# Patient Record
Sex: Female | Born: 1955 | Race: White | Hispanic: Yes | Smoking: Never smoker
Health system: Southern US, Community
[De-identification: ages and names within clinical notes are randomized; demographics above are authoritative.]

## PROBLEM LIST (undated history)

## (undated) DIAGNOSIS — R112 Nausea with vomiting, unspecified: Secondary | ICD-10-CM

## (undated) DIAGNOSIS — A419 Sepsis, unspecified organism: Secondary | ICD-10-CM

## (undated) DIAGNOSIS — E119 Type 2 diabetes mellitus without complications: Secondary | ICD-10-CM

## (undated) DIAGNOSIS — T7840XA Allergy, unspecified, initial encounter: Secondary | ICD-10-CM

## (undated) DIAGNOSIS — C50919 Malignant neoplasm of unspecified site of unspecified female breast: Secondary | ICD-10-CM

## (undated) DIAGNOSIS — Z9289 Personal history of other medical treatment: Secondary | ICD-10-CM

## (undated) DIAGNOSIS — K4090 Unilateral inguinal hernia, without obstruction or gangrene, not specified as recurrent: Secondary | ICD-10-CM

## (undated) DIAGNOSIS — F419 Anxiety disorder, unspecified: Secondary | ICD-10-CM

## (undated) DIAGNOSIS — K5792 Diverticulitis of intestine, part unspecified, without perforation or abscess without bleeding: Secondary | ICD-10-CM

## (undated) DIAGNOSIS — M1990 Unspecified osteoarthritis, unspecified site: Secondary | ICD-10-CM

## (undated) DIAGNOSIS — R6521 Severe sepsis with septic shock: Secondary | ICD-10-CM

## (undated) DIAGNOSIS — Z9889 Other specified postprocedural states: Secondary | ICD-10-CM

## (undated) DIAGNOSIS — D649 Anemia, unspecified: Secondary | ICD-10-CM

## (undated) DIAGNOSIS — Z973 Presence of spectacles and contact lenses: Secondary | ICD-10-CM

## (undated) HISTORY — DX: Allergy, unspecified, initial encounter: T78.40XA

## (undated) HISTORY — DX: Sepsis, unspecified organism: A41.9

## (undated) HISTORY — DX: Malignant neoplasm of unspecified site of unspecified female breast: C50.919

## (undated) HISTORY — DX: Unilateral inguinal hernia, without obstruction or gangrene, not specified as recurrent: K40.90

## (undated) HISTORY — DX: Diverticulitis of intestine, part unspecified, without perforation or abscess without bleeding: K57.92

## (undated) HISTORY — DX: Severe sepsis with septic shock: R65.21

## (undated) HISTORY — PX: PORT-A-CATH REMOVAL: SHX5289

---

## 2002-12-23 HISTORY — PX: COLON RESECTION: SHX5231

## 2003-12-24 HISTORY — PX: CHOLECYSTECTOMY: SHX55

## 2003-12-24 HISTORY — PX: HERNIA REPAIR: SHX51

## 2013-12-16 DIAGNOSIS — C50919 Malignant neoplasm of unspecified site of unspecified female breast: Secondary | ICD-10-CM

## 2013-12-16 HISTORY — DX: Malignant neoplasm of unspecified site of unspecified female breast: C50.919

## 2013-12-23 HISTORY — PX: AXILLARY LYMPH NODE BIOPSY: SHX5737

## 2013-12-23 HISTORY — PX: BREAST BIOPSY: SHX20

## 2014-01-03 ENCOUNTER — Other Ambulatory Visit: Payer: Self-pay | Admitting: Oncology

## 2014-01-06 ENCOUNTER — Telehealth: Payer: Self-pay | Admitting: *Deleted

## 2014-01-06 ENCOUNTER — Encounter: Payer: Self-pay | Admitting: *Deleted

## 2014-01-06 NOTE — Telephone Encounter (Signed)
Received appt date and time from Dr. Humphrey Rolls.  Called and confirmed 01/10/14 appt w/ pt.  Mailed before appt letter, welcome packet & intake form to pt.  Called Regina at referring to make her aware, but office was already closed so I will call her to make her aware in the am.  Took paperwork to Med Rec for chart.

## 2014-01-06 NOTE — Progress Notes (Signed)
Received a call from [facility] at referring requesting an urgent appt for this pt.  She informed me that Tiffany had the paperwork . Went to [employer] and retrieved paperwork and went over it with [employer].  Took paperwork to Dr. Humphrey Rolls for an appt date and time.  Called Rollene Fare back to make her aware that I would call her with an update once I have received appt info from physician and I have confirmed the appt w/ the pt.

## 2014-01-07 ENCOUNTER — Other Ambulatory Visit: Payer: Self-pay | Admitting: Emergency Medicine

## 2014-01-07 ENCOUNTER — Encounter: Payer: Self-pay | Admitting: *Deleted

## 2014-01-07 DIAGNOSIS — C50919 Malignant neoplasm of unspecified site of unspecified female breast: Secondary | ICD-10-CM

## 2014-01-07 NOTE — Progress Notes (Signed)
Received chart back from Dawn and placed in Dr. Khan's box. 

## 2014-01-07 NOTE — Progress Notes (Signed)
Called referring facility and requested office note to be faxed.  Placed a note on the chart.  Added to Spreadsheet.  Completed chart and gave to [facility] to enter labs and return to me.

## 2014-01-10 ENCOUNTER — Ambulatory Visit: Payer: BC Managed Care – PPO

## 2014-01-10 ENCOUNTER — Telehealth: Payer: Self-pay | Admitting: Oncology

## 2014-01-10 ENCOUNTER — Encounter: Payer: Self-pay | Admitting: Oncology

## 2014-01-10 ENCOUNTER — Other Ambulatory Visit (HOSPITAL_BASED_OUTPATIENT_CLINIC_OR_DEPARTMENT_OTHER): Payer: BC Managed Care – PPO

## 2014-01-10 ENCOUNTER — Ambulatory Visit (HOSPITAL_BASED_OUTPATIENT_CLINIC_OR_DEPARTMENT_OTHER): Payer: BC Managed Care – PPO | Admitting: Oncology

## 2014-01-10 ENCOUNTER — Encounter (INDEPENDENT_AMBULATORY_CARE_PROVIDER_SITE_OTHER): Payer: Self-pay

## 2014-01-10 VITALS — BP 138/86 | HR 99 | Temp 98.6°F | Resp 18 | Ht 64.0 in | Wt 209.1 lb

## 2014-01-10 DIAGNOSIS — M549 Dorsalgia, unspecified: Secondary | ICD-10-CM

## 2014-01-10 DIAGNOSIS — I10 Essential (primary) hypertension: Secondary | ICD-10-CM | POA: Insufficient documentation

## 2014-01-10 DIAGNOSIS — Z170 Estrogen receptor positive status [ER+]: Secondary | ICD-10-CM

## 2014-01-10 DIAGNOSIS — C773 Secondary and unspecified malignant neoplasm of axilla and upper limb lymph nodes: Secondary | ICD-10-CM

## 2014-01-10 DIAGNOSIS — C50919 Malignant neoplasm of unspecified site of unspecified female breast: Secondary | ICD-10-CM

## 2014-01-10 DIAGNOSIS — C50519 Malignant neoplasm of lower-outer quadrant of unspecified female breast: Secondary | ICD-10-CM

## 2014-01-10 DIAGNOSIS — G8929 Other chronic pain: Secondary | ICD-10-CM

## 2014-01-10 LAB — COMPREHENSIVE METABOLIC PANEL (CC13)
ALT: 17 U/L (ref 0–55)
AST: 15 U/L (ref 5–34)
Albumin: 3.7 g/dL (ref 3.5–5.0)
Alkaline Phosphatase: 95 U/L (ref 40–150)
Anion Gap: 13 mEq/L — ABNORMAL HIGH (ref 3–11)
BUN: 18.2 mg/dL (ref 7.0–26.0)
CALCIUM: 9.9 mg/dL (ref 8.4–10.4)
CHLORIDE: 103 meq/L (ref 98–109)
CO2: 27 mEq/L (ref 22–29)
CREATININE: 0.8 mg/dL (ref 0.6–1.1)
Glucose: 107 mg/dl (ref 70–140)
Potassium: 3.5 mEq/L (ref 3.5–5.1)
Sodium: 142 mEq/L (ref 136–145)
Total Bilirubin: 0.37 mg/dL (ref 0.20–1.20)
Total Protein: 7.5 g/dL (ref 6.4–8.3)

## 2014-01-10 LAB — CBC WITH DIFFERENTIAL/PLATELET
BASO%: 0.7 % (ref 0.0–2.0)
BASOS ABS: 0.1 10*3/uL (ref 0.0–0.1)
EOS%: 5.4 % (ref 0.0–7.0)
Eosinophils Absolute: 0.5 10*3/uL (ref 0.0–0.5)
HEMATOCRIT: 41 % (ref 34.8–46.6)
HEMOGLOBIN: 13.6 g/dL (ref 11.6–15.9)
LYMPH#: 1.3 10*3/uL (ref 0.9–3.3)
LYMPH%: 13 % — ABNORMAL LOW (ref 14.0–49.7)
MCH: 28.5 pg (ref 25.1–34.0)
MCHC: 33.2 g/dL (ref 31.5–36.0)
MCV: 86 fL (ref 79.5–101.0)
MONO#: 0.5 10*3/uL (ref 0.1–0.9)
MONO%: 4.5 % (ref 0.0–14.0)
NEUT#: 7.7 10*3/uL — ABNORMAL HIGH (ref 1.5–6.5)
NEUT%: 76.4 % (ref 38.4–76.8)
Platelets: 294 10*3/uL (ref 145–400)
RBC: 4.77 10*6/uL (ref 3.70–5.45)
RDW: 14 % (ref 11.2–14.5)
WBC: 10.1 10*3/uL (ref 3.9–10.3)

## 2014-01-10 NOTE — Progress Notes (Signed)
Madeline Davidson [id number] 1956-05-13 58 y.o. 01/10/2014 3:24 PM  CC  Lonzo Cloud, MD [address] [place] [address] Wendy Rodriguez NP  REASON FOR CONSULTATION:  58 year old female with new diagnosis of right breast cancer in the lower outer quadrant diagnosed December 2014.   STAGE:   Breast cancer   Primary site: Breast (Right)   Staging method: AJCC 7th Edition   Clinical: Stage IIB (T2, N1, cM0) signed by [name], MD on 01/10/2014  5:01 PM   Summary: Stage IIB (T2, N1, cM0)   REFERRING PHYSICIAN: Dr. Lonzo Cloud  HISTORY OF PRESENT ILLNESS:  Madeline Davidson is a 58 y.o. female.  Was last normal mammogram was back in 2005. Patient on 12/16/2013 felt a lump in her right breast. She states it was nickel size. Because of this she 1 on to see her nurse practitioner. On 12/31/2013 a mammogram was ordered. This showed a lesion in the 7:00 position measuring 3.3 cm. She was also noted to have enlarged lymph nodes. Subsequent ultrasound showed a 2.2 x 2.4 cm mass with enlarged lymph node measuring 1.6 cm. This was biopsied in [place]. 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 Lymph node was positive for metastatic disease. Prognostic markers revealed the tumor to be estrogen receptor +90% progesterone receptor 1% HER-2/neu positive. Patient subsequently was referred to medical oncology for further treatment planning. Patient is complaining of having tenderness at the biopsy site. Otherwise no complaints.   Past Medical History: Past Medical History  Diagnosis Date  . Breast cancer 12/16/13    self palpated  . Hypertension   . Allergy   . Hypertension 01/10/2014    Past Surgical History: Past Surgical History  Procedure Laterality Date  . Cholecystectomy    . Hernia repair    . Colon resection      Family History: Family History  Problem Relation Age of Onset  . Cancer Father 80    colon cancer    Social History History  Substance  Use Topics  . Smoking status: Never Smoker   . Smokeless tobacco: Never Used  . Alcohol Use: No    Allergies: No known drug allergy Not on File  Current Medications: Current outpatient prescriptions:cetirizine (ZYRTEC) 10 MG tablet, Take 10 mg by mouth daily., Disp: , Rfl: ;  clonazePAM (KLONOPIN) 0.5 MG tablet, Take 0.5 mg by mouth as needed for anxiety (take half a tablet as needed)., Disp: , Rfl: ;  ibuprofen (ADVIL,MOTRIN) 800 MG tablet, Take 800 mg by mouth as needed., Disp: , Rfl: ;  lisinopril-hydrochlorothiazide (PRINZIDE,ZESTORETIC) 10-12.5 MG per tablet, Take 1 tablet by mouth daily., Disp: , Rfl:  zolpidem (AMBIEN) 10 MG tablet, Take 10 mg by mouth at bedtime as needed for sleep (take half a tablet at night)., Disp: , Rfl:    OB/GYN History: menarche at 51, post-menpause 2007, no HRT, age first 41 x 1  Fertility Discussion: N/A Prior History of Cancer: no  Health Maintenance:  Colonoscopy no Bone Density no Last PAP smear no Last normal mammo: 2005  ECOG PERFORMANCE STATUS: 0 - Asymptomatic  Genetic Counseling/testing: NO  REVIEW OF SYSTEMS:  A comprehensive review of systems was negative. Patient does have some sinus trouble she does wear glasses the lump is somewhat painful since the biopsy and she has chronic back problems appear  PHYSICAL EXAMINATION: Blood pressure 138/86, pulse 99, temperature 98.6 F (37 C), temperature source Oral, resp. rate 18, height $RemoveBe'5\' 4"'PrieSgCuU$  (1.626 m), weight 209 lb 1.6 oz (  94.847 kg).  GMW:NUUVO, healthy, no distress, well nourished and well developed SKIN: skin color, texture, turgor are normal HEAD: Normocephalic, No masses, lesions, tenderness or abnormalities, Atopic facies- EYES: PERRLA, EOMI, Conjunctiva are pink and non-injected, sclera clear EARS: External ears normal OROPHARYNX:no exudate, no erythema and lips, buccal mucosa, and tongue normal  NECK: supple, no adenopathy LYMPH:  no palpable lymphadenopathy, no  hepatosplenomegaly BREAST:left breast normal without mass, skin or nipple changes or axillary nodes,  abnormal mass palpable in the lower outer quadrant of the right breast. LUNGS: clear to auscultation and percussion HEART: regular rate & rhythm ABDOMEN:abdomen soft, non-tender, normal bowel sounds and no masses or organomegaly BACK: Back symmetric, no curvature., No CVA tenderness EXTREMITIES:no edema, no clubbing, no cyanosis  NEURO: alert & oriented x 3 with fluent speech, no focal motor/sensory deficits, gait normal     STUDIES/RESULTS: No results found.   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 Mammogram and ultrasound confirmed the mass to be 3.3 cm. The node was positive on biopsy in the right axilla. Tumor was ER positive PR positive HER-2/neu positive, grade 3. Patient's biopsy was performed in [place] and we will need to review her slides. We will ask for slides from [place].  #2 patient and I discussed in detail her pathology and radiology today. We discussed the pathophysiology of breast cancer. We discussed different treatment options including lumpectomy versus mastectomy. We discussed systemic treatment either neoadjuvant or adjuvant. I do think that the patient would be a good candidate for neoadjuvant chemotherapy and anti-HER-2 therapy. We certainly could give her [facility] with Perjeta in in the neoadjuvant setting. We discussed the rationale for this. We discussed risks benefits and side effects of chemotherapy.  #3 patient has not been seen by  surgery or radiation oncology we will make referrals for this. Patient will also need MRI of the breasts for treatment planning. She does need staging studies including PET CT to look for any evidence of distant recurrence since she is at risk for early metastasis.  #4 because patient will be receiving anti-HER-2 therapy she will need an echocardiogram and referral to cardiology. I will make these appointments for her.    PLAN:    #1 MRI of bilateral breasts, staging PET/CT  #2 surgical and radiation oncology consultation  #3 pathology review.  #4 chemotherapy class echocardiogram and cardiology consultation he eventually prior to beginning treatment       Discussion: Patient is being treated per NCCN breast cancer care guidelines appropriate for stage.II   Thank you so much for allowing me to participate in the care of [facility]. I will continue to follow up the patient with you and assist in her care.  All questions were answered. The patient knows to call the clinic with any problems, questions or concerns. We can certainly see the patient much sooner if necessary.  I spent 60 minutes counseling the patient face to face. The total time spent in the appointment was  60 minutes.  Marcy Panning, MD Medical/Oncology [facility] [phone] (beeper) [phone] (Office)  01/10/2014, 3:25 PM

## 2014-01-10 NOTE — Progress Notes (Signed)
Checked in new pt with no financial concerns. °

## 2014-01-14 ENCOUNTER — Encounter (HOSPITAL_COMMUNITY): Payer: Self-pay

## 2014-01-17 ENCOUNTER — Telehealth: Payer: Self-pay | Admitting: *Deleted

## 2014-01-17 ENCOUNTER — Telehealth (INDEPENDENT_AMBULATORY_CARE_PROVIDER_SITE_OTHER): Payer: Self-pay

## 2014-01-17 ENCOUNTER — Telehealth: Payer: Self-pay | Admitting: Oncology

## 2014-01-17 NOTE — Telephone Encounter (Signed)
Faxed pt medical records to Dr. Raynelle Jan

## 2014-01-17 NOTE — Telephone Encounter (Signed)
LMOM for pt to call me back b/c Dr Donne Hazel is having to cancel his office for 01/21/14 so I r/s her appt from 1/30 to 1/29 arrive at 2:00/2:30. I tried calling pt at work but she did not work today. I need to give the pt this information about the appt change. Dr Donne Hazel is having to do surgery now on 1/30 instead of an office.

## 2014-01-17 NOTE — Telephone Encounter (Signed)
, °

## 2014-01-17 NOTE — Telephone Encounter (Signed)
Faxed Care Plan to the PCP.  Took care plan to med rec to scan.

## 2014-01-20 ENCOUNTER — Ambulatory Visit (HOSPITAL_COMMUNITY)
Admission: RE | Admit: 2014-01-20 | Discharge: 2014-01-20 | Disposition: A | Discharge status: Home / Self Care | Payer: BC Managed Care – PPO | Source: Ambulatory Visit | Attending: Oncology | Admitting: Oncology

## 2014-01-20 ENCOUNTER — Encounter (INDEPENDENT_AMBULATORY_CARE_PROVIDER_SITE_OTHER): Payer: Self-pay | Admitting: General Surgery

## 2014-01-20 ENCOUNTER — Encounter (HOSPITAL_COMMUNITY)
Admission: RE | Admit: 2014-01-20 | Discharge: 2014-01-20 | Disposition: A | Discharge status: Home / Self Care | Payer: BC Managed Care – PPO | Source: Ambulatory Visit | Attending: Oncology | Admitting: Oncology

## 2014-01-20 ENCOUNTER — Encounter: Payer: Self-pay | Admitting: Radiation Oncology

## 2014-01-20 ENCOUNTER — Ambulatory Visit (INDEPENDENT_AMBULATORY_CARE_PROVIDER_SITE_OTHER): Payer: BC Managed Care – PPO | Admitting: General Surgery

## 2014-01-20 ENCOUNTER — Encounter (HOSPITAL_COMMUNITY): Payer: Self-pay

## 2014-01-20 VITALS — BP 126/78 | HR 96 | Temp 98.4°F | Resp 14 | Ht 64.0 in | Wt 206.8 lb

## 2014-01-20 DIAGNOSIS — C778 Secondary and unspecified malignant neoplasm of lymph nodes of multiple regions: Secondary | ICD-10-CM | POA: Insufficient documentation

## 2014-01-20 DIAGNOSIS — C50919 Malignant neoplasm of unspecified site of unspecified female breast: Secondary | ICD-10-CM | POA: Insufficient documentation

## 2014-01-20 DIAGNOSIS — K439 Ventral hernia without obstruction or gangrene: Secondary | ICD-10-CM | POA: Insufficient documentation

## 2014-01-20 LAB — GLUCOSE, CAPILLARY: Glucose-Capillary: 99 mg/dL (ref 70–99)

## 2014-01-20 MED ORDER — IOHEXOL 300 MG/ML  SOLN
100.0000 mL | Freq: Once | INTRAMUSCULAR | Status: AC | PRN
  Administered 2014-01-20: 100 mL via INTRAVENOUS

## 2014-01-20 MED ORDER — FLUDEOXYGLUCOSE F - 18 (FDG) INJECTION
17.6000 | Freq: Once | INTRAVENOUS | Status: AC | PRN
  Administered 2014-01-20: 17.6 via INTRAVENOUS

## 2014-01-20 NOTE — Progress Notes (Signed)
Patient ID: Madeline Davidson, female   DOB: [birth year], 59 y.o.   MRN: [id number]  Chief Complaint  Patient presents with  . New Evaluation    New Rt br cancer    HPI Madeline Davidson is a 58 y.o. female.  Referred by Dr Marcy Panning HPI 69 yof who lives in [place] and presents after noting a right breast mass around Christmas.  She underwent mm that showed a 3.3 cm lesion in the right breast at 7 oclock.  She also had enlarged right axillary node that was biopsied.  An ultrasound showed a 2.2x2.4 cm mass with a node measuring 1.6 cm   The pathology showed an invasive ductal carcinoma with metastatic carcinoma in the node.  This is er positive at 90%, pr at 1%, her2 is amplified. She comes in after seeing med onc for surgical evaluation.  Past Medical History  Diagnosis Date  . Breast cancer 12/16/13    self palpated  . Hypertension   . Allergy   . Hypertension 01/10/2014    Past Surgical History  Procedure Laterality Date  . Cholecystectomy    . Hernia repair    . Colon resection      Family History  Problem Relation Age of Onset  . Cancer Father 96    colon cancer  . Hypertension Mother     Social History History  Substance Use Topics  . Smoking status: Never Smoker   . Smokeless tobacco: Never Used  . Alcohol Use: No    Allergies  Allergen Reactions  . Ciprofloxacin Other (See Comments)    Patient had a headache after taking Cipro. Made her sinus infection symptoms worse.    Current Outpatient Prescriptions  Medication Sig Dispense Refill  . cetirizine (ZYRTEC) 10 MG tablet Take 10 mg by mouth daily.      . clonazePAM (KLONOPIN) 0.5 MG tablet Take 0.5 mg by mouth as needed for anxiety (take half a tablet as needed).      Marland Kitchen ibuprofen (ADVIL,MOTRIN) 800 MG tablet Take 800 mg by mouth as needed.      Marland Kitchen lisinopril-hydrochlorothiazide (PRINZIDE,ZESTORETIC) 10-12.5 MG per tablet Take 1 tablet by mouth daily.      Marland Kitchen zolpidem (AMBIEN) 10 MG tablet Take 10 mg by  mouth at bedtime as needed for sleep (take half a tablet at night).       No current facility-administered medications for this visit.    Review of Systems Review of Systems  Constitutional: Negative for fever, chills and unexpected weight change.  HENT: Negative for congestion, hearing loss, sore throat, trouble swallowing and voice change.   Eyes: Negative for visual disturbance.  Respiratory: Negative for cough and wheezing.   Cardiovascular: Negative for chest pain, palpitations and leg swelling.  Gastrointestinal: Negative for nausea, vomiting, abdominal pain, diarrhea, constipation, blood in stool, abdominal distention and anal bleeding.  Genitourinary: Negative for hematuria, vaginal bleeding and difficulty urinating.  Musculoskeletal: Negative for arthralgias.  Skin: Negative for rash and wound.  Neurological: Negative for seizures, syncope and headaches.  Hematological: Negative for adenopathy. Does not bruise/bleed easily.  Psychiatric/Behavioral: Negative for confusion.    Blood pressure 126/78, pulse 96, temperature 98.4 F (36.9 C), temperature source Temporal, resp. rate 14, height $RemoveBe'5\' 4"'kGMJkOnYg$  (1.626 m), weight 206 lb 12.8 oz (93.804 kg).  Physical Exam Physical Exam  Vitals reviewed. Constitutional: She appears well-developed and well-nourished.  Neck: Neck supple.  Cardiovascular: Normal rate, regular rhythm and normal heart sounds.   Pulmonary/Chest: Effort normal and breath  sounds normal. She has no wheezes. She has no rales. Right breast exhibits mass. Right breast exhibits no inverted nipple, no nipple discharge, no skin change and no tenderness. Left breast exhibits no inverted nipple, no mass, no nipple discharge, no skin change and no tenderness.    Abdominal: Soft. A hernia is present. Hernia confirmed positive in the ventral area.    Lymphadenopathy:    She has no cervical adenopathy.    She has axillary adenopathy.       Right axillary: Lateral adenopathy  present.       Right: No supraclavicular adenopathy present.       Left: No supraclavicular adenopathy present.    Data Reviewed Path, mm, [place] reviewed  Assessment    Clinical stage II right breast cancer     Plan    Staging studies and MRI pending as of now. We discussed the staging and pathophysiology of breast cancer. We discussed all of the different options for treatment for breast cancer including surgery, chemotherapy, radiation therapy, Herceptin, and antiestrogen therapy.  I think beginning with primary systemic therapy will improve chances of breast conservation therapy and may be able to enroll in node study in patients undergoing neoadjuvant therapy.  She certainly needs staging studies first to see if this is curable disease also. We discussed lumpectomy/mastectomy.  Will await mr and staging studies. I will plan on placing a port and we discussed performance of this and risks associated with it.       Madeline Davidson 01/20/2014, 3:26 PM

## 2014-01-21 ENCOUNTER — Ambulatory Visit
Admission: RE | Admit: 2014-01-21 | Discharge: 2014-01-21 | Disposition: A | Discharge status: Home / Self Care | Payer: BC Managed Care – PPO | Source: Ambulatory Visit | Attending: Radiation Oncology | Admitting: Radiation Oncology

## 2014-01-21 ENCOUNTER — Encounter (INDEPENDENT_AMBULATORY_CARE_PROVIDER_SITE_OTHER): Payer: Self-pay | Admitting: General Surgery

## 2014-01-21 DIAGNOSIS — C50919 Malignant neoplasm of unspecified site of unspecified female breast: Secondary | ICD-10-CM

## 2014-01-21 NOTE — Progress Notes (Signed)
Radiation Oncology         [phone]) [phone] ________________________________  Initial outpatient Consultation - Date: 01/21/2014   Name: Madeline Davidson MRN: [id number]   DOB: 02-01-1956  REFERRING PHYSICIAN: [name], MD  DIAGNOSIS: No diagnosis found.  STAGE: Breast cancer   Primary site: Breast (Right)   Staging method: AJCC 7th Edition   Clinical: Stage IIB (T2, N1, cM0) signed by [name], MD on 01/10/2014  5:01 PM   Summary: Stage IIB (T2, N1, cM0)   Prognostic indicators: ER/PR/HER2NEU +   HISTORY OF PRESENT ILLNESS::Madeline Davidson is a 58 y.o. female  who palpated a right breast mass after noticing her right arm with sore.. She brought this to the attention of her primary care physician who ordered a mammogram. It a bit about 5 years since her previous mammogram. This showed a spiculated lesion at the 7:00 position of the right breast. This measured about 3.3 cm. Enlarged lymph nodes were also noted in the upper outer quadrant of the breast. The largest lymph node measured 19 mm. Asymmetry was seen within the left breast centrally. Ultrasound showed a hypoechoic lesion at the 7:00 position measuring 2.4 x 2.4 cm with several enlarged lymph nodes in the upper outer quadrant the largest measuring 1.6 cm. 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 The outside report showed the tumor positive for estrogen at 90% and progesterone 1%. She was also HER-2 positive. As far as I can see no further workup the left breast was performed. A CT and PET have been ordered by Dr. Humphrey Rolls and those results are pending. She has been scheduled for an MRI of the bilateral breasts and port placement. Dr. Donne Hazel has discussed neoadjuvant chemotherapy with her as has Dr. Humphrey Rolls. She is present with her daughter today. She reports some soreness in her right breast especially when she wakes up in the morning.  She reports no Donald pain tenderness nausea or headaches. She has no personal history of breast cancer. She is GX P1 with menarche at 44 and is postmenopausal. She still has her uterus and ovaries. She has not been on hormone replacement.  PREVIOUS RADIATION THERAPY: No  PAST MEDICAL HISTORY:  has a past medical history of Breast cancer (12/16/13); Hypertension; Allergy; and Hypertension (01/10/2014).    PAST SURGICAL HISTORY: Past Surgical History  Procedure Laterality Date  . Cholecystectomy    . Hernia repair    . Colon resection      FAMILY HISTORY:  Family History  Problem Relation Age of Onset  . Cancer Father 42    colon cancer  . Hypertension Mother     SOCIAL HISTORY:  History  Substance Use Topics  . Smoking status: Never Smoker   . Smokeless tobacco: Never Used  . Alcohol Use: No    ALLERGIES: Ciprofloxacin  MEDICATIONS:  Current Outpatient Prescriptions  Medication Sig Dispense Refill  . cetirizine (ZYRTEC) 10 MG tablet Take 10 mg by mouth daily.      . clonazePAM (KLONOPIN) 0.5 MG tablet Take 0.5 mg by mouth as needed for anxiety (take half a tablet as needed).      Marland Kitchen ibuprofen (ADVIL,MOTRIN) 800 MG tablet Take 800 mg by mouth as needed.      Marland Kitchen lisinopril-hydrochlorothiazide (PRINZIDE,ZESTORETIC) 10-12.5 MG per tablet Take 1 tablet by mouth daily.      Marland Kitchen zolpidem (AMBIEN) 10 MG tablet Take 10 mg by mouth  at bedtime as needed for sleep (take half a tablet at night).       No current facility-administered medications for this encounter.    REVIEW OF SYSTEMS:  A 15 point review of systems is documented in the electronic medical record. This was obtained by the nursing staff. However, I reviewed this with the patient to discuss relevant findings and make appropriate changes.  Pertinent items are noted in HPI.  PHYSICAL EXAM: There were no vitals filed for this visit.. . She has a palpable right breast mass in the outer quadrant in the lower aspect of the right  breast. She has large pendulous breasts bilaterally. I'm not really able to palpate any right lymph nodes. Her left breast has no abnormalities in no palpable left axillary adenopathy. No palpable cervical or supraclavicular adenopathy. Cranial nurse 3 through 12 are tested and intact. She is alert and oriented x3. She has good range of motion and strength in her bilateral extremities.  LABORATORY DATA:  Lab Results  Component Value Date   WBC 10.1 01/10/2014   HGB 13.6 01/10/2014   HCT 41.0 01/10/2014   MCV 86.0 01/10/2014   PLT 294 01/10/2014   Lab Results  Component Value Date   NA 142 01/10/2014   K 3.5 01/10/2014   CO2 27 01/10/2014   Lab Results  Component Value Date   ALT 17 01/10/2014   AST 15 01/10/2014   ALKPHOS 95 01/10/2014   BILITOT 0.37 01/10/2014     RADIOGRAPHY: Ct Chest W Contrast  01/20/2014   CLINICAL DATA:  Initial staging of breast cancer. Node positive right breast cancer.  EXAM: CT CHEST, ABDOMEN, AND PELVIS WITH CONTRAST  TECHNIQUE: Multidetector CT imaging of the chest, abdomen and pelvis was performed following the standard protocol during bolus administration of intravenous contrast.  CONTRAST:  112mL OMNIPAQUE IOHEXOL 300 MG/ML  SOLN  COMPARISON:  NM PET IMAGE INITIAL (PI) SKULL BASE TO THIGH dated 01/20/2014  FINDINGS: CT CHEST FINDINGS  There is a mass within the inferior lateral right breast measuring 2.5 cm. More superiorly there 2 adjacent masses versus lymph nodes in the posterior aspect of the right breast measuring 19 and 18 mm. Small sub pectoral lymph node measures 5 mm.  No infraclavicular or internal mammary lymphadenopathy. No mediastinal or hilar lymphadenopathy. No central pulmonary embolism. No pericardial fluid.  View of the lung parenchyma demonstrates no pulmonary nodules.  CT ABDOMEN AND PELVIS FINDINGS  No focal hepatic lesion. Pneumobilia within the left hepatic lobe suggesting prior sphincterotomy. There is enlarged periportal lymph node measuring 21 mm  (image 66, series 2). Smaller lymph node measuring 8 mm (image 62) is not pathologic by size criteria. The pancreas, spleen, adrenal glands, and kidneys are normal.  The stomach, small bowel, and colon demonstrate no evidence of obstruction or mass. There are at least 3 ventral hernias. The medial hernia sacs contains the the terminal ileum and cecum. The lateral hernia sac contains a long segment of small bowel. The more superior hernia sac contains a short segment of transverse colon  Abdominal aorta is normal caliber. No retroperitoneal periportal lymphadenopathy. . No free fluid the pelvis. The uterus, ovaries, and bladder normal. No pelvic lymphadenopathy. No aggressive osseous lesion.  IMPRESSION: 1. Mass in the posterior lateral right breast consists with primary carcinoma. 2. Two  additional right breast lesions versus nodal metastasis. 3. No evidence of central nodal metastasis or mediastinal nodal metastasis. 4. Single enlarged periportal lymph node. Of note this lymph node  was hypermetabolic on comparison PET-CT scan. 5. Multiple ventral hernias contain nonobstructed segments of small and large bowel.   Electronically Signed   By: Suzy Bouchard M.D.   On: 01/20/2014 15:37   Ct Abdomen Pelvis W Contrast  01/20/2014   CLINICAL DATA:  Initial staging of breast cancer. Node positive right breast cancer.  EXAM: CT CHEST, ABDOMEN, AND PELVIS WITH CONTRAST  TECHNIQUE: Multidetector CT imaging of the chest, abdomen and pelvis was performed following the standard protocol during bolus administration of intravenous contrast.  CONTRAST:  182mL OMNIPAQUE IOHEXOL 300 MG/ML  SOLN  COMPARISON:  NM PET IMAGE INITIAL (PI) SKULL BASE TO THIGH dated 01/20/2014  FINDINGS: CT CHEST FINDINGS  There is a mass within the inferior lateral right breast measuring 2.5 cm. More superiorly there 2 adjacent masses versus lymph nodes in the posterior aspect of the right breast measuring 19 and 18 mm. Small sub pectoral lymph node  measures 5 mm.  No infraclavicular or internal mammary lymphadenopathy. No mediastinal or hilar lymphadenopathy. No central pulmonary embolism. No pericardial fluid.  View of the lung parenchyma demonstrates no pulmonary nodules.  CT ABDOMEN AND PELVIS FINDINGS  No focal hepatic lesion. Pneumobilia within the left hepatic lobe suggesting prior sphincterotomy. There is enlarged periportal lymph node measuring 21 mm (image 66, series 2). Smaller lymph node measuring 8 mm (image 62) is not pathologic by size criteria. The pancreas, spleen, adrenal glands, and kidneys are normal.  The stomach, small bowel, and colon demonstrate no evidence of obstruction or mass. There are at least 3 ventral hernias. The medial hernia sacs contains the the terminal ileum and cecum. The lateral hernia sac contains a long segment of small bowel. The more superior hernia sac contains a short segment of transverse colon  Abdominal aorta is normal caliber. No retroperitoneal periportal lymphadenopathy. . No free fluid the pelvis. The uterus, ovaries, and bladder normal. No pelvic lymphadenopathy. No aggressive osseous lesion.  IMPRESSION: 1. Mass in the posterior lateral right breast consists with primary carcinoma. 2. Two  additional right breast lesions versus nodal metastasis. 3. No evidence of central nodal metastasis or mediastinal nodal metastasis. 4. Single enlarged periportal lymph node. Of note this lymph node was hypermetabolic on comparison PET-CT scan. 5. Multiple ventral hernias contain nonobstructed segments of small and large bowel.   Electronically Signed   By: Suzy Bouchard M.D.   On: 01/20/2014 15:37   Nm Pet Image Initial (pi) Skull Base To Thigh  01/20/2014   CLINICAL DATA:  Initial treatment strategy for breast carcinoma.  EXAM: NUCLEAR MEDICINE PET SKULL BASE TO THIGH  FASTING BLOOD GLUCOSE:  Value: $Remov'99mg'fOdDXT$ /dl  TECHNIQUE: 17.6 mCi F-18 FDG was injected intravenously. CT data was obtained and used for attenuation  correction and anatomic localization only. (This was not acquired as a diagnostic CT examination.) Additional exam technical data entered on technologist worksheet.  COMPARISON:  None.  FINDINGS: NECK  No hypermetabolic lymph nodes in the neck.  CHEST  Hypermetabolic mass posterior right breast with SUV max 13.6 and measuring 13.2 cm (image 108). There two 12 in 16 mm hypermetabolic nodules in the posterior right breast versus lymph nodes with intense metabolic activity SUV max 10.9. No hypermetabolic supraclavicular internal mammary nodes. No hypermetabolic mediastinal lymph nodes. No suspicious pulmonary nodules.  ABDOMEN/PELVIS  There are 3 hypermetabolic periportal lymph nodes. The largest measures 20 mm with SUV max 13.6. Additional small less than 10 mm short axis nodes on image 144 and 130 also have intense  metabolic activity. No abnormal metabolic activity within the liver. No hypermetabolic abdominal pelvic lymph nodes.  SKELETON  No focal hypermetabolic activity to suggest skeletal metastasis.  IMPRESSION: 1. Hypermetabolic mass in the posterior right breast consists with primary breast carcinoma. 2. Two hypermetabolic nodules in the deep right breast either represents additional foci of breast cancers or metastatic lymph nodes. 3. No evidence of central hypermetabolic nodes or mediastinal nodes. 4. Hypermetabolic metastatic lymph nodes within the porta hepatis.   Electronically Signed   By: Suzy Bouchard M.D.   On: 01/20/2014 15:37      IMPRESSION: T2 N1 right breast cancer with an abnormality seen in the left breast workup pending  PLAN: I discussed with the patient and her daughter today the role of radiation and decreasing local failures in patients who undergo breast conservation. We discussed the process of simulation the placement tattoos. We discussed the need for 6 weeks of daily treatment as an outpatient. We discussed skin redness and fatigue as possible side effects. I will plan on  meeting with her after her chemotherapy is coming to a close. We discussed enrollment on our clinical trials looking at sentinel lymph node biopsy after neoadjuvant chemotherapy in node-positive patients. I will let her research nurses know that she is a potential candidate for this study and will ask our social workers to followup with her as well.  I spent 45 minutes  face to face with the patient and more than 50% of that time was spent in counseling and/or coordination of care.   ------------------------------------------------  Thea Silversmith, MD

## 2014-01-24 ENCOUNTER — Encounter (HOSPITAL_BASED_OUTPATIENT_CLINIC_OR_DEPARTMENT_OTHER): Payer: Self-pay | Admitting: *Deleted

## 2014-01-24 ENCOUNTER — Ambulatory Visit (HOSPITAL_COMMUNITY)
Admission: RE | Admit: 2014-01-24 | Discharge: 2014-01-24 | Disposition: A | Discharge status: Home / Self Care | Payer: BC Managed Care – PPO | Source: Ambulatory Visit | Attending: Oncology | Admitting: Oncology

## 2014-01-24 DIAGNOSIS — Z01818 Encounter for other preprocedural examination: Secondary | ICD-10-CM | POA: Insufficient documentation

## 2014-01-24 DIAGNOSIS — Z6835 Body mass index (BMI) 35.0-35.9, adult: Secondary | ICD-10-CM | POA: Insufficient documentation

## 2014-01-24 DIAGNOSIS — I519 Heart disease, unspecified: Secondary | ICD-10-CM

## 2014-01-24 DIAGNOSIS — C50919 Malignant neoplasm of unspecified site of unspecified female breast: Secondary | ICD-10-CM | POA: Insufficient documentation

## 2014-01-24 DIAGNOSIS — I10 Essential (primary) hypertension: Secondary | ICD-10-CM | POA: Insufficient documentation

## 2014-01-24 NOTE — Progress Notes (Signed)
Echocardiogram 2D Echocardiogram has been performed.  Madeline Davidson 01/24/2014, 11:37 AM

## 2014-01-24 NOTE — Progress Notes (Signed)
No labs needed-will need ekg

## 2014-01-25 ENCOUNTER — Ambulatory Visit (HOSPITAL_BASED_OUTPATIENT_CLINIC_OR_DEPARTMENT_OTHER): Payer: BC Managed Care – PPO | Admitting: Oncology

## 2014-01-25 ENCOUNTER — Encounter: Payer: Self-pay | Admitting: Oncology

## 2014-01-25 ENCOUNTER — Other Ambulatory Visit: Payer: Self-pay | Admitting: *Deleted

## 2014-01-25 ENCOUNTER — Ambulatory Visit
Admission: RE | Admit: 2014-01-25 | Discharge: 2014-01-25 | Disposition: A | Discharge status: Home / Self Care | Payer: BC Managed Care – PPO | Source: Ambulatory Visit | Attending: Oncology | Admitting: Oncology

## 2014-01-25 ENCOUNTER — Telehealth: Payer: Self-pay | Admitting: *Deleted

## 2014-01-25 ENCOUNTER — Other Ambulatory Visit (HOSPITAL_BASED_OUTPATIENT_CLINIC_OR_DEPARTMENT_OTHER): Payer: BC Managed Care – PPO

## 2014-01-25 VITALS — BP 149/84 | HR 96 | Temp 98.2°F | Resp 18 | Ht 64.0 in | Wt 207.7 lb

## 2014-01-25 DIAGNOSIS — C773 Secondary and unspecified malignant neoplasm of axilla and upper limb lymph nodes: Secondary | ICD-10-CM

## 2014-01-25 DIAGNOSIS — C50919 Malignant neoplasm of unspecified site of unspecified female breast: Secondary | ICD-10-CM

## 2014-01-25 DIAGNOSIS — Z170 Estrogen receptor positive status [ER+]: Secondary | ICD-10-CM

## 2014-01-25 DIAGNOSIS — C50519 Malignant neoplasm of lower-outer quadrant of unspecified female breast: Secondary | ICD-10-CM

## 2014-01-25 LAB — CBC WITH DIFFERENTIAL/PLATELET
BASO%: 1 % (ref 0.0–2.0)
Basophils Absolute: 0.1 10*3/uL (ref 0.0–0.1)
EOS%: 2.4 % (ref 0.0–7.0)
Eosinophils Absolute: 0.3 10*3/uL (ref 0.0–0.5)
HCT: 41.6 % (ref 34.8–46.6)
HGB: 14.2 g/dL (ref 11.6–15.9)
LYMPH%: 15.4 % (ref 14.0–49.7)
MCH: 29.2 pg (ref 25.1–34.0)
MCHC: 34 g/dL (ref 31.5–36.0)
MCV: 86 fL (ref 79.5–101.0)
MONO#: 0.7 10*3/uL (ref 0.1–0.9)
MONO%: 6.7 % (ref 0.0–14.0)
NEUT#: 7.7 10*3/uL — ABNORMAL HIGH (ref 1.5–6.5)
NEUT%: 74.5 % (ref 38.4–76.8)
PLATELETS: 316 10*3/uL (ref 145–400)
RBC: 4.84 10*6/uL (ref 3.70–5.45)
RDW: 14 % (ref 11.2–14.5)
WBC: 10.3 10*3/uL (ref 3.9–10.3)
lymph#: 1.6 10*3/uL (ref 0.9–3.3)

## 2014-01-25 LAB — COMPREHENSIVE METABOLIC PANEL (CC13)
ALT: 20 U/L (ref 0–55)
ANION GAP: 8 meq/L (ref 3–11)
AST: 20 U/L (ref 5–34)
Albumin: 3.8 g/dL (ref 3.5–5.0)
Alkaline Phosphatase: 92 U/L (ref 40–150)
BILIRUBIN TOTAL: 0.33 mg/dL (ref 0.20–1.20)
BUN: 17 mg/dL (ref 7.0–26.0)
CO2: 29 mEq/L (ref 22–29)
CREATININE: 0.8 mg/dL (ref 0.6–1.1)
Calcium: 10.2 mg/dL (ref 8.4–10.4)
Chloride: 104 mEq/L (ref 98–109)
Glucose: 97 mg/dl (ref 70–140)
Potassium: 4.5 mEq/L (ref 3.5–5.1)
Sodium: 141 mEq/L (ref 136–145)
Total Protein: 7.5 g/dL (ref 6.4–8.3)

## 2014-01-25 MED ORDER — DEXAMETHASONE 4 MG PO TABS: 8.0000 mg | ORAL_TABLET | Freq: Two times a day (BID) | ORAL | Status: DC

## 2014-01-25 MED ORDER — LORAZEPAM 0.5 MG PO TABS: 0.5000 mg | ORAL_TABLET | Freq: Four times a day (QID) | ORAL | Status: DC | PRN

## 2014-01-25 MED ORDER — PROCHLORPERAZINE MALEATE 10 MG PO TABS: 10.0000 mg | ORAL_TABLET | Freq: Four times a day (QID) | ORAL | Status: DC | PRN

## 2014-01-25 MED ORDER — LIDOCAINE-PRILOCAINE 2.5-2.5 % EX CREA: TOPICAL_CREAM | CUTANEOUS | Status: DC | PRN

## 2014-01-25 MED ORDER — ONDANSETRON HCL 8 MG PO TABS: 8.0000 mg | ORAL_TABLET | Freq: Two times a day (BID) | ORAL | Status: DC

## 2014-01-25 NOTE — Telephone Encounter (Signed)
E-prescribing error received.  Orders resent due to  initial transmission failed.

## 2014-01-25 NOTE — Telephone Encounter (Signed)
appts made and printed. Pt is aware that tx will be added. i emailed mw TO ADD THE TX'S....TD

## 2014-01-25 NOTE — Telephone Encounter (Signed)
Per staff message and POF I have scheduled appts.  JMW  

## 2014-01-25 NOTE — Progress Notes (Signed)
OFFICE PROGRESS NOTE  CC  Madeline Amass, MD [address] [place] [address] Dr. Rolm Bookbinder Dr. Thea Silversmith  DIAGNOSIS: 58 year old female with new diagnosis of right breast cancer in the lower outer quadrant diagnosed December 2014.  STAGE:  Breast cancer  Primary site: Breast (Right)  Staging method: AJCC 7th Edition  Clinical: Stage IIB (T2, N1, cM0) signed by [name], MD on 01/10/2014 5:01 PM  Summary: Stage IIB (T2, N1, cM0)   PRIOR THERAPY: #1 Patient on 12/16/2013 felt a lump in her right breast. She states it was nickel size. Because of this she 1 on to see her nurse practitioner. On 12/31/2013 a mammogram was ordered. This showed a lesion in the 7:00 position measuring 3.3 cm. She was also noted to have enlarged lymph nodes. Subsequent ultrasound showed a 2.2 x 2.4 cm mass with enlarged lymph node measuring 1.6 cm. This was biopsied in [place]. 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 Lymph node was positive for metastatic disease. Prognostic markers revealed the tumor to be estrogen receptor +90% progesterone receptor 1% HER-2/neu positive  #2 staging PET/CT performed on 60/45/4098: Hypermetabolic mass posterior right breast with SUV max 13.6 and  measuring 13.2 cm (image 108). There two 12 in 16 mm hypermetabolic  nodules in the posterior right breast versus lymph nodes with  intense metabolic activity SUV max 10.9. No hypermetabolic  supraclavicular internal mammary nodes. No hypermetabolic  mediastinal lymph nodes. No suspicious pulmonary nodules.  ABDOMEN/PELVIS  There are 3 hypermetabolic periportal lymph nodes. The largest  measures 20 mm with SUV max 13.6. Additional small less than 10 mm  short axis nodes on image 144 and 130 also have intense metabolic  activity. No abnormal metabolic activity within the liver. No  hypermetabolic abdominal pelvic lymph nodes.  #3 patient has been seen by Dr. Rolm Bookbinder and Dr. Thea Silversmith in consultation.  CURRENT THERAPY:undergone workup  INTERVAL HISTORY: Madeline Davidson 58 y.o. female returns for followup visit to discuss her scan results. She also had her echocardiogram which was normal. Patient's PET scan does reveal hypermetabolic mass met in the right breast as well as 2 additional areas of concern. She also on abdomen was noted to have 3 hypermetabolic periportal lymph nodes largest measuring 20 mm an additional 1 measuring 10 mm. No other abnormality in the liver or or nodes. We went over the results. Patient does need further workup of the additional breast lesions noted. I have recommended a biopsy. Clinically patient seems to be doing well she does still have some tenderness in the right breast. She denies any nausea vomiting fevers chills night sweats or bleeding problems. Remainder of the 10 point review of systems is negative.  MEDICAL HISTORY: Past Medical History  Diagnosis Date  . 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 Hypertension   . Allergy   . Hypertension 01/10/2014  . Wears glasses     ALLERGIES:  is allergic to ciprofloxacin.  MEDICATIONS:  Current Outpatient Prescriptions  Medication Sig Dispense Refill  . cetirizine (ZYRTEC) 10 MG tablet Take 10 mg by mouth daily.      . clonazePAM (KLONOPIN) 0.5 MG tablet Take 0.5 mg by mouth as needed for anxiety (take half a tablet as needed).      Marland Kitchen ibuprofen (ADVIL,MOTRIN) 800 MG tablet Take 800 mg by mouth as needed.      Marland Kitchen lisinopril-hydrochlorothiazide (PRINZIDE,ZESTORETIC) 10-12.5  MG per tablet Take 1 tablet by mouth daily.      Marland Kitchen zolpidem (AMBIEN) 10 MG tablet Take 10 mg by mouth at bedtime as needed for sleep (take half a tablet at night).       No current facility-administered medications for this visit.    SURGICAL HISTORY:  Past Surgical History  Procedure Laterality Date  . Colon resection  2004  .  Hernia repair  2005    umb  . Cholecystectomy  2005    REVIEW OF SYSTEMS:  Pertinent items are noted in HPI.     PHYSICAL EXAMINATION: Blood pressure 149/84, pulse 96, temperature 98.2 F (36.8 C), temperature source Oral, resp. rate 18, height $RemoveBe'5\' 4"'WzOmZCejX$  (1.626 m), weight 207 lb 11.2 oz (94.212 kg). Body mass index is 35.63 kg/(m^2). ECOG PERFORMANCE STATUS: 0 - Asymptomatic   General appearance: alert, cooperative and appears stated age Neck: no adenopathy, no carotid bruit, no JVD, supple, symmetrical, trachea midline and thyroid not enlarged, symmetric, no tenderness/mass/nodules Lymph nodes: Cervical, supraclavicular, and axillary nodes normal. Resp: clear to auscultation bilaterally Back: symmetric, no curvature. ROM normal. No CVA tenderness. Cardio: regular rate and rhythm GI: soft, non-tender; bowel sounds normal; no masses,  no organomegaly Extremities: extremities normal, atraumatic, no cyanosis or edema Neurologic: Grossly normal Breasts: left breast normal without mass, skin or nipple changes or axillary nodes, abnormal mass palpable right breast.   LABORATORY DATA: Lab Results  Component Value Date   WBC 10.3 01/25/2014   HGB 14.2 01/25/2014   HCT 41.6 01/25/2014   MCV 86.0 01/25/2014   PLT 316 01/25/2014      Chemistry      Component Value Date/Time   NA 141 01/25/2014 1133   K 4.5 01/25/2014 1133   CO2 29 01/25/2014 1133   BUN 17.0 01/25/2014 1133   CREATININE 0.8 01/25/2014 1133      Component Value Date/Time   CALCIUM 10.2 01/25/2014 1133   ALKPHOS 92 01/25/2014 1133   AST 20 01/25/2014 1133   ALT 20 01/25/2014 1133   BILITOT 0.33 01/25/2014 1133     Diagnosis 1. Consult Slide , Right Breast Biopsy - INVASIVE DUCTAL CARCINOMA. - SEE COMMENT. 2. Consult Slide , Right lymph node - METASTATIC CARCINOMA IN 1 OF 1 LYMPH NODE (1/1). Microscopic Comment 1. The carcinoma is at least grade II. Per outside report, the tumor is positive for estrogen receptor (90% of tumor cells show  strong nuclear staining) and positive for progesterone receptor (1% of tumor cells show weak nuclear staining). The tumor cells are positive for Her 2 amplification by immunohistochemistry. (JBK:kh 01-13-14) Enid Cutter MD Pathologist, Electronic Signature (Case signed 01/13/2014) Specimen Gross and Clinical Information Specimen(s) Obtained: 1. Consult Slide , Right Breast Biopsy 2. Consult Slide , Right lymph node Specimen Clinical Information 1. (tl) 2. (tl) Gross 1. Received from [facility]:  Ct Chest W Contrast  01/20/2014   CLINICAL DATA:  Initial staging of breast cancer. Node positive right breast cancer.  EXAM: CT CHEST, ABDOMEN, AND PELVIS WITH CONTRAST  TECHNIQUE: Multidetector CT imaging of the chest, abdomen and pelvis was performed following the standard protocol during bolus administration of intravenous contrast.  CONTRAST:  143mL OMNIPAQUE IOHEXOL 300 MG/ML  SOLN  COMPARISON:  NM PET IMAGE INITIAL (PI) SKULL BASE TO THIGH dated 01/20/2014  FINDINGS: CT CHEST FINDINGS  There is a mass within the inferior lateral right breast measuring 2.5 cm. More superiorly there 2 adjacent masses versus lymph nodes  in the posterior aspect of the right breast measuring 19 and 18 mm. Small sub pectoral lymph node measures 5 mm.  No infraclavicular or internal mammary lymphadenopathy. No mediastinal or hilar lymphadenopathy. No central pulmonary embolism. No pericardial fluid.  View of the lung parenchyma demonstrates no pulmonary nodules.  CT ABDOMEN AND PELVIS FINDINGS  No focal hepatic lesion. Pneumobilia within the left hepatic lobe suggesting prior sphincterotomy. There is enlarged periportal lymph node measuring 21 mm (image 66, series 2). Smaller lymph node measuring 8 mm (image 62) is not pathologic by size criteria. The pancreas, spleen, adrenal glands, and kidneys are normal.  The stomach, small bowel, and colon demonstrate no evidence of obstruction or mass.  There are at least 3 ventral hernias. The medial hernia sacs contains the the terminal ileum and cecum. The lateral hernia sac contains a long segment of small bowel. The more superior hernia sac contains a short segment of transverse colon  Abdominal aorta is normal caliber. No retroperitoneal periportal lymphadenopathy. . No free fluid the pelvis. The uterus, ovaries, and bladder normal. No pelvic lymphadenopathy. No aggressive osseous lesion.  IMPRESSION: 1. Mass in the posterior lateral right breast consists with primary carcinoma. 2. Two  additional right breast lesions versus nodal metastasis. 3. No evidence of central nodal metastasis or mediastinal nodal metastasis. 4. Single enlarged periportal lymph node. Of note this lymph node was hypermetabolic on comparison PET-CT scan. 5. Multiple ventral hernias contain nonobstructed segments of small and large bowel.   Electronically Signed   By: Suzy Bouchard M.D.   On: 01/20/2014 15:37   Ct Abdomen Pelvis W Contrast  01/20/2014   CLINICAL DATA:  Initial staging of breast cancer. Node positive right breast cancer.  EXAM: CT CHEST, ABDOMEN, AND PELVIS WITH CONTRAST  TECHNIQUE: Multidetector CT imaging of the chest, abdomen and pelvis was performed following the standard protocol during bolus administration of intravenous contrast.  CONTRAST:  149m OMNIPAQUE IOHEXOL 300 MG/ML  SOLN  COMPARISON:  NM PET IMAGE INITIAL (PI) SKULL BASE TO THIGH dated 01/20/2014  FINDINGS: CT CHEST FINDINGS  There is a mass within the inferior lateral right breast measuring 2.5 cm. More superiorly there 2 adjacent masses versus lymph nodes in the posterior aspect of the right breast measuring 19 and 18 mm. Small sub pectoral lymph node measures 5 mm.  No infraclavicular or internal mammary lymphadenopathy. No mediastinal or hilar lymphadenopathy. No central pulmonary embolism. No pericardial fluid.  View of the lung parenchyma demonstrates no pulmonary nodules.  CT ABDOMEN AND  PELVIS FINDINGS  No focal hepatic lesion. Pneumobilia within the left hepatic lobe suggesting prior sphincterotomy. There is enlarged periportal lymph node measuring 21 mm (image 66, series 2). Smaller lymph node measuring 8 mm (image 62) is not pathologic by size criteria. The pancreas, spleen, adrenal glands, and kidneys are normal.  The stomach, small bowel, and colon demonstrate no evidence of obstruction or mass. There are at least 3 ventral hernias. The medial hernia sacs contains the the terminal ileum and cecum. The lateral hernia sac contains a long segment of small bowel. The more superior hernia sac contains a short segment of transverse colon  Abdominal aorta is normal caliber. No retroperitoneal periportal lymphadenopathy. . No free fluid the pelvis. The uterus, ovaries, and bladder normal. No pelvic lymphadenopathy. No aggressive osseous lesion.  IMPRESSION: 1. Mass in the posterior lateral right breast consists with primary carcinoma. 2. Two  additional right breast lesions versus nodal metastasis. 3. No evidence of central nodal  metastasis or mediastinal nodal metastasis. 4. Single enlarged periportal lymph node. Of note this lymph node was hypermetabolic on comparison PET-CT scan. 5. Multiple ventral hernias contain nonobstructed segments of small and large bowel.   Electronically Signed   By: Suzy Bouchard M.D.   On: 01/20/2014 15:37   Nm Pet Image Initial (pi) Skull Base To Thigh  01/20/2014   CLINICAL DATA:  Initial treatment strategy for breast carcinoma.  EXAM: NUCLEAR MEDICINE PET SKULL BASE TO THIGH  FASTING BLOOD GLUCOSE:  Value: 32m/dl  TECHNIQUE: 17.6 mCi F-18 FDG was injected intravenously. CT data was obtained and used for attenuation correction and anatomic localization only. (This was not acquired as a diagnostic CT examination.) Additional exam technical data entered on technologist worksheet.  COMPARISON:  None.  FINDINGS: NECK  No hypermetabolic lymph nodes in the neck.   CHEST  Hypermetabolic mass posterior right breast with SUV max 13.6 and measuring 13.2 cm (image 108). There two 12 in 16 mm hypermetabolic nodules in the posterior right breast versus lymph nodes with intense metabolic activity SUV max 10.9. No hypermetabolic supraclavicular internal mammary nodes. No hypermetabolic mediastinal lymph nodes. No suspicious pulmonary nodules.  ABDOMEN/PELVIS  There are 3 hypermetabolic periportal lymph nodes. The largest measures 20 mm with SUV max 13.6. Additional small less than 10 mm short axis nodes on image 144 and 130 also have intense metabolic activity. No abnormal metabolic activity within the liver. No hypermetabolic abdominal pelvic lymph nodes.  SKELETON  No focal hypermetabolic activity to suggest skeletal metastasis.  IMPRESSION: 1. Hypermetabolic mass in the posterior right breast consists with primary breast carcinoma. 2. Two hypermetabolic nodules in the deep right breast either represents additional foci of breast cancers or metastatic lymph nodes. 3. No evidence of central hypermetabolic nodes or mediastinal nodes. 4. Hypermetabolic metastatic lymph nodes within the porta hepatis.   Electronically Signed   By: SSuzy BouchardM.D.   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  #2 patient and I discussed the radiology and the implications. I have recommended proceeding with MRI of the breasts. I had ordered them previously but unfortunately they did not get done. We will attempt to get the MRI scheduled again and get biopsies of the additional lesions seen on the PET scan.  #3 patient has been seen by Dr. MRolm Bookbinderand he  is planning on putting a port in next week. My plan will be to proceed with chemotherapy the following week. We will plan on starting her on TCH/perjeta q. 21 days for total of 6 cycles with Neulasta support. We discussed risks benefits and side effects of treatment.  #4 followup: Patient will be seen back in 2 weeks' time to begin cycle 1 day 1 of chemotherapy  All questions were answered. The patient knows to call the clinic with any problems, questions or concerns. We can certainly see the patient much sooner if necessary.  I spent 25 minutes counseling the patient face to face. The total time spent in the appointment was 30 minutes.    KMarcy Panning MD Medical/Oncology [phone](beeper) [phone](Office)  01/25/2014, 12:33 PM

## 2014-01-27 ENCOUNTER — Encounter: Payer: Self-pay | Admitting: *Deleted

## 2014-01-27 NOTE — Progress Notes (Addendum)
Madeline Davidson  Clinical Social Davidson was referred by Pension scheme manager for assessment of psychosocial needs.  Clinical Social Worker phoned patient at home and left message for Madeline Davidson to phone CSW. CSW will continue to try to reach out to Madeline Davidson to further assess needs and to offer support.     Madeline Racer, LCSW Clinical Social Worker Madeline Davidson for [place] Wednesday, Thursday and Friday Phone: [phone] Fax: [phone]   Addendum: CSW received return call from Madeline Davidson. She reports to being somewhat anxious about her treatment starting next week. She reports family can help with transportation. She expressed some anxiety about her MRI and CSW discussed behavioral coping techniques. Madeline Davidson eager to know if her insurance will cover her chemo and CSW explained that process as well. CSW spent time educating Madeline Davidson on [facility] resources and supports to assist. Madeline Davidson aware CSWs can assist and agrees to seek [place] out as needed. CSW will try to check in on Madeline Davidson as she begins treatment.   Madeline Racer, LCSW Clinical Social Worker Madeline Davidson for [place] Wednesday, Thursday and Friday Phone: [phone] Fax: [phone]

## 2014-01-31 ENCOUNTER — Ambulatory Visit (HOSPITAL_COMMUNITY): Payer: BC Managed Care – PPO

## 2014-01-31 ENCOUNTER — Encounter (HOSPITAL_COMMUNITY): Payer: BC Managed Care – PPO | Admitting: Certified Registered Nurse Anesthetist

## 2014-01-31 ENCOUNTER — Ambulatory Visit (HOSPITAL_COMMUNITY): Payer: BC Managed Care – PPO | Admitting: Certified Registered Nurse Anesthetist

## 2014-01-31 ENCOUNTER — Encounter (HOSPITAL_COMMUNITY): Payer: Self-pay | Admitting: Certified Registered Nurse Anesthetist

## 2014-01-31 ENCOUNTER — Ambulatory Visit (HOSPITAL_BASED_OUTPATIENT_CLINIC_OR_DEPARTMENT_OTHER)
Admission: RE | Admit: 2014-01-31 | Discharge: 2014-01-31 | Disposition: A | Discharge status: Home / Self Care | Payer: BC Managed Care – PPO | Source: Ambulatory Visit | Attending: General Surgery | Admitting: General Surgery

## 2014-01-31 ENCOUNTER — Encounter (HOSPITAL_COMMUNITY)
Admission: RE | Disposition: A | Discharge status: Home / Self Care | Payer: Self-pay | Source: Ambulatory Visit | Attending: General Surgery

## 2014-01-31 DIAGNOSIS — C50919 Malignant neoplasm of unspecified site of unspecified female breast: Secondary | ICD-10-CM | POA: Insufficient documentation

## 2014-01-31 DIAGNOSIS — I10 Essential (primary) hypertension: Secondary | ICD-10-CM | POA: Insufficient documentation

## 2014-01-31 HISTORY — PX: PORTACATH PLACEMENT: SHX2246

## 2014-01-31 HISTORY — DX: Presence of spectacles and contact lenses: Z97.3

## 2014-01-31 LAB — BASIC METABOLIC PANEL
BUN: 18 mg/dL (ref 6–23)
CALCIUM: 9.2 mg/dL (ref 8.4–10.5)
CO2: 24 mEq/L (ref 19–32)
Chloride: 104 mEq/L (ref 96–112)
Creatinine, Ser: 0.72 mg/dL (ref 0.50–1.10)
Glucose, Bld: 91 mg/dL (ref 70–99)
Potassium: 4.4 mEq/L (ref 3.7–5.3)
SODIUM: 142 meq/L (ref 137–147)

## 2014-01-31 LAB — CBC
HCT: 38.1 % (ref 36.0–46.0)
Hemoglobin: 13.2 g/dL (ref 12.0–15.0)
MCH: 29.9 pg (ref 26.0–34.0)
MCHC: 34.6 g/dL (ref 30.0–36.0)
MCV: 86.2 fL (ref 78.0–100.0)
PLATELETS: 274 10*3/uL (ref 150–400)
RBC: 4.42 MIL/uL (ref 3.87–5.11)
RDW: 13.4 % (ref 11.5–15.5)
WBC: 7.4 10*3/uL (ref 4.0–10.5)

## 2014-01-31 SURGERY — INSERTION, TUNNELED CENTRAL VENOUS DEVICE, WITH PORT
Anesthesia: General | Site: Chest | Laterality: Left

## 2014-01-31 SURGERY — INSERTION, TUNNELED CENTRAL VENOUS DEVICE, WITH PORT
Anesthesia: General

## 2014-01-31 MED ORDER — LIDOCAINE HCL (CARDIAC) 20 MG/ML IV SOLN
INTRAVENOUS | Status: DC | PRN
  Administered 2014-01-31: 100 mg via INTRAVENOUS

## 2014-01-31 MED ORDER — BUPIVACAINE HCL (PF) 0.25 % IJ SOLN
INTRAMUSCULAR | Status: AC
  Filled 2014-01-31: qty 30

## 2014-01-31 MED ORDER — FENTANYL CITRATE 0.05 MG/ML IJ SOLN
INTRAMUSCULAR | Status: AC
  Filled 2014-01-31: qty 5

## 2014-01-31 MED ORDER — LACTATED RINGERS IV SOLN
INTRAVENOUS | Status: DC
  Administered 2014-01-31: 12:00:00 via INTRAVENOUS

## 2014-01-31 MED ORDER — PROPOFOL 10 MG/ML IV BOLUS
INTRAVENOUS | Status: AC
  Filled 2014-01-31: qty 20

## 2014-01-31 MED ORDER — SODIUM CHLORIDE 0.9 % IR SOLN
Status: DC | PRN
  Administered 2014-01-31: 13:00:00

## 2014-01-31 MED ORDER — ROCURONIUM BROMIDE 50 MG/5ML IV SOLN
INTRAVENOUS | Status: AC
  Filled 2014-01-31: qty 1

## 2014-01-31 MED ORDER — FENTANYL CITRATE 0.05 MG/ML IJ SOLN
INTRAMUSCULAR | Status: DC | PRN
  Administered 2014-01-31 (×2): 25 ug via INTRAVENOUS
  Administered 2014-01-31: 100 ug via INTRAVENOUS

## 2014-01-31 MED ORDER — ONDANSETRON HCL 4 MG/2ML IJ SOLN
INTRAMUSCULAR | Status: DC | PRN
  Administered 2014-01-31: 4 mg via INTRAVENOUS

## 2014-01-31 MED ORDER — MIDAZOLAM HCL 2 MG/2ML IJ SOLN
INTRAMUSCULAR | Status: AC
  Filled 2014-01-31: qty 2

## 2014-01-31 MED ORDER — HEPARIN (PORCINE) IN NACL 2-0.9 UNIT/ML-% IJ SOLN
INTRAMUSCULAR | Status: AC
  Filled 2014-01-31: qty 500

## 2014-01-31 MED ORDER — HEPARIN SOD (PORK) LOCK FLUSH 100 UNIT/ML IV SOLN
INTRAVENOUS | Status: DC | PRN
  Administered 2014-01-31: 500 [IU] via INTRAVENOUS

## 2014-01-31 MED ORDER — MIDAZOLAM HCL 5 MG/5ML IJ SOLN
INTRAMUSCULAR | Status: DC | PRN
  Administered 2014-01-31: 2 mg via INTRAVENOUS

## 2014-01-31 MED ORDER — ONDANSETRON HCL 4 MG/2ML IJ SOLN
INTRAMUSCULAR | Status: AC
  Filled 2014-01-31: qty 2

## 2014-01-31 MED ORDER — HEPARIN SOD (PORK) LOCK FLUSH 100 UNIT/ML IV SOLN
INTRAVENOUS | Status: AC
  Filled 2014-01-31: qty 5

## 2014-01-31 MED ORDER — CEFAZOLIN SODIUM-DEXTROSE 2-3 GM-% IV SOLR
2.0000 g | INTRAVENOUS | Status: AC
  Administered 2014-01-31: 2 g via INTRAVENOUS

## 2014-01-31 MED ORDER — BUPIVACAINE HCL (PF) 0.25 % IJ SOLN
INTRAMUSCULAR | Status: DC | PRN
  Administered 2014-01-31: 10 mL

## 2014-01-31 MED ORDER — OXYCODONE-ACETAMINOPHEN 10-325 MG PO TABS: 1.0000 | ORAL_TABLET | Freq: Four times a day (QID) | ORAL | Status: DC | PRN

## 2014-01-31 MED ORDER — PROPOFOL 10 MG/ML IV BOLUS
INTRAVENOUS | Status: DC | PRN
  Administered 2014-01-31: 200 mg via INTRAVENOUS

## 2014-01-31 MED ORDER — CEFAZOLIN SODIUM-DEXTROSE 2-3 GM-% IV SOLR
INTRAVENOUS | Status: AC
  Filled 2014-01-31: qty 50

## 2014-01-31 MED ORDER — ARTIFICIAL TEARS OP OINT
TOPICAL_OINTMENT | OPHTHALMIC | Status: AC
  Filled 2014-01-31: qty 3.5

## 2014-01-31 SURGICAL SUPPLY — 39 items
BAG DECANTER FOR FLEXI CONT (MISCELLANEOUS) ×3 IMPLANT
BLADE SURG 11 STRL SS (BLADE) ×3 IMPLANT
BLADE SURG 15 STRL LF DISP TIS (BLADE) ×1 IMPLANT
BLADE SURG 15 STRL SS (BLADE) ×2
CHLORAPREP W/TINT 26ML (MISCELLANEOUS) ×3 IMPLANT
COVER SURGICAL LIGHT HANDLE (MISCELLANEOUS) ×3 IMPLANT
CRADLE DONUT ADULT HEAD (MISCELLANEOUS) ×3 IMPLANT
DERMABOND ADVANCED (GAUZE/BANDAGES/DRESSINGS) ×2
DERMABOND ADVANCED .7 DNX12 (GAUZE/BANDAGES/DRESSINGS) ×1 IMPLANT
DRAPE C-ARM 42X72 X-RAY (DRAPES) ×3 IMPLANT
DRAPE LAPAROSCOPIC ABDOMINAL (DRAPES) ×3 IMPLANT
ELECT CAUTERY BLADE 6.4 (BLADE) ×3 IMPLANT
ELECT REM PT RETURN 9FT ADLT (ELECTROSURGICAL) ×3
ELECTRODE REM PT RTRN 9FT ADLT (ELECTROSURGICAL) ×1 IMPLANT
GAUZE SPONGE 4X4 16PLY XRAY LF (GAUZE/BANDAGES/DRESSINGS) ×3 IMPLANT
GLOVE BIO SURGEON STRL SZ7 (GLOVE) ×9 IMPLANT
GLOVE BIOGEL PI IND STRL 7.5 (GLOVE) ×1 IMPLANT
GLOVE BIOGEL PI INDICATOR 7.5 (GLOVE) ×2
GLOVE SS BIOGEL STRL SZ 7 (GLOVE) ×1 IMPLANT
GLOVE SUPERSENSE BIOGEL SZ 7 (GLOVE) ×2
GOWN STRL NON-REIN LRG LVL3 (GOWN DISPOSABLE) ×6 IMPLANT
KIT BASIN OR (CUSTOM PROCEDURE TRAY) ×3 IMPLANT
KIT PORT POWER 8FR ISP CVUE (Catheter) ×3 IMPLANT
KIT ROOM TURNOVER OR (KITS) ×3 IMPLANT
NEEDLE HYPO 25GX1X1/2 BEV (NEEDLE) ×3 IMPLANT
NS IRRIG 1000ML POUR BTL (IV SOLUTION) ×3 IMPLANT
PACK SURGICAL SETUP 50X90 (CUSTOM PROCEDURE TRAY) ×3 IMPLANT
PAD ARMBOARD 7.5X6 YLW CONV (MISCELLANEOUS) ×6 IMPLANT
PENCIL BUTTON HOLSTER BLD 10FT (ELECTRODE) ×3 IMPLANT
STAPLER VISISTAT 35W (STAPLE) ×3 IMPLANT
SUT MNCRL AB 4-0 PS2 18 (SUTURE) ×3 IMPLANT
SUT PROLENE 2 0 SH 30 (SUTURE) ×3 IMPLANT
SUT PROLENE 2 0 SH DA (SUTURE) ×3 IMPLANT
SUT VIC AB 3-0 SH 27 (SUTURE) ×2
SUT VIC AB 3-0 SH 27XBRD (SUTURE) ×1 IMPLANT
SYR 20ML ECCENTRIC (SYRINGE) ×6 IMPLANT
SYR 5ML LUER SLIP (SYRINGE) ×3 IMPLANT
TOWEL OR 17X24 6PK STRL BLUE (TOWEL DISPOSABLE) ×3 IMPLANT
TOWEL OR 17X26 10 PK STRL BLUE (TOWEL DISPOSABLE) ×3 IMPLANT

## 2014-01-31 NOTE — Interval H&P Note (Signed)
History and Physical Interval Note:  01/31/2014 11:30 AM  Madeline Davidson  has presented today for surgery, with the diagnosis of need for access  The various methods of treatment have been discussed with the patient and family. After consideration of risks, benefits and other options for treatment, the patient has consented to  Procedure(s): INSERTION PORT-A-CATH (N/A) as a surgical intervention .  The patient's history has been reviewed, patient examined, no change in status, stable for surgery.  I have reviewed the patient's chart and labs.  Questions were answered to the patient's satisfaction.     [name]

## 2014-01-31 NOTE — H&P (View-Only) (Signed)
Patient ID: Madeline Davidson, female   DOB: 04/22/1956, 58 y.o.   MRN: [id number]  Chief Complaint  Patient presents with  . New Evaluation    New Rt br cancer    HPI Madeline Davidson is a 58 y.o. female.  Referred by Dr Drue Second HPI 20 yof who lives in [place] and presents after noting a right breast mass around Christmas.  She underwent mm that showed a 3.3 cm lesion in the right breast at 7 oclock.  She also had enlarged right axillary node that was biopsied.  An ultrasound showed a 2.2x2.4 cm mass with a node measuring 1.6 cm   The pathology showed an invasive ductal carcinoma with metastatic carcinoma in the node.  This is er positive at 90%, pr at 1%, her2 is amplified. She comes in after seeing med onc for surgical evaluation.  Past Medical History  Diagnosis Date  . Breast cancer 12/16/13    self palpated  . Hypertension   . Allergy   . Hypertension 01/10/2014    Past Surgical History  Procedure Laterality Date  . Cholecystectomy    . Hernia repair    . Colon resection      Family History  Problem Relation Age of Onset  . Cancer Father 75    colon cancer  . Hypertension Mother     Social History History  Substance Use Topics  . Smoking status: Never Smoker   . Smokeless tobacco: Never Used  . Alcohol Use: No    Allergies  Allergen Reactions  . Ciprofloxacin Other (See Comments)    Patient had a headache after taking Cipro. Made her sinus infection symptoms worse.    Current Outpatient Prescriptions  Medication Sig Dispense Refill  . cetirizine (ZYRTEC) 10 MG tablet Take 10 mg by mouth daily.      . clonazePAM (KLONOPIN) 0.5 MG tablet Take 0.5 mg by mouth as needed for anxiety (take half a tablet as needed).      Marland Kitchen ibuprofen (ADVIL,MOTRIN) 800 MG tablet Take 800 mg by mouth as needed.      Marland Kitchen lisinopril-hydrochlorothiazide (PRINZIDE,ZESTORETIC) 10-12.5 MG per tablet Take 1 tablet by mouth daily.      Marland Kitchen zolpidem (AMBIEN) 10 MG tablet Take 10 mg by  mouth at bedtime as needed for sleep (take half a tablet at night).       No current facility-administered medications for this visit.    Review of Systems Review of Systems  Constitutional: Negative for fever, chills and unexpected weight change.  HENT: Negative for congestion, hearing loss, sore throat, trouble swallowing and voice change.   Eyes: Negative for visual disturbance.  Respiratory: Negative for cough and wheezing.   Cardiovascular: Negative for chest pain, palpitations and leg swelling.  Gastrointestinal: Negative for nausea, vomiting, abdominal pain, diarrhea, constipation, blood in stool, abdominal distention and anal bleeding.  Genitourinary: Negative for hematuria, vaginal bleeding and difficulty urinating.  Musculoskeletal: Negative for arthralgias.  Skin: Negative for rash and wound.  Neurological: Negative for seizures, syncope and headaches.  Hematological: Negative for adenopathy. Does not bruise/bleed easily.  Psychiatric/Behavioral: Negative for confusion.    Blood pressure 126/78, pulse 96, temperature 98.4 F (36.9 C), temperature source Temporal, resp. rate 14, height 5\' 4"  (1.626 m), weight 206 lb 12.8 oz (93.804 kg).  Physical Exam Physical Exam  Vitals reviewed. Constitutional: She appears well-developed and well-nourished.  Neck: Neck supple.  Cardiovascular: Normal rate, regular rhythm and normal heart sounds.   Pulmonary/Chest: Effort normal and breath  sounds normal. She has no wheezes. She has no rales. Right breast exhibits mass. Right breast exhibits no inverted nipple, no nipple discharge, no skin change and no tenderness. Left breast exhibits no inverted nipple, no mass, no nipple discharge, no skin change and no tenderness.    Abdominal: Soft. A hernia is present. Hernia confirmed positive in the ventral area.    Lymphadenopathy:    She has no cervical adenopathy.    She has axillary adenopathy.       Right axillary: Lateral adenopathy  present.       Right: No supraclavicular adenopathy present.       Left: No supraclavicular adenopathy present.    Data Reviewed Path, mm, [place] reviewed  Assessment    Clinical stage II right breast cancer     Plan    Staging studies and MRI pending as of now. We discussed the staging and pathophysiology of breast cancer. We discussed all of the different options for treatment for breast cancer including surgery, chemotherapy, radiation therapy, Herceptin, and antiestrogen therapy.  I think beginning with primary systemic therapy will improve chances of breast conservation therapy and may be able to enroll in node study in patients undergoing neoadjuvant therapy.  She certainly needs staging studies first to see if this is curable disease also. We discussed lumpectomy/mastectomy.  Will await mr and staging studies. I will plan on placing a port and we discussed performance of this and risks associated with it.       Madeline Davidson 01/20/2014, 3:26 PM

## 2014-01-31 NOTE — Anesthesia Procedure Notes (Signed)
Procedure Name: LMA Insertion Date/Time: 01/31/2014 12:38 PM Performed by: [place] Pink Pre-anesthesia Checklist: Patient identified, Timeout performed, Emergency Drugs available, Suction available and Patient being monitored Patient Re-evaluated:Patient Re-evaluated prior to inductionOxygen Delivery Method: Circle system utilized Preoxygenation: Pre-oxygenation with 100% oxygen Intubation Type: IV induction Ventilation: Mask ventilation without difficulty and Oral airway inserted - appropriate to patient size LMA: LMA inserted LMA Size: 4.0 Number of attempts: 2 Placement Confirmation: positive ETCO2 and breath sounds checked- equal and bilateral Tube secured with: Tape Dental Injury: Teeth and Oropharynx as per pre-operative assessment

## 2014-01-31 NOTE — Anesthesia Postprocedure Evaluation (Signed)
Anesthesia Post Note  Patient: Madeline Davidson  Procedure(s) Performed: Procedure(s) (LRB): INSERTION PORT-A-CATH (Left)  Anesthesia type: general  Patient location: PACU  Post pain: Pain level controlled  Post assessment: Patient's Cardiovascular Status Stable  Last Vitals:  Filed Vitals:   01/31/14 1449  BP:   Pulse:   Temp: 36.2 C  Resp:     Post vital signs: Reviewed and stable  Level of consciousness: sedated  Complications: No apparent anesthesia complications

## 2014-01-31 NOTE — Transfer of Care (Signed)
Immediate Anesthesia Transfer of Care Note  Patient: Madeline Davidson  Procedure(s) Performed: Procedure(s): INSERTION PORT-A-CATH (Left)  Patient Location: PACU  Anesthesia Type:General  Level of Consciousness: awake, alert  and oriented  Airway & Oxygen Therapy: Patient Spontanous Breathing and Patient connected to nasal cannula oxygen  Post-op Assessment: Report given to PACU RN and Post -op Vital signs reviewed and stable  Post vital signs: Reviewed and stable  Complications: No apparent anesthesia complications

## 2014-01-31 NOTE — Anesthesia Preprocedure Evaluation (Addendum)
Anesthesia Evaluation  Patient identified by MRN, date of birth, ID band Patient awake    Reviewed: Allergy & Precautions, H&P , NPO status , Patient's Chart, lab work & pertinent test results, reviewed documented beta blocker date and time   History of Anesthesia Complications Negative for: history of anesthetic complications  Airway Mallampati: II TM Distance: >3 FB Neck ROM: Full    Dental  (+) Teeth Intact and Dental Advisory Given   Pulmonary neg pulmonary ROS,    Pulmonary exam normal       Cardiovascular hypertension, Pt. on medications     Neuro/Psych negative neurological ROS  negative psych ROS   GI/Hepatic negative GI ROS, Neg liver ROS,   Endo/Other  negative endocrine ROSBreast CA  Renal/GU negative Renal ROS     Musculoskeletal   Abdominal Normal abdominal exam  (+)   Peds  Hematology   Anesthesia Other Findings   Reproductive/Obstetrics                         Anesthesia Physical Anesthesia Plan  ASA: II  Anesthesia Plan: General   Post-op Pain Management:    Induction: Intravenous  Airway Management Planned: LMA  Additional Equipment:   Intra-op Plan:   Post-operative Plan: Extubation in OR  Informed Consent: I have reviewed the patients History and Physical, chart, labs and discussed the procedure including the risks, benefits and alternatives for the proposed anesthesia with the patient or authorized representative who has indicated his/her understanding and acceptance.   Dental advisory given  Plan Discussed with: Surgeon and CRNA  Anesthesia Plan Comments:        Anesthesia Quick Evaluation

## 2014-01-31 NOTE — Preoperative (Signed)
Beta Blockers   Reason not to administer Beta Blockers:Not Applicable 

## 2014-01-31 NOTE — Op Note (Signed)
Preoperative diagnosis: Clinical stage II right breast cancer Postoperative diagnosis: Same as above Procedure: Left subclavian power port insertion Surgeon: Dr. Serita Grammes Anesthesia: Gen. With LMA Estimated blood loss: Minimal Complications: None Drains: None Specimens: None Sponge count correct at completion Disposition to recovery stable  Indications: This is a 58 year old female with a newly diagnosed clinical stage II right breast cancer. We have decided to proceed with primary systemic therapy to try to reduce the size of this mass in order to facilitate breast conservation therapy. She is undergoing an evaluation at this time with medical oncology. We discussed a port placement to begin chemotherapy.  Procedure: After informed consent was obtained the patient was taken to the operating room. She was given cefazolin. Sequential compression devices were on her legs. She was placed under general anesthesia with an LMA. She was then prepped and draped in the standard sterile surgical fashion. She had been previously tucked and appropriately padded. A surgical timeout was performed.  I infiltrated Marcaine throughout her left chest as well as onto her clavicle. I then accessed her subclavian vein. This was done without difficulty. The wire was placed and this was in good position by fluoroscopy. I then made a pocket below this overlying the pectoralis fascia. I then tunneled the line between the 2 sites. I then dilated the tract. I then inserted the peel-away sheath and followed this with fluoroscopy into the correct position. I then removed the wire assembly. I then placed the line through this. I then removed the peel-away sheath. The line was then placed in the vena cava. This was then attached to the port. This was sutured in position with 2-0 Prolene suture. This aspirated easily and flushed. This was then packed with heparin. I then closed this with 3-0 Vicryl, 4 Monocryl, and Dermabond.  Final fluoroscopy showed the entire port as well as the line to be in position without any kinks. She was then extubated and transferred to recovery stable.

## 2014-01-31 NOTE — Discharge Instructions (Signed)
    PORT-A-CATH: POST OP INSTRUCTIONS  Always review your discharge instruction sheet given to you by the facility where your surgery was performed.   1. A prescription for pain medication may be given to you upon discharge. Take your pain medication as prescribed, if needed. If narcotic pain medicine is not needed, then you make take acetaminophen (Tylenol) or ibuprofen (Advil) as needed.  2. Take your usually prescribed medications unless otherwise directed. 3. If you need a refill on your pain medication, please contact our office. All narcotic pain medicine now requires a paper prescription.  Phoned in and fax refills are no longer allowed by law.  Prescriptions will not be filled after 5 pm or on weekends.  4. You should follow a light diet for the remainder of the day after your procedure. 5. Most patients will experience some mild swelling and/or bruising in the area of the incision. It may take several days to resolve. 6. It is common to experience some constipation if taking pain medication after surgery. Increasing fluid intake and taking a stool softener (such as Colace) will usually help or prevent this problem from occurring. A mild laxative (Milk of Magnesia or Miralax) should be taken according to package directions if there are no bowel movements after 48 hours.  7. Unless discharge instructions indicate otherwise, you may remove your bandages 48 hours after surgery, and you may shower at that time. You may have steri-strips (small white skin tapes) in place directly over the incision.  These strips should be left on the skin for 7-10 days.  If your surgeon used Dermabond (skin glue) on the incision, you may shower in 24 hours.  The glue will flake off over the next 2-3 weeks.  8. If your port is left accessed at the end of surgery (needle left in port), the dressing cannot get wet and should only by changed by a healthcare professional. When the port is no longer accessed (when the  needle has been removed), follow step 7.   9. ACTIVITIES:  Limit activity involving your arms for the next 72 hours. Do no strenuous exercise or activity for 1 week. You may drive when you are no longer taking prescription pain medication, you can comfortably wear a seatbelt, and you can maneuver your car. 10.You may need to see your doctor in the office for a follow-up appointment.  Please       check with your doctor.  11.When you receive a new Port-a-Cath, you will get a product guide and        ID card.  Please keep them in case you need them.  WHEN TO CALL YOUR DOCTOR ([phone]): 1. Fever over 101.0 2. Chills 3. Continued bleeding from incision 4. Increased redness and tenderness at the site 5. Shortness of breath, difficulty breathing   The clinic staff is available to answer your questions during regular business hours. Please don't hesitate to call and ask to speak to one of the nurses or medical assistants for clinical concerns. If you have a medical emergency, go to the nearest emergency room or call 911.  A surgeon from [facility] is always on call at the hospital.     For further information, please visit [email or web address]      

## 2014-02-01 ENCOUNTER — Other Ambulatory Visit: Payer: Self-pay | Admitting: *Deleted

## 2014-02-01 ENCOUNTER — Ambulatory Visit
Admission: RE | Admit: 2014-02-01 | Discharge: 2014-02-01 | Disposition: A | Discharge status: Home / Self Care | Payer: BC Managed Care – PPO | Source: Ambulatory Visit | Attending: Oncology | Admitting: Oncology

## 2014-02-01 MED ORDER — GADOBENATE DIMEGLUMINE 529 MG/ML IV SOLN
19.0000 mL | Freq: Once | INTRAVENOUS | Status: AC | PRN
  Administered 2014-02-01: 19 mL via INTRAVENOUS

## 2014-02-02 ENCOUNTER — Encounter: Payer: Self-pay | Admitting: *Deleted

## 2014-02-02 ENCOUNTER — Other Ambulatory Visit: Payer: BC Managed Care – PPO

## 2014-02-03 ENCOUNTER — Encounter: Payer: Self-pay | Admitting: Oncology

## 2014-02-03 ENCOUNTER — Ambulatory Visit (HOSPITAL_BASED_OUTPATIENT_CLINIC_OR_DEPARTMENT_OTHER): Payer: BC Managed Care – PPO | Admitting: Oncology

## 2014-02-03 ENCOUNTER — Other Ambulatory Visit (HOSPITAL_BASED_OUTPATIENT_CLINIC_OR_DEPARTMENT_OTHER): Payer: BC Managed Care – PPO

## 2014-02-03 ENCOUNTER — Ambulatory Visit (HOSPITAL_BASED_OUTPATIENT_CLINIC_OR_DEPARTMENT_OTHER): Payer: BC Managed Care – PPO

## 2014-02-03 VITALS — BP 112/67 | HR 69 | Temp 98.2°F | Resp 18

## 2014-02-03 VITALS — BP 126/74 | HR 81 | Temp 98.0°F | Resp 20 | Ht 64.0 in | Wt 208.2 lb

## 2014-02-03 DIAGNOSIS — C773 Secondary and unspecified malignant neoplasm of axilla and upper limb lymph nodes: Secondary | ICD-10-CM

## 2014-02-03 DIAGNOSIS — C50919 Malignant neoplasm of unspecified site of unspecified female breast: Secondary | ICD-10-CM

## 2014-02-03 DIAGNOSIS — C50519 Malignant neoplasm of lower-outer quadrant of unspecified female breast: Secondary | ICD-10-CM

## 2014-02-03 DIAGNOSIS — Z5112 Encounter for antineoplastic immunotherapy: Secondary | ICD-10-CM

## 2014-02-03 DIAGNOSIS — Z5111 Encounter for antineoplastic chemotherapy: Secondary | ICD-10-CM

## 2014-02-03 DIAGNOSIS — I10 Essential (primary) hypertension: Secondary | ICD-10-CM

## 2014-02-03 DIAGNOSIS — Z170 Estrogen receptor positive status [ER+]: Secondary | ICD-10-CM

## 2014-02-03 DIAGNOSIS — R599 Enlarged lymph nodes, unspecified: Secondary | ICD-10-CM

## 2014-02-03 LAB — COMPREHENSIVE METABOLIC PANEL (CC13)
ALBUMIN: 3.7 g/dL (ref 3.5–5.0)
ALT: 16 U/L (ref 0–55)
ANION GAP: 10 meq/L (ref 3–11)
AST: 12 U/L (ref 5–34)
Alkaline Phosphatase: 80 U/L (ref 40–150)
BUN: 19.9 mg/dL (ref 7.0–26.0)
CO2: 25 mEq/L (ref 22–29)
CREATININE: 0.8 mg/dL (ref 0.6–1.1)
Calcium: 10 mg/dL (ref 8.4–10.4)
Chloride: 106 mEq/L (ref 98–109)
Glucose: 169 mg/dl — ABNORMAL HIGH (ref 70–140)
POTASSIUM: 3.6 meq/L (ref 3.5–5.1)
Sodium: 140 mEq/L (ref 136–145)
TOTAL PROTEIN: 7.2 g/dL (ref 6.4–8.3)
Total Bilirubin: 0.21 mg/dL (ref 0.20–1.20)

## 2014-02-03 LAB — CBC WITH DIFFERENTIAL/PLATELET
BASO%: 0.1 % (ref 0.0–2.0)
Basophils Absolute: 0 10*3/uL (ref 0.0–0.1)
EOS%: 0 % (ref 0.0–7.0)
Eosinophils Absolute: 0 10*3/uL (ref 0.0–0.5)
HCT: 39.1 % (ref 34.8–46.6)
HEMOGLOBIN: 13.1 g/dL (ref 11.6–15.9)
LYMPH#: 0.8 10*3/uL — AB (ref 0.9–3.3)
LYMPH%: 5.4 % — ABNORMAL LOW (ref 14.0–49.7)
MCH: 28.8 pg (ref 25.1–34.0)
MCHC: 33.5 g/dL (ref 31.5–36.0)
MCV: 85.9 fL (ref 79.5–101.0)
MONO#: 0.5 10*3/uL (ref 0.1–0.9)
MONO%: 3.2 % (ref 0.0–14.0)
NEUT#: 13.9 10*3/uL — ABNORMAL HIGH (ref 1.5–6.5)
NEUT%: 91.3 % — AB (ref 38.4–76.8)
Platelets: 300 10*3/uL (ref 145–400)
RBC: 4.55 10*6/uL (ref 3.70–5.45)
RDW: 13.5 % (ref 11.2–14.5)
WBC: 15.3 10*3/uL — ABNORMAL HIGH (ref 3.9–10.3)

## 2014-02-03 MED ORDER — PERTUZUMAB CHEMO INJECTION 420 MG/14ML
840.0000 mg | Freq: Once | INTRAVENOUS | Status: AC
  Administered 2014-02-03: 840 mg via INTRAVENOUS
  Filled 2014-02-03: qty 28

## 2014-02-03 MED ORDER — DIPHENHYDRAMINE HCL 25 MG PO CAPS
50.0000 mg | ORAL_CAPSULE | Freq: Once | ORAL | Status: AC
  Administered 2014-02-03: 50 mg via ORAL

## 2014-02-03 MED ORDER — SODIUM CHLORIDE 0.9 % IV SOLN
75.0000 mg/m2 | Freq: Once | INTRAVENOUS | Status: AC
  Administered 2014-02-03: 150 mg via INTRAVENOUS
  Filled 2014-02-03: qty 15

## 2014-02-03 MED ORDER — ONDANSETRON 16 MG/50ML IVPB (CHCC)
INTRAVENOUS | Status: AC
  Filled 2014-02-03: qty 16

## 2014-02-03 MED ORDER — SODIUM CHLORIDE 0.9 % IV SOLN
Freq: Once | INTRAVENOUS | Status: AC
  Administered 2014-02-03: 11:00:00 via INTRAVENOUS

## 2014-02-03 MED ORDER — ONDANSETRON 16 MG/50ML IVPB (CHCC)
16.0000 mg | Freq: Once | INTRAVENOUS | Status: AC
  Administered 2014-02-03: 16 mg via INTRAVENOUS

## 2014-02-03 MED ORDER — DEXAMETHASONE SODIUM PHOSPHATE 20 MG/5ML IJ SOLN
20.0000 mg | Freq: Once | INTRAMUSCULAR | Status: AC
  Administered 2014-02-03: 20 mg via INTRAVENOUS

## 2014-02-03 MED ORDER — DIPHENHYDRAMINE HCL 25 MG PO CAPS
ORAL_CAPSULE | ORAL | Status: AC
  Filled 2014-02-03: qty 2

## 2014-02-03 MED ORDER — TRASTUZUMAB CHEMO INJECTION 440 MG
8.0000 mg/kg | Freq: Once | INTRAVENOUS | Status: AC
  Administered 2014-02-03: 756 mg via INTRAVENOUS
  Filled 2014-02-03: qty 36

## 2014-02-03 MED ORDER — HEPARIN SOD (PORK) LOCK FLUSH 100 UNIT/ML IV SOLN
500.0000 [IU] | Freq: Once | INTRAVENOUS | Status: AC | PRN
  Administered 2014-02-03: 500 [IU]
  Filled 2014-02-03: qty 5

## 2014-02-03 MED ORDER — SODIUM CHLORIDE 0.9 % IJ SOLN
10.0000 mL | INTRAMUSCULAR | Status: DC | PRN
  Administered 2014-02-03: 10 mL
  Filled 2014-02-03: qty 10

## 2014-02-03 MED ORDER — ACETAMINOPHEN 325 MG PO TABS
ORAL_TABLET | ORAL | Status: AC
  Filled 2014-02-03: qty 2

## 2014-02-03 MED ORDER — DEXAMETHASONE SODIUM PHOSPHATE 20 MG/5ML IJ SOLN
INTRAMUSCULAR | Status: AC
  Filled 2014-02-03: qty 5

## 2014-02-03 MED ORDER — CARBOPLATIN CHEMO INJECTION 600 MG/60ML
840.0000 mg | Freq: Once | INTRAVENOUS | Status: AC
  Administered 2014-02-03: 840 mg via INTRAVENOUS
  Filled 2014-02-03: qty 84

## 2014-02-03 MED ORDER — ACETAMINOPHEN 325 MG PO TABS
650.0000 mg | ORAL_TABLET | Freq: Once | ORAL | Status: AC
  Administered 2014-02-03: 650 mg via ORAL

## 2014-02-03 NOTE — Progress Notes (Signed)
Faxed fmla form to [facility] @ [phone]

## 2014-02-03 NOTE — Patient Instructions (Signed)
[facility] Discharge Instructions for Patients Receiving Chemotherapy  Today you received the following chemotherapy agents; Herceptin, Perjeta, Taxotere and Carboplatin.   To help prevent nausea and vomiting after your treatment, we encourage you to take your nausea medication as directed.    If you develop nausea and vomiting that is not controlled by your nausea medication, call the clinic.   BELOW ARE SYMPTOMS THAT SHOULD BE REPORTED IMMEDIATELY:  *FEVER GREATER THAN 100.5 F  *CHILLS WITH OR WITHOUT FEVER  NAUSEA AND VOMITING THAT IS NOT CONTROLLED WITH YOUR NAUSEA MEDICATION  *UNUSUAL SHORTNESS OF BREATH  *UNUSUAL BRUISING OR BLEEDING  TENDERNESS IN MOUTH AND THROAT WITH OR WITHOUT PRESENCE OF ULCERS  *URINARY PROBLEMS  *BOWEL PROBLEMS  UNUSUAL RASH Items with * indicate a potential emergency and should be followed up as soon as possible.  Feel free to call the clinic you have any questions or concerns. The clinic phone number is [phone].    

## 2014-02-03 NOTE — Progress Notes (Signed)
OFFICE PROGRESS NOTE  CC  [name], MD [address] [place] [address] Dr. Rolm Bookbinder Dr. Thea Silversmith  DIAGNOSIS: 58 year old female with new diagnosis of right breast cancer in the lower outer quadrant diagnosed December 2014.  STAGE:  Breast cancer  Primary site: Breast (Right)  Staging method: AJCC 7th Edition  Clinical: Stage IIB (T2, N1, cM0) signed by [name], MD on 01/10/2014 5:01 PM  Summary: Stage IIB (T2, N1, cM0)   PRIOR THERAPY: #1 Patient on 12/16/2013 felt a lump in her right breast. She states it was nickel size. Because of this she 1 on to see her nurse practitioner. On 12/31/2013 a mammogram was ordered. This showed a lesion in the 7:00 position measuring 3.3 cm. She was also noted to have enlarged lymph nodes. Subsequent ultrasound showed a 2.2 x 2.4 cm mass with enlarged lymph node measuring 1.6 cm. This was biopsied in [place]. 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 Lymph node was positive for metastatic disease. Prognostic markers revealed the tumor to be estrogen receptor +90% progesterone receptor 1% HER-2/neu positive  #2 staging PET/CT performed on 60/45/4098: Hypermetabolic mass posterior right breast with SUV max 13.6 and  measuring 13.2 cm (image 108). There two 12 in 16 mm hypermetabolic  nodules in the posterior right breast versus lymph nodes with  intense metabolic activity SUV max 10.9. No hypermetabolic  supraclavicular internal mammary nodes. No hypermetabolic  mediastinal lymph nodes. No suspicious pulmonary nodules.  ABDOMEN/PELVIS  There are 3 hypermetabolic periportal lymph nodes. The largest  measures 20 mm with SUV max 13.6. Additional small less than 10 mm  short axis nodes on image 144 and 130 also have intense metabolic  activity. No abnormal metabolic activity within the liver. No  hypermetabolic abdominal pelvic lymph nodes.  #3 patient has been seen by Dr. Rolm Bookbinder and Dr. Thea Silversmith in consultation.  CURRENT THERAPY: Cycle 1 day 1 of TCH/perjeta  INTERVAL HISTORY: Madeline Davidson 58 y.o. female returns for followup visit prior to initiation of cycle 1 of chemotherapy. Overall she's doing well. Today she is accompanied by her daughter. She denies any headaches double vision blurring of vision no fevers chills or night sweats no abdominal pain. She does have Port-A-Cath it does not hurt her is in good position. No evidence of infections. She has no peripheral paresthesias no diarrhea or constipation. Remainder of the 10 point review of systems is negative.  MEDICAL HISTORY: Past Medical History  Diagnosis Date  . 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 Hypertension   . Allergy   . Hypertension 01/10/2014  . Wears glasses     ALLERGIES:  is allergic to ciprofloxacin.  MEDICATIONS:  Current Outpatient Prescriptions  Medication Sig Dispense Refill  . cetirizine (ZYRTEC) 10 MG tablet Take 10 mg by mouth daily.      . clonazePAM (KLONOPIN) 0.5 MG tablet Take 0.5 mg by mouth as needed for anxiety (take half a tablet as needed).      Marland Kitchen dexamethasone (DECADRON) 4 MG tablet Take 2 tablets (8 mg total) by mouth 2 (two) times daily with a meal. Take two times a day the day before Taxotere. Then take two times a day starting the day after chemo for 3 days.  30 tablet  1  . ibuprofen (ADVIL,MOTRIN) 800 MG tablet Take 800 mg by mouth as needed.      Marland Kitchen  lidocaine-prilocaine (EMLA) cream Apply topically as needed.  30 g  6  . lisinopril-hydrochlorothiazide (PRINZIDE,ZESTORETIC) 10-12.5 MG per tablet Take 1 tablet by mouth daily.      Marland Kitchen LORazepam (ATIVAN) 0.5 MG tablet Take 1 tablet (0.5 mg total) by mouth every 6 (six) hours as needed (Nausea or vomiting).  30 tablet  0  . ondansetron (ZOFRAN) 8 MG tablet Take 1 tablet (8 mg total) by mouth 2 (two) times daily. Take two times a day starting the day  after chemo for 3 days. Then take two times a day as needed for nausea or vomiting.  30 tablet  1  . oxyCODONE-acetaminophen (PERCOCET) 10-325 MG per tablet Take 1 tablet by mouth every 6 (six) hours as needed for pain.  20 tablet  0  . prochlorperazine (COMPAZINE) 10 MG tablet Take 1 tablet (10 mg total) by mouth every 6 (six) hours as needed (Nausea or vomiting).  30 tablet  1  . zolpidem (AMBIEN) 10 MG tablet Take 10 mg by mouth at bedtime as needed for sleep (take half a tablet at night).       No current facility-administered medications for this visit.    SURGICAL HISTORY:  Past Surgical History  Procedure Laterality Date  . Colon resection  2004  . Hernia repair  2005    umb  . Cholecystectomy  2005    REVIEW OF SYSTEMS:  Pertinent items are noted in HPI.     PHYSICAL EXAMINATION: Blood pressure 126/74, pulse 81, temperature 98 F (36.7 C), temperature source Oral, resp. rate 20, height 5' 4" (1.626 m), weight 208 lb 3.2 oz (94.439 kg). Body mass index is 35.72 kg/(m^2). ECOG PERFORMANCE STATUS: 0 - Asymptomatic   General appearance: alert, cooperative and appears stated age Neck: no adenopathy, no carotid bruit, no JVD, supple, symmetrical, trachea midline and thyroid not enlarged, symmetric, no tenderness/mass/nodules Lymph nodes: Cervical, supraclavicular, and axillary nodes normal. Resp: clear to auscultation bilaterally Back: symmetric, no curvature. ROM normal. No CVA tenderness. Cardio: regular rate and rhythm GI: soft, non-tender; bowel sounds normal; no masses,  no organomegaly Extremities: extremities normal, atraumatic, no cyanosis or edema Neurologic: Grossly normal Breasts: left breast normal without mass, skin or nipple changes or axillary nodes, abnormal mass palpable right breast.   LABORATORY DATA: Lab Results  Component Value Date   WBC 15.3* 02/03/2014   HGB 13.1 02/03/2014   HCT 39.1 02/03/2014   MCV 85.9 02/03/2014   PLT 300 02/03/2014       Chemistry      Component Value Date/Time   NA 142 01/31/2014 1143   NA 141 01/25/2014 1133   K 4.4 01/31/2014 1143   K 4.5 01/25/2014 1133   CL 104 01/31/2014 1143   CO2 24 01/31/2014 1143   CO2 29 01/25/2014 1133   BUN 18 01/31/2014 1143   BUN 17.0 01/25/2014 1133   CREATININE 0.72 01/31/2014 1143   CREATININE 0.8 01/25/2014 1133      Component Value Date/Time   CALCIUM 9.2 01/31/2014 1143   CALCIUM 10.2 01/25/2014 1133   ALKPHOS 92 01/25/2014 1133   AST 20 01/25/2014 1133   ALT 20 01/25/2014 1133   BILITOT 0.33 01/25/2014 1133     Diagnosis 1. Consult Slide , Right Breast Biopsy - INVASIVE DUCTAL CARCINOMA. - SEE COMMENT. 2. Consult Slide , Right lymph node - METASTATIC CARCINOMA IN 1 OF 1 LYMPH NODE (1/1). Microscopic Comment 1. The carcinoma is at least grade II. Per outside report, the  tumor is positive for estrogen receptor (90% of tumor cells show strong nuclear staining) and positive for progesterone receptor (1% of tumor cells show weak nuclear staining). The tumor cells are positive for Her 2 amplification by immunohistochemistry. (JBK:kh 01-13-14) Enid Cutter MD Pathologist, Electronic Signature (Case signed 01/13/2014) Specimen Gross and Clinical Information Specimen(s) Obtained: 1. Consult Slide , Right Breast Biopsy 2. Consult Slide , Right lymph node Specimen Clinical Information 1. (tl) 2. (tl) Gross 1. Received from [place]:  Ct Chest W Contrast  01/20/2014   CLINICAL DATA:  Initial staging of breast cancer. Node positive right breast cancer.  EXAM: CT CHEST, ABDOMEN, AND PELVIS WITH CONTRAST  TECHNIQUE: Multidetector CT imaging of the chest, abdomen and pelvis was performed following the standard protocol during bolus administration of intravenous contrast.  CONTRAST:  122m OMNIPAQUE IOHEXOL 300 MG/ML  SOLN  COMPARISON:  NM PET IMAGE INITIAL (PI) SKULL BASE TO THIGH dated 01/20/2014  FINDINGS: CT CHEST FINDINGS  There is a mass within the inferior  lateral right breast measuring 2.5 cm. More superiorly there 2 adjacent masses versus lymph nodes in the posterior aspect of the right breast measuring 19 and 18 mm. Small sub pectoral lymph node measures 5 mm.  No infraclavicular or internal mammary lymphadenopathy. No mediastinal or hilar lymphadenopathy. No central pulmonary embolism. No pericardial fluid.  View of the lung parenchyma demonstrates no pulmonary nodules.  CT ABDOMEN AND PELVIS FINDINGS  No focal hepatic lesion. Pneumobilia within the left hepatic lobe suggesting prior sphincterotomy. There is enlarged periportal lymph node measuring 21 mm (image 66, series 2). Smaller lymph node measuring 8 mm (image 62) is not pathologic by size criteria. The pancreas, spleen, adrenal glands, and kidneys are normal.  The stomach, small bowel, and colon demonstrate no evidence of obstruction or mass. There are at least 3 ventral hernias. The medial hernia sacs contains the the terminal ileum and cecum. The lateral hernia sac contains a long segment of small bowel. The more superior hernia sac contains a short segment of transverse colon  Abdominal aorta is normal caliber. No retroperitoneal periportal lymphadenopathy. . No free fluid the pelvis. The uterus, ovaries, and bladder normal. No pelvic lymphadenopathy. No aggressive osseous lesion.  IMPRESSION: 1. Mass in the posterior lateral right breast consists with primary carcinoma. 2. Two  additional right breast lesions versus nodal metastasis. 3. No evidence of central nodal metastasis or mediastinal nodal metastasis. 4. Single enlarged periportal lymph node. Of note this lymph node was hypermetabolic on comparison PET-CT scan. 5. Multiple ventral hernias contain nonobstructed segments of small and large bowel.   Electronically Signed   By: SSuzy BouchardM.D.   On: 01/20/2014 15:37   Ct Abdomen Pelvis W Contrast  01/20/2014   CLINICAL DATA:  Initial staging of breast cancer. Node positive right breast  cancer.  EXAM: CT CHEST, ABDOMEN, AND PELVIS WITH CONTRAST  TECHNIQUE: Multidetector CT imaging of the chest, abdomen and pelvis was performed following the standard protocol during bolus administration of intravenous contrast.  CONTRAST:  1021mOMNIPAQUE IOHEXOL 300 MG/ML  SOLN  COMPARISON:  NM PET IMAGE INITIAL (PI) SKULL BASE TO THIGH dated 01/20/2014  FINDINGS: CT CHEST FINDINGS  There is a mass within the inferior lateral right breast measuring 2.5 cm. More superiorly there 2 adjacent masses versus lymph nodes in the posterior aspect of the right breast measuring 19 and 18 mm. Small sub pectoral lymph node measures 5 mm.  No infraclavicular or internal mammary lymphadenopathy.  No mediastinal or hilar lymphadenopathy. No central pulmonary embolism. No pericardial fluid.  View of the lung parenchyma demonstrates no pulmonary nodules.  CT ABDOMEN AND PELVIS FINDINGS  No focal hepatic lesion. Pneumobilia within the left hepatic lobe suggesting prior sphincterotomy. There is enlarged periportal lymph node measuring 21 mm (image 66, series 2). Smaller lymph node measuring 8 mm (image 62) is not pathologic by size criteria. The pancreas, spleen, adrenal glands, and kidneys are normal.  The stomach, small bowel, and colon demonstrate no evidence of obstruction or mass. There are at least 3 ventral hernias. The medial hernia sacs contains the the terminal ileum and cecum. The lateral hernia sac contains a long segment of small bowel. The more superior hernia sac contains a short segment of transverse colon  Abdominal aorta is normal caliber. No retroperitoneal periportal lymphadenopathy. . No free fluid the pelvis. The uterus, ovaries, and bladder normal. No pelvic lymphadenopathy. No aggressive osseous lesion.  IMPRESSION: 1. Mass in the posterior lateral right breast consists with primary carcinoma. 2. Two  additional right breast lesions versus nodal metastasis. 3. No evidence of central nodal metastasis or  mediastinal nodal metastasis. 4. Single enlarged periportal lymph node. Of note this lymph node was hypermetabolic on comparison PET-CT scan. 5. Multiple ventral hernias contain nonobstructed segments of small and large bowel.   Electronically Signed   By: Suzy Bouchard M.D.   On: 01/20/2014 15:37   Nm Pet Image Initial (pi) Skull Base To Thigh  01/20/2014   CLINICAL DATA:  Initial treatment strategy for breast carcinoma.  EXAM: NUCLEAR MEDICINE PET SKULL BASE TO THIGH  FASTING BLOOD GLUCOSE:  Value: 64m/dl  TECHNIQUE: 17.6 mCi F-18 FDG was injected intravenously. CT data was obtained and used for attenuation correction and anatomic localization only. (This was not acquired as a diagnostic CT examination.) Additional exam technical data entered on technologist worksheet.  COMPARISON:  None.  FINDINGS: NECK  No hypermetabolic lymph nodes in the neck.  CHEST  Hypermetabolic mass posterior right breast with SUV max 13.6 and measuring 13.2 cm (image 108). There two 12 in 16 mm hypermetabolic nodules in the posterior right breast versus lymph nodes with intense metabolic activity SUV max 10.9. No hypermetabolic supraclavicular internal mammary nodes. No hypermetabolic mediastinal lymph nodes. No suspicious pulmonary nodules.  ABDOMEN/PELVIS  There are 3 hypermetabolic periportal lymph nodes. The largest measures 20 mm with SUV max 13.6. Additional small less than 10 mm short axis nodes on image 144 and 130 also have intense metabolic activity. No abnormal metabolic activity within the liver. No hypermetabolic abdominal pelvic lymph nodes.  SKELETON  No focal hypermetabolic activity to suggest skeletal metastasis.  IMPRESSION: 1. Hypermetabolic mass in the posterior right breast consists with primary breast carcinoma. 2. Two hypermetabolic nodules in the deep right breast either represents additional foci of breast cancers or metastatic lymph nodes. 3. No evidence of central hypermetabolic nodes or mediastinal  nodes. 4. Hypermetabolic metastatic lymph nodes within the porta hepatis.   Electronically Signed   By: SSuzy BouchardM.D.   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 Patient had staging studies performed including a PET scan that did show hypermetabolic mass in the posterior right  breast consistent with breast primary. There were also noted to be 2 hypermetabolic nodules in the deep right breast either representing additional foci of breast cancer or metastatic lymph nodes. There was no evidence of central hypermetabolic nodes or mediastinal nodes. There were noted to be a hypermetabolic metastatic lymph nodes within the porta hepatis. MRI of the breasts performed on 02/01/2014 revealed nuclear mass in the lateral right breast history 1 measurement 3.8 cm on the biopsy-proven invasive carcinoma. 2 intramammary lymph nodes in the upper outer right breast with abnormal morphology. One of which was biopsy-proven to represent metastatic carcinoma. There is indeterminate 0.7 cm level I right axillary lymph node procedure to [place] muscle and anterior to Trellis minor muscle. Indeterminate 0.8 cm lower mediastinal right paracardiac lymph node. These nodes were not hypermetabolic on recent PET scan. There was no evidence of malignancy in the left breast.  #2 patient is here for cycle 1 day 1 of TCH/Perjeta today. She will receive day 2 Neulasta tomorrow. I will plan on seeing her back in one week for followup and interim lab. Risks benefits and side effects of treatment were discussed with her today in detail. We also went over the MRI results.  #3 hypertension: This is under  reasonable control.  #3 anti-emetic usage: We discussed how to use patient's anxiety medics. She does have Zofran Compazine dexamethasone as well as Ativan at her disposal.  #4 patient will be seen back in one week for followup and labs.  All questions were answered. The patient knows to call the clinic with any problems, questions or concerns. We can certainly see the patient much sooner if necessary.  I spent 25 minutes counseling the patient face to face. The total time spent in the appointment was 30 minutes.    Marcy Panning, MD Medical/Oncology [facility] [phone] (beeper) [phone] (Office)  02/03/2014, 9:54 AM

## 2014-02-04 ENCOUNTER — Telehealth: Payer: Self-pay | Admitting: *Deleted

## 2014-02-04 ENCOUNTER — Encounter: Payer: Self-pay | Admitting: *Deleted

## 2014-02-04 ENCOUNTER — Ambulatory Visit (HOSPITAL_BASED_OUTPATIENT_CLINIC_OR_DEPARTMENT_OTHER): Payer: BC Managed Care – PPO

## 2014-02-04 VITALS — BP 143/57 | HR 82 | Temp 98.2°F

## 2014-02-04 DIAGNOSIS — C50919 Malignant neoplasm of unspecified site of unspecified female breast: Secondary | ICD-10-CM

## 2014-02-04 DIAGNOSIS — C50519 Malignant neoplasm of lower-outer quadrant of unspecified female breast: Secondary | ICD-10-CM

## 2014-02-04 DIAGNOSIS — C773 Secondary and unspecified malignant neoplasm of axilla and upper limb lymph nodes: Secondary | ICD-10-CM

## 2014-02-04 DIAGNOSIS — Z5189 Encounter for other specified aftercare: Secondary | ICD-10-CM

## 2014-02-04 MED ORDER — PEGFILGRASTIM INJECTION 6 MG/0.6ML
6.0000 mg | Freq: Once | SUBCUTANEOUS | Status: AC
  Administered 2014-02-04: 6 mg via SUBCUTANEOUS
  Filled 2014-02-04: qty 0.6

## 2014-02-04 NOTE — Telephone Encounter (Signed)
Left vm for pt to return call to f/u from 1st chemo treatment.  Contact information given.

## 2014-02-04 NOTE — Patient Instructions (Signed)

## 2014-02-04 NOTE — Telephone Encounter (Signed)
Message has been left by breast navigator to call us back

## 2014-02-04 NOTE — Progress Notes (Signed)
[name] Psychosocial Distress Screening Clinical Social Work  Clinical Social Work was referred by distress screening protocol.  The patient scored a 6 on the Psychosocial Distress Thermometer which indicates moderate distress. Clinical Social Worker has spoken with Pt in the past and phoned her to assess for distress and other psychosocial needs. CSW left message to call CSW as needed.    Clinical Social Worker follow up needed: no  If yes, follow up plan:   Madeline Davidson, [place] Worker [facility] for [place] Wednesday, Thursday and Friday Phone: [phone] Fax: [phone]

## 2014-02-04 NOTE — Telephone Encounter (Signed)
Madeline Davidson here for Neulasta injection following 1st tch/perj chemotherapy.  States that she is doing well, she even went to work at 6:00 am today.  No nausea, vomiting, or diarrhea.  Is drinking some fluids and eating.  Encouraged to drink more fluids.  All questions answered.  Knows to call if she has any problems or concerns.

## 2014-02-04 NOTE — Telephone Encounter (Signed)
Message copied by Verlon Setting on Fri Feb 04, 2014 12:59 PM ------      Message from: Ricarda Frame C      Created: Thu Feb 03, 2014  4:58 PM      Regarding: chemo f/u call       1st time Herceptin, Perjeta, Taxotere and Carboplatin.  No s/s of any reaction.  ------

## 2014-02-07 ENCOUNTER — Ambulatory Visit (HOSPITAL_COMMUNITY): Payer: BC Managed Care – PPO

## 2014-02-10 ENCOUNTER — Ambulatory Visit (HOSPITAL_BASED_OUTPATIENT_CLINIC_OR_DEPARTMENT_OTHER): Payer: BC Managed Care – PPO | Admitting: Oncology

## 2014-02-10 ENCOUNTER — Encounter (HOSPITAL_COMMUNITY): Payer: Self-pay

## 2014-02-10 ENCOUNTER — Other Ambulatory Visit (HOSPITAL_BASED_OUTPATIENT_CLINIC_OR_DEPARTMENT_OTHER): Payer: BC Managed Care – PPO

## 2014-02-10 ENCOUNTER — Encounter: Payer: Self-pay | Admitting: Oncology

## 2014-02-10 ENCOUNTER — Ambulatory Visit (HOSPITAL_COMMUNITY)
Admission: RE | Admit: 2014-02-10 | Discharge: 2014-02-10 | Disposition: A | Discharge status: Home / Self Care | Payer: BC Managed Care – PPO | Source: Ambulatory Visit | Attending: Internal Medicine | Admitting: Internal Medicine

## 2014-02-10 VITALS — BP 108/60 | HR 104 | Ht 64.0 in | Wt 202.0 lb

## 2014-02-10 VITALS — BP 118/75 | HR 106 | Temp 99.7°F | Resp 18 | Ht 64.0 in | Wt 200.0 lb

## 2014-02-10 DIAGNOSIS — I10 Essential (primary) hypertension: Secondary | ICD-10-CM

## 2014-02-10 DIAGNOSIS — C50919 Malignant neoplasm of unspecified site of unspecified female breast: Secondary | ICD-10-CM

## 2014-02-10 DIAGNOSIS — C773 Secondary and unspecified malignant neoplasm of axilla and upper limb lymph nodes: Secondary | ICD-10-CM

## 2014-02-10 DIAGNOSIS — C50519 Malignant neoplasm of lower-outer quadrant of unspecified female breast: Secondary | ICD-10-CM

## 2014-02-10 LAB — CBC WITH DIFFERENTIAL/PLATELET
BASO%: 0.2 % (ref 0.0–2.0)
Basophils Absolute: 0 10*3/uL (ref 0.0–0.1)
EOS%: 0.2 % (ref 0.0–7.0)
Eosinophils Absolute: 0 10*3/uL (ref 0.0–0.5)
HEMATOCRIT: 43.4 % (ref 34.8–46.6)
HEMOGLOBIN: 14.8 g/dL (ref 11.6–15.9)
LYMPH#: 1 10*3/uL (ref 0.9–3.3)
LYMPH%: 23.7 % (ref 14.0–49.7)
MCH: 28.4 pg (ref 25.1–34.0)
MCHC: 34.1 g/dL (ref 31.5–36.0)
MCV: 83.1 fL (ref 79.5–101.0)
MONO#: 0.8 10*3/uL (ref 0.1–0.9)
MONO%: 20.1 % — AB (ref 0.0–14.0)
NEUT#: 2.3 10*3/uL (ref 1.5–6.5)
NEUT%: 55.8 % (ref 38.4–76.8)
Platelets: 185 10*3/uL (ref 145–400)
RBC: 5.22 10*6/uL (ref 3.70–5.45)
RDW: 13.3 % (ref 11.2–14.5)
WBC: 4.1 10*3/uL (ref 3.9–10.3)
nRBC: 0 % (ref 0–0)

## 2014-02-10 LAB — COMPREHENSIVE METABOLIC PANEL (CC13)
ALK PHOS: 86 U/L (ref 40–150)
ALT: 26 U/L (ref 0–55)
AST: 16 U/L (ref 5–34)
Albumin: 3.8 g/dL (ref 3.5–5.0)
Anion Gap: 12 mEq/L — ABNORMAL HIGH (ref 3–11)
BUN: 19.3 mg/dL (ref 7.0–26.0)
CO2: 22 mEq/L (ref 22–29)
Calcium: 9.6 mg/dL (ref 8.4–10.4)
Chloride: 102 mEq/L (ref 98–109)
Creatinine: 0.9 mg/dL (ref 0.6–1.1)
Glucose: 137 mg/dl (ref 70–140)
POTASSIUM: 3.9 meq/L (ref 3.5–5.1)
Sodium: 136 mEq/L (ref 136–145)
Total Bilirubin: 0.53 mg/dL (ref 0.20–1.20)
Total Protein: 6.9 g/dL (ref 6.4–8.3)

## 2014-02-10 NOTE — Progress Notes (Signed)
Patient ID: Madeline Davidson, female   DOB: January 26, 1956, 58 y.o.   MRN: [id number]\ Oncologist: Dr Humphrey Rolls  PCP: Dr Concha Pyo  HPI:  Madeline Davidson is a 58 year old female with HTN, obesity and right breast cancer in the lower outer quadrant stage II (T2, N1). She referred to the cardio-oncology clinic by Dr Humphrey Rolls   She started chemo 02/03/14 TCH/Perjeta every 21 days for 6 cycles. Followed by Herceptin every 3 weeks for 1 year.   Denies h/o known cardiac disease. Complains of fatigue. Does not exercise. Denies CP, DOE, edema.    ECHO 01/24/14 EF 60-65% Lateral S' 9.8  FH: Uncle has HF. Mom has HTN  SH: Lives with her Mom. Works full time [employer]     Review of Systems:     Cardiac Review of Systems: {Y] = yes [ ]  = no  Chest Pain [    ]  Resting SOB [   ] Exertional SOB  [  ]  Orthopnea [  ]   Pedal Edema [   ]    Palpitations [  ] Syncope  [  ]   Presyncope [   ]  General Review of Systems: [Y] = yes [  ]=no Constitional: recent weight change [  ]; anorexia [  ]; fatigue [ Y ]; nausea [  ]; night sweats [  ]; fever [  ]; or chills [  ];                                                                                                                                          Dental: poor dentition[  ]; Last Dentist visit:   Eye : blurred vision [  ]; diplopia [   ]; vision changes [  ];  Amaurosis fugax[  ]; Resp: cough [  ];  wheezing[  ];  hemoptysis[  ]; shortness of breath[  ]; paroxysmal nocturnal dyspnea[  ]; dyspnea on exertion[  ]; or orthopnea[  ];  GI:  gallstones[  ], vomiting[  ];  dysphagia[  ]; melena[  ];  hematochezia [  ]; heartburn[  ];   Hx of  Colonoscopy[  ]; GU: kidney stones [  ]; hematuria[  ];   dysuria [  ];  nocturia[  ];  history of     obstruction [  ];                 Skin: rash, swelling[  ];, hair loss[  ];  peripheral edema[  ];  or itching[  ]; Musculosketetal: myalgias[  ];  joint swelling[  ];  joint erythema[  ];  joint pain[  ];  back pain[  ];  Heme/Lymph: bruising[   ];  bleeding[  ];  anemia[  ];  Neuro: TIA[  ];  headaches[  ];  stroke[  ];  vertigo[  ];  seizures[  ];  paresthesias[  ];  difficulty walking[  ];  Psych:depression[  ]; anxiety[  ];  Endocrine: diabetes[  ];  thyroid dysfunction[  ];  Other:    Past Medical History  Diagnosis Date  . 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 Hypertension   . Allergy   . Hypertension 01/10/2014  . Wears glasses     Current Outpatient Prescriptions  Medication Sig Dispense Refill  . cetirizine (ZYRTEC) 10 MG tablet Take 10 mg by mouth daily.      . clonazePAM (KLONOPIN) 0.5 MG tablet Take 0.5 mg by mouth as needed for anxiety (take half a tablet as needed).      Marland Kitchen dexamethasone (DECADRON) 4 MG tablet Take 2 tablets (8 mg total) by mouth 2 (two) times daily with a meal. Take two times a day the day before Taxotere. Then take two times a day starting the day after chemo for 3 days.  30 tablet  1  . ibuprofen (ADVIL,MOTRIN) 800 MG tablet Take 800 mg by mouth as needed.      . lidocaine-prilocaine (EMLA) cream Apply topically as needed.  30 g  6  . lisinopril-hydrochlorothiazide (PRINZIDE,ZESTORETIC) 10-12.5 MG per tablet Take 1 tablet by mouth daily.      Marland Kitchen LORazepam (ATIVAN) 0.5 MG tablet Take 1 tablet (0.5 mg total) by mouth every 6 (six) hours as needed (Nausea or vomiting).  30 tablet  0  . ondansetron (ZOFRAN) 8 MG tablet Take 1 tablet (8 mg total) by mouth 2 (two) times daily. Take two times a day starting the day after chemo for 3 days. Then take two times a day as needed for nausea or vomiting.  30 tablet  1  . prochlorperazine (COMPAZINE) 10 MG tablet Take 1 tablet (10 mg total) by mouth every 6 (six) hours as needed (Nausea or vomiting).  30 tablet  1  . zolpidem (AMBIEN) 10 MG tablet Take 10 mg by mouth at bedtime as needed for sleep (take half a tablet at night).       No current facility-administered medications for this encounter.    Facility-Administered Medications Ordered in Other Encounters  Medication Dose Route Frequency Provider Last Rate Last Dose  . sodium chloride 0.9 % injection 10 mL  10 mL Intracatheter PRN [name], MD   10 mL at 02/03/14 1759     Allergies  Allergen Reactions  . Ciprofloxacin Other (See Comments)    Patient had a headache after taking Cipro. Made her sinus infection symptoms worse.    History   Social History  . Marital Status: Divorced    Spouse Name: N/A    Number of Children: N/A  . Years of Education: N/A   Occupational History  . Not on file.   Social History Main Topics  . Smoking status: Never Smoker   . Smokeless tobacco: Never Used  . Alcohol Use: No  . Drug Use: No  . Sexual Activity: Not Currently   Other Topics Concern  . Not on file   Social History Narrative  . No narrative on file    Family History  Problem Relation Age of Onset  . Cancer Father 39    colon cancer  . Hypertension Mother     PHYSICAL EXAM: Filed Vitals:   02/10/14 1434  BP: 108/60  Pulse: 104   General:  Well appearing. No respiratory difficulty HEENT: normal Neck: supple. no JVD. Carotids 2+  bilat; no bruits. No lymphadenopathy or thryomegaly appreciated. Cor: PMI nondisplaced. Regular rate & rhythm. No rubs, gallops or murmurs. Lungs: clear Abdomen: soft, nontender, nondistended. No hepatosplenomegaly. No bruits or masses. Good bowel sounds. Extremities: no cyanosis, clubbing, rash, edema Neuro: alert & oriented x 3, cranial nerves grossly intact. moves all 4 extremities w/o difficulty. Affect pleasant.    Results for orders placed in visit on 02/10/14 (from the past 24 hour(s))  CBC WITH DIFFERENTIAL     Status: Abnormal   Collection Time    02/10/14 11:07 AM      Result Value Ref Range   WBC 4.1  3.9 - 10.3 10e3/uL   NEUT# 2.3  1.5 - 6.5 10e3/uL   HGB 14.8  11.6 - 15.9 g/dL   HCT 43.4  34.8 - 46.6 %   Platelets 185  145 - 400 10e3/uL   MCV 83.1  79.5  - 101.0 fL   MCH 28.4  25.1 - 34.0 pg   MCHC 34.1  31.5 - 36.0 g/dL   RBC 5.22  3.70 - 5.45 10e6/uL   RDW 13.3  11.2 - 14.5 %   lymph# 1.0  0.9 - 3.3 10e3/uL   MONO# 0.8  0.1 - 0.9 10e3/uL   Eosinophils Absolute 0.0  0.0 - 0.5 10e3/uL   Basophils Absolute 0.0  0.0 - 0.1 10e3/uL   NEUT% 55.8  38.4 - 76.8 %   LYMPH% 23.7  14.0 - 49.7 %   MONO% 20.1 (*) 0.0 - 14.0 %   EOS% 0.2  0.0 - 7.0 %   BASO% 0.2  0.0 - 2.0 %   nRBC 0  0 - 0 %   Narrative:    Performed At:  [facility]               [address]               [place], [place] [address]  COMPREHENSIVE METABOLIC PANEL (0000000)     Status: Abnormal   Collection Time    02/10/14 11:07 AM      Result Value Ref Range   Sodium 136  136 - 145 mEq/L   Potassium 3.9  3.5 - 5.1 mEq/L   Chloride 102  98 - 109 mEq/L   CO2 22  22 - 29 mEq/L   Glucose 137  70 - 140 mg/dl   BUN 19.3  7.0 - 26.0 mg/dL   Creatinine 0.9  0.6 - 1.1 mg/dL   Total Bilirubin 0.53  0.20 - 1.20 mg/dL   Alkaline Phosphatase 86  40 - 150 U/L   AST 16  5 - 34 U/L   ALT 26  0 - 55 U/L   Total Protein 6.9  6.4 - 8.3 g/dL   Albumin 3.8  3.5 - 5.0 g/dL   Calcium 9.6  8.4 - 10.4 mg/dL   Anion Gap 12 (*) 3 - 11 mEq/L   Narrative:    Performed At:  [facility] [address].               [place], [place] [address]   No results found.   ASSESSMENT & PLAN: 1. R Breast Cancer- Receiving TCH/Perjeta every 21 days for 6 cycles.   Dr Haroldine Laws reviewed and discussed ECHO. Discussed the purpose of cardio-onc clinic. Explained that there is ~10% chance of cardiotoxicity associated with Herceptin.  Plan to follow every 3 months with an ECHO.   Follow up in 3 months with an ECHO.   CLEGG,AMY 3:31 PM  Patient seen and examined with Darrick Grinder, NP. We discussed all aspects of the encounter. I agree with the assessment and plan as stated above.   Explained incidence of Herceptin +/- perjeta cardiotoxicity and role of Cardio-oncology clinic  at length. Echo images reviewed personally. All parameters stable. Reviewed signs and symptoms of HF to look for. Continue Herceptin/perjeta. Follow-up with echo in 3 months.  Ariyan Sinnett,MD 4:18 PM

## 2014-02-10 NOTE — Patient Instructions (Signed)
Follow up in 3 months with an ECHO 

## 2014-02-10 NOTE — Patient Instructions (Signed)
Doing well  We will see you back in 2 weeks for next treamtent

## 2014-02-13 ENCOUNTER — Encounter: Payer: Self-pay | Admitting: Oncology

## 2014-02-13 NOTE — Progress Notes (Signed)
OFFICE PROGRESS NOTE  CC  Madeline Amass, MD [address] [place] [address] Dr. Rolm Bookbinder Dr. Thea Silversmith  DIAGNOSIS: 58 year old female with new diagnosis of right breast cancer in the lower outer quadrant diagnosed December 2014.  STAGE:  Breast cancer  Primary site: Breast (Right)  Staging method: AJCC 7th Edition  Clinical: Stage IIB (T2, N1, cM0) signed by [name], MD on 01/10/2014 5:01 PM  Summary: Stage IIB (T2, N1, cM0)   PRIOR THERAPY: #1 Patient on 12/16/2013 felt a lump in her right breast. She states it was nickel size. Because of this she 1 on to see her nurse practitioner. On 12/31/2013 a mammogram was ordered. This showed a lesion in the 7:00 position measuring 3.3 cm. She was also noted to have enlarged lymph nodes. Subsequent ultrasound showed a 2.2 x 2.4 cm mass with enlarged lymph node measuring 1.6 cm. This was biopsied in [place]. 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 Lymph node was positive for metastatic disease. Prognostic markers revealed the tumor to be estrogen receptor +90% progesterone receptor 1% HER-2/neu positive  #2 staging PET/CT performed on 93/71/6967: Hypermetabolic mass posterior right breast with SUV max 13.6 and  measuring 13.2 cm (image 108). There two 12 in 16 mm hypermetabolic  nodules in the posterior right breast versus lymph nodes with  intense metabolic activity SUV max 10.9. No hypermetabolic  supraclavicular internal mammary nodes. No hypermetabolic  mediastinal lymph nodes. No suspicious pulmonary nodules.  ABDOMEN/PELVIS  There are 3 hypermetabolic periportal lymph nodes. The largest  measures 20 mm with SUV max 13.6. Additional small less than 10 mm  short axis nodes on image 144 and 130 also have intense metabolic  activity. No abnormal metabolic activity within the liver. No  hypermetabolic abdominal pelvic lymph nodes.  #3 patient has been seen by Dr. Rolm Bookbinder and Dr. Thea Silversmith in consultation.  CURRENT THERAPY: Neoadjuvant Cycle 1 day 8 of TCH/perjeta  INTERVAL HISTORY: Madeline Davidson 58 y.o. female returns for followup visit after cycle 1 of TCH/P Overall she's doing well. She is accompanied by her mother. She tolerated the first of her chemotherapy well.  She denies any headaches double vision blurring of vision no fevers chills or night sweats no abdominal pain. She does have Port-A-Cath it does not hurt her is in good position. No evidence of infections. She has no peripheral paresthesias no diarrhea or constipation. Remainder of the 10 point review of systems is negative.   MEDICAL HISTORY: Past Medical History  Diagnosis Date  . 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 Hypertension   . Allergy   . Hypertension 01/10/2014  . Wears glasses     ALLERGIES:  is allergic to ciprofloxacin.  MEDICATIONS:  Current Outpatient Prescriptions  Medication Sig Dispense Refill  . cetirizine (ZYRTEC) 10 MG tablet Take 10 mg by mouth daily.      . clonazePAM (KLONOPIN) 0.5 MG tablet Take 0.5 mg by mouth as needed for anxiety (take half a tablet as needed).      Marland Kitchen dexamethasone (DECADRON) 4 MG tablet Take 2 tablets (8 mg total) by mouth 2 (two) times daily with a meal. Take two times a day the day before Taxotere. Then take two times a day starting the day after chemo for 3 days.  30 tablet  1  . ibuprofen (ADVIL,MOTRIN) 800 MG tablet Take 800 mg by mouth as  needed.      . lidocaine-prilocaine (EMLA) cream Apply topically as needed.  30 g  6  . lisinopril-hydrochlorothiazide (PRINZIDE,ZESTORETIC) 10-12.5 MG per tablet Take 1 tablet by mouth daily.      Marland Kitchen LORazepam (ATIVAN) 0.5 MG tablet Take 1 tablet (0.5 mg total) by mouth every 6 (six) hours as needed (Nausea or vomiting).  30 tablet  0  . ondansetron (ZOFRAN) 8 MG tablet Take 1 tablet (8 mg total) by mouth 2 (two) times daily. Take  two times a day starting the day after chemo for 3 days. Then take two times a day as needed for nausea or vomiting.  30 tablet  1  . prochlorperazine (COMPAZINE) 10 MG tablet Take 1 tablet (10 mg total) by mouth every 6 (six) hours as needed (Nausea or vomiting).  30 tablet  1  . zolpidem (AMBIEN) 10 MG tablet Take 10 mg by mouth at bedtime as needed for sleep (take half a tablet at night).       No current facility-administered medications for this visit.   Facility-Administered Medications Ordered in Other Visits  Medication Dose Route Frequency Provider Last Rate Last Dose  . sodium chloride 0.9 % injection 10 mL  10 mL Intracatheter PRN [name], MD   10 mL at 02/03/14 1759    SURGICAL HISTORY:  Past Surgical History  Procedure Laterality Date  . Colon resection  2004  . Hernia repair  2005    umb  . Cholecystectomy  2005  . Portacath placement Left 01/31/2014    Procedure: INSERTION PORT-A-CATH;  Surgeon: Rolm Bookbinder, MD;  Location: [facility] OR;  Service: General;  Laterality: Left;    REVIEW OF SYSTEMS:  Pertinent items are noted in HPI.     PHYSICAL EXAMINATION: Blood pressure 118/75, pulse 106, temperature 99.7 F (37.6 C), temperature source Oral, resp. rate 18, height $RemoveBe'5\' 4"'bNaZCOcqB$  (1.626 m), weight 200 lb (90.719 kg). Body mass index is 34.31 kg/(m^2). ECOG PERFORMANCE STATUS: 0 - Asymptomatic   General appearance: alert, cooperative and appears stated age Neck: no adenopathy, no carotid bruit, no JVD, supple, symmetrical, trachea midline and thyroid not enlarged, symmetric, no tenderness/mass/nodules Lymph nodes: Cervical, supraclavicular, and axillary nodes normal. Resp: clear to auscultation bilaterally Back: symmetric, no curvature. ROM normal. No CVA tenderness. Cardio: regular rate and rhythm GI: soft, non-tender; bowel sounds normal; no masses,  no organomegaly Extremities: extremities normal, atraumatic, no cyanosis or edema Neurologic: Grossly normal Breasts:  left breast normal without mass, skin or nipple changes or axillary nodes, abnormal mass palpable right breast.   LABORATORY DATA: Lab Results  Component Value Date   WBC 4.1 02/10/2014   HGB 14.8 02/10/2014   HCT 43.4 02/10/2014   MCV 83.1 02/10/2014   PLT 185 02/10/2014      Chemistry      Component Value Date/Time   NA 136 02/10/2014 1107   NA 142 01/31/2014 1143   K 3.9 02/10/2014 1107   K 4.4 01/31/2014 1143   CL 104 01/31/2014 1143   CO2 22 02/10/2014 1107   CO2 24 01/31/2014 1143   BUN 19.3 02/10/2014 1107   BUN 18 01/31/2014 1143   CREATININE 0.9 02/10/2014 1107   CREATININE 0.72 01/31/2014 1143      Component Value Date/Time   CALCIUM 9.6 02/10/2014 1107   CALCIUM 9.2 01/31/2014 1143   ALKPHOS 86 02/10/2014 1107   AST 16 02/10/2014 1107   ALT 26 02/10/2014 1107   BILITOT 0.53 02/10/2014 1107  Diagnosis 1. Consult Slide , Right Breast Biopsy - INVASIVE DUCTAL CARCINOMA. - SEE COMMENT. 2. Consult Slide , Right lymph node - METASTATIC CARCINOMA IN 1 OF 1 LYMPH NODE (1/1). Microscopic Comment 1. The carcinoma is at least grade II. Per outside report, the tumor is positive for estrogen receptor (90% of tumor cells show strong nuclear staining) and positive for progesterone receptor (1% of tumor cells show weak nuclear staining). The tumor cells are positive for Her 2 amplification by immunohistochemistry. (JBK:kh 01-13-14) Pecola Leisure MD Pathologist, Electronic Signature (Case signed 01/13/2014) Specimen Gross and Clinical Information Specimen(s) Obtained: 1. Consult Slide , Right Breast Biopsy 2. Consult Slide , Right lymph node Specimen Clinical Information 1. (tl) 2. (tl) Gross 1. Received from [facility]  RADIOGRAPHIC STUDIES:  Ct Chest W Contrast  01/20/2014   CLINICAL DATA:  Initial staging of breast cancer. Node positive right breast cancer.  EXAM: CT CHEST, ABDOMEN, AND PELVIS WITH CONTRAST  TECHNIQUE: Multidetector CT imaging of the chest, abdomen and pelvis  was performed following the standard protocol during bolus administration of intravenous contrast.  CONTRAST:  OMNIPAQUE IOHEXOL 300 MG/ML  SOLN  COMPARISON:  NM PET IMAGE INITIAL (PI) SKULL BASE TO THIGH dated 01/20/2014  FINDINGS: CT CHEST FINDINGS  There is a mass within the inferior lateral right breast measuring 2.5 cm. More superiorly there 2 adjacent masses versus lymph nodes in the posterior aspect of the right breast measuring 19 and 18 mm. Small sub pectoral lymph node measures 5 mm.  No infraclavicular or internal mammary lymphadenopathy. No mediastinal or hilar lymphadenopathy. No central pulmonary embolism. No pericardial fluid.  View of the lung parenchyma demonstrates no pulmonary nodules.  CT ABDOMEN AND PELVIS FINDINGS  No focal hepatic lesion. Pneumobilia within the left hepatic lobe suggesting prior sphincterotomy. There is enlarged periportal lymph node measuring 21 mm (image 66, series 2). Smaller lymph node measuring 8 mm (image 62) is not pathologic by size criteria. The pancreas, spleen, adrenal glands, and kidneys are normal.  The stomach, small bowel, and colon demonstrate no evidence of obstruction or mass. There are at least 3 ventral hernias. The medial hernia sacs contains the the terminal ileum and cecum. The lateral hernia sac contains a long segment of small bowel. The more superior hernia sac contains a short segment of transverse colon  Abdominal aorta is normal caliber. No retroperitoneal periportal lymphadenopathy. . No free fluid the pelvis. The uterus, ovaries, and bladder normal. No pelvic lymphadenopathy. No aggressive osseous lesion.  IMPRESSION: 1. Mass in the posterior lateral right breast consists with primary carcinoma. 2. Two  additional right breast lesions versus nodal metastasis. 3. No evidence of central nodal metastasis or mediastinal nodal metastasis. 4. Single enlarged periportal lymph node. Of note this lymph node was hypermetabolic on comparison PET-CT  scan. 5. Multiple ventral hernias contain nonobstructed segments of small and large bowel.   Electronically Signed   By: Genevive Bi M.D.   On: 01/20/2014 15:37   Ct Abdomen Pelvis W Contrast  01/20/2014   CLINICAL DATA:  Initial staging of breast cancer. Node positive right breast cancer.  EXAM: CT CHEST, ABDOMEN, AND PELVIS WITH CONTRAST  TECHNIQUE: Multidetector CT imaging of the chest, abdomen and pelvis was performed following the standard protocol during bolus administration of intravenous contrast.  CONTRAST:  OMNIPAQUE IOHEXOL 300 MG/ML  SOLN  COMPARISON:  NM PET IMAGE INITIAL (PI) SKULL BASE TO THIGH dated 01/20/2014  FINDINGS: CT CHEST FINDINGS  There is a mass  within the inferior lateral right breast measuring 2.5 cm. More superiorly there 2 adjacent masses versus lymph nodes in the posterior aspect of the right breast measuring 19 and 18 mm. Small sub pectoral lymph node measures 5 mm.  No infraclavicular or internal mammary lymphadenopathy. No mediastinal or hilar lymphadenopathy. No central pulmonary embolism. No pericardial fluid.  View of the lung parenchyma demonstrates no pulmonary nodules.  CT ABDOMEN AND PELVIS FINDINGS  No focal hepatic lesion. Pneumobilia within the left hepatic lobe suggesting prior sphincterotomy. There is enlarged periportal lymph node measuring 21 mm (image 66, series 2). Smaller lymph node measuring 8 mm (image 62) is not pathologic by size criteria. The pancreas, spleen, adrenal glands, and kidneys are normal.  The stomach, small bowel, and colon demonstrate no evidence of obstruction or mass. There are at least 3 ventral hernias. The medial hernia sacs contains the the terminal ileum and cecum. The lateral hernia sac contains a long segment of small bowel. The more superior hernia sac contains a short segment of transverse colon  Abdominal aorta is normal caliber. No retroperitoneal periportal lymphadenopathy. . No free fluid the pelvis. The uterus, ovaries,  and bladder normal. No pelvic lymphadenopathy. No aggressive osseous lesion.  IMPRESSION: 1. Mass in the posterior lateral right breast consists with primary carcinoma. 2. Two  additional right breast lesions versus nodal metastasis. 3. No evidence of central nodal metastasis or mediastinal nodal metastasis. 4. Single enlarged periportal lymph node. Of note this lymph node was hypermetabolic on comparison PET-CT scan. 5. Multiple ventral hernias contain nonobstructed segments of small and large bowel.   Electronically Signed   By: Suzy Bouchard M.D.   On: 01/20/2014 15:37   Nm Pet Image Initial (pi) Skull Base To Thigh  01/20/2014   CLINICAL DATA:  Initial treatment strategy for breast carcinoma.  EXAM: NUCLEAR MEDICINE PET SKULL BASE TO THIGH  FASTING BLOOD GLUCOSE:  Value: $Remov'99mg'plyQsq$ /dl  TECHNIQUE: 17.6 mCi F-18 FDG was injected intravenously. CT data was obtained and used for attenuation correction and anatomic localization only. (This was not acquired as a diagnostic CT examination.) Additional exam technical data entered on technologist worksheet.  COMPARISON:  None.  FINDINGS: NECK  No hypermetabolic lymph nodes in the neck.  CHEST  Hypermetabolic mass posterior right breast with SUV max 13.6 and measuring 13.2 cm (image 108). There two 12 in 16 mm hypermetabolic nodules in the posterior right breast versus lymph nodes with intense metabolic activity SUV max 10.9. No hypermetabolic supraclavicular internal mammary nodes. No hypermetabolic mediastinal lymph nodes. No suspicious pulmonary nodules.  ABDOMEN/PELVIS  There are 3 hypermetabolic periportal lymph nodes. The largest measures 20 mm with SUV max 13.6. Additional small less than 10 mm short axis nodes on image 144 and 130 also have intense metabolic activity. No abnormal metabolic activity within the liver. No hypermetabolic abdominal pelvic lymph nodes.  SKELETON  No focal hypermetabolic activity to suggest skeletal metastasis.  IMPRESSION: 1.  Hypermetabolic mass in the posterior right breast consists with primary breast carcinoma. 2. Two hypermetabolic nodules in the deep right breast either represents additional foci of breast cancers or metastatic lymph nodes. 3. No evidence of central hypermetabolic nodes or mediastinal nodes. 4. Hypermetabolic metastatic lymph nodes within the porta hepatis.   Electronically Signed   By: Suzy Bouchard M.D.   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 Patient has had staging studies performed that revealed 2 additional areas of concern in the right breast as well as some periportal lymphadenopathy. Patient had staging studies performed including a PET scan that did show hypermetabolic mass in the posterior right breast consistent with breast primary. There were also noted to be 2 hypermetabolic nodules in the deep right breast either representing additional foci of breast cancer or metastatic lymph nodes. There was no evidence of central hypermetabolic nodes or mediastinal nodes. There were noted to be a hypermetabolic metastatic lymph nodes within the porta hepatis. MRI of the breasts performed on 02/01/2014 revealed nuclear mass in the lateral right breast history 1 measurement 3.8 cm on the biopsy-proven invasive carcinoma. 2 intramammary lymph nodes in the upper outer right breast with abnormal morphology. One of which was biopsy-proven to represent metastatic carcinoma. There is indeterminate 0.7 cm level I right axillary lymph node procedure to [place] muscle and anterior to Trellis minor muscle. Indeterminate 0.8 cm lower mediastinal right paracardiac lymph node. These nodes were not hypermetabolic on recent PET scan. There was no evidence of malignancy in the left breast.  #2 s/p cycle 1 day 8 of TCH/Perjeta today. She  tolerated the treatment well. Very minimal side effects reported  #3 hypertension: This is under reasonable control.  #3 anti-emetic usage: She does have Zofran Compazine dexamethasone as well as Ativan at her disposal.  #4 Follow up: patient will be seen back in 2 week for followup and labs and cycle 2 of TCH/P  All questions were answered. The patient knows to call the clinic with any problems, questions or concerns. We can certainly see the patient much sooner if necessary.  I spent 25 minutes counseling the patient face to face. The total time spent in the appointment was 30 minutes.    Marcy Panning, MD Medical/Oncology [facility] [phone] (beeper) [phone] (Office)  02/13/2014, 1:38 PM

## 2014-02-24 ENCOUNTER — Ambulatory Visit (HOSPITAL_BASED_OUTPATIENT_CLINIC_OR_DEPARTMENT_OTHER): Payer: BC Managed Care – PPO

## 2014-02-24 ENCOUNTER — Ambulatory Visit (HOSPITAL_BASED_OUTPATIENT_CLINIC_OR_DEPARTMENT_OTHER): Payer: BC Managed Care – PPO | Admitting: Oncology

## 2014-02-24 ENCOUNTER — Other Ambulatory Visit (HOSPITAL_BASED_OUTPATIENT_CLINIC_OR_DEPARTMENT_OTHER): Payer: BC Managed Care – PPO

## 2014-02-24 ENCOUNTER — Encounter: Payer: Self-pay | Admitting: Oncology

## 2014-02-24 VITALS — BP 107/66 | HR 89 | Temp 97.3°F | Resp 18 | Ht 64.0 in | Wt 203.8 lb

## 2014-02-24 DIAGNOSIS — Z5112 Encounter for antineoplastic immunotherapy: Secondary | ICD-10-CM

## 2014-02-24 DIAGNOSIS — C50519 Malignant neoplasm of lower-outer quadrant of unspecified female breast: Secondary | ICD-10-CM

## 2014-02-24 DIAGNOSIS — C50919 Malignant neoplasm of unspecified site of unspecified female breast: Secondary | ICD-10-CM

## 2014-02-24 DIAGNOSIS — E860 Dehydration: Secondary | ICD-10-CM

## 2014-02-24 DIAGNOSIS — R42 Dizziness and giddiness: Secondary | ICD-10-CM

## 2014-02-24 DIAGNOSIS — C773 Secondary and unspecified malignant neoplasm of axilla and upper limb lymph nodes: Secondary | ICD-10-CM

## 2014-02-24 DIAGNOSIS — Z5111 Encounter for antineoplastic chemotherapy: Secondary | ICD-10-CM

## 2014-02-24 DIAGNOSIS — Z170 Estrogen receptor positive status [ER+]: Secondary | ICD-10-CM

## 2014-02-24 LAB — COMPREHENSIVE METABOLIC PANEL (CC13)
ALBUMIN: 3.5 g/dL (ref 3.5–5.0)
ALT: 20 U/L (ref 0–55)
AST: 13 U/L (ref 5–34)
Alkaline Phosphatase: 88 U/L (ref 40–150)
Anion Gap: 11 mEq/L (ref 3–11)
BUN: 18.9 mg/dL (ref 7.0–26.0)
CALCIUM: 9.9 mg/dL (ref 8.4–10.4)
CHLORIDE: 108 meq/L (ref 98–109)
CO2: 25 mEq/L (ref 22–29)
Creatinine: 0.8 mg/dL (ref 0.6–1.1)
Glucose: 170 mg/dl — ABNORMAL HIGH (ref 70–140)
POTASSIUM: 3.9 meq/L (ref 3.5–5.1)
Sodium: 144 mEq/L (ref 136–145)
TOTAL PROTEIN: 6.8 g/dL (ref 6.4–8.3)
Total Bilirubin: 0.21 mg/dL (ref 0.20–1.20)

## 2014-02-24 LAB — CBC WITH DIFFERENTIAL/PLATELET
BASO%: 0.4 % (ref 0.0–2.0)
Basophils Absolute: 0 10*3/uL (ref 0.0–0.1)
EOS ABS: 0 10*3/uL (ref 0.0–0.5)
EOS%: 0 % (ref 0.0–7.0)
HCT: 33.5 % — ABNORMAL LOW (ref 34.8–46.6)
HGB: 11.4 g/dL — ABNORMAL LOW (ref 11.6–15.9)
LYMPH#: 0.7 10*3/uL — AB (ref 0.9–3.3)
LYMPH%: 7.4 % — AB (ref 14.0–49.7)
MCH: 29.1 pg (ref 25.1–34.0)
MCHC: 34 g/dL (ref 31.5–36.0)
MCV: 85.8 fL (ref 79.5–101.0)
MONO#: 0.3 10*3/uL (ref 0.1–0.9)
MONO%: 3.1 % (ref 0.0–14.0)
NEUT%: 89.1 % — ABNORMAL HIGH (ref 38.4–76.8)
NEUTROS ABS: 8 10*3/uL — AB (ref 1.5–6.5)
PLATELETS: 272 10*3/uL (ref 145–400)
RBC: 3.9 10*6/uL (ref 3.70–5.45)
RDW: 14 % (ref 11.2–14.5)
WBC: 9 10*3/uL (ref 3.9–10.3)

## 2014-02-24 MED ORDER — SODIUM CHLORIDE 0.9 % IV SOLN
6.0000 mg/kg | Freq: Once | INTRAVENOUS | Status: AC
  Administered 2014-02-24: 567 mg via INTRAVENOUS
  Filled 2014-02-24: qty 27

## 2014-02-24 MED ORDER — HEPARIN SOD (PORK) LOCK FLUSH 100 UNIT/ML IV SOLN
500.0000 [IU] | Freq: Once | INTRAVENOUS | Status: AC | PRN
  Administered 2014-02-24: 500 [IU]
  Filled 2014-02-24: qty 5

## 2014-02-24 MED ORDER — SODIUM CHLORIDE 0.9 % IV SOLN
Freq: Once | INTRAVENOUS | Status: AC
  Administered 2014-02-24: 11:00:00 via INTRAVENOUS

## 2014-02-24 MED ORDER — SODIUM CHLORIDE 0.9 % IV SOLN
420.0000 mg | Freq: Once | INTRAVENOUS | Status: AC
  Administered 2014-02-24: 420 mg via INTRAVENOUS
  Filled 2014-02-24: qty 14

## 2014-02-24 MED ORDER — CARBOPLATIN CHEMO INJECTION 600 MG/60ML
840.0000 mg | Freq: Once | INTRAVENOUS | Status: AC
  Administered 2014-02-24: 840 mg via INTRAVENOUS
  Filled 2014-02-24: qty 84

## 2014-02-24 MED ORDER — SODIUM CHLORIDE 0.9 % IV SOLN
75.0000 mg/m2 | Freq: Once | INTRAVENOUS | Status: AC
  Administered 2014-02-24: 150 mg via INTRAVENOUS
  Filled 2014-02-24: qty 15

## 2014-02-24 MED ORDER — ONDANSETRON 16 MG/50ML IVPB (CHCC)
INTRAVENOUS | Status: AC
Start: 2014-02-24 — End: 2014-02-24
  Filled 2014-02-24: qty 16

## 2014-02-24 MED ORDER — DIPHENHYDRAMINE HCL 25 MG PO CAPS
ORAL_CAPSULE | ORAL | Status: AC
  Filled 2014-02-24: qty 2

## 2014-02-24 MED ORDER — DEXAMETHASONE SODIUM PHOSPHATE 20 MG/5ML IJ SOLN
INTRAMUSCULAR | Status: AC
  Filled 2014-02-24: qty 5

## 2014-02-24 MED ORDER — ONDANSETRON 16 MG/50ML IVPB (CHCC)
16.0000 mg | Freq: Once | INTRAVENOUS | Status: AC
  Administered 2014-02-24: 16 mg via INTRAVENOUS

## 2014-02-24 MED ORDER — ACETAMINOPHEN 325 MG PO TABS
650.0000 mg | ORAL_TABLET | Freq: Once | ORAL | Status: AC
  Administered 2014-02-24: 650 mg via ORAL

## 2014-02-24 MED ORDER — ACETAMINOPHEN 325 MG PO TABS
ORAL_TABLET | ORAL | Status: AC
  Filled 2014-02-24: qty 2

## 2014-02-24 MED ORDER — DIPHENHYDRAMINE HCL 25 MG PO CAPS
50.0000 mg | ORAL_CAPSULE | Freq: Once | ORAL | Status: AC
  Administered 2014-02-24: 50 mg via ORAL

## 2014-02-24 MED ORDER — SODIUM CHLORIDE 0.9 % IJ SOLN
10.0000 mL | INTRAMUSCULAR | Status: DC | PRN
  Administered 2014-02-24: 10 mL
  Filled 2014-02-24: qty 10

## 2014-02-24 MED ORDER — DEXAMETHASONE SODIUM PHOSPHATE 20 MG/5ML IJ SOLN
20.0000 mg | Freq: Once | INTRAMUSCULAR | Status: AC
  Administered 2014-02-24: 20 mg via INTRAVENOUS

## 2014-02-24 NOTE — Progress Notes (Signed)
OFFICE PROGRESS NOTE  CC  Madeline Amass, MD [address] [place] [address] Dr. Rolm Davidson Dr. Thea Davidson  DIAGNOSIS: 58 year old female with new diagnosis of right breast cancer in the lower outer quadrant diagnosed December 2014.  STAGE:  Breast cancer  Primary site: Breast (Right)  Staging method: AJCC 7th Edition  Clinical: Stage IIB (T2, N1, cM0) signed by Madeline Robinson, MD on 01/10/2014 5:01 PM  Summary: Stage IIB (T2, N1, cM0)   PRIOR THERAPY: #1 Patient on 12/16/2013 felt a lump in her right breast. She states it was nickel size. Because of this she 1 on to see her nurse practitioner. On 12/31/2013 a mammogram was ordered. This showed a lesion in the 7:00 position measuring 3.3 cm. She was also noted to have enlarged lymph nodes. Subsequent ultrasound showed a 2.2 x 2.4 cm mass with enlarged lymph node measuring 1.6 cm. This was biopsied in [place]. 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 Lymph node was positive for metastatic disease. Prognostic markers revealed the tumor to be estrogen receptor +90% progesterone receptor 1% HER-2/neu positive  #2 staging PET/CT performed on 10/19/2535: Hypermetabolic mass posterior right breast with SUV max 13.6 and  measuring 13.2 cm (image 108). There two 12 in 16 mm hypermetabolic  nodules in the posterior right breast versus lymph nodes with  intense metabolic activity SUV max 10.9. No hypermetabolic  supraclavicular internal mammary nodes. No hypermetabolic  mediastinal lymph nodes. No suspicious pulmonary nodules.  ABDOMEN/PELVIS  There are 3 hypermetabolic periportal lymph nodes. The largest  measures 20 mm with SUV max 13.6. Additional small less than 10 mm  short axis nodes on image 144 and 130 also have intense metabolic  activity. No abnormal metabolic activity within the liver. No  hypermetabolic abdominal pelvic lymph nodes.  #3 patient has been seen by Dr. Rolm Davidson and Dr. Thea Davidson in consultation.  CURRENT THERAPY: Neoadjuvant Cycle 2 day 1 of TCH/perjeta 02/24/2014  INTERVAL HISTORY: Madeline Davidson 58 y.o. female returns for followup visit prior to cycle 2 of TCH/P Overall she's doing well.  She tolerated the first of her chemotherapy well.  She denies any headaches double vision blurring of vision no fevers chills or night sweats no abdominal pain.  Her diarrhea is resolved. She does have flushing of the face. She thinks this is just due to the anxiety. No evidence of infections. She has no peripheral paresthesias no diarrhea or constipation. Remainder of the 10 point review of systems is negative.   MEDICAL HISTORY: Past Medical History  Diagnosis Date  . 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 Hypertension   . Allergy   . Hypertension 01/10/2014  . Wears glasses     ALLERGIES:  is allergic to ciprofloxacin.  MEDICATIONS:  Current Outpatient Prescriptions  Medication Sig Dispense Refill  . cetirizine (ZYRTEC) 10 MG tablet Take 10 mg by mouth daily.      . clonazePAM (KLONOPIN) 0.5 MG tablet Take 0.5 mg by mouth as needed for anxiety (take half a tablet as needed).      Marland Kitchen dexamethasone (DECADRON) 4 MG tablet Take 2 tablets (8 mg total) by mouth 2 (two) times daily with a meal. Take two times a day the day before Taxotere. Then take two times a day starting the day after chemo for 3 days.  30 tablet  1  . ibuprofen (ADVIL,MOTRIN) 800 MG tablet Take  800 mg by mouth as needed.      . lidocaine-prilocaine (EMLA) cream Apply topically as needed.  30 g  6  . lisinopril-hydrochlorothiazide (PRINZIDE,ZESTORETIC) 10-12.5 MG per tablet Take 1 tablet by mouth daily.      Marland Kitchen LORazepam (ATIVAN) 0.5 MG tablet Take 1 tablet (0.5 mg total) by mouth every 6 (six) hours as needed (Nausea or vomiting).  30 tablet  0  . ondansetron (ZOFRAN) 8 MG tablet Take 1 tablet (8 mg total) by mouth 2 (two)  times daily. Take two times a day starting the day after chemo for 3 days. Then take two times a day as needed for nausea or vomiting.  30 tablet  1  . prochlorperazine (COMPAZINE) 10 MG tablet Take 1 tablet (10 mg total) by mouth every 6 (six) hours as needed (Nausea or vomiting).  30 tablet  1  . zolpidem (AMBIEN) 10 MG tablet Take 10 mg by mouth at bedtime as needed for sleep (take half a tablet at night).       No current facility-administered medications for this visit.   Facility-Administered Medications Ordered in Other Visits  Medication Dose Route Frequency Provider Last Rate Last Dose  . sodium chloride 0.9 % injection 10 mL  10 mL Intracatheter PRN Madeline Robinson, MD   10 mL at 02/03/14 1759    SURGICAL HISTORY:  Past Surgical History  Procedure Laterality Date  . Colon resection  2004  . Hernia repair  2005    umb  . Cholecystectomy  2005  . Portacath placement Left 01/31/2014    Procedure: INSERTION PORT-A-CATH;  Surgeon: Madeline Bookbinder, MD;  Location: [facility] OR;  Service: General;  Laterality: Left;    REVIEW OF SYSTEMS:  Pertinent items are noted in HPI.     PHYSICAL EXAMINATION: Blood pressure 107/66, pulse 89, temperature 97.3 F (36.3 C), temperature source Oral, resp. rate 18, height _0  (1.626 m), weight 203 lb 12.8 oz (92.443 kg). Body mass index is 34.96 kg/(m^2). ECOG PERFORMANCE STATUS: 0 - Asymptomatic   General appearance: alert, cooperative and appears stated age Neck: no adenopathy, no carotid bruit, no JVD, supple, symmetrical, trachea midline and thyroid not enlarged, symmetric, no tenderness/mass/nodules Lymph nodes: Cervical, supraclavicular, and axillary nodes normal. Resp: clear to auscultation bilaterally Back: symmetric, no curvature. ROM normal. No CVA tenderness. Cardio: regular rate and rhythm GI: soft, non-tender; bowel sounds normal; no masses,  no organomegaly Extremities: extremities normal, atraumatic, no cyanosis or edema Neurologic:  Grossly normal Breasts: left breast normal without mass, skin or nipple changes or axillary nodes, abnormal mass palpable right breast.   LABORATORY DATA: Lab Results  Component Value Date   WBC 9.0 02/24/2014   HGB 11.4* 02/24/2014   HCT 33.5* 02/24/2014   MCV 85.8 02/24/2014   PLT 272 02/24/2014      Chemistry      Component Value Date/Time   NA 144 02/24/2014 0909   NA 142 01/31/2014 1143   K 3.9 02/24/2014 0909   K 4.4 01/31/2014 1143   CL 104 01/31/2014 1143   CO2 25 02/24/2014 0909   CO2 24 01/31/2014 1143   BUN 18.9 02/24/2014 0909   BUN 18 01/31/2014 1143   CREATININE 0.8 02/24/2014 0909   CREATININE 0.72 01/31/2014 1143      Component Value Date/Time   CALCIUM 9.9 02/24/2014 0909   CALCIUM 9.2 01/31/2014 1143   ALKPHOS 88 02/24/2014 0909   AST 13 02/24/2014 0909   ALT 20 02/24/2014 0909  BILITOT 0.21 02/24/2014 0909     Diagnosis 1. Consult Slide , Right Breast Biopsy - INVASIVE DUCTAL CARCINOMA. - SEE COMMENT. 2. Consult Slide , Right lymph node - METASTATIC CARCINOMA IN 1 OF 1 LYMPH NODE (1/1). Microscopic Comment 1. The carcinoma is at least grade II. Per outside report, the tumor is positive for estrogen receptor (90% of tumor cells show strong nuclear staining) and positive for progesterone receptor (1% of tumor cells show weak nuclear staining). The tumor cells are positive for Her 2 amplification by immunohistochemistry. (JBK:kh 01-13-14) Enid Cutter MD Pathologist, Electronic Signature (Case signed 01/13/2014) Specimen Gross and Clinical Information Specimen(s) Obtained: 1. Consult Slide , Right Breast Biopsy 2. Consult Slide , Right lymph node Specimen Clinical Information 1. (tl) 2. (tl) Gross 1. Received from [facility]:  Ct Chest W Contrast  01/20/2014   CLINICAL DATA:  Initial staging of breast cancer. Node positive right breast cancer.  EXAM: CT CHEST, ABDOMEN, AND PELVIS WITH CONTRAST  TECHNIQUE: Multidetector CT imaging of the chest, abdomen  and pelvis was performed following the standard protocol during bolus administration of intravenous contrast.  CONTRAST:  127m OMNIPAQUE IOHEXOL 300 MG/ML  SOLN  COMPARISON:  NM PET IMAGE INITIAL (PI) SKULL BASE TO THIGH dated 01/20/2014  FINDINGS: CT CHEST FINDINGS  There is a mass within the inferior lateral right breast measuring 2.5 cm. More superiorly there 2 adjacent masses versus lymph nodes in the posterior aspect of the right breast measuring 19 and 18 mm. Small sub pectoral lymph node measures 5 mm.  No infraclavicular or internal mammary lymphadenopathy. No mediastinal or hilar lymphadenopathy. No central pulmonary embolism. No pericardial fluid.  View of the lung parenchyma demonstrates no pulmonary nodules.  CT ABDOMEN AND PELVIS FINDINGS  No focal hepatic lesion. Pneumobilia within the left hepatic lobe suggesting prior sphincterotomy. There is enlarged periportal lymph node measuring 21 mm (image 66, series 2). Smaller lymph node measuring 8 mm (image 62) is not pathologic by size criteria. The pancreas, spleen, adrenal glands, and kidneys are normal.  The stomach, small bowel, and colon demonstrate no evidence of obstruction or mass. There are at least 3 ventral hernias. The medial hernia sacs contains the the terminal ileum and cecum. The lateral hernia sac contains a long segment of small bowel. The more superior hernia sac contains a short segment of transverse colon  Abdominal aorta is normal caliber. No retroperitoneal periportal lymphadenopathy. . No free fluid the pelvis. The uterus, ovaries, and bladder normal. No pelvic lymphadenopathy. No aggressive osseous lesion.  IMPRESSION: 1. Mass in the posterior lateral right breast consists with primary carcinoma. 2. Two  additional right breast lesions versus nodal metastasis. 3. No evidence of central nodal metastasis or mediastinal nodal metastasis. 4. Single enlarged periportal lymph node. Of note this lymph node was hypermetabolic on comparison  PET-CT scan. 5. Multiple ventral hernias contain nonobstructed segments of small and large bowel.   Electronically Signed   By: SSuzy BouchardM.D.   On: 01/20/2014 15:37   Ct Abdomen Pelvis W Contrast  01/20/2014   CLINICAL DATA:  Initial staging of breast cancer. Node positive right breast cancer.  EXAM: CT CHEST, ABDOMEN, AND PELVIS WITH CONTRAST  TECHNIQUE: Multidetector CT imaging of the chest, abdomen and pelvis was performed following the standard protocol during bolus administration of intravenous contrast.  CONTRAST:  1026mOMNIPAQUE IOHEXOL 300 MG/ML  SOLN  COMPARISON:  NM PET IMAGE INITIAL (PI) SKULL BASE TO THIGH dated 01/20/2014  FINDINGS:  CT CHEST FINDINGS  There is a mass within the inferior lateral right breast measuring 2.5 cm. More superiorly there 2 adjacent masses versus lymph nodes in the posterior aspect of the right breast measuring 19 and 18 mm. Small sub pectoral lymph node measures 5 mm.  No infraclavicular or internal mammary lymphadenopathy. No mediastinal or hilar lymphadenopathy. No central pulmonary embolism. No pericardial fluid.  View of the lung parenchyma demonstrates no pulmonary nodules.  CT ABDOMEN AND PELVIS FINDINGS  No focal hepatic lesion. Pneumobilia within the left hepatic lobe suggesting prior sphincterotomy. There is enlarged periportal lymph node measuring 21 mm (image 66, series 2). Smaller lymph node measuring 8 mm (image 62) is not pathologic by size criteria. The pancreas, spleen, adrenal glands, and kidneys are normal.  The stomach, small bowel, and colon demonstrate no evidence of obstruction or mass. There are at least 3 ventral hernias. The medial hernia sacs contains the the terminal ileum and cecum. The lateral hernia sac contains a long segment of small bowel. The more superior hernia sac contains a short segment of transverse colon  Abdominal aorta is normal caliber. No retroperitoneal periportal lymphadenopathy. . No free fluid the pelvis. The uterus,  ovaries, and bladder normal. No pelvic lymphadenopathy. No aggressive osseous lesion.  IMPRESSION: 1. Mass in the posterior lateral right breast consists with primary carcinoma. 2. Two  additional right breast lesions versus nodal metastasis. 3. No evidence of central nodal metastasis or mediastinal nodal metastasis. 4. Single enlarged periportal lymph node. Of note this lymph node was hypermetabolic on comparison PET-CT scan. 5. Multiple ventral hernias contain nonobstructed segments of small and large bowel.   Electronically Signed   By: Suzy Bouchard M.D.   On: 01/20/2014 15:37   Nm Pet Image Initial (pi) Skull Base To Thigh  01/20/2014   CLINICAL DATA:  Initial treatment strategy for breast carcinoma.  EXAM: NUCLEAR MEDICINE PET SKULL BASE TO THIGH  FASTING BLOOD GLUCOSE:  Value: 44m/dl  TECHNIQUE: 17.6 mCi F-18 FDG was injected intravenously. CT data was obtained and used for attenuation correction and anatomic localization only. (This was not acquired as a diagnostic CT examination.) Additional exam technical data entered on technologist worksheet.  COMPARISON:  None.  FINDINGS: NECK  No hypermetabolic lymph nodes in the neck.  CHEST  Hypermetabolic mass posterior right breast with SUV max 13.6 and measuring 13.2 cm (image 108). There two 12 in 16 mm hypermetabolic nodules in the posterior right breast versus lymph nodes with intense metabolic activity SUV max 10.9. No hypermetabolic supraclavicular internal mammary nodes. No hypermetabolic mediastinal lymph nodes. No suspicious pulmonary nodules.  ABDOMEN/PELVIS  There are 3 hypermetabolic periportal lymph nodes. The largest measures 20 mm with SUV max 13.6. Additional small less than 10 mm short axis nodes on image 144 and 130 also have intense metabolic activity. No abnormal metabolic activity within the liver. No hypermetabolic abdominal pelvic lymph nodes.  SKELETON  No focal hypermetabolic activity to suggest skeletal metastasis.  IMPRESSION: 1.  Hypermetabolic mass in the posterior right breast consists with primary breast carcinoma. 2. Two hypermetabolic nodules in the deep right breast either represents additional foci of breast cancers or metastatic lymph nodes. 3. No evidence of central hypermetabolic nodes or mediastinal nodes. 4. Hypermetabolic metastatic lymph nodes within the porta hepatis.   Electronically Signed   By: SSuzy BouchardM.D.   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 Patient has had staging studies performed that revealed 2 additional areas of concern in the right breast as well as some periportal lymphadenopathy. Patient had staging studies performed including a PET scan that did show hypermetabolic mass in the posterior right breast consistent with breast primary. There were also noted to be 2 hypermetabolic nodules in the deep right breast either representing additional foci of breast cancer or metastatic lymph nodes. There was no evidence of central hypermetabolic nodes or mediastinal nodes. There were noted to be a hypermetabolic metastatic lymph nodes within the porta hepatis. MRI of the breasts performed on 02/01/2014 revealed nuclear mass in the lateral right breast history 1 measurement 3.8 cm on the biopsy-proven invasive carcinoma. 2 intramammary lymph nodes in the upper outer right breast with abnormal morphology. One of which was biopsy-proven to represent metastatic carcinoma. There is indeterminate 0.7 cm level I right axillary lymph node procedure to [place] muscle and anterior to Trellis minor muscle. Indeterminate 0.8 cm lower mediastinal right paracardiac lymph node. These nodes were not hypermetabolic on recent PET scan. There was no evidence of malignancy in the left breast.  #2  proceed with cycle 2 day 1 of TCH/Perjeta  today. 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  #3 hypertension: This is under reasonable control.  #3 anti-emetic usage: She does have Zofran Compazine dexamethasone as well as Ativan at her disposal.  #4 dehydration/dizziness: We will give patient IV fluids today 500 cc with her chemotherapy.  #5 Follow up: patient will be seen back in 1 week for followup and labs and chemotherapy toxicity check  All questions were answered. The patient knows to call the clinic with any problems, questions or concerns. We can certainly see the patient much sooner if necessary.  I spent 20 minutes counseling the patient face to face. The total time spent in the appointment was 30 minutes.    Marcy Panning, MD Medical/Oncology [facility] [phone] (beeper) [phone] (Office)  02/24/2014, 10:16 AM

## 2014-02-24 NOTE — Progress Notes (Signed)
Carboplatin dose had to run over 45 minutes due to large volume and pump will not allow override programming.

## 2014-02-24 NOTE — Patient Instructions (Signed)
[facility] Discharge Instructions for Patients Receiving Chemotherapy  Today you received the following chemotherapy agents :  Herceptin, Perjeta, Taxotere, Carboplatin.  To help prevent nausea and vomiting after your treatment, we encourage you to take your nausea medication as instructed by your physician.   If you develop nausea and vomiting that is not controlled by your nausea medication, call the clinic.   BELOW ARE SYMPTOMS THAT SHOULD BE REPORTED IMMEDIATELY:  *FEVER GREATER THAN 100.5 F  *CHILLS WITH OR WITHOUT FEVER  NAUSEA AND VOMITING THAT IS NOT CONTROLLED WITH YOUR NAUSEA MEDICATION  *UNUSUAL SHORTNESS OF BREATH  *UNUSUAL BRUISING OR BLEEDING  TENDERNESS IN MOUTH AND THROAT WITH OR WITHOUT PRESENCE OF ULCERS  *URINARY PROBLEMS  *BOWEL PROBLEMS  UNUSUAL RASH Items with * indicate a potential emergency and should be followed up as soon as possible.  Feel free to call the clinic you have any questions or concerns. The clinic phone number is [phone].

## 2014-02-25 ENCOUNTER — Encounter: Payer: Self-pay | Admitting: Oncology

## 2014-02-25 ENCOUNTER — Ambulatory Visit (HOSPITAL_BASED_OUTPATIENT_CLINIC_OR_DEPARTMENT_OTHER): Payer: BC Managed Care – PPO

## 2014-02-25 VITALS — BP 132/64 | HR 79 | Temp 98.5°F

## 2014-02-25 DIAGNOSIS — Z5189 Encounter for other specified aftercare: Secondary | ICD-10-CM

## 2014-02-25 DIAGNOSIS — C50919 Malignant neoplasm of unspecified site of unspecified female breast: Secondary | ICD-10-CM

## 2014-02-25 DIAGNOSIS — C50519 Malignant neoplasm of lower-outer quadrant of unspecified female breast: Secondary | ICD-10-CM

## 2014-02-25 MED ORDER — PEGFILGRASTIM INJECTION 6 MG/0.6ML
6.0000 mg | Freq: Once | SUBCUTANEOUS | Status: AC
Start: 2014-02-25 — End: 2014-02-25
  Administered 2014-02-25: 6 mg via SUBCUTANEOUS
  Filled 2014-02-25: qty 0.6

## 2014-03-03 ENCOUNTER — Ambulatory Visit (HOSPITAL_BASED_OUTPATIENT_CLINIC_OR_DEPARTMENT_OTHER): Payer: BC Managed Care – PPO | Admitting: Oncology

## 2014-03-03 ENCOUNTER — Other Ambulatory Visit (HOSPITAL_BASED_OUTPATIENT_CLINIC_OR_DEPARTMENT_OTHER): Payer: BC Managed Care – PPO

## 2014-03-03 ENCOUNTER — Encounter: Payer: Self-pay | Admitting: Oncology

## 2014-03-03 VITALS — BP 104/67 | HR 104 | Temp 98.1°F | Resp 18 | Ht 64.0 in | Wt 195.8 lb

## 2014-03-03 DIAGNOSIS — C50919 Malignant neoplasm of unspecified site of unspecified female breast: Secondary | ICD-10-CM

## 2014-03-03 DIAGNOSIS — C773 Secondary and unspecified malignant neoplasm of axilla and upper limb lymph nodes: Secondary | ICD-10-CM

## 2014-03-03 DIAGNOSIS — C50519 Malignant neoplasm of lower-outer quadrant of unspecified female breast: Secondary | ICD-10-CM

## 2014-03-03 DIAGNOSIS — I10 Essential (primary) hypertension: Secondary | ICD-10-CM

## 2014-03-03 DIAGNOSIS — Z170 Estrogen receptor positive status [ER+]: Secondary | ICD-10-CM

## 2014-03-03 LAB — COMPREHENSIVE METABOLIC PANEL (CC13)
ALBUMIN: 3.7 g/dL (ref 3.5–5.0)
ALT: 35 U/L (ref 0–55)
AST: 16 U/L (ref 5–34)
Alkaline Phosphatase: 101 U/L (ref 40–150)
Anion Gap: 12 mEq/L — ABNORMAL HIGH (ref 3–11)
BILIRUBIN TOTAL: 0.43 mg/dL (ref 0.20–1.20)
BUN: 20.7 mg/dL (ref 7.0–26.0)
CO2: 24 meq/L (ref 22–29)
Calcium: 10.1 mg/dL (ref 8.4–10.4)
Chloride: 101 mEq/L (ref 98–109)
Creatinine: 0.9 mg/dL (ref 0.6–1.1)
GLUCOSE: 130 mg/dL (ref 70–140)
Potassium: 4.2 mEq/L (ref 3.5–5.1)
SODIUM: 138 meq/L (ref 136–145)
TOTAL PROTEIN: 6.8 g/dL (ref 6.4–8.3)

## 2014-03-03 LAB — CBC WITH DIFFERENTIAL/PLATELET
BASO%: 0.3 % (ref 0.0–2.0)
Basophils Absolute: 0 10*3/uL (ref 0.0–0.1)
EOS%: 0 % (ref 0.0–7.0)
Eosinophils Absolute: 0 10*3/uL (ref 0.0–0.5)
HCT: 39.1 % (ref 34.8–46.6)
HGB: 13.4 g/dL (ref 11.6–15.9)
LYMPH%: 22.3 % (ref 14.0–49.7)
MCH: 28.9 pg (ref 25.1–34.0)
MCHC: 34.3 g/dL (ref 31.5–36.0)
MCV: 84.4 fL (ref 79.5–101.0)
MONO#: 1.9 10*3/uL — ABNORMAL HIGH (ref 0.1–0.9)
MONO%: 28.2 % — ABNORMAL HIGH (ref 0.0–14.0)
NEUT%: 49.2 % (ref 38.4–76.8)
NEUTROS ABS: 3.2 10*3/uL (ref 1.5–6.5)
PLATELETS: 296 10*3/uL (ref 145–400)
RBC: 4.63 10*6/uL (ref 3.70–5.45)
RDW: 14.6 % — ABNORMAL HIGH (ref 11.2–14.5)
WBC: 6.6 10*3/uL (ref 3.9–10.3)
lymph#: 1.5 10*3/uL (ref 0.9–3.3)
nRBC: 0 % (ref 0–0)

## 2014-03-03 NOTE — Patient Instructions (Signed)
Doing well  Push fluids and continue taking Immodium  I will see you back in 2 weeks for cycle 3

## 2014-03-03 NOTE — Progress Notes (Signed)
OFFICE PROGRESS NOTE  CC  Madeline Amass, MD [address] [place] [address] Dr. Rolm Bookbinder Dr. Thea Silversmith  DIAGNOSIS: 58 year old female with new diagnosis of right breast cancer in the lower outer quadrant diagnosed December 2014.  STAGE:  Breast cancer  Primary site: Breast (Right)  Staging method: AJCC 7th Edition  Clinical: Stage IIB (T2, N1, cM0) signed by Madeline Robinson, MD on 01/10/2014 5:01 PM  Summary: Stage IIB (T2, N1, cM0)   PRIOR THERAPY: #1 Patient on 12/16/2013 felt a lump in her right breast. She states it was nickel size. Because of this she 1 on to see her nurse practitioner. On 12/31/2013 a mammogram was ordered. This showed a lesion in the 7:00 position measuring 3.3 cm. She was also noted to have enlarged lymph nodes. Subsequent ultrasound showed a 2.2 x 2.4 cm mass with enlarged lymph node measuring 1.6 cm. This was biopsied in [place]. 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 Lymph node was positive for metastatic disease. Prognostic markers revealed the tumor to be estrogen receptor +90% progesterone receptor 1% HER-2/neu positive  #2 staging PET/CT performed on 93/23/5573: Hypermetabolic mass posterior right breast with SUV max 13.6 and  measuring 13.2 cm (image 108). There two 12 in 16 mm hypermetabolic  nodules in the posterior right breast versus lymph nodes with  intense metabolic activity SUV max 10.9. No hypermetabolic  supraclavicular internal mammary nodes. No hypermetabolic  mediastinal lymph nodes. No suspicious pulmonary nodules.  ABDOMEN/PELVIS  There are 3 hypermetabolic periportal lymph nodes. The largest  measures 20 mm with SUV max 13.6. Additional small less than 10 mm  short axis nodes on image 144 and 130 also have intense metabolic  activity. No abnormal metabolic activity within the liver. No  hypermetabolic abdominal pelvic lymph nodes.  #3 patient has been seen by Dr. Rolm Bookbinder and Dr. Thea Silversmith in consultation.  CURRENT THERAPY: Neoadjuvant Cycle 2 day 8 of TCH/perjeta 02/24/2014  INTERVAL HISTORY: Madeline Davidson 58 y.o. female returns for followup visit after  cycle 2 of TCH/P Overall she's doing well.  She tolerated the  her chemotherapy well.  She denies any headaches double vision blurring of vision no fevers chills or night sweats no abdominal pain.  Her diarrhea is resolved. She does have flushing of the face. She thinks this is just due to the anxiety. No evidence of infections. She has no peripheral paresthesias no diarrhea or constipation. Remainder of the 10 point review of systems is negative.   MEDICAL HISTORY: Past Medical History  Diagnosis Date  . 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 Hypertension   . Allergy   . Hypertension 01/10/2014  . Wears glasses     ALLERGIES:  is allergic to ciprofloxacin.  MEDICATIONS:  Current Outpatient Prescriptions  Medication Sig Dispense Refill  . cetirizine (ZYRTEC) 10 MG tablet Take 10 mg by mouth daily.      . clonazePAM (KLONOPIN) 0.5 MG tablet Take 0.5 mg by mouth as needed for anxiety (take half a tablet as needed).      Marland Kitchen dexamethasone (DECADRON) 4 MG tablet Take 2 tablets (8 mg total) by mouth 2 (two) times daily with a meal. Take two times a day the day before Taxotere. Then take two times a day starting the day after chemo for 3 days.  30 tablet  1  . ibuprofen (ADVIL,MOTRIN) 800 MG tablet Take 800  mg by mouth as needed.      . lidocaine-prilocaine (EMLA) cream Apply topically as needed.  30 g  6  . lisinopril-hydrochlorothiazide (PRINZIDE,ZESTORETIC) 10-12.5 MG per tablet Take 1 tablet by mouth daily.      Marland Kitchen LORazepam (ATIVAN) 0.5 MG tablet Take 1 tablet (0.5 mg total) by mouth every 6 (six) hours as needed (Nausea or vomiting).  30 tablet  0  . ondansetron (ZOFRAN) 8 MG tablet Take 1 tablet (8 mg total) by mouth 2 (two) times  daily. Take two times a day starting the day after chemo for 3 days. Then take two times a day as needed for nausea or vomiting.  30 tablet  1  . prochlorperazine (COMPAZINE) 10 MG tablet Take 1 tablet (10 mg total) by mouth every 6 (six) hours as needed (Nausea or vomiting).  30 tablet  1  . zolpidem (AMBIEN) 10 MG tablet Take 10 mg by mouth at bedtime as needed for sleep (take half a tablet at night).       No current facility-administered medications for this visit.   Facility-Administered Medications Ordered in Other Visits  Medication Dose Route Frequency Provider Last Rate Last Dose  . sodium chloride 0.9 % injection 10 mL  10 mL Intracatheter PRN Madeline Robinson, MD   10 mL at 02/03/14 1759    SURGICAL HISTORY:  Past Surgical History  Procedure Laterality Date  . Colon resection  2004  . Hernia repair  2005    umb  . Cholecystectomy  2005  . Portacath placement Left 01/31/2014    Procedure: INSERTION PORT-A-CATH;  Surgeon: Rolm Bookbinder, MD;  Location: [facility] OR;  Service: General;  Laterality: Left;    REVIEW OF SYSTEMS:  Pertinent items are noted in HPI.     PHYSICAL EXAMINATION: Blood pressure 104/67, pulse 104, temperature 98.1 F (36.7 C), temperature source Oral, resp. rate 18, height $RemoveBe'5\' 4"'UgtYHnUKG$  (1.626 m), weight 195 lb 12.8 oz (88.814 kg). Body mass index is 33.59 kg/(m^2). ECOG PERFORMANCE STATUS: 0 - Asymptomatic   General appearance: alert, cooperative and appears stated age Neck: no adenopathy, no carotid bruit, no JVD, supple, symmetrical, trachea midline and thyroid not enlarged, symmetric, no tenderness/mass/nodules Lymph nodes: Cervical, supraclavicular, and axillary nodes normal. Resp: clear to auscultation bilaterally Back: symmetric, no curvature. ROM normal. No CVA tenderness. Cardio: regular rate and rhythm GI: soft, non-tender; bowel sounds normal; no masses,  no organomegaly Extremities: extremities normal, atraumatic, no cyanosis or edema Neurologic:  Grossly normal Breasts: left breast normal without mass, skin or nipple changes or axillary nodes, abnormal mass palpable right breast.   LABORATORY DATA: Lab Results  Component Value Date   WBC 6.6 03/03/2014   HGB 13.4 03/03/2014   HCT 39.1 03/03/2014   MCV 84.4 03/03/2014   PLT 296 03/03/2014      Chemistry      Component Value Date/Time   NA 138 03/03/2014 1053   NA 142 01/31/2014 1143   K 4.2 03/03/2014 1053   K 4.4 01/31/2014 1143   CL 104 01/31/2014 1143   CO2 24 03/03/2014 1053   CO2 24 01/31/2014 1143   BUN 20.7 03/03/2014 1053   BUN 18 01/31/2014 1143   CREATININE 0.9 03/03/2014 1053   CREATININE 0.72 01/31/2014 1143      Component Value Date/Time   CALCIUM 10.1 03/03/2014 1053   CALCIUM 9.2 01/31/2014 1143   ALKPHOS 101 03/03/2014 1053   AST 16 03/03/2014 1053   ALT 35 03/03/2014 1053  BILITOT 0.43 03/03/2014 1053     Diagnosis 1. Consult Slide , Right Breast Biopsy - INVASIVE DUCTAL CARCINOMA. - SEE COMMENT. 2. Consult Slide , Right lymph node - METASTATIC CARCINOMA IN 1 OF 1 LYMPH NODE (1/1). Microscopic Comment 1. The carcinoma is at least grade II. Per outside report, the tumor is positive for estrogen receptor (90% of tumor cells show strong nuclear staining) and positive for progesterone receptor (1% of tumor cells show weak nuclear staining). The tumor cells are positive for Her 2 amplification by immunohistochemistry. (JBK:kh 01-13-14) Pecola Leisure MD Pathologist, Electronic Signature (Case signed 01/13/2014) Specimen Gross and Clinical Information Specimen(s) Obtained: 1. Consult Slide , Right Breast Biopsy 2. Consult Slide , Right lymph node Specimen Clinical Information 1. (tl) 2. (tl) Gross 1. Received from [facility]  RADIOGRAPHIC STUDIES:  Ct Chest W Contrast  01/20/2014   CLINICAL DATA:  Initial staging of breast cancer. Node positive right breast cancer.  EXAM: CT CHEST, ABDOMEN, AND PELVIS WITH CONTRAST  TECHNIQUE: Multidetector CT imaging of the  chest, abdomen and pelvis was performed following the standard protocol during bolus administration of intravenous contrast.  CONTRAST:  OMNIPAQUE IOHEXOL 300 MG/ML  SOLN  COMPARISON:  NM PET IMAGE INITIAL (PI) SKULL BASE TO THIGH dated 01/20/2014  FINDINGS: CT CHEST FINDINGS  There is a mass within the inferior lateral right breast measuring 2.5 cm. More superiorly there 2 adjacent masses versus lymph nodes in the posterior aspect of the right breast measuring 19 and 18 mm. Small sub pectoral lymph node measures 5 mm.  No infraclavicular or internal mammary lymphadenopathy. No mediastinal or hilar lymphadenopathy. No central pulmonary embolism. No pericardial fluid.  View of the lung parenchyma demonstrates no pulmonary nodules.  CT ABDOMEN AND PELVIS FINDINGS  No focal hepatic lesion. Pneumobilia within the left hepatic lobe suggesting prior sphincterotomy. There is enlarged periportal lymph node measuring 21 mm (image 66, series 2). Smaller lymph node measuring 8 mm (image 62) is not pathologic by size criteria. The pancreas, spleen, adrenal glands, and kidneys are normal.  The stomach, small bowel, and colon demonstrate no evidence of obstruction or mass. There are at least 3 ventral hernias. The medial hernia sacs contains the the terminal ileum and cecum. The lateral hernia sac contains a long segment of small bowel. The more superior hernia sac contains a short segment of transverse colon  Abdominal aorta is normal caliber. No retroperitoneal periportal lymphadenopathy. . No free fluid the pelvis. The uterus, ovaries, and bladder normal. No pelvic lymphadenopathy. No aggressive osseous lesion.  IMPRESSION: 1. Mass in the posterior lateral right breast consists with primary carcinoma. 2. Two  additional right breast lesions versus nodal metastasis. 3. No evidence of central nodal metastasis or mediastinal nodal metastasis. 4. Single enlarged periportal lymph node. Of note this lymph node was  hypermetabolic on comparison PET-CT scan. 5. Multiple ventral hernias contain nonobstructed segments of small and large bowel.   Electronically Signed   By: Genevive Bi M.D.   On: 01/20/2014 15:37   Ct Abdomen Pelvis W Contrast  01/20/2014   CLINICAL DATA:  Initial staging of breast cancer. Node positive right breast cancer.  EXAM: CT CHEST, ABDOMEN, AND PELVIS WITH CONTRAST  TECHNIQUE: Multidetector CT imaging of the chest, abdomen and pelvis was performed following the standard protocol during bolus administration of intravenous contrast.  CONTRAST:  OMNIPAQUE IOHEXOL 300 MG/ML  SOLN  COMPARISON:  NM PET IMAGE INITIAL (PI) SKULL BASE TO THIGH dated 01/20/2014  FINDINGS:  CT CHEST FINDINGS  There is a mass within the inferior lateral right breast measuring 2.5 cm. More superiorly there 2 adjacent masses versus lymph nodes in the posterior aspect of the right breast measuring 19 and 18 mm. Small sub pectoral lymph node measures 5 mm.  No infraclavicular or internal mammary lymphadenopathy. No mediastinal or hilar lymphadenopathy. No central pulmonary embolism. No pericardial fluid.  View of the lung parenchyma demonstrates no pulmonary nodules.  CT ABDOMEN AND PELVIS FINDINGS  No focal hepatic lesion. Pneumobilia within the left hepatic lobe suggesting prior sphincterotomy. There is enlarged periportal lymph node measuring 21 mm (image 66, series 2). Smaller lymph node measuring 8 mm (image 62) is not pathologic by size criteria. The pancreas, spleen, adrenal glands, and kidneys are normal.  The stomach, small bowel, and colon demonstrate no evidence of obstruction or mass. There are at least 3 ventral hernias. The medial hernia sacs contains the the terminal ileum and cecum. The lateral hernia sac contains a long segment of small bowel. The more superior hernia sac contains a short segment of transverse colon  Abdominal aorta is normal caliber. No retroperitoneal periportal lymphadenopathy. . No free  fluid the pelvis. The uterus, ovaries, and bladder normal. No pelvic lymphadenopathy. No aggressive osseous lesion.  IMPRESSION: 1. Mass in the posterior lateral right breast consists with primary carcinoma. 2. Two  additional right breast lesions versus nodal metastasis. 3. No evidence of central nodal metastasis or mediastinal nodal metastasis. 4. Single enlarged periportal lymph node. Of note this lymph node was hypermetabolic on comparison PET-CT scan. 5. Multiple ventral hernias contain nonobstructed segments of small and large bowel.   Electronically Signed   By: Suzy Bouchard M.D.   On: 01/20/2014 15:37   Nm Pet Image Initial (pi) Skull Base To Thigh  01/20/2014   CLINICAL DATA:  Initial treatment strategy for breast carcinoma.  EXAM: NUCLEAR MEDICINE PET SKULL BASE TO THIGH  FASTING BLOOD GLUCOSE:  Value: $Remov'99mg'uZOagC$ /dl  TECHNIQUE: 17.6 mCi F-18 FDG was injected intravenously. CT data was obtained and used for attenuation correction and anatomic localization only. (This was not acquired as a diagnostic CT examination.) Additional exam technical data entered on technologist worksheet.  COMPARISON:  None.  FINDINGS: NECK  No hypermetabolic lymph nodes in the neck.  CHEST  Hypermetabolic mass posterior right breast with SUV max 13.6 and measuring 13.2 cm (image 108). There two 12 in 16 mm hypermetabolic nodules in the posterior right breast versus lymph nodes with intense metabolic activity SUV max 10.9. No hypermetabolic supraclavicular internal mammary nodes. No hypermetabolic mediastinal lymph nodes. No suspicious pulmonary nodules.  ABDOMEN/PELVIS  There are 3 hypermetabolic periportal lymph nodes. The largest measures 20 mm with SUV max 13.6. Additional small less than 10 mm short axis nodes on image 144 and 130 also have intense metabolic activity. No abnormal metabolic activity within the liver. No hypermetabolic abdominal pelvic lymph nodes.  SKELETON  No focal hypermetabolic activity to suggest skeletal  metastasis.  IMPRESSION: 1. Hypermetabolic mass in the posterior right breast consists with primary breast carcinoma. 2. Two hypermetabolic nodules in the deep right breast either represents additional foci of breast cancers or metastatic lymph nodes. 3. No evidence of central hypermetabolic nodes or mediastinal nodes. 4. Hypermetabolic metastatic lymph nodes within the porta hepatis.   Electronically Signed   By: Suzy Bouchard M.D.   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 Patient has had staging studies performed that revealed 2 additional areas of concern in the right breast as well as some periportal lymphadenopathy. Patient had staging studies performed including a PET scan that did show hypermetabolic mass in the posterior right breast consistent with breast primary. There were also noted to be 2 hypermetabolic nodules in the deep right breast either representing additional foci of breast cancer or metastatic lymph nodes. There was no evidence of central hypermetabolic nodes or mediastinal nodes. There were noted to be a hypermetabolic metastatic lymph nodes within the porta hepatis. MRI of the breasts performed on 02/01/2014 revealed nuclear mass in the lateral right breast history 1 measurement 3.8 cm on the biopsy-proven invasive carcinoma. 2 intramammary lymph nodes in the upper outer right breast with abnormal morphology. One of which was biopsy-proven to represent metastatic carcinoma. There is indeterminate 0.7 cm level I right axillary lymph node procedure to [place] muscle and anterior to Trellis minor muscle. Indeterminate 0.8 cm lower mediastinal right paracardiac lymph node. These nodes were not hypermetabolic on recent PET scan. There was no evidence of malignancy in the left breast.  #2  S/P cycle 2 day  8 of TCH/Perjeta today. 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  #3 hypertension: This is under reasonable control.  #3 anti-emetic usage: She does have Zofran Compazine dexamethasone as well as Ativan at her disposal.  #4 dehydration : resolved, patient does not need or want any fluids today  #5 Follow up: patient will be seen back in 2 weeks for followup and labs and chemotherapy with TCH/P cycle 3  All questions were answered. The patient knows to call the clinic with any problems, questions or concerns. We can certainly see the patient much sooner if necessary.  I spent 20 minutes counseling the patient face to face. The total time spent in the appointment was 30 minutes.    Marcy Panning, MD Medical/Oncology [facility] [phone] (beeper) [phone] (Office)  03/03/2014, 12:01 PM

## 2014-03-16 ENCOUNTER — Telehealth: Payer: Self-pay | Admitting: *Deleted

## 2014-03-16 NOTE — Telephone Encounter (Signed)
Pt called requesting guidance for neulasta injection insurance payment.  Sent pt name and information to our drug replacement specialist.  Informed pt to still come in and receive treatment and injection on Friday.

## 2014-03-17 ENCOUNTER — Encounter: Payer: Self-pay | Admitting: Oncology

## 2014-03-17 ENCOUNTER — Telehealth: Payer: Self-pay | Admitting: *Deleted

## 2014-03-17 ENCOUNTER — Ambulatory Visit (HOSPITAL_BASED_OUTPATIENT_CLINIC_OR_DEPARTMENT_OTHER): Payer: BC Managed Care – PPO | Admitting: Oncology

## 2014-03-17 ENCOUNTER — Other Ambulatory Visit (HOSPITAL_BASED_OUTPATIENT_CLINIC_OR_DEPARTMENT_OTHER): Payer: BC Managed Care – PPO

## 2014-03-17 ENCOUNTER — Ambulatory Visit (HOSPITAL_BASED_OUTPATIENT_CLINIC_OR_DEPARTMENT_OTHER): Payer: BC Managed Care – PPO

## 2014-03-17 ENCOUNTER — Telehealth: Payer: Self-pay | Admitting: Oncology

## 2014-03-17 VITALS — BP 131/71 | HR 94 | Temp 97.1°F | Resp 18 | Ht 64.0 in | Wt 203.2 lb

## 2014-03-17 DIAGNOSIS — C50519 Malignant neoplasm of lower-outer quadrant of unspecified female breast: Secondary | ICD-10-CM

## 2014-03-17 DIAGNOSIS — Z5112 Encounter for antineoplastic immunotherapy: Secondary | ICD-10-CM

## 2014-03-17 DIAGNOSIS — Z170 Estrogen receptor positive status [ER+]: Secondary | ICD-10-CM

## 2014-03-17 DIAGNOSIS — C50919 Malignant neoplasm of unspecified site of unspecified female breast: Secondary | ICD-10-CM

## 2014-03-17 DIAGNOSIS — Z5111 Encounter for antineoplastic chemotherapy: Secondary | ICD-10-CM

## 2014-03-17 LAB — COMPREHENSIVE METABOLIC PANEL (CC13)
ALBUMIN: 3.6 g/dL (ref 3.5–5.0)
ALT: 21 U/L (ref 0–55)
AST: 12 U/L (ref 5–34)
Alkaline Phosphatase: 100 U/L (ref 40–150)
Anion Gap: 16 mEq/L — ABNORMAL HIGH (ref 3–11)
BUN: 14.5 mg/dL (ref 7.0–26.0)
CALCIUM: 9.8 mg/dL (ref 8.4–10.4)
CHLORIDE: 105 meq/L (ref 98–109)
CO2: 24 meq/L (ref 22–29)
Creatinine: 0.8 mg/dL (ref 0.6–1.1)
Glucose: 207 mg/dl — ABNORMAL HIGH (ref 70–140)
Potassium: 3.2 mEq/L — ABNORMAL LOW (ref 3.5–5.1)
Sodium: 145 mEq/L (ref 136–145)
Total Bilirubin: 0.32 mg/dL (ref 0.20–1.20)
Total Protein: 7.1 g/dL (ref 6.4–8.3)

## 2014-03-17 LAB — CBC WITH DIFFERENTIAL/PLATELET
BASO%: 0.1 % (ref 0.0–2.0)
Basophils Absolute: 0 10*3/uL (ref 0.0–0.1)
EOS%: 0 % (ref 0.0–7.0)
Eosinophils Absolute: 0 10*3/uL (ref 0.0–0.5)
HEMATOCRIT: 33.1 % — AB (ref 34.8–46.6)
HGB: 10.9 g/dL — ABNORMAL LOW (ref 11.6–15.9)
LYMPH#: 1 10*3/uL (ref 0.9–3.3)
LYMPH%: 6.2 % — ABNORMAL LOW (ref 14.0–49.7)
MCH: 29.1 pg (ref 25.1–34.0)
MCHC: 33.1 g/dL (ref 31.5–36.0)
MCV: 88.2 fL (ref 79.5–101.0)
MONO#: 0.4 10*3/uL (ref 0.1–0.9)
MONO%: 2.4 % (ref 0.0–14.0)
NEUT%: 91.3 % — ABNORMAL HIGH (ref 38.4–76.8)
NEUTROS ABS: 15.2 10*3/uL — AB (ref 1.5–6.5)
Platelets: 196 10*3/uL (ref 145–400)
RBC: 3.76 10*6/uL (ref 3.70–5.45)
RDW: 17.1 % — ABNORMAL HIGH (ref 11.2–14.5)
WBC: 16.7 10*3/uL — AB (ref 3.9–10.3)

## 2014-03-17 MED ORDER — ONDANSETRON 16 MG/50ML IVPB (CHCC)
16.0000 mg | Freq: Once | INTRAVENOUS | Status: AC
  Administered 2014-03-17: 16 mg via INTRAVENOUS

## 2014-03-17 MED ORDER — SODIUM CHLORIDE 0.9 % IJ SOLN
10.0000 mL | INTRAMUSCULAR | Status: DC | PRN
  Administered 2014-03-17: 10 mL
  Filled 2014-03-17: qty 10

## 2014-03-17 MED ORDER — DIPHENHYDRAMINE HCL 25 MG PO CAPS
50.0000 mg | ORAL_CAPSULE | Freq: Once | ORAL | Status: AC
  Administered 2014-03-17: 50 mg via ORAL

## 2014-03-17 MED ORDER — ONDANSETRON 16 MG/50ML IVPB (CHCC)
INTRAVENOUS | Status: AC
  Filled 2014-03-17: qty 16

## 2014-03-17 MED ORDER — SODIUM CHLORIDE 0.9 % IV SOLN
420.0000 mg | Freq: Once | INTRAVENOUS | Status: AC
  Administered 2014-03-17: 420 mg via INTRAVENOUS
  Filled 2014-03-17: qty 14

## 2014-03-17 MED ORDER — ACETAMINOPHEN 325 MG PO TABS
650.0000 mg | ORAL_TABLET | Freq: Once | ORAL | Status: AC
  Administered 2014-03-17: 650 mg via ORAL

## 2014-03-17 MED ORDER — DIPHENHYDRAMINE HCL 25 MG PO CAPS
ORAL_CAPSULE | ORAL | Status: AC
  Filled 2014-03-17: qty 2

## 2014-03-17 MED ORDER — SODIUM CHLORIDE 0.9 % IV SOLN
75.0000 mg/m2 | Freq: Once | INTRAVENOUS | Status: AC
  Administered 2014-03-17: 150 mg via INTRAVENOUS
  Filled 2014-03-17: qty 15

## 2014-03-17 MED ORDER — DEXAMETHASONE SODIUM PHOSPHATE 20 MG/5ML IJ SOLN
INTRAMUSCULAR | Status: AC
  Filled 2014-03-17: qty 5

## 2014-03-17 MED ORDER — ACETAMINOPHEN 325 MG PO TABS
ORAL_TABLET | ORAL | Status: AC
  Filled 2014-03-17: qty 2

## 2014-03-17 MED ORDER — TRASTUZUMAB CHEMO INJECTION 440 MG
6.0000 mg/kg | Freq: Once | INTRAVENOUS | Status: AC
  Administered 2014-03-17: 567 mg via INTRAVENOUS
  Filled 2014-03-17: qty 27

## 2014-03-17 MED ORDER — SODIUM CHLORIDE 0.9 % IV SOLN
Freq: Once | INTRAVENOUS | Status: AC
  Administered 2014-03-17: 09:00:00 via INTRAVENOUS

## 2014-03-17 MED ORDER — DEXAMETHASONE SODIUM PHOSPHATE 20 MG/5ML IJ SOLN
20.0000 mg | Freq: Once | INTRAMUSCULAR | Status: AC
  Administered 2014-03-17: 20 mg via INTRAVENOUS

## 2014-03-17 MED ORDER — HEPARIN SOD (PORK) LOCK FLUSH 100 UNIT/ML IV SOLN
500.0000 [IU] | Freq: Once | INTRAVENOUS | Status: AC | PRN
  Administered 2014-03-17: 500 [IU]
  Filled 2014-03-17: qty 5

## 2014-03-17 MED ORDER — SODIUM CHLORIDE 0.9 % IV SOLN
765.6000 mg | Freq: Once | INTRAVENOUS | Status: AC
  Administered 2014-03-17: 770 mg via INTRAVENOUS
  Filled 2014-03-17: qty 77

## 2014-03-17 NOTE — Progress Notes (Signed)
Enrolled pt in the Neulasta First Step program.  I faxed signed form and activated card today.  °

## 2014-03-17 NOTE — Progress Notes (Signed)
Pt reports no appt for chemo today.  Contacted Infusion - Amy Horton - added to schedule at 930.  Pt informed - voiced understanding.  Future appts verified.

## 2014-03-17 NOTE — Progress Notes (Signed)
[facility] OFFICE PROGRESS NOTE  Patient Care Team: [name] as PCP - General (Nurse Practitioner) Dr. Rolm Bookbinder  Dr. Thea Silversmith  DIAGNOSIS: 58 year old female with new diagnosis of right breast cancer in the lower outer quadrant diagnosed December 2014.  STAGE:  Breast cancer  Primary site: Breast (Right)  Staging method: AJCC 7th Edition  Clinical: Stage IIB (T2, N1, cM0) signed by [name], MD on 01/10/2014 5:01 PM  Summary: Stage IIB (T2, N1, cM0)  SUMMARY OF ONCOLOGIC HISTORY: #1 Patient on 12/16/2013 felt a lump in her right breast. She states it was nickel size. Because of this she 1 on to see her nurse practitioner. On 12/31/2013 a mammogram was ordered. This showed a lesion in the 7:00 position measuring 3.3 cm. She was also noted to have enlarged lymph nodes. Subsequent ultrasound showed a 2.2 x 2.4 cm mass with enlarged lymph node measuring 1.6 cm. This was biopsied in [place]. 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 Lymph node was positive for metastatic disease. Prognostic markers revealed the tumor to be estrogen receptor +90% progesterone receptor 1% HER-2/neu positive   #2 staging PET/CT performed on 18/29/9371:  Hypermetabolic mass posterior right breast with SUV max 13.6 and  measuring 13.2 cm (image 108). There two 12 in 16 mm hypermetabolic  nodules in the posterior right breast versus lymph nodes with  intense metabolic activity SUV max 10.9. No hypermetabolic  supraclavicular internal mammary nodes. No hypermetabolic  mediastinal lymph nodes. No suspicious pulmonary nodules.  ABDOMEN/PELVIS  There are 3 hypermetabolic periportal lymph nodes. The largest  measures 20 mm with SUV max 13.6. Additional small less than 10 mm  short axis nodes on image 144 and 130 also have intense metabolic  activity. No abnormal metabolic activity within the liver. No  hypermetabolic abdominal pelvic lymph nodes.     CURRENT THERAPY: Neoadjuvant Cycle 3 day 1 of TCH/perjeta 03/17/2014   INTERVAL HISTORY: Madeline Davidson 58 y.o. female returns for followup visit today. Clinically she looks terrific. She is due to receive cycle 3 of her chemotherapy today. I have reviewed her blood work and they look fine. She certainly can proceed with her chemotherapy as scheduled. She does return tomorrow for Neulasta injection. In the past she has developed diarrhea and dehydration. I have encouraged her to push fluids and to take Imodium as needed. Of course she can call us with any problems. She is denying any rashes peripheral paresthesias no nausea or vomiting no diarrhea today. She does have abdominal hernia which is looks a little bit more prominent to me but she states that it is not hurting her in any way. She has no hematuria hematochezia melena hemoptysis or hematemesis. Remainder of the 10 point review of systems is negative as below.  I have reviewed the past medical history, past surgical history, social history and family history with the patient and they are unchanged from previous note.  ALLERGIES:  is allergic to ciprofloxacin.  MEDICATIONS:  Current Outpatient Prescriptions  Medication Sig Dispense Refill  . cetirizine (ZYRTEC) 10 MG tablet Take 10 mg by mouth daily.      . clonazePAM (KLONOPIN) 0.5 MG tablet Take 0.5 mg by mouth as needed for anxiety (take half a tablet as needed).      Marland Kitchen dexamethasone (DECADRON) 4 MG tablet Take 2 tablets (8 mg total) by mouth 2 (two) times daily with a meal. Take two times a day the day before Taxotere. Then take two  times a day starting the day after chemo for 3 days.  30 tablet  1  . ibuprofen (ADVIL,MOTRIN) 800 MG tablet Take 800 mg by mouth as needed.      . lidocaine-prilocaine (EMLA) cream Apply topically as needed.  30 g  6  . lisinopril-hydrochlorothiazide (PRINZIDE,ZESTORETIC) 10-12.5 MG per tablet Take 1 tablet by mouth daily.      Marland Kitchen LORazepam (ATIVAN)  0.5 MG tablet Take 1 tablet (0.5 mg total) by mouth every 6 (six) hours as needed (Nausea or vomiting).  30 tablet  0  . ondansetron (ZOFRAN) 8 MG tablet Take 1 tablet (8 mg total) by mouth 2 (two) times daily. Take two times a day starting the day after chemo for 3 days. Then take two times a day as needed for nausea or vomiting.  30 tablet  1  . prochlorperazine (COMPAZINE) 10 MG tablet Take 1 tablet (10 mg total) by mouth every 6 (six) hours as needed (Nausea or vomiting).  30 tablet  1  . zolpidem (AMBIEN) 10 MG tablet Take 10 mg by mouth at bedtime as needed for sleep (take half a tablet at night).       No current facility-administered medications for this visit.   Facility-Administered Medications Ordered in Other Visits  Medication Dose Route Frequency Provider Last Rate Last Dose  . sodium chloride 0.9 % injection 10 mL  10 mL Intracatheter PRN [name], MD   10 mL at 02/03/14 1759    REVIEW OF SYSTEMS:   Constitutional: Denies fevers, chills or abnormal weight loss Eyes: Denies blurriness of vision Ears, nose, mouth, throat, and face: Denies mucositis or sore throat Respiratory: Denies cough, dyspnea or wheezes Cardiovascular: Denies palpitation, chest discomfort or lower extremity swelling Gastrointestinal:  Denies nausea, heartburn or change in bowel habits Skin: Denies abnormal skin rashes Lymphatics: Denies new lymphadenopathy or easy bruising Neurological:Denies numbness, tingling or new weaknesses Behavioral/Psych: Mood is stable, no new changes  All other systems were reviewed with the patient and are negative.  PHYSICAL EXAMINATION: ECOG PERFORMANCE STATUS: 1 - Symptomatic but completely ambulatory  Filed Vitals:   03/17/14 0830  BP: 131/71  Pulse: 94  Temp: 97.1 F (36.2 C)  Resp: 18   Filed Weights   03/17/14 0830  Weight: 203 lb 3.2 oz (92.171 kg)    GENERAL:alert, no distress and comfortable SKIN: skin color, texture, turgor are normal, no rashes  or significant lesions EYES: normal, Conjunctiva are pink and non-injected, sclera clear OROPHARYNX:no exudate, no erythema and lips, buccal mucosa, and tongue normal  NECK: supple, thyroid normal size, non-tender, without nodularity LYMPH:  no palpable lymphadenopathy in the cervical, axillary or inguinal LUNGS: clear to auscultation and percussion with normal breathing effort HEART: regular rate & rhythm and no murmurs and no lower extremity edema ABDOMEN:abdomen soft, non-tender and normal bowel sounds Musculoskeletal:no cyanosis of digits and no clubbing  NEURO: alert & oriented x 3 with fluent speech, no focal motor/sensory deficits Bilateral breast examination: Right breast barely palpable mass in the lower outer quadrant. No nipple discharge no skin changes left breast no masses or nipple discharge. LABORATORY DATA:  I have reviewed the data as listed    Component Value Date/Time   NA 138 03/03/2014 1053   NA 142 01/31/2014 1143   K 4.2 03/03/2014 1053   K 4.4 01/31/2014 1143   CL 104 01/31/2014 1143   CO2 24 03/03/2014 1053   CO2 24 01/31/2014 1143   GLUCOSE 130 03/03/2014 1053  GLUCOSE 91 01/31/2014 1143   BUN 20.7 03/03/2014 1053   BUN 18 01/31/2014 1143   CREATININE 0.9 03/03/2014 1053   CREATININE 0.72 01/31/2014 1143   CALCIUM 10.1 03/03/2014 1053   CALCIUM 9.2 01/31/2014 1143   PROT 6.8 03/03/2014 1053   ALBUMIN 3.7 03/03/2014 1053   AST 16 03/03/2014 1053   ALT 35 03/03/2014 1053   ALKPHOS 101 03/03/2014 1053   BILITOT 0.43 03/03/2014 1053   GFRNONAA >90 01/31/2014 1143   GFRAA >90 01/31/2014 1143    No results found for this basename: SPEP, UPEP,  kappa and lambda light chains    Lab Results  Component Value Date   WBC 16.7* 03/17/2014   NEUTROABS 15.2* 03/17/2014   HGB 10.9* 03/17/2014   HCT 33.1* 03/17/2014   MCV 88.2 03/17/2014   PLT 196 03/17/2014      Chemistry      Component Value Date/Time   NA 138 03/03/2014 1053   NA 142 01/31/2014 1143   K 4.2 03/03/2014 1053   K 4.4  01/31/2014 1143   CL 104 01/31/2014 1143   CO2 24 03/03/2014 1053   CO2 24 01/31/2014 1143   BUN 20.7 03/03/2014 1053   BUN 18 01/31/2014 1143   CREATININE 0.9 03/03/2014 1053   CREATININE 0.72 01/31/2014 1143      Component Value Date/Time   CALCIUM 10.1 03/03/2014 1053   CALCIUM 9.2 01/31/2014 1143   ALKPHOS 101 03/03/2014 1053   AST 16 03/03/2014 1053   ALT 35 03/03/2014 1053   BILITOT 0.43 03/03/2014 1053       RADIOGRAPHIC STUDIES: I have personally reviewed the radiological images as listed and agreed with the findings in the report. No results found.    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 Patient has had staging studies performed that revealed 2 additional areas of concern in the right breast as well as some periportal lymphadenopathy. Patient had staging studies performed including a PET scan that did show hypermetabolic mass in the posterior right breast consistent with breast primary. There were also noted to be 2 hypermetabolic nodules in the deep right breast either representing additional foci of breast cancer or metastatic lymph nodes. There was no evidence of central hypermetabolic nodes or mediastinal nodes. There were noted to be a hypermetabolic metastatic lymph nodes within the porta hepatis. MRI of the breasts performed on 02/01/2014 revealed nuclear mass in the lateral right breast history 1 measurement 3.8 cm on the biopsy-proven invasive carcinoma. 2 intramammary lymph nodes in the upper outer right breast with abnormal morphology. One of which was biopsy-proven to represent metastatic carcinoma. There is indeterminate 0.7 cm level I right axillary lymph node procedure to [place] muscle and anterior to Trellis minor muscle. Indeterminate 0.8 cm lower mediastinal right paracardiac lymph node. These nodes were not  hypermetabolic on recent PET scan. There was no evidence of malignancy in the left breast.  #2 she was begun on neoadjuvant chemotherapy consisting of TCH/P. she is here for cycle 3 day 1 of her chemotherapy. I have checked her blood count. Her white count looks terrific in fact is a little bit elevated but that may just be due to the Neulasta injection she receives. She is slightly anemic but asymptomatic. She will proceed with her chemotherapy today.  #3 dehydration: This is resolved. She however is  at risk for that she is getting chemotherapy today. I have recommended that she pushed fluids water Gatorade anything that she can take down. She is also encouraged to take her anti-emetics so that she does not developed nausea or vomiting. She is also encouraged to take Imodium should she starts having diarrhea.  #4 FEN: Electrolytes look terrific.  #5 followup: She will be seen back in one week in followup with blood work. In the meantime she knows to call us with any problems questions or concerns.  No orders of the defined types were placed in this encounter.   All questions were answered. The patient knows to call the clinic with any problems, questions or concerns. No barriers to learning was detected. I spent 20 minutes counseling the patient face to face. The total time spent in the appointment was 30 minutes and more than 50% was on counseling and review of test results and corrdination of care.     Marcy Panning, MD 03/17/2014 8:56 AM

## 2014-03-17 NOTE — Patient Instructions (Signed)
Proceed with chemotherapy today  We will see you back in the office on 03/24/14  Please call with any problems, questions or concerns

## 2014-03-17 NOTE — Telephone Encounter (Signed)
Per staff phone call and POF I have schedueld appts.  JMW  

## 2014-03-17 NOTE — Patient Instructions (Signed)
[facility] Discharge Instructions for Patients Receiving Chemotherapy  Today you received the following chemotherapy agents Herceptin, Perjeta, Taxotere, Carbo.   To help prevent nausea and vomiting after your treatment, we encourage you to take your nausea medication as prescribed.    If you develop nausea and vomiting that is not controlled by your nausea medication, call the clinic.   BELOW ARE SYMPTOMS THAT SHOULD BE REPORTED IMMEDIATELY:  *FEVER GREATER THAN 100.5 F  *CHILLS WITH OR WITHOUT FEVER  NAUSEA AND VOMITING THAT IS NOT CONTROLLED WITH YOUR NAUSEA MEDICATION  *UNUSUAL SHORTNESS OF BREATH  *UNUSUAL BRUISING OR BLEEDING  TENDERNESS IN MOUTH AND THROAT WITH OR WITHOUT PRESENCE OF ULCERS  *URINARY PROBLEMS  *BOWEL PROBLEMS  UNUSUAL RASH Items with * indicate a potential emergency and should be followed up as soon as possible.  Feel free to call the clinic should you have any questions or concerns. The clinic phone number is [phone].  It was my pleasure to take care of you today! Leeanne Rio, RN

## 2014-03-17 NOTE — Telephone Encounter (Signed)
, °

## 2014-03-18 ENCOUNTER — Ambulatory Visit (HOSPITAL_BASED_OUTPATIENT_CLINIC_OR_DEPARTMENT_OTHER): Payer: BC Managed Care – PPO

## 2014-03-18 VITALS — BP 132/59 | HR 95 | Temp 98.0°F

## 2014-03-18 DIAGNOSIS — C50919 Malignant neoplasm of unspecified site of unspecified female breast: Secondary | ICD-10-CM

## 2014-03-18 DIAGNOSIS — C50519 Malignant neoplasm of lower-outer quadrant of unspecified female breast: Secondary | ICD-10-CM

## 2014-03-18 DIAGNOSIS — Z5189 Encounter for other specified aftercare: Secondary | ICD-10-CM

## 2014-03-18 MED ORDER — PEGFILGRASTIM INJECTION 6 MG/0.6ML
6.0000 mg | Freq: Once | SUBCUTANEOUS | Status: AC
  Administered 2014-03-18: 6 mg via SUBCUTANEOUS
  Filled 2014-03-18: qty 0.6

## 2014-03-24 ENCOUNTER — Encounter: Payer: Self-pay | Admitting: Adult Health

## 2014-03-24 ENCOUNTER — Ambulatory Visit (HOSPITAL_BASED_OUTPATIENT_CLINIC_OR_DEPARTMENT_OTHER): Payer: BC Managed Care – PPO | Admitting: Adult Health

## 2014-03-24 ENCOUNTER — Other Ambulatory Visit (HOSPITAL_BASED_OUTPATIENT_CLINIC_OR_DEPARTMENT_OTHER): Payer: BC Managed Care – PPO

## 2014-03-24 VITALS — BP 121/78 | HR 103 | Temp 98.7°F | Resp 18 | Ht 64.0 in | Wt 197.3 lb

## 2014-03-24 DIAGNOSIS — G5790 Unspecified mononeuropathy of unspecified lower limb: Secondary | ICD-10-CM

## 2014-03-24 DIAGNOSIS — C50919 Malignant neoplasm of unspecified site of unspecified female breast: Secondary | ICD-10-CM

## 2014-03-24 DIAGNOSIS — G4700 Insomnia, unspecified: Secondary | ICD-10-CM

## 2014-03-24 DIAGNOSIS — C773 Secondary and unspecified malignant neoplasm of axilla and upper limb lymph nodes: Secondary | ICD-10-CM

## 2014-03-24 DIAGNOSIS — C50419 Malignant neoplasm of upper-outer quadrant of unspecified female breast: Secondary | ICD-10-CM

## 2014-03-24 DIAGNOSIS — Z170 Estrogen receptor positive status [ER+]: Secondary | ICD-10-CM

## 2014-03-24 DIAGNOSIS — R110 Nausea: Secondary | ICD-10-CM

## 2014-03-24 LAB — CBC WITH DIFFERENTIAL/PLATELET
BASO%: 0.2 % (ref 0.0–2.0)
Basophils Absolute: 0 10*3/uL (ref 0.0–0.1)
EOS%: 0 % (ref 0.0–7.0)
Eosinophils Absolute: 0 10*3/uL (ref 0.0–0.5)
HEMATOCRIT: 33.7 % — AB (ref 34.8–46.6)
HEMOGLOBIN: 11.2 g/dL — AB (ref 11.6–15.9)
LYMPH#: 1.3 10*3/uL (ref 0.9–3.3)
LYMPH%: 28.3 % (ref 14.0–49.7)
MCH: 29.2 pg (ref 25.1–34.0)
MCHC: 33.2 g/dL (ref 31.5–36.0)
MCV: 88 fL (ref 79.5–101.0)
MONO#: 1.2 10*3/uL — AB (ref 0.1–0.9)
MONO%: 26.7 % — ABNORMAL HIGH (ref 0.0–14.0)
NEUT%: 44.8 % (ref 38.4–76.8)
NEUTROS ABS: 2 10*3/uL (ref 1.5–6.5)
Platelets: 245 10*3/uL (ref 145–400)
RBC: 3.83 10*6/uL (ref 3.70–5.45)
RDW: 16.9 % — ABNORMAL HIGH (ref 11.2–14.5)
WBC: 4.5 10*3/uL (ref 3.9–10.3)
nRBC: 0 % (ref 0–0)

## 2014-03-24 LAB — COMPREHENSIVE METABOLIC PANEL (CC13)
ALT: 29 U/L (ref 0–55)
AST: 15 U/L (ref 5–34)
Albumin: 3.6 g/dL (ref 3.5–5.0)
Alkaline Phosphatase: 98 U/L (ref 40–150)
Anion Gap: 15 mEq/L — ABNORMAL HIGH (ref 3–11)
BUN: 15.5 mg/dL (ref 7.0–26.0)
CALCIUM: 9.6 mg/dL (ref 8.4–10.4)
CHLORIDE: 99 meq/L (ref 98–109)
CO2: 25 mEq/L (ref 22–29)
Creatinine: 0.8 mg/dL (ref 0.6–1.1)
Glucose: 103 mg/dl (ref 70–140)
Potassium: 3.6 mEq/L (ref 3.5–5.1)
Sodium: 139 mEq/L (ref 136–145)
Total Bilirubin: 0.73 mg/dL (ref 0.20–1.20)
Total Protein: 6.5 g/dL (ref 6.4–8.3)

## 2014-03-24 MED ORDER — ZOLPIDEM TARTRATE ER 12.5 MG PO TBCR: 12.5000 mg | EXTENDED_RELEASE_TABLET | Freq: Every evening | ORAL | Status: DC | PRN

## 2014-03-24 NOTE — Patient Instructions (Signed)
Insomnia Insomnia is frequent trouble falling and/or staying asleep. Insomnia can be a long term problem or a short term problem. Both are common. Insomnia can be a short term problem when the wakefulness is related to a certain stress or worry. Long term insomnia is often related to ongoing stress during waking hours and/or poor sleeping habits. Overtime, sleep deprivation itself can make the problem worse. Every little thing feels more severe because you are overtired and your ability to cope is decreased. CAUSES   Stress, anxiety, and depression.  Poor sleeping habits.  Distractions such as TV in the bedroom.  Naps close to bedtime.  Engaging in emotionally charged conversations before bed.  Technical reading before sleep.  Alcohol and other sedatives. They may make the problem worse. They can hurt normal sleep patterns and normal dream activity.  Stimulants such as caffeine for several hours prior to bedtime.  Pain syndromes and shortness of breath can cause insomnia.  Exercise late at night.  Changing time zones may cause sleeping problems (jet lag). It is sometimes helpful to have someone observe your sleeping patterns. They should look for periods of not breathing during the night (sleep apnea). They should also look to see how long those periods last. If you live alone or observers are uncertain, you can also be observed at a sleep clinic where your sleep patterns will be professionally monitored. Sleep apnea requires a checkup and treatment. Give your caregivers your medical history. Give your caregivers observations your family has made about your sleep.  SYMPTOMS   Not feeling rested in the morning.  Anxiety and restlessness at bedtime.  Difficulty falling and staying asleep. TREATMENT   Your caregiver may prescribe treatment for an underlying medical disorders. Your caregiver can give advice or help if you are using alcohol or other drugs for self-medication. Treatment  of underlying problems will usually eliminate insomnia problems.  Medications can be prescribed for short time use. They are generally not recommended for lengthy use.  Over-the-counter sleep medicines are not recommended for lengthy use. They can be habit forming.  You can promote easier sleeping by making lifestyle changes such as:  Using relaxation techniques that help with breathing and reduce muscle tension.  Exercising earlier in the day.  Changing your diet and the time of your last meal. No night time snacks.  Establish a regular time to go to bed.  Counseling can help with stressful problems and worry.  Soothing music and white noise may be helpful if there are background noises you cannot remove.  Stop tedious detailed work at least one hour before bedtime. HOME CARE INSTRUCTIONS   Keep a diary. Inform your caregiver about your progress. This includes any medication side effects. See your caregiver regularly. Take note of:  Times when you are asleep.  Times when you are awake during the night.  The quality of your sleep.  How you feel the next day. This information will help your caregiver care for you.  Get out of bed if you are still awake after 15 minutes. Read or do some quiet activity. Keep the lights down. Wait until you feel sleepy and go back to bed.  Keep regular sleeping and waking hours. Avoid naps.  Exercise regularly.  Avoid distractions at bedtime. Distractions include watching television or engaging in any intense or detailed activity like attempting to balance the household checkbook.  Develop a bedtime ritual. Keep a familiar routine of bathing, brushing your teeth, climbing into bed at the same   time each night, listening to soothing music. Routines increase the success of falling to sleep faster.  Use relaxation techniques. This can be using breathing and muscle tension release routines. It can also include visualizing peaceful scenes. You can  also help control troubling or intruding thoughts by keeping your mind occupied with boring or repetitive thoughts like the old concept of counting sheep. You can make it more creative like imagining planting one beautiful flower after another in your backyard garden.  During your day, work to eliminate stress. When this is not possible use some of the previous suggestions to help reduce the anxiety that accompanies stressful situations. MAKE SURE YOU:   Understand these instructions.  Will watch your condition.  Will get help right away if you are not doing well or get worse. Document Released: 12/06/2000 Document Revised: 03/02/2012 Document Reviewed: 01/06/2008 [employer] Patient Information 2014 [place]. Zolpidem extended-release tablets What is this medicine? ZOLPIDEM (zole PI dem) is used to treat insomnia. This medicine helps you to fall asleep and sleep through the night. This medicine may be used for other purposes; ask your health care provider or pharmacist if you have questions. COMMON BRAND NAME(S): Ambien CR What should I tell my health care provider before I take this medicine? They need to know if you have any of these conditions: -depression -history of a drug or alcohol abuse problem -liver disease -lung or breathing disease -suicidal thoughts -an unusual or allergic reaction to zolpidem, other medicines, foods, dyes, or preservatives -pregnant or trying to get pregnant -breast-feeding How should I use this medicine? Take this medicine by mouth with a glass of water. Follow the directions on the prescription label. Do not crush, split, or chew the tablet before swallowing. It is better to take this medicine on an empty stomach and only when you are ready for bed. Do not take your medicine more often than directed. If you have been taking this medicine for several weeks and suddenly stop taking it, you may get unpleasant withdrawal symptoms. Your doctor or health  care professional may want to gradually reduce the dose. Do not stop taking this medicine on your own. Always follow your doctor or health care professional's advice. A special MedGuide will be given to you by the pharmacist with each prescription and refill. Be sure to read this information carefully each time. Talk to your pediatrician regarding the use of this medicine in children. Special care may be needed. Overdosage: If you think you have taken too much of this medicine contact a poison control center or emergency room at once. NOTE: This medicine is only for you. Do not share this medicine with others. What if I miss a dose? This does not apply. This medicine should only be taken immediately before going to sleep. Do not take double or extra doses. What may interact with this medicine? -herbal medicines like kava kava, melatonin, St. John's wort and valerian -medicines for fungal infections like ketoconazole, fluconazole, or itraconazole -medicines for treating depression or other mental problems -other medicines given for sleep -some medicines for Parkinson' s disease or other movement disorders -some medicines used to treat HIV infection or AIDS, like ritonavir This list may not describe all possible interactions. Give your health care provider a list of all the medicines, herbs, non-prescription drugs, or dietary supplements you use. Also tell them if you smoke, drink alcohol, or use illegal drugs. Some items may interact with your medicine. What should I watch for while using this  medicine? Visit your doctor or health care professional for regular checks on your progress. Keep a regular sleep schedule by going to bed at about the same time each night. Avoid caffeine-containing drinks in the evening hours. When sleep medicines are used every night for more than a few weeks, they may stop working. Talk to your doctor if your insomnia worsens or is not better within 7 to 10 days. You may  not be able to remember things that you do in the hours after you take this medicine. Some people have reported driving, making phone calls, or preparing and eating food while asleep after taking sleep medicine. Take this medicine right before going to sleep. Tell your doctor if you are having any problems with your memory. After you stop taking this medicine, you may have trouble falling asleep. This is called rebound insomnia. This problem usually goes away on its own after 1 or 2 nights. Do not take this medicine unless you are able to stay in bed for a full night (7 to 8 hours) before you must be active again. Do not drive, use machinery, or do anything that needs mental alertness the day after you take this medicine. You may have a decrease in mental alertness the day after use, even if you feel that you are fully awake. Tell your doctor if you will need to perform activities requiring full alertness, such as driving, the next day. You may get drowsy or dizzy. Do not stand or sit up quickly, especially if you are an older patient. This reduces the risk of dizzy or fainting spells. Alcohol may interfere with the effect of this medicine. Avoid alcoholic drinks. If you or your family notice any changes in your behavior, or if you have any unusual or disturbing thoughts, call your doctor right away. What side effects may I notice from receiving this medicine? Side effects that you should report to your doctor or health care professional as soon as possible: -allergic reactions like skin rash, itching or hives, swelling of the face, lips, or tongue -changes in vision -confusion -depressed mood -feeling faint or lightheaded, falls -hallucinations -problems with balance, speaking, walking -restlessness, excitability, or feelings of agitation -unusual activities while asleep like driving, eating, making phone calls Side effects that usually do not require medical attention (report to your doctor or  health care professional if they continue or are bothersome): -diarrhea -dizziness, or daytime drowsiness, sometimes called a hangover effect -headache This list may not describe all possible side effects. Call your doctor for medical advice about side effects. You may report side effects to FDA at 1-800-FDA-1088. Where should I keep my medicine? Keep out of the reach of children. This medicine can be abused. Keep your medicine in a safe place to protect it from theft. Do not share this medicine with anyone. Selling or giving away this medicine is dangerous and against the law. Store at controlled room temperature between 15 and 25 degrees C (59 and 77 degrees F). Throw away any unused medicine after the expiration date. NOTE: This sheet is a summary. It may not cover all possible information. If you have questions about this medicine, talk to your doctor, pharmacist, or health care provider.  2014, [employer] Standard. (2012-05-07 12:44:09)

## 2014-03-24 NOTE — Progress Notes (Signed)
Hematology and Oncology Follow Up Visit  [name] [id number] 03-20-1956 58 y.o. 03/25/2014 4:02 PM     Principle Diagnosis:Madeline Davidson 58 y.o. 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     Prior Therapy:  #1 Patient on 12/16/2013 felt a lump in her right breast. She states it was nickel size. Because of this she 1 on to see her nurse practitioner. On 12/31/2013 a mammogram was ordered. This showed a lesion in the 7:00 position measuring 3.3 cm. She was also noted to have enlarged lymph nodes. Subsequent ultrasound showed a 2.2 x 2.4 cm mass with enlarged lymph node measuring 1.6 cm. This was biopsied in [place]. 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 Lymph node was positive for metastatic disease. Prognostic markers revealed the tumor to be estrogen receptor +90% progesterone receptor 1% HER-2/neu positive   #2 staging PET/CT performed on 82/99/3716:  Hypermetabolic mass posterior right breast with SUV max 13.6 and  measuring 13.2 cm (image 108). There two 12 in 16 mm hypermetabolic  nodules in the posterior right breast versus lymph nodes with  intense metabolic activity SUV max 10.9. No hypermetabolic  supraclavicular internal mammary nodes. No hypermetabolic  mediastinal lymph nodes. No suspicious pulmonary nodules.  ABDOMEN/PELVIS  There are 3 hypermetabolic periportal lymph nodes. The largest  measures 20 mm with SUV max 13.6. Additional small less than 10 mm  short axis nodes on image 144 and 130 also have intense metabolic  activity. No abnormal metabolic activity within the liver. No  hypermetabolic abdominal pelvic lymph nodes.    Current therapy:  Neoadjuvant cycle 3 day 8 of Taxotere, Carboplatin, Herceptin, Perjeta  Interim History: Madeline Davidson 58 y.o. 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  She is  currently undergoing neoadjuvant chemotherapy with Taxotere, Carboplatin, Herceptin, Perjeta on day 1 of a 21 day cycle, with Neulasta given for granulocyte support on day 2.  She is cycle 3 day 8 of treatment.   The patient is doing moderately well today.  She is experiencing nausea, it is relieved moderately with her anti-emetics.  She has a decreased appetite and endorses taste changes.  She continues to drink water a day approximately 32-48 ounces per day.  She is also drinking one bottle of gatorade per day.  She has bone aches since receiving the Neulasta.  She is having difficulty taking Tylenol due to the nausea.  She isn't sleeping well, and she takes Ambien.  She goes to bed at 8pm, and wakes up at 1-2 am and has to get up at 3am.  She has mild nubmness in her fingertips and toes.  This is not affecting her activities of daily living.  She had mild diarrhea and it is controlled with Imodium.  She denies fevers, chills, vomiting, constipation, mucositis, skin changes, nail dyscrasia, or any further concerns.    Medications:  Current Outpatient Prescriptions  Medication Sig Dispense Refill  . clonazePAM (KLONOPIN) 0.5 MG tablet Take 0.5 mg by mouth as needed for anxiety (take half a tablet as needed).      Marland Kitchen lidocaine-prilocaine (EMLA) cream Apply topically as needed.  30 g  6  . lisinopril-hydrochlorothiazide (PRINZIDE,ZESTORETIC) 10-12.5 MG per tablet Take 1 tablet by mouth daily.      . prochlorperazine (COMPAZINE) 10 MG tablet Take 1 tablet (10 mg total) by mouth every 6 (six) hours as needed (Nausea or vomiting).  30 tablet  1  . zolpidem (AMBIEN) 10 MG tablet Take 10 mg by mouth at bedtime as needed for sleep (take half a tablet at night).      . cetirizine (ZYRTEC) 10 MG tablet Take 10 mg by mouth daily.      Marland Kitchen dexamethasone (DECADRON) 4 MG tablet Take 2 tablets (8 mg total) by mouth 2 (two) times daily with a meal. Take two times a day the day before Taxotere. Then take two times a day  starting the day after chemo for 3 days.  30 tablet  1  . ibuprofen (ADVIL,MOTRIN) 800 MG tablet Take 800 mg by mouth as needed.      Marland Kitchen LORazepam (ATIVAN) 0.5 MG tablet Take 1 tablet (0.5 mg total) by mouth every 6 (six) hours as needed (Nausea or vomiting).  30 tablet  0  . ondansetron (ZOFRAN) 8 MG tablet Take 1 tablet (8 mg total) by mouth 2 (two) times daily. Take two times a day starting the day after chemo for 3 days. Then take two times a day as needed for nausea or vomiting.  30 tablet  1  . zolpidem (AMBIEN CR) 12.5 MG CR tablet Take 1 tablet (12.5 mg total) by mouth at bedtime as needed for sleep.  30 tablet  0   No current facility-administered medications for this visit.   Facility-Administered Medications Ordered in Other Visits  Medication Dose Route Frequency Provider Last Rate Last Dose  . sodium chloride 0.9 % injection 10 mL  10 mL Intracatheter PRN [name], MD   10 mL at 02/03/14 1759     Allergies:  Allergies  Allergen Reactions  . Ciprofloxacin Other (See Comments)    Patient had a headache after taking Cipro. Made her sinus infection symptoms worse.    Medical History: Past Medical History  Diagnosis Date  . 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 Hypertension   . Allergy   . Hypertension 01/10/2014  . Wears glasses     Surgical History:  Past Surgical History  Procedure Laterality Date  . Colon resection  2004  . Hernia repair  2005    umb  . Cholecystectomy  2005  . Portacath placement Left 01/31/2014    Procedure: INSERTION PORT-A-CATH;  Surgeon: Rolm Bookbinder, MD;  Location: [place] [facility];  Service: General;  Laterality: Left;     Review of Systems: A 10 point review of systems was conducted and is otherwise negative except for what is noted above.     Physical Exam: Blood pressure 121/78, pulse 103, temperature 98.7 F (37.1 C), temperature source Oral, resp. rate 18, height $RemoveBe'5\' 4"'[id number]$  (1.626  m), weight 197 lb 4.8 oz (89.495 kg). GENERAL: Patient is a well appearing female in no acute distress HEENT:  Sclerae anicteric.  Oropharynx clear and moist. No ulcerations or evidence of oropharyngeal candidiasis. Neck is supple.  NODES:  No cervical, supraclavicular, or axillary lymphadenopathy palpated.  BREAST EXAM:  LUNGS:  Clear to auscultation bilaterally.  No wheezes or rhonchi. HEART:  Regular rate and rhythm. No murmur appreciated. ABDOMEN:  Soft, nontender.  Positive, normoactive bowel sounds. No organomegaly palpated. MSK:  No focal spinal tenderness to palpation. Full range of motion bilaterally in the upper extremities. EXTREMITIES:  No peripheral edema.   SKIN:  Clear with no obvious rashes or skin changes. No nail dyscrasia. NEURO:  Nonfocal. Well oriented.  Appropriate affect. ECOG PERFORMANCE STATUS: 1 -  Symptomatic but completely ambulatory   Lab Results: Lab Results  Component Value Date   WBC 4.5 03/24/2014   HGB 11.2* 03/24/2014   HCT 33.7* 03/24/2014   MCV 88.0 03/24/2014   PLT 245 03/24/2014     Chemistry      Component Value Date/Time   NA 139 03/24/2014 0841   NA 142 01/31/2014 1143   K 3.6 03/24/2014 0841   K 4.4 01/31/2014 1143   CL 104 01/31/2014 1143   CO2 25 03/24/2014 0841   CO2 24 01/31/2014 1143   BUN 15.5 03/24/2014 0841   BUN 18 01/31/2014 1143   CREATININE 0.8 03/24/2014 0841   CREATININE 0.72 01/31/2014 1143      Component Value Date/Time   CALCIUM 9.6 03/24/2014 0841   CALCIUM 9.2 01/31/2014 1143   ALKPHOS 98 03/24/2014 0841   AST 15 03/24/2014 0841   ALT 29 03/24/2014 0841   BILITOT 0.73 03/24/2014 0841        Assessment and Plan: Welford Roche 58 y.o. female with  1. Stage IIB ER positive, HER-2/neu positive infiltrating ductal carcinoma of the right breast.  She is currently undergoing neoadjuvant chemotherapy with Taxotere, Carboplatin, Herceptin, Perjeta given on day 1 of a 21 day cycle with Neulasta given on day 2 for granulocyte support.  She is currently  cycle 3 day 8 of treatment.  Her labs are stable today.  I reviewed them with her in detail.  She tolerated treatment moderately well.  2.  Nausea:  I offered the patient IV hydration and IV anti-emetics and she declined.  She will continue to drink fluids and take her anti-emetics as needed.    3. Insomnia: I gave her reading information on improving her sleep pattern.  I also prescribed Ambien CR #30.  4.  Neuropathy:  This is very mild.  I recommended Super B complex daily.     The patient will return in two weeks for labs, evaluation, and cycle 4 of chemotherapy.  She knows to call us in the interim for any questions or concerns.  We can certainly see her sooner if needed.     I spent 25 minutes counseling the patient face to face.  The total time spent in the appointment was 30 minutes.  Minette Headland, [place] [phone] 03/25/2014 4:02 PM

## 2014-03-25 ENCOUNTER — Telehealth: Payer: Self-pay | Admitting: *Deleted

## 2014-03-25 NOTE — Telephone Encounter (Signed)
Mailed after appt letter to pt. 

## 2014-03-28 ENCOUNTER — Telehealth: Payer: Self-pay | Admitting: Dietician

## 2014-03-28 NOTE — Telephone Encounter (Signed)
Brief Outpatient Oncology Nutrition Note  Patient has been identified to be at risk on malnutrition screen.  Wt Readings from Last 10 Encounters:  03/24/14 197 lb 4.8 oz (89.495 kg)  03/17/14 203 lb 3.2 oz (92.171 kg)  03/03/14 195 lb 12.8 oz (88.814 kg)  02/24/14 203 lb 12.8 oz (92.443 kg)  02/10/14 202 lb (91.627 kg)  02/10/14 200 lb (90.719 kg)  02/03/14 208 lb 3.2 oz (94.439 kg)  01/31/14 207 lb (93.895 kg)  01/31/14 207 lb (93.895 kg)  01/31/14 207 lb (93.895 kg)    Dx:  Stage IIB ER positive, HER-2/neu positive infiltrating ductal carcinoma of the right breast. She is currently undergoing neoadjuvant chemotherapy with Taxotere, Carboplatin, Herceptin, Perjeta given on day 1 of a 21 day cycle with Neulasta given on day 2 for granulocyte support  Patient of Dr. Humphrey Rolls.  Called patient due to weight loss and documented difficulty with decreased appetite, taste changes, diarrhea, and nausea.  Spoke with patient's mother who stated that patient is eating well but does not like to drink fluids although she is making herself to maintain hydration.  States that she has meds for nausea as needed.    [place] RD available as needed and encouraged to call for any nutrition needs/questions.  Antonieta Iba, RD, LDN

## 2014-03-30 ENCOUNTER — Telehealth: Payer: Self-pay | Admitting: Adult Health

## 2014-03-30 ENCOUNTER — Telehealth: Payer: Self-pay | Admitting: *Deleted

## 2014-03-30 NOTE — Telephone Encounter (Signed)
Have adjusted 4/16 appt

## 2014-03-30 NOTE — Telephone Encounter (Signed)
, °

## 2014-03-31 ENCOUNTER — Telehealth: Payer: Self-pay | Admitting: Oncology

## 2014-03-31 ENCOUNTER — Other Ambulatory Visit: Payer: Self-pay | Admitting: *Deleted

## 2014-03-31 NOTE — Telephone Encounter (Signed)
Pt is aware of her 04/04/2014 appt per 4/9 pof

## 2014-03-31 NOTE — Progress Notes (Signed)
Patient called with concern of having "bumps that have come up across both cheeks and over nose. They look like blisters. Itch a little. They are enclosed, no oozing. Only on my face, no where else on my body." Per Mendel Ryder, NP, take a picture of them without wearing any makeup. Also, Mendel Ryder would like to see patient. Scheduled for Monday, 4/13 at 10:15. If the bumps go away before visit with Mendel Ryder, she doesn't need to be seen. Patient verbalized understanding.

## 2014-04-04 ENCOUNTER — Ambulatory Visit: Payer: BC Managed Care – PPO | Admitting: Adult Health

## 2014-04-07 ENCOUNTER — Encounter: Payer: Self-pay | Admitting: Adult Health

## 2014-04-07 ENCOUNTER — Ambulatory Visit (HOSPITAL_BASED_OUTPATIENT_CLINIC_OR_DEPARTMENT_OTHER): Payer: BC Managed Care – PPO

## 2014-04-07 ENCOUNTER — Telehealth: Payer: Self-pay | Admitting: *Deleted

## 2014-04-07 ENCOUNTER — Ambulatory Visit: Payer: BC Managed Care – PPO | Admitting: Adult Health

## 2014-04-07 ENCOUNTER — Telehealth: Payer: Self-pay | Admitting: Oncology

## 2014-04-07 ENCOUNTER — Ambulatory Visit (HOSPITAL_BASED_OUTPATIENT_CLINIC_OR_DEPARTMENT_OTHER): Payer: BC Managed Care – PPO | Admitting: Adult Health

## 2014-04-07 ENCOUNTER — Other Ambulatory Visit: Payer: BC Managed Care – PPO

## 2014-04-07 ENCOUNTER — Other Ambulatory Visit (HOSPITAL_BASED_OUTPATIENT_CLINIC_OR_DEPARTMENT_OTHER): Payer: BC Managed Care – PPO

## 2014-04-07 VITALS — BP 116/67 | HR 85 | Temp 98.2°F | Resp 18 | Ht 64.0 in | Wt 201.5 lb

## 2014-04-07 DIAGNOSIS — Z5112 Encounter for antineoplastic immunotherapy: Secondary | ICD-10-CM

## 2014-04-07 DIAGNOSIS — G5690 Unspecified mononeuropathy of unspecified upper limb: Secondary | ICD-10-CM

## 2014-04-07 DIAGNOSIS — C50519 Malignant neoplasm of lower-outer quadrant of unspecified female breast: Secondary | ICD-10-CM

## 2014-04-07 DIAGNOSIS — Z5111 Encounter for antineoplastic chemotherapy: Secondary | ICD-10-CM

## 2014-04-07 DIAGNOSIS — C773 Secondary and unspecified malignant neoplasm of axilla and upper limb lymph nodes: Secondary | ICD-10-CM

## 2014-04-07 DIAGNOSIS — C50919 Malignant neoplasm of unspecified site of unspecified female breast: Secondary | ICD-10-CM

## 2014-04-07 DIAGNOSIS — G4700 Insomnia, unspecified: Secondary | ICD-10-CM

## 2014-04-07 DIAGNOSIS — Z170 Estrogen receptor positive status [ER+]: Secondary | ICD-10-CM

## 2014-04-07 LAB — COMPREHENSIVE METABOLIC PANEL (CC13)
ALBUMIN: 3.6 g/dL (ref 3.5–5.0)
ALT: 18 U/L (ref 0–55)
AST: 14 U/L (ref 5–34)
Alkaline Phosphatase: 87 U/L (ref 40–150)
Anion Gap: 12 mEq/L — ABNORMAL HIGH (ref 3–11)
BUN: 19.9 mg/dL (ref 7.0–26.0)
CO2: 25 mEq/L (ref 22–29)
Calcium: 10 mg/dL (ref 8.4–10.4)
Chloride: 106 mEq/L (ref 98–109)
Creatinine: 0.8 mg/dL (ref 0.6–1.1)
Glucose: 116 mg/dl (ref 70–140)
POTASSIUM: 3.6 meq/L (ref 3.5–5.1)
SODIUM: 144 meq/L (ref 136–145)
Total Bilirubin: 0.33 mg/dL (ref 0.20–1.20)
Total Protein: 6.9 g/dL (ref 6.4–8.3)

## 2014-04-07 LAB — CBC WITH DIFFERENTIAL/PLATELET
BASO%: 0.2 % (ref 0.0–2.0)
Basophils Absolute: 0 10*3/uL (ref 0.0–0.1)
EOS%: 0 % (ref 0.0–7.0)
Eosinophils Absolute: 0 10*3/uL (ref 0.0–0.5)
HCT: 30 % — ABNORMAL LOW (ref 34.8–46.6)
HGB: 10.1 g/dL — ABNORMAL LOW (ref 11.6–15.9)
LYMPH%: 5.8 % — ABNORMAL LOW (ref 14.0–49.7)
MCH: 30.4 pg (ref 25.1–34.0)
MCHC: 33.6 g/dL (ref 31.5–36.0)
MCV: 90.4 fL (ref 79.5–101.0)
MONO#: 0.6 10*3/uL (ref 0.1–0.9)
MONO%: 3.7 % (ref 0.0–14.0)
NEUT%: 90.3 % — ABNORMAL HIGH (ref 38.4–76.8)
NEUTROS ABS: 13.5 10*3/uL — AB (ref 1.5–6.5)
Platelets: 265 10*3/uL (ref 145–400)
RBC: 3.31 10*6/uL — AB (ref 3.70–5.45)
RDW: 20.7 % — AB (ref 11.2–14.5)
WBC: 15 10*3/uL — ABNORMAL HIGH (ref 3.9–10.3)
lymph#: 0.9 10*3/uL (ref 0.9–3.3)

## 2014-04-07 MED ORDER — DEXAMETHASONE SODIUM PHOSPHATE 20 MG/5ML IJ SOLN
20.0000 mg | Freq: Once | INTRAMUSCULAR | Status: AC
  Administered 2014-04-07: 20 mg via INTRAVENOUS

## 2014-04-07 MED ORDER — DIPHENHYDRAMINE HCL 25 MG PO CAPS
50.0000 mg | ORAL_CAPSULE | Freq: Once | ORAL | Status: AC
  Administered 2014-04-07: 50 mg via ORAL

## 2014-04-07 MED ORDER — SODIUM CHLORIDE 0.9 % IV SOLN
420.0000 mg | Freq: Once | INTRAVENOUS | Status: AC
  Administered 2014-04-07: 420 mg via INTRAVENOUS
  Filled 2014-04-07: qty 14

## 2014-04-07 MED ORDER — ONDANSETRON 16 MG/50ML IVPB (CHCC)
INTRAVENOUS | Status: AC
  Filled 2014-04-07: qty 16

## 2014-04-07 MED ORDER — HEPARIN SOD (PORK) LOCK FLUSH 100 UNIT/ML IV SOLN
500.0000 [IU] | Freq: Once | INTRAVENOUS | Status: AC | PRN
  Administered 2014-04-07: 500 [IU]
  Filled 2014-04-07: qty 5

## 2014-04-07 MED ORDER — SODIUM CHLORIDE 0.9 % IV SOLN
Freq: Once | INTRAVENOUS | Status: AC
  Administered 2014-04-07: 14:00:00 via INTRAVENOUS

## 2014-04-07 MED ORDER — ACETAMINOPHEN 325 MG PO TABS
650.0000 mg | ORAL_TABLET | Freq: Once | ORAL | Status: AC
  Administered 2014-04-07: 650 mg via ORAL

## 2014-04-07 MED ORDER — ACETAMINOPHEN 325 MG PO TABS
ORAL_TABLET | ORAL | Status: AC
  Filled 2014-04-07: qty 2

## 2014-04-07 MED ORDER — TRASTUZUMAB CHEMO INJECTION 440 MG
6.0000 mg/kg | Freq: Once | INTRAVENOUS | Status: AC
  Administered 2014-04-07: 567 mg via INTRAVENOUS
  Filled 2014-04-07: qty 27

## 2014-04-07 MED ORDER — SODIUM CHLORIDE 0.9 % IV SOLN
75.0000 mg/m2 | Freq: Once | INTRAVENOUS | Status: AC
  Administered 2014-04-07: 150 mg via INTRAVENOUS
  Filled 2014-04-07: qty 15

## 2014-04-07 MED ORDER — DEXAMETHASONE SODIUM PHOSPHATE 20 MG/5ML IJ SOLN
INTRAMUSCULAR | Status: AC
  Filled 2014-04-07: qty 5

## 2014-04-07 MED ORDER — SODIUM CHLORIDE 0.9 % IV SOLN
765.6000 mg | Freq: Once | INTRAVENOUS | Status: AC
  Administered 2014-04-07: 770 mg via INTRAVENOUS
  Filled 2014-04-07: qty 77

## 2014-04-07 MED ORDER — SODIUM CHLORIDE 0.9 % IJ SOLN
10.0000 mL | INTRAMUSCULAR | Status: DC | PRN
  Administered 2014-04-07: 10 mL
  Filled 2014-04-07: qty 10

## 2014-04-07 MED ORDER — DIPHENHYDRAMINE HCL 25 MG PO CAPS
ORAL_CAPSULE | ORAL | Status: AC
  Filled 2014-04-07: qty 2

## 2014-04-07 MED ORDER — ONDANSETRON 16 MG/50ML IVPB (CHCC)
16.0000 mg | Freq: Once | INTRAVENOUS | Status: AC
  Administered 2014-04-07: 16 mg via INTRAVENOUS

## 2014-04-07 MED ORDER — DEXAMETHASONE 4 MG PO TABS: 8.0000 mg | ORAL_TABLET | Freq: Two times a day (BID) | ORAL | Status: DC

## 2014-04-07 NOTE — Telephone Encounter (Signed)
, °

## 2014-04-07 NOTE — Patient Instructions (Signed)
[facility] Discharge Instructions for Patients Receiving Chemotherapy  Today you received the following chemotherapy agents Herceptin/Perjeta/Taxotere To help prevent nausea and vomiting after your treatment, we encourage you to take your nausea medication as prescribed.  If you develop nausea and vomiting that is not controlled by your nausea medication, call the clinic.   BELOW ARE SYMPTOMS THAT SHOULD BE REPORTED IMMEDIATELY:  *FEVER GREATER THAN 100.5 F  *CHILLS WITH OR WITHOUT FEVER  NAUSEA AND VOMITING THAT IS NOT CONTROLLED WITH YOUR NAUSEA MEDICATION  *UNUSUAL SHORTNESS OF BREATH  *UNUSUAL BRUISING OR BLEEDING  TENDERNESS IN MOUTH AND THROAT WITH OR WITHOUT PRESENCE OF ULCERS  *URINARY PROBLEMS  *BOWEL PROBLEMS  UNUSUAL RASH Items with * indicate a potential emergency and should be followed up as soon as possible.  Feel free to call the clinic you have any questions or concerns. The clinic phone number is [phone].    

## 2014-04-07 NOTE — Patient Instructions (Signed)
You are doing well.  Your labs are stable.  You will proceed with chemotherapy today.  I have ordered the MRI of your breasts.  Please call us if you have any questions or concerns.    We will see you back in one week.

## 2014-04-07 NOTE — Progress Notes (Signed)
Hematology and Oncology Follow Up Visit  Madeline Davidson [id number] 07-26-1956 58 y.o. 04/09/2014 10:09 AM     Principle Diagnosis:Madeline Davidson 58 y.o. 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    Prior Therapy:  #1 Patient on 12/16/2013 felt a lump in her right breast. She states it was nickel size. Because of this she 1 on to see her nurse practitioner. On 12/31/2013 a mammogram was ordered. This showed a lesion in the 7:00 position measuring 3.3 cm. She was also noted to have enlarged lymph nodes. Subsequent ultrasound showed a 2.2 x 2.4 cm mass with enlarged lymph node measuring 1.6 cm. This was biopsied in [place]. 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 Lymph node was positive for metastatic disease. Prognostic markers revealed the tumor to be estrogen receptor +90% progesterone receptor 1% HER-2/neu positive   #2 staging PET/CT performed on 78/24/2353:  Hypermetabolic mass posterior right breast with SUV max 13.6 and  measuring 13.2 cm (image 108). There two 12 in 16 mm hypermetabolic  nodules in the posterior right breast versus lymph nodes with  intense metabolic activity SUV max 10.9. No hypermetabolic  supraclavicular internal mammary nodes. No hypermetabolic  mediastinal lymph nodes. No suspicious pulmonary nodules.  ABDOMEN/PELVIS  There are 3 hypermetabolic periportal lymph nodes. The largest  measures 20 mm with SUV max 13.6. Additional small less than 10 mm  short axis nodes on image 144 and 130 also have intense metabolic  activity. No abnormal metabolic activity within the liver. No  hypermetabolic abdominal pelvic lymph nodes.   Current therapy:  Neoadjuvant Taxotere, Carboplatin, Herceptin, Perjeta cycle 4 day 1  Interim History: Madeline Davidson 58 y.o. 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  She is here for  evaluation priro to receiving Taxotere, Carboplatin, herceptin, perjeta today.  She is feeling very well.  She has been sleeping well at night recently.  She has not yet taken the Ambien CR that I prescribed at her last appointment.  She is planning on taking it on Friday night this week.  She does endorse mild intermittent numbness in her fingertips.  It is not interfering with her activities of daily living.  She denies fevers, chills, nausea, vomiting, constipation, diarrhea, mouth pain, skin changes, nail changes, or any further concerns.  A 10 point ROS is otherwise negative.    Medications:  Current Outpatient Prescriptions  Medication Sig Dispense Refill  . cetirizine (ZYRTEC) 10 MG tablet Take 10 mg by mouth daily.      . clonazePAM (KLONOPIN) 0.5 MG tablet Take 0.5 mg by mouth as needed for anxiety (take half a tablet as needed).      Marland Kitchen dexamethasone (DECADRON) 4 MG tablet Take 2 tablets (8 mg total) by mouth 2 (two) times daily with a meal. Take two times a day the day before Taxotere. Then take two times a day starting the day after chemo for 3 days.  30 tablet  1  . lidocaine-prilocaine (EMLA) cream Apply topically as needed.  30 g  6  . lisinopril-hydrochlorothiazide (PRINZIDE,ZESTORETIC) 10-12.5 MG per tablet Take 1 tablet by mouth daily.      Marland Kitchen zolpidem (AMBIEN) 10 MG tablet Take 10 mg by mouth at bedtime as needed for sleep (take half a tablet at night).      Marland Kitchen ibuprofen (ADVIL,MOTRIN) 800 MG tablet Take 800 mg by mouth as needed.      Marland Kitchen  LORazepam (ATIVAN) 0.5 MG tablet Take 1 tablet (0.5 mg total) by mouth every 6 (six) hours as needed (Nausea or vomiting).  30 tablet  0  . ondansetron (ZOFRAN) 8 MG tablet Take 1 tablet (8 mg total) by mouth 2 (two) times daily. Take two times a day starting the day after chemo for 3 days. Then take two times a day as needed for nausea or vomiting.  30 tablet  1  . prochlorperazine (COMPAZINE) 10 MG tablet Take 1 tablet (10 mg total) by mouth every 6  (six) hours as needed (Nausea or vomiting).  30 tablet  1  . zolpidem (AMBIEN CR) 12.5 MG CR tablet Take 1 tablet (12.5 mg total) by mouth at bedtime as needed for sleep.  30 tablet  0   No current facility-administered medications for this visit.   Facility-Administered Medications Ordered in Other Visits  Medication Dose Route Frequency Provider Last Rate Last Dose  . sodium chloride 0.9 % injection 10 mL  10 mL Intracatheter PRN [name], MD   10 mL at 02/03/14 1759     Allergies:  Allergies  Allergen Reactions  . Ciprofloxacin Other (See Comments)    Patient had a headache after taking Cipro. Made her sinus infection symptoms worse.    Medical History: Past Medical History  Diagnosis Date  . 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 Hypertension   . Allergy   . Hypertension 01/10/2014  . Wears glasses     Surgical History:  Past Surgical History  Procedure Laterality Date  . Colon resection  2004  . Hernia repair  2005    umb  . Cholecystectomy  2005  . Portacath placement Left 01/31/2014    Procedure: INSERTION PORT-A-CATH;  Surgeon: Rolm Bookbinder, MD;  Location: [place];  Service: General;  Laterality: Left;     Review of Systems: A 10 point review of systems was conducted and is otherwise negative except for what is noted above.     Physical Exam: Blood pressure 116/67, pulse 85, temperature 98.2 F (36.8 C), temperature source Oral, resp. rate 18, height _0  (1.626 m), weight 201 lb 8 oz (91.4 kg). GENERAL: Patient is a well appearing older female in no acute distress HEENT:  Sclerae anicteric.  Oropharynx clear and moist. No ulcerations or evidence of oropharyngeal candidiasis. Neck is supple.  NODES:  No cervical, supraclavicular, or axillary lymphadenopathy palpated.  BREAST EXAM:  Deferred. LUNGS:  Clear to auscultation bilaterally.  No wheezes or rhonchi. HEART:  Regular rate and rhythm. No  murmur appreciated. ABDOMEN:  Soft, nontender.  Positive, normoactive bowel sounds. No organomegaly palpated. MSK:  No focal spinal tenderness to palpation. Full range of motion bilaterally in the upper extremities. EXTREMITIES:  No peripheral edema.   SKIN:  Clear with no obvious rashes or skin changes. No nail dyscrasia. NEURO:  Nonfocal. Well oriented.  Appropriate affect. ECOG PERFORMANCE STATUS: 1 - Symptomatic but completely ambulatory   Lab Results: Lab Results  Component Value Date   WBC 15.0* 04/07/2014   HGB 10.1* 04/07/2014   HCT 30.0* 04/07/2014   MCV 90.4 04/07/2014   PLT 265 04/07/2014     Chemistry      Component Value Date/Time   NA 144 04/07/2014 1145   NA 142 01/31/2014 1143   K 3.6 04/07/2014 1145   K 4.4 01/31/2014 1143   CL 104 01/31/2014 1143   CO2 25 04/07/2014  1145   CO2 24 01/31/2014 1143   BUN 19.9 04/07/2014 1145   BUN 18 01/31/2014 1143   CREATININE 0.8 04/07/2014 1145   CREATININE 0.72 01/31/2014 1143      Component Value Date/Time   CALCIUM 10.0 04/07/2014 1145   CALCIUM 9.2 01/31/2014 1143   ALKPHOS 87 04/07/2014 1145   AST 14 04/07/2014 1145   ALT 18 04/07/2014 1145   BILITOT 0.33 04/07/2014 1145      Assessment and Plan: Welford Roche 58 y.o. female with   1. Stage IIB ER Positive, HER-2/neu positive infiltrating ductal carcinoma of the right breast.  She is currently undergoing neoadjuvant chemotherapy with Taxotere, Carboplatin, Herceptin, Perjeta given on day 1 of a 21 day cycle with Neulasta given on day 2 for granulocyte support. Her labs are stable.  I reviewed them with her today.  She will proceed with treatment cycle 4 day 1 today.  I ordered a repeat MRI of her breasts today as well.    2. Insomnia.  She is sleeping well at night.  She has not yet started Ambien CR.  She is planning on taking it on Friday night.    3. Neuropathy.  I recommended the patient take super b complex daily.  Should it not be improved with this we will start gabapentin next  week.     The patient will return in one week for labs and evaluation for chemotoxicities.  Her anticipated chemotherapy completion date is 05/19/14.  She knows to call us in the interim for any questions or concerns.  We can certainly see her sooner if needed.  I spent 25 minutes counseling the patient face to face.  The total time spent in the appointment was 30 minutes.  Minette Headland, [place] [phone] 04/09/2014 10:09 AM

## 2014-04-07 NOTE — Telephone Encounter (Signed)
Per staff message and POF I have scheduled appts.  JMW  

## 2014-04-08 ENCOUNTER — Ambulatory Visit (HOSPITAL_BASED_OUTPATIENT_CLINIC_OR_DEPARTMENT_OTHER): Payer: BC Managed Care – PPO

## 2014-04-08 VITALS — BP 121/57 | HR 94 | Temp 98.2°F

## 2014-04-08 DIAGNOSIS — C50919 Malignant neoplasm of unspecified site of unspecified female breast: Secondary | ICD-10-CM

## 2014-04-08 DIAGNOSIS — C50519 Malignant neoplasm of lower-outer quadrant of unspecified female breast: Secondary | ICD-10-CM

## 2014-04-08 DIAGNOSIS — Z5189 Encounter for other specified aftercare: Secondary | ICD-10-CM

## 2014-04-08 DIAGNOSIS — C773 Secondary and unspecified malignant neoplasm of axilla and upper limb lymph nodes: Secondary | ICD-10-CM

## 2014-04-08 MED ORDER — PEGFILGRASTIM INJECTION 6 MG/0.6ML
6.0000 mg | Freq: Once | SUBCUTANEOUS | Status: AC
  Administered 2014-04-08: 6 mg via SUBCUTANEOUS
  Filled 2014-04-08: qty 0.6

## 2014-04-14 ENCOUNTER — Other Ambulatory Visit (HOSPITAL_BASED_OUTPATIENT_CLINIC_OR_DEPARTMENT_OTHER): Payer: BC Managed Care – PPO

## 2014-04-14 ENCOUNTER — Ambulatory Visit (HOSPITAL_BASED_OUTPATIENT_CLINIC_OR_DEPARTMENT_OTHER): Payer: BC Managed Care – PPO

## 2014-04-14 ENCOUNTER — Telehealth: Payer: Self-pay | Admitting: Oncology

## 2014-04-14 ENCOUNTER — Encounter: Payer: Self-pay | Admitting: Adult Health

## 2014-04-14 ENCOUNTER — Ambulatory Visit (HOSPITAL_BASED_OUTPATIENT_CLINIC_OR_DEPARTMENT_OTHER): Payer: BC Managed Care – PPO | Admitting: Adult Health

## 2014-04-14 VITALS — BP 117/77 | HR 139 | Temp 98.3°F | Resp 18 | Ht 64.0 in | Wt 191.3 lb

## 2014-04-14 DIAGNOSIS — C50519 Malignant neoplasm of lower-outer quadrant of unspecified female breast: Secondary | ICD-10-CM

## 2014-04-14 DIAGNOSIS — Z170 Estrogen receptor positive status [ER+]: Secondary | ICD-10-CM

## 2014-04-14 DIAGNOSIS — C50919 Malignant neoplasm of unspecified site of unspecified female breast: Secondary | ICD-10-CM

## 2014-04-14 DIAGNOSIS — R110 Nausea: Secondary | ICD-10-CM

## 2014-04-14 DIAGNOSIS — C773 Secondary and unspecified malignant neoplasm of axilla and upper limb lymph nodes: Secondary | ICD-10-CM

## 2014-04-14 DIAGNOSIS — R197 Diarrhea, unspecified: Secondary | ICD-10-CM

## 2014-04-14 DIAGNOSIS — G4700 Insomnia, unspecified: Secondary | ICD-10-CM

## 2014-04-14 DIAGNOSIS — E876 Hypokalemia: Secondary | ICD-10-CM

## 2014-04-14 DIAGNOSIS — E860 Dehydration: Secondary | ICD-10-CM

## 2014-04-14 DIAGNOSIS — G589 Mononeuropathy, unspecified: Secondary | ICD-10-CM

## 2014-04-14 LAB — COMPREHENSIVE METABOLIC PANEL (CC13)
ALT: 22 U/L (ref 0–55)
ANION GAP: 16 meq/L — AB (ref 3–11)
AST: 14 U/L (ref 5–34)
Albumin: 3.5 g/dL (ref 3.5–5.0)
Alkaline Phosphatase: 90 U/L (ref 40–150)
BILIRUBIN TOTAL: 0.68 mg/dL (ref 0.20–1.20)
BUN: 24.1 mg/dL (ref 7.0–26.0)
CALCIUM: 9.5 mg/dL (ref 8.4–10.4)
CHLORIDE: 98 meq/L (ref 98–109)
CO2: 22 mEq/L (ref 22–29)
Creatinine: 0.9 mg/dL (ref 0.6–1.1)
Glucose: 151 mg/dl — ABNORMAL HIGH (ref 70–140)
Potassium: 3.3 mEq/L — ABNORMAL LOW (ref 3.5–5.1)
SODIUM: 137 meq/L (ref 136–145)
TOTAL PROTEIN: 6.4 g/dL (ref 6.4–8.3)

## 2014-04-14 LAB — CBC WITH DIFFERENTIAL/PLATELET
BASO%: 0.2 % (ref 0.0–2.0)
Basophils Absolute: 0 10*3/uL (ref 0.0–0.1)
EOS ABS: 0 10*3/uL (ref 0.0–0.5)
EOS%: 0 % (ref 0.0–7.0)
HCT: 32.4 % — ABNORMAL LOW (ref 34.8–46.6)
HGB: 11.2 g/dL — ABNORMAL LOW (ref 11.6–15.9)
LYMPH%: 20.5 % (ref 14.0–49.7)
MCH: 30.6 pg (ref 25.1–34.0)
MCHC: 34.6 g/dL (ref 31.5–36.0)
MCV: 88.5 fL (ref 79.5–101.0)
MONO#: 1.7 10*3/uL — AB (ref 0.1–0.9)
MONO%: 32.7 % — ABNORMAL HIGH (ref 0.0–14.0)
NEUT%: 46.6 % (ref 38.4–76.8)
NEUTROS ABS: 2.5 10*3/uL (ref 1.5–6.5)
NRBC: 0 % (ref 0–0)
Platelets: 279 10*3/uL (ref 145–400)
RBC: 3.66 10*6/uL — AB (ref 3.70–5.45)
RDW: 18.6 % — AB (ref 11.2–14.5)
WBC: 5.3 10*3/uL (ref 3.9–10.3)
lymph#: 1.1 10*3/uL (ref 0.9–3.3)

## 2014-04-14 MED ORDER — PROCHLORPERAZINE 25 MG RE SUPP: 25.0000 mg | Freq: Two times a day (BID) | RECTAL | Status: DC | PRN

## 2014-04-14 MED ORDER — ONDANSETRON 16 MG/50ML IVPB (CHCC)
INTRAVENOUS | Status: AC
  Filled 2014-04-14: qty 16

## 2014-04-14 MED ORDER — POTASSIUM CHLORIDE ER 8 MEQ PO CPCR: 16.0000 meq | ORAL_CAPSULE | Freq: Every day | ORAL | Status: DC

## 2014-04-14 MED ORDER — HEPARIN SOD (PORK) LOCK FLUSH 100 UNIT/ML IV SOLN
500.0000 [IU] | Freq: Once | INTRAVENOUS | Status: AC
  Administered 2014-04-14: 500 [IU] via INTRAVENOUS
  Filled 2014-04-14: qty 5

## 2014-04-14 MED ORDER — SODIUM CHLORIDE 0.9 % IJ SOLN
10.0000 mL | INTRAMUSCULAR | Status: DC | PRN
  Administered 2014-04-14: 10 mL via INTRAVENOUS
  Filled 2014-04-14: qty 10

## 2014-04-14 MED ORDER — ONDANSETRON 16 MG/50ML IVPB (CHCC)
16.0000 mg | Freq: Once | INTRAVENOUS | Status: AC
  Administered 2014-04-14: 16 mg via INTRAVENOUS

## 2014-04-14 MED ORDER — SODIUM CHLORIDE 0.9 % IV SOLN
Freq: Once | INTRAVENOUS | Status: AC
  Administered 2014-04-14: 10:00:00 via INTRAVENOUS

## 2014-04-14 NOTE — Progress Notes (Signed)
Hematology and Oncology Follow Up Visit  Madeline Davidson [id number] Jan 20, 1956 58 y.o. 04/16/2014 3:58 PM     Principle Diagnosis:Madeline Davidson 58 y.o. 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   Prior Therapy:  #1 Patient on 12/16/2013 felt a lump in her right breast. She states it was nickel size. Because of this she 1 on to see her nurse practitioner. On 12/31/2013 a mammogram was ordered. This showed a lesion in the 7:00 position measuring 3.3 cm. She was also noted to have enlarged lymph nodes. Subsequent ultrasound showed a 2.2 x 2.4 cm mass with enlarged lymph node measuring 1.6 cm. This was biopsied in [place]. 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 Lymph node was positive for metastatic disease. Prognostic markers revealed the tumor to be estrogen receptor +90% progesterone receptor 1% HER-2/neu positive   #2 staging PET/CT performed on 37/16/9678:  Hypermetabolic mass posterior right breast with SUV max 13.6 and  measuring 13.2 cm (image 108). There two 12 in 16 mm hypermetabolic  nodules in the posterior right breast versus lymph nodes with  intense metabolic activity SUV max 10.9. No hypermetabolic  supraclavicular internal mammary nodes. No hypermetabolic  mediastinal lymph nodes. No suspicious pulmonary nodules.  ABDOMEN/PELVIS  There are 3 hypermetabolic periportal lymph nodes. The largest  measures 20 mm with SUV max 13.6. Additional small less than 10 mm  short axis nodes on image 144 and 130 also have intense metabolic  activity. No abnormal metabolic activity within the liver. No  hypermetabolic abdominal pelvic lymph nodes.   Current therapy:  Taxotere, Carboplatin, Herceptin, Perjeta cycle 4 day 8  Interim History: Madeline Davidson 58 y.o. 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  She is here to be evaluated  after receiving cycle 4 of 6 planned treatments of Taxotere, Carboplatin, Herceptin, and Perjeta.  She is receiving this treatment in the neaodjuvant setting on day 1 of a 21 day cycle with Neulasta support on day 2.    The patient is here on cycle 4 day 8 of treatment.  She is feeling nauseated and is having difficulty taking her medications.  She is also having diarrhea that has been going on since Tuesday.  She is having multiple watery bowel movements.  She is taking Loperamide PRN and has noticed a significant improvement in the past 24 hours.  She does continue to have mild numbness and tingling in her fingertips and toes.  She had started taking Super B complex prior to her nausea and diarrhea, and she did notice an improvement in the numbness.  She has started taking Ambien CR and has noticed a great improvement in her insomnia.    Medications:  Current Outpatient Prescriptions  Medication Sig Dispense Refill  . cetirizine (ZYRTEC) 10 MG tablet Take 10 mg by mouth daily.      . clonazePAM (KLONOPIN) 0.5 MG tablet Take 0.5 mg by mouth as needed for anxiety (take half a tablet as needed).      Marland Kitchen lidocaine-prilocaine (EMLA) cream Apply topically as needed.  30 g  6  . lisinopril-hydrochlorothiazide (PRINZIDE,ZESTORETIC) 10-12.5 MG per tablet Take 1 tablet by mouth daily.      Marland Kitchen zolpidem (AMBIEN CR) 12.5 MG CR tablet Take 1 tablet (12.5 mg total) by mouth at bedtime as needed for sleep.  30 tablet  0  . dexamethasone (DECADRON) 4 MG tablet Take 2 tablets (8 mg total)  by mouth 2 (two) times daily with a meal. Take two times a day the day before Taxotere. Then take two times a day starting the day after chemo for 3 days.  30 tablet  1  . ibuprofen (ADVIL,MOTRIN) 800 MG tablet Take 800 mg by mouth as needed.      Marland Kitchen LORazepam (ATIVAN) 0.5 MG tablet Take 1 tablet (0.5 mg total) by mouth every 6 (six) hours as needed (Nausea or vomiting).  30 tablet  0  . ondansetron (ZOFRAN) 8 MG tablet Take 1 tablet  (8 mg total) by mouth 2 (two) times daily. Take two times a day starting the day after chemo for 3 days. Then take two times a day as needed for nausea or vomiting.  30 tablet  1  . Potassium Chloride CR (MICRO-K) 8 MEQ CPCR capsule CR Take 2 capsules (16 mEq total) by mouth daily.  10 capsule  0  . prochlorperazine (COMPAZINE) 10 MG tablet Take 1 tablet (10 mg total) by mouth every 6 (six) hours as needed (Nausea or vomiting).  30 tablet  1  . prochlorperazine (COMPAZINE) 25 MG suppository Place 1 suppository (25 mg total) rectally every 12 (twelve) hours as needed for nausea or vomiting.  12 suppository  0  . zolpidem (AMBIEN) 10 MG tablet Take 10 mg by mouth at bedtime as needed for sleep (take half a tablet at night).       No current facility-administered medications for this visit.   Facility-Administered Medications Ordered in Other Visits  Medication Dose Route Frequency Provider Last Rate Last Dose  . sodium chloride 0.9 % injection 10 mL  10 mL Intracatheter PRN [name], MD   10 mL at 02/03/14 1759     Allergies:  Allergies  Allergen Reactions  . Ciprofloxacin Other (See Comments)    Patient had a headache after taking Cipro. Made her sinus infection symptoms worse.    Medical History: Past Medical History  Diagnosis Date  . 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 Hypertension   . Allergy   . Hypertension 01/10/2014  . Wears glasses     Surgical History:  Past Surgical History  Procedure Laterality Date  . Colon resection  2004  . Hernia repair  2005    umb  . Cholecystectomy  2005  . Portacath placement Left 01/31/2014    Procedure: INSERTION PORT-A-CATH;  Surgeon: Rolm Bookbinder, MD;  Location: [facility];  Service: General;  Laterality: Left;     Review of Systems: A 10 point review of systems was conducted and is otherwise negative except for what is noted above.    Physical Exam: Blood pressure  117/77, pulse 139, temperature 98.3 F (36.8 C), temperature source Oral, resp. rate 18, height $RemoveBe'5\' 4"'bGdthVyYF$  (1.626 m), weight 191 lb 4.8 oz (86.773 kg). GENERAL: Patient is a well appearing female in no acute distress HEENT:  Sclerae anicteric.  Oropharynx clear and moist. No ulcerations or evidence of oropharyngeal candidiasis. Neck is supple.  NODES:  No cervical, supraclavicular, or axillary lymphadenopathy palpated.  BREAST EXAM:  Deferred. LUNGS:  Clear to auscultation bilaterally.  No wheezes or rhonchi. HEART:  Regular rate and rhythm. No murmur appreciated. ABDOMEN:  Soft, nontender.  Positive, normoactive bowel sounds. No organomegaly palpated. MSK:  No focal spinal tenderness to palpation. Full range of motion bilaterally in the upper extremities. EXTREMITIES:  No peripheral edema.   SKIN:  Clear with  no obvious rashes or skin changes. No nail dyscrasia. NEURO:  Nonfocal. Well oriented.  Appropriate affect. ECOG PERFORMANCE STATUS: 1 - Symptomatic but completely ambulatory  Lab Results: Lab Results  Component Value Date   WBC 5.3 04/14/2014   HGB 11.2* 04/14/2014   HCT 32.4* 04/14/2014   MCV 88.5 04/14/2014   PLT 279 04/14/2014     Chemistry      Component Value Date/Time   NA 137 04/14/2014 0805   NA 142 01/31/2014 1143   K 3.3* 04/14/2014 0805   K 4.4 01/31/2014 1143   CL 104 01/31/2014 1143   CO2 22 04/14/2014 0805   CO2 24 01/31/2014 1143   BUN 24.1 04/14/2014 0805   BUN 18 01/31/2014 1143   CREATININE 0.9 04/14/2014 0805   CREATININE 0.72 01/31/2014 1143      Component Value Date/Time   CALCIUM 9.5 04/14/2014 0805   CALCIUM 9.2 01/31/2014 1143   ALKPHOS 90 04/14/2014 0805   AST 14 04/14/2014 0805   ALT 22 04/14/2014 0805   BILITOT 0.68 04/14/2014 0805     Assessment and Plan: Madeline Davidson 58 y.o. female with  1.Stage IIB ER positive, HER-2/neu positive infiltrating ductal carcinoma of the right breast. Please see prior history above.  She is cycle 4 day 8 of 6 planned treatments  with Taxotere, Carboplatin, Herceptin, and Perjeta.  Her CBC is stable, I reviewed it with her in detail.  She tolerated treatment moderately well.    2. Nausea and dehydration:  The patient is having a difficult time with her nausea during this cycle.  I reviewed her anti-emetic regimen with her today and how she should take her medications.  She will receive 1L IV NS today along with $RemoveBe'16mg'uMVWVWDRH$  Ondansetron IV in treatment room.  I also prescribed Prochlorperazine suppositories for the patient to take if she is having any further difficulty taking her meds at home.    3. Hypokalemia.  The patient has a potassium level of 3.3.  I prescribed micro K for the patient to take two tabs daily for 5 days.    4. Neuropathy: This is improved with Super B complex daily.  The patient will continue taking this.    5. Insomnia.  The patient will continue taking Ambien CR.  She is sleeping well.    6. Diarrhea: This is much improved since starting Loperamide PRN.  No further intervention is required.    The patient will return in 2 weeks for labs, evaluation, and cycle 5 of Taxotere, Carboplatin, Herceptin, and Perjeta.   She knows to call us in the interim for any questions or concerns.  We can certainly see her sooner if needed.  I spent 25 minutes counseling the patient face to face.  The total time spent in the appointment was 30 minutes.  Minette Headland, [place] [phone] 04/16/2014 3:58 PM

## 2014-04-14 NOTE — Telephone Encounter (Signed)
pt given appt for 5/26 w/dr [place]field - no other ordes. new AVS printed for pt w/all appt.

## 2014-04-14 NOTE — Patient Instructions (Signed)
Prochlorperazine suppositories What is this medicine? PROCHLORPERAZINE (proe klor PER a zeen) helps to control severe nausea and vomiting. This medicine may be used for other purposes; ask your health care provider or pharmacist if you have questions. COMMON BRAND NAME(S): Compazine, Compro What should I tell my health care provider before I take this medicine? They need to know if you have any of these conditions: -blood disorders or disease -dementia -liver disease or jaundice -Parkinson's disease -uncontrollable movement disorder -an unusual or allergic reaction to prochlorperazine, other medicines, foods, dyes, or preservatives -pregnant or trying to get pregnant -breast-feeding How should I use this medicine? This medicine is for rectal use only. Do not take by mouth. Wash your hands before and after use. Take off the foil wrapping. Wet the tip of the suppository with cold tap water to make it easier to use. Lie on your side with your lower leg straightened out and your upper leg bent forward toward your stomach. Lift upper buttock to expose the rectal area. Apply gentle pressure to insert the suppository completely into the rectum, pointed end first. Hold buttocks together for a few seconds. Remain lying down for about 15 minutes to avoid having the suppository come out. Do not use more often than directed. Talk to your pediatrician regarding the use of this medicine in children. Special care may be needed. While this medicine may be prescribed for children as young as 2 years for selected conditions, precautions do apply. Overdosage: If you think you have taken too much of this medicine contact a poison control center or emergency room at once. NOTE: This medicine is only for you. Do not share this medicine with others. What if I miss a dose? If you miss a dose, use it as soon as you can. If it is almost time for your next dose, use only that dose. Do not use double or extra doses. What  may interact with this medicine? Do not take this medicine with any of the following medications: -amoxapine -antidepressants like citalopram, escitalopram, fluoxetine,paroxetine, and sertraline -deferoxamine -dofetilide -maprotiline -tricyclic antidepressants like amitriptyline, clomipramine, imipramine, nortiptyline and others This medicine may also interact with the following medications: -lithium -medicines for pain -phenytoin -propranolol -warfarin This list may not describe all possible interactions. Give your health care provider a list of all the medicines, herbs, non-prescription drugs, or dietary supplements you use. Also tell them if you smoke, drink alcohol, or use illegal drugs. Some items may interact with your medicine. What should I watch for while using this medicine? Visit your doctor or health care professional for regular checks on your progress. You may get drowsy or dizzy. Do not drive, use machinery, or do anything that needs mental alertness until you know how this medicine affects you. Do not stand or sit up quickly, especially if you are an older patient. This reduces the risk of dizzy or fainting spells. Alcohol may interfere with the effect of this medicine. Avoid alcoholic drinks. This medicine can reduce the response of your body to heat or cold. Dress warm in cold weather and stay hydrated in hot weather. If possible, avoid extreme temperatures like saunas, hot tubs, very hot or cold showers, or activities that can cause dehydration such as vigorous exercise. This medicine can make you more sensitive to the sun. Keep out of the sun. If you cannot avoid being in the sun, wear protective clothing and use sunscreen. Do not use sun lamps or tanning beds/booths. Your mouth may get dry. Chewing  sugarless gum or sucking hard candy, and drinking plenty of water may help. Contact your doctor if the problem does not go away or is severe. What side effects may I notice from  receiving this medicine? Side effects that you should report to your doctor or health care professional as soon as possible: -blurred vision -breast enlargement in men or women -breast milk in women who are not breast-feeding -chest pain, fast or irregular heartbeat -confusion, restlessness -dark yellow or brown urine -difficulty breathing or swallowing -dizziness or fainting spells -drooling, shaking, movement difficulty (shuffling walk) or rigidity -fever, chills, sore throat -involuntary or uncontrollable movements of the eyes, mouth, head, arms, and legs -seizures -stomach area pain -unusually weak or tired -unusual bleeding or bruising -yellowing of skin or eyes Side effects that usually do not require medical attention (report to your doctor or health care professional if they continue or are bothersome): -difficulty passing urine -difficulty sleeping -headache -sexual dysfunction -skin rash, or itching This list may not describe all possible side effects. Call your doctor for medical advice about side effects. You may report side effects to FDA at 1-800-FDA-1088. Where should I keep my medicine? Keep out of the reach of children. Store at room temperature between 15 and 30 degrees C (59 and 86 degrees F). Throw away any unused medicine after the expiration date. NOTE: This sheet is a summary. It may not cover all possible information. If you have questions about this medicine, talk to your doctor, pharmacist, or health care provider.  2014, [employer]vier/[employer] Standard. (2012-04-28 16:59:06) Dehydration, Adult Dehydration is when you lose more fluids from the body than you take in. Vital organs like the kidneys, brain, and heart cannot function without a proper amount of fluids and salt. Any loss of fluids from the body can cause dehydration.  CAUSES   Vomiting.  Diarrhea.  Excessive sweating.  Excessive urine output.  Fever. SYMPTOMS  Mild dehydration  Thirst.  Dry  lips.  Slightly dry mouth. Moderate dehydration  Very dry mouth.  Sunken eyes.  Skin does not bounce back quickly when lightly pinched and released.  Dark urine and decreased urine production.  Decreased tear production.  Headache. Severe dehydration  Very dry mouth.  Extreme thirst.  Rapid, weak pulse (more than 100 beats per minute at rest).  Cold hands and feet.  Not able to sweat in spite of heat and temperature.  Rapid breathing.  Blue lips.  Confusion and lethargy.  Difficulty being awakened.  Minimal urine production.  No tears. DIAGNOSIS  Your caregiver will diagnose dehydration based on your symptoms and your exam. Blood and urine tests will help confirm the diagnosis. The diagnostic evaluation should also identify the cause of dehydration. TREATMENT  Treatment of mild or moderate dehydration can often be done at home by increasing the amount of fluids that you drink. It is best to drink small amounts of fluid more often. Drinking too much at one time can make vomiting worse. Refer to the home care instructions below. Severe dehydration needs to be treated at the hospital where you will probably be given intravenous (IV) fluids that contain water and electrolytes. HOME CARE INSTRUCTIONS   Ask your caregiver about specific rehydration instructions.  Drink enough fluids to keep your urine clear or pale yellow.  Drink small amounts frequently if you have nausea and vomiting.  Eat as you normally do.  Avoid:  Foods or drinks high in sugar.  Carbonated drinks.  Juice.  Extremely hot or cold fluids.  Drinks with caffeine.  Fatty, greasy foods.  Alcohol.  Tobacco.  Overeating.  Gelatin desserts.  Wash your hands well to avoid spreading bacteria and viruses.  Only take over-the-counter or prescription medicines for pain, discomfort, or fever as directed by your caregiver.  Ask your caregiver if you should continue all prescribed and  over-the-counter medicines.  Keep all follow-up appointments with your caregiver. SEEK MEDICAL CARE IF:  You have abdominal pain and it increases or stays in one area (localizes).  You have a rash, stiff neck, or severe headache.  You are irritable, sleepy, or difficult to awaken.  You are weak, dizzy, or extremely thirsty. SEEK IMMEDIATE MEDICAL CARE IF:   You are unable to keep fluids down or you get worse despite treatment.  You have frequent episodes of vomiting or diarrhea.  You have blood or green matter (bile) in your vomit.  You have blood in your stool or your stool looks black and tarry.  You have not urinated in 6 to 8 hours, or you have only urinated a small amount of very dark urine.  You have a fever.  You faint. MAKE SURE YOU:   Understand these instructions.  Will watch your condition.  Will get help right away if you are not doing well or get worse. Document Released: 12/09/2005 Document Revised: 03/02/2012 Document Reviewed: 07/29/2011 [facility] Patient Information 2014 [place], [place]. Diarrhea Diarrhea is frequent loose and watery bowel movements. It can cause you to feel weak and dehydrated. Dehydration can cause you to become tired and thirsty, have a dry mouth, and have decreased urination that often is dark yellow. Diarrhea is a sign of another problem, most often an infection that will not last long. In most cases, diarrhea typically lasts 2 3 days. However, it can last longer if it is a sign of something more serious. It is important to treat your diarrhea as directed by your caregive to lessen or prevent future episodes of diarrhea. CAUSES  Some common causes include:  Gastrointestinal infections caused by viruses, bacteria, or parasites.  Food poisoning or food allergies.  Certain medicines, such as antibiotics, chemotherapy, and laxatives.  Artificial sweeteners and fructose.  Digestive disorders. HOME CARE INSTRUCTIONS  Ensure adequate  fluid intake (hydration): have 1 cup (8 oz) of fluid for each diarrhea episode. Avoid fluids that contain simple sugars or sports drinks, fruit juices, whole milk products, and sodas. Your urine should be clear or pale yellow if you are drinking enough fluids. Hydrate with an oral rehydration solution that you can purchase at pharmacies, retail stores, and online. You can prepare an oral rehydration solution at home by mixing the following ingredients together:    tsp table salt.   tsp baking soda.   tsp salt substitute containing potassium chloride.  1  tablespoons sugar.  1 L (34 oz) of water.  Certain foods and beverages may increase the speed at which food moves through the gastrointestinal (GI) tract. These foods and beverages should be avoided and include:  Caffeinated and alcoholic beverages.  High-fiber foods, such as raw fruits and vegetables, nuts, seeds, and whole grain breads and cereals.  Foods and beverages sweetened with sugar alcohols, such as xylitol, sorbitol, and mannitol.  Some foods may be well tolerated and may help thicken stool including:  Starchy foods, such as rice, toast, pasta, low-sugar cereal, oatmeal, grits, baked potatoes, crackers, and bagels.  Bananas.  Applesauce.  Add probiotic-rich foods to help increase healthy bacteria in the GI tract, such as  yogurt and fermented milk products.  Wash your hands well after each diarrhea episode.  Only take over-the-counter or prescription medicines as directed by your caregiver.  Take a warm bath to relieve any burning or pain from frequent diarrhea episodes. SEEK IMMEDIATE MEDICAL CARE IF:   You are unable to keep fluids down.  You have persistent vomiting.  You have blood in your stool, or your stools are black and tarry.  You do not urinate in 6 8 hours, or there is only a small amount of very dark urine.  You have abdominal pain that increases or localizes.  You have weakness, dizziness,  confusion, or lightheadedness.  You have a severe headache.  Your diarrhea gets worse or does not get better.  You have a fever or persistent symptoms for more than 2 3 days.  You have a fever and your symptoms suddenly get worse. MAKE SURE YOU:   Understand these instructions.  Will watch your condition.  Will get help right away if you are not doing well or get worse. Document Released: 11/29/2002 Document Revised: 11/25/2012 Document Reviewed: 08/16/2012 [facility] Patient Information 2014 [place], [place].

## 2014-04-14 NOTE — Patient Instructions (Signed)
Dehydration, Adult Dehydration is when you lose more fluids from the body than you take in. Vital organs like the kidneys, brain, and heart cannot function without a proper amount of fluids and salt. Any loss of fluids from the body can cause dehydration.  CAUSES   Vomiting.  Diarrhea.  Excessive sweating.  Excessive urine output.  Fever. SYMPTOMS  Mild dehydration  Thirst.  Dry lips.  Slightly dry mouth. Moderate dehydration  Very dry mouth.  Sunken eyes.  Skin does not bounce back quickly when lightly pinched and released.  Dark urine and decreased urine production.  Decreased tear production.  Headache. Severe dehydration  Very dry mouth.  Extreme thirst.  Rapid, weak pulse (more than 100 beats per minute at rest).  Cold hands and feet.  Not able to sweat in spite of heat and temperature.  Rapid breathing.  Blue lips.  Confusion and lethargy.  Difficulty being awakened.  Minimal urine production.  No tears. DIAGNOSIS  Your caregiver will diagnose dehydration based on your symptoms and your exam. Blood and urine tests will help confirm the diagnosis. The diagnostic evaluation should also identify the cause of dehydration. TREATMENT  Treatment of mild or moderate dehydration can often be done at home by increasing the amount of fluids that you drink. It is best to drink small amounts of fluid more often. Drinking too much at one time can make vomiting worse. Refer to the home care instructions below. Severe dehydration needs to be treated at the hospital where you will probably be given intravenous (IV) fluids that contain water and electrolytes. HOME CARE INSTRUCTIONS   Ask your caregiver about specific rehydration instructions.  Drink enough fluids to keep your urine clear or pale yellow.  Drink small amounts frequently if you have nausea and vomiting.  Eat as you normally do.  Avoid:  Foods or drinks high in sugar.  Carbonated  drinks.  Juice.  Extremely hot or cold fluids.  Drinks with caffeine.  Fatty, greasy foods.  Alcohol.  Tobacco.  Overeating.  Gelatin desserts.  Wash your hands well to avoid spreading bacteria and viruses.  Only take over-the-counter or prescription medicines for pain, discomfort, or fever as directed by your caregiver.  Ask your caregiver if you should continue all prescribed and over-the-counter medicines.  Keep all follow-up appointments with your caregiver. SEEK MEDICAL CARE IF:  You have abdominal pain and it increases or stays in one area (localizes).  You have a rash, stiff neck, or severe headache.  You are irritable, sleepy, or difficult to awaken.  You are weak, dizzy, or extremely thirsty. SEEK IMMEDIATE MEDICAL CARE IF:   You are unable to keep fluids down or you get worse despite treatment.  You have frequent episodes of vomiting or diarrhea.  You have blood or green matter (bile) in your vomit.  You have blood in your stool or your stool looks black and tarry.  You have not urinated in 6 to 8 hours, or you have only urinated a small amount of very dark urine.  You have a fever.  You faint. MAKE SURE YOU:   Understand these instructions.  Will watch your condition.  Will get help right away if you are not doing well or get worse. Document Released: 12/09/2005 Document Revised: 03/02/2012 Document Reviewed: 07/29/2011 Exit[employer] Patient Information 2014 [employer].  

## 2014-04-28 ENCOUNTER — Other Ambulatory Visit: Payer: Self-pay | Admitting: Oncology

## 2014-04-28 ENCOUNTER — Telehealth: Payer: Self-pay | Admitting: Adult Health

## 2014-04-28 ENCOUNTER — Ambulatory Visit (HOSPITAL_BASED_OUTPATIENT_CLINIC_OR_DEPARTMENT_OTHER): Payer: BC Managed Care – PPO

## 2014-04-28 ENCOUNTER — Encounter: Payer: Self-pay | Admitting: Adult Health

## 2014-04-28 ENCOUNTER — Ambulatory Visit (HOSPITAL_BASED_OUTPATIENT_CLINIC_OR_DEPARTMENT_OTHER): Payer: BC Managed Care – PPO | Admitting: Adult Health

## 2014-04-28 ENCOUNTER — Other Ambulatory Visit (HOSPITAL_BASED_OUTPATIENT_CLINIC_OR_DEPARTMENT_OTHER): Payer: BC Managed Care – PPO

## 2014-04-28 ENCOUNTER — Telehealth: Payer: Self-pay | Admitting: Oncology

## 2014-04-28 VITALS — BP 120/71 | HR 97 | Temp 98.3°F | Resp 18 | Ht 64.0 in | Wt 194.3 lb

## 2014-04-28 DIAGNOSIS — C50519 Malignant neoplasm of lower-outer quadrant of unspecified female breast: Secondary | ICD-10-CM

## 2014-04-28 DIAGNOSIS — G609 Hereditary and idiopathic neuropathy, unspecified: Secondary | ICD-10-CM

## 2014-04-28 DIAGNOSIS — E876 Hypokalemia: Secondary | ICD-10-CM

## 2014-04-28 DIAGNOSIS — G4700 Insomnia, unspecified: Secondary | ICD-10-CM

## 2014-04-28 DIAGNOSIS — Z5112 Encounter for antineoplastic immunotherapy: Secondary | ICD-10-CM

## 2014-04-28 DIAGNOSIS — C50919 Malignant neoplasm of unspecified site of unspecified female breast: Secondary | ICD-10-CM

## 2014-04-28 DIAGNOSIS — Z5111 Encounter for antineoplastic chemotherapy: Secondary | ICD-10-CM

## 2014-04-28 DIAGNOSIS — Z170 Estrogen receptor positive status [ER+]: Secondary | ICD-10-CM

## 2014-04-28 DIAGNOSIS — R197 Diarrhea, unspecified: Secondary | ICD-10-CM

## 2014-04-28 LAB — COMPREHENSIVE METABOLIC PANEL (CC13)
ALBUMIN: 3.4 g/dL — AB (ref 3.5–5.0)
ALT: 16 U/L (ref 0–55)
AST: 13 U/L (ref 5–34)
Alkaline Phosphatase: 82 U/L (ref 40–150)
Anion Gap: 14 mEq/L — ABNORMAL HIGH (ref 3–11)
BUN: 23.9 mg/dL (ref 7.0–26.0)
CALCIUM: 9.8 mg/dL (ref 8.4–10.4)
CHLORIDE: 105 meq/L (ref 98–109)
CO2: 26 mEq/L (ref 22–29)
CREATININE: 1 mg/dL (ref 0.6–1.1)
Glucose: 127 mg/dl (ref 70–140)
POTASSIUM: 2.8 meq/L — AB (ref 3.5–5.1)
Sodium: 144 mEq/L (ref 136–145)
Total Bilirubin: 0.3 mg/dL (ref 0.20–1.20)
Total Protein: 6.3 g/dL — ABNORMAL LOW (ref 6.4–8.3)

## 2014-04-28 LAB — CBC WITH DIFFERENTIAL/PLATELET
BASO%: 0.1 % (ref 0.0–2.0)
Basophils Absolute: 0 10*3/uL (ref 0.0–0.1)
EOS%: 0 % (ref 0.0–7.0)
Eosinophils Absolute: 0 10*3/uL (ref 0.0–0.5)
HCT: 25.8 % — ABNORMAL LOW (ref 34.8–46.6)
HGB: 8.8 g/dL — ABNORMAL LOW (ref 11.6–15.9)
LYMPH#: 1 10*3/uL (ref 0.9–3.3)
LYMPH%: 7.1 % — ABNORMAL LOW (ref 14.0–49.7)
MCH: 32.2 pg (ref 25.1–34.0)
MCHC: 34.1 g/dL (ref 31.5–36.0)
MCV: 94.4 fL (ref 79.5–101.0)
MONO#: 0.8 10*3/uL (ref 0.1–0.9)
MONO%: 5.8 % (ref 0.0–14.0)
NEUT#: 12 10*3/uL — ABNORMAL HIGH (ref 1.5–6.5)
NEUT%: 87 % — ABNORMAL HIGH (ref 38.4–76.8)
Platelets: 222 10*3/uL (ref 145–400)
RBC: 2.74 10*6/uL — ABNORMAL LOW (ref 3.70–5.45)
RDW: 22.5 % — AB (ref 11.2–14.5)
WBC: 13.8 10*3/uL — ABNORMAL HIGH (ref 3.9–10.3)

## 2014-04-28 MED ORDER — SODIUM CHLORIDE 0.9 % IJ SOLN
10.0000 mL | INTRAMUSCULAR | Status: DC | PRN
  Administered 2014-04-28: 10 mL
  Filled 2014-04-28: qty 10

## 2014-04-28 MED ORDER — POTASSIUM CHLORIDE 20 MEQ PO PACK: 20.0000 meq | PACK | Freq: Two times a day (BID) | ORAL | Status: DC

## 2014-04-28 MED ORDER — HEPARIN SOD (PORK) LOCK FLUSH 100 UNIT/ML IV SOLN
500.0000 [IU] | Freq: Once | INTRAVENOUS | Status: AC | PRN
  Administered 2014-04-28: 500 [IU]
  Filled 2014-04-28: qty 5

## 2014-04-28 MED ORDER — TRASTUZUMAB CHEMO INJECTION 440 MG
6.0000 mg/kg | Freq: Once | INTRAVENOUS | Status: AC
  Administered 2014-04-28: 567 mg via INTRAVENOUS
  Filled 2014-04-28: qty 27

## 2014-04-28 MED ORDER — ONDANSETRON 16 MG/50ML IVPB (CHCC)
16.0000 mg | Freq: Once | INTRAVENOUS | Status: AC
  Administered 2014-04-28: 16 mg via INTRAVENOUS

## 2014-04-28 MED ORDER — SODIUM CHLORIDE 0.9 % IV SOLN
420.0000 mg | Freq: Once | INTRAVENOUS | Status: AC
  Administered 2014-04-28: 420 mg via INTRAVENOUS
  Filled 2014-04-28: qty 14

## 2014-04-28 MED ORDER — SODIUM CHLORIDE 0.9 % IV SOLN
Freq: Once | INTRAVENOUS | Status: AC
  Administered 2014-04-28: 15:00:00 via INTRAVENOUS
  Filled 2014-04-28: qty 1000

## 2014-04-28 MED ORDER — DEXAMETHASONE SODIUM PHOSPHATE 20 MG/5ML IJ SOLN
INTRAMUSCULAR | Status: AC
  Filled 2014-04-28: qty 5

## 2014-04-28 MED ORDER — ACETAMINOPHEN 325 MG PO TABS
650.0000 mg | ORAL_TABLET | Freq: Once | ORAL | Status: AC
  Administered 2014-04-28: 650 mg via ORAL

## 2014-04-28 MED ORDER — DOCETAXEL CHEMO INJECTION 160 MG/16ML
75.0000 mg/m2 | Freq: Once | INTRAVENOUS | Status: AC
  Administered 2014-04-28: 150 mg via INTRAVENOUS
  Filled 2014-04-28: qty 15

## 2014-04-28 MED ORDER — SODIUM CHLORIDE 0.9 % IV SOLN
700.0000 mg | Freq: Once | INTRAVENOUS | Status: AC
  Administered 2014-04-28: 700 mg via INTRAVENOUS
  Filled 2014-04-28: qty 70

## 2014-04-28 MED ORDER — DIPHENHYDRAMINE HCL 25 MG PO CAPS
ORAL_CAPSULE | ORAL | Status: AC
  Filled 2014-04-28: qty 2

## 2014-04-28 MED ORDER — ONDANSETRON 16 MG/50ML IVPB (CHCC)
INTRAVENOUS | Status: AC
  Filled 2014-04-28: qty 16

## 2014-04-28 MED ORDER — CHOLESTYRAMINE 4 G PO PACK: 4.0000 g | PACK | Freq: Two times a day (BID) | ORAL | Status: DC

## 2014-04-28 MED ORDER — DIPHENHYDRAMINE HCL 25 MG PO CAPS
50.0000 mg | ORAL_CAPSULE | Freq: Once | ORAL | Status: AC
  Administered 2014-04-28: 50 mg via ORAL

## 2014-04-28 MED ORDER — ACETAMINOPHEN 325 MG PO TABS
ORAL_TABLET | ORAL | Status: AC
  Filled 2014-04-28: qty 2

## 2014-04-28 MED ORDER — DEXAMETHASONE SODIUM PHOSPHATE 20 MG/5ML IJ SOLN
20.0000 mg | Freq: Once | INTRAMUSCULAR | Status: AC
  Administered 2014-04-28: 20 mg via INTRAVENOUS

## 2014-04-28 MED ORDER — SODIUM CHLORIDE 0.9 % IV SOLN
Freq: Once | INTRAVENOUS | Status: AC
  Administered 2014-04-28: 11:00:00 via INTRAVENOUS

## 2014-04-28 NOTE — Patient Instructions (Signed)
Cholestyramine powder for oral suspension  What is this medicine?  CHOLESTYRAMINE (koe LESS tir a meen) is used to lower cholesterol in patients who are at risk of heart disease or stroke. This medicine is only for patients whose cholesterol level is not controlled by diet.  This medicine may be used for other purposes; ask your health care provider or pharmacist if you have questions.  COMMON BRAND NAME(S): Locholest , Locholest Light, Prevalite , Questran Light, Questran  What should I tell my health care provider before I take this medicine?  They need to know if you have any of these conditions:  -blocked bile duct  -an unusual or allergic reaction to cholestyramine, other medicines, foods, dyes, or preservatives  -pregnant or trying to get pregnant  -breast-feeding  How should I use this medicine?  Do not take this medicine in the dry form. It must be mixed with a liquid before swallowing. Follow the directions on the prescription label. Place the powder in a glass or cup. Add between 2 and 6 ounces of fluid. This can be water, milk, pulpy fruit juice, fluid soup, or other liquid. Mix well and drink all of the liquid. Take your doses at regular intervals. Do not take your medicine more often than directed.  Talk to your pediatrician regarding the use of this medicine in children. Special care may be needed.  Overdosage: If you think you have taken too much of this medicine contact a poison control center or emergency room at once.  NOTE: This medicine is only for you. Do not share this medicine with others.  What if I miss a dose?  If you miss a dose, take it as soon as you can. If it is almost time for your next dose, take only that dose. Do not take double or extra doses.  What may interact with this medicine?  -diuretics  -female hormones, like estrogens or progestins and birth control pills  -heart medicines such as digoxin or digitoxin  -penicillin  G  -phenobarbital  -phenylbutazone  -phytonadione  -propranolol  -tetracycline antibiotics  -thyroid hormones  -vitamin A  -vitamin D  -vitamin E  -warfarin  Take other drugs at least 1 hour before or 4 hours after this medicine, to avoid decreasing their absorption.  This list may not describe all possible interactions. Give your health care provider a list of all the medicines, herbs, non-prescription drugs, or dietary supplements you use. Also tell them if you smoke, drink alcohol, or use illegal drugs. Some items may interact with your medicine.  What should I watch for while using this medicine?  Visit your doctor or health care professional for regular checks on your progress. Your blood fats and other tests will be measured from time to time.  This medicine is only part of a total cholesterol-lowering program. Your health care professional or dietician can suggest a low-cholesterol and low-fat diet that will reduce your risk of getting heart and blood vessel disease. Avoid alcohol and smoking, and keep a proper exercise schedule.  To reduce the chance of getting constipated, drink plenty of water and increase the amount of fiber in your diet. Ask your doctor or health care professional for advice if you are constipated.  What side effects may I notice from receiving this medicine?  Side effects that you should report to your doctor or health care professional as soon as possible:  -allergic reactions like skin rash, itching or hives, swelling of the face, lips, or tongue  -  bloody or black, tarry stools  -severe stomach pain with nausea and vomiting  -unusual bleeding  Side effects that usually do not require medical attention (report to your doctor or health care professional if they continue or are bothersome):  -constipation or diarrhea  -dizziness  -headache  -heartburn, indigestion  -nausea, vomiting  -perianal irritation  This list may not describe all possible side effects. Call your doctor for medical  advice about side effects. You may report side effects to FDA at 1-800-FDA-1088.  Where should I keep my medicine?  Keep out of the reach of children.  Store at room temperature between 15 and 30 degrees C (59 and 86 degrees F). Throw away any unused medicine after the expiration date.  NOTE: This sheet is a summary. It may not cover all possible information. If you have questions about this medicine, talk to your doctor, pharmacist, or health care provider.  © 2014, [employer]. (2008-03-15 15:33:42)

## 2014-04-28 NOTE — Patient Instructions (Addendum)
[facility] Discharge Instructions for Patients Receiving Chemotherapy  Today you received the following chemotherapy agents Taxotere/Carboplatin/Herceptin/Perjeta To help prevent nausea and vomiting after your treatment, we encourage you to take your nausea medication as prescribed.   If you develop nausea and vomiting that is not controlled by your nausea medication, call the clinic.   BELOW ARE SYMPTOMS THAT SHOULD BE REPORTED IMMEDIATELY:  *FEVER GREATER THAN 100.5 F  *CHILLS WITH OR WITHOUT FEVER  NAUSEA AND VOMITING THAT IS NOT CONTROLLED WITH YOUR NAUSEA MEDICATION  *UNUSUAL SHORTNESS OF BREATH  *UNUSUAL BRUISING OR BLEEDING  TENDERNESS IN MOUTH AND THROAT WITH OR WITHOUT PRESENCE OF ULCERS  *URINARY PROBLEMS  *BOWEL PROBLEMS  UNUSUAL RASH Items with * indicate a potential emergency and should be followed up as soon as possible.  Feel free to call the clinic you have any questions or concerns. The clinic phone number is [phone].     Hypokalemia Hypokalemia means that the amount of potassium in the blood is lower than normal.Potassium is a chemical, called an electrolyte, that helps regulate the amount of fluid in the body. It also stimulates muscle contraction and helps nerves function properly.Most of the body's potassium is inside of cells, and only a very small amount is in the blood. Because the amount in the blood is so small, minor changes can be life-threatening. CAUSES  Antibiotics.  Diarrhea or vomiting.  Using laxatives too much, which can cause diarrhea.  Chronic kidney disease.  Water pills (diuretics).  Eating disorders (bulimia).  Low magnesium level.  Sweating a lot. SIGNS AND SYMPTOMS  Weakness.  Constipation.  Fatigue.  Muscle cramps.  Mental confusion.  Skipped heartbeats or irregular heartbeat (palpitations).  Tingling or numbness. DIAGNOSIS  Your health care provider can diagnose hypokalemia with  blood tests. In addition to checking your potassium level, your health care provider may also check other lab tests. TREATMENT Hypokalemia can be treated with potassium supplements taken by mouth or adjustments in your current medicines. If your potassium level is very low, you may need to get potassium through a vein (IV) and be monitored in the hospital. A diet high in potassium is also helpful. Foods high in potassium are:  Nuts, such as peanuts and pistachios.  Seeds, such as sunflower seeds and pumpkin seeds.  Peas, lentils, and lima beans.  Whole grain and bran cereals and breads.  Fresh fruit and vegetables, such as apricots, avocado, bananas, cantaloupe, kiwi, oranges, tomatoes, asparagus, and potatoes.  Orange and tomato juices.  Red meats.  Fruit yogurt. HOME CARE INSTRUCTIONS  Take all medicines as prescribed by your health care provider.  Maintain a healthy diet by including nutritious food, such as fruits, vegetables, nuts, whole grains, and lean meats.  If you are taking a laxative, be sure to follow the directions on the label. SEEK MEDICAL CARE IF:  Your weakness gets worse.  You feel your heart pounding or racing.  You are vomiting or having diarrhea.  You are diabetic and having trouble keeping your blood glucose in the normal range. SEEK IMMEDIATE MEDICAL CARE IF:  You have chest pain, shortness of breath, or dizziness.  You are vomiting or having diarrhea for more than 2 days.  You faint. MAKE SURE YOU:   Understand these instructions.  Will watch your condition.  Will get help right away if you are not doing well or get worse. Document Released: 12/09/2005 Document Revised: 09/29/2013 Document Reviewed: 06/11/2013 [facility] Patient Information 2014 [facility].

## 2014-04-28 NOTE — Telephone Encounter (Signed)
added pt on for tx 5.8.15...helen advise i just add...done...Anne M to call pt °

## 2014-04-28 NOTE — Progress Notes (Signed)
Hematology and Oncology Follow Up Visit  Madeline Davidson [id number] 07-08-1956 58 y.o. 04/28/2014 10:29 AM     Principle Diagnosis:Madeline Davidson 58 y.o. 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   Prior Therapy:  #1 Patient on 12/16/2013 felt a lump in her right breast. She states it was nickel size. Because of this she 1 on to see her nurse practitioner. On 12/31/2013 a mammogram was ordered. This showed a lesion in the 7:00 position measuring 3.3 cm. She was also noted to have enlarged lymph nodes. Subsequent ultrasound showed a 2.2 x 2.4 cm mass with enlarged lymph node measuring 1.6 cm. This was biopsied in [place]. 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 Lymph node was positive for metastatic disease. Prognostic markers revealed the tumor to be estrogen receptor +90% progesterone receptor 1% HER-2/neu positive   #2 staging PET/CT performed on 19/62/2297:  Hypermetabolic mass posterior right breast with SUV max 13.6 and  measuring 13.2 cm (image 108). There two 12 in 16 mm hypermetabolic  nodules in the posterior right breast versus lymph nodes with  intense metabolic activity SUV max 10.9. No hypermetabolic  supraclavicular internal mammary nodes. No hypermetabolic  mediastinal lymph nodes. No suspicious pulmonary nodules.  ABDOMEN/PELVIS  There are 3 hypermetabolic periportal lymph nodes. The largest  measures 20 mm with SUV max 13.6. Additional small less than 10 mm  short axis nodes on image 144 and 130 also have intense metabolic  activity. No abnormal metabolic activity within the liver. No  hypermetabolic abdominal pelvic lymph nodes.   Current therapy:  Taxotere, Carboplatin, Herceptin, Perjeta cycle 5 day 1  Interim History: Madeline Davidson 58 y.o. 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  She is here to be evaluated  prior to receiving cycle 5 of 6 planned treatments of Taxotere, Carboplatin, Herceptin, and Perjeta.  She is receiving this treatment in the neaodjuvant setting on day 1 of a 21 day cycle with Neulasta support on day 2.    The patient is here on cycle 5 day 1 of treatment.  She is doing moderately well today.  She is fatigued.  She was prescribed micro K and she couldn't take it.  She did not call our office to tell us this.  Today her potassium is 2.8. She has mild numbness in her right fingertips and intermittently in her toes.  It is not affecting her activities of daily living.  She is taking Super B complex daily and tolerating it well. She is otherwise doing well and denies fevers, chills, nausea, vomiting, constipation, diarrhea,  chest pain, palpitations, DOE, orthopnea, or any further concerns.    Medications:  Current Outpatient Prescriptions  Medication Sig Dispense Refill  . B Complex-C (SUPER B COMPLEX PO) Take 1 tablet by mouth every morning.      . cetirizine (ZYRTEC) 10 MG tablet Take 10 mg by mouth daily.      Marland Kitchen dexamethasone (DECADRON) 4 MG tablet Take 2 tablets (8 mg total) by mouth 2 (two) times daily with a meal. Take two times a day the day before Taxotere. Then take two times a day starting the day after chemo for 3 days.  30 tablet  1  . lisinopril-hydrochlorothiazide (PRINZIDE,ZESTORETIC) 10-12.5 MG per tablet Take 1 tablet by mouth daily.      Marland Kitchen zolpidem (AMBIEN CR) 12.5 MG CR tablet Take 1 tablet (12.5 mg total) by mouth at  bedtime as needed for sleep.  30 tablet  0  . clonazePAM (KLONOPIN) 0.5 MG tablet Take 0.5 mg by mouth as needed for anxiety (take half a tablet as needed).      Marland Kitchen ibuprofen (ADVIL,MOTRIN) 800 MG tablet Take 800 mg by mouth as needed.      . lidocaine-prilocaine (EMLA) cream Apply topically as needed.  30 g  6  . LORazepam (ATIVAN) 0.5 MG tablet Take 1 tablet (0.5 mg total) by mouth every 6 (six) hours as needed (Nausea or vomiting).  30 tablet  0  .  ondansetron (ZOFRAN) 8 MG tablet Take 1 tablet (8 mg total) by mouth 2 (two) times daily. Take two times a day starting the day after chemo for 3 days. Then take two times a day as needed for nausea or vomiting.  30 tablet  1  . Potassium Chloride CR (MICRO-K) 8 MEQ CPCR capsule CR Take 2 capsules (16 mEq total) by mouth daily.  10 capsule  0  . prochlorperazine (COMPAZINE) 10 MG tablet Take 1 tablet (10 mg total) by mouth every 6 (six) hours as needed (Nausea or vomiting).  30 tablet  1  . prochlorperazine (COMPAZINE) 25 MG suppository Place 1 suppository (25 mg total) rectally every 12 (twelve) hours as needed for nausea or vomiting.  12 suppository  0  . zolpidem (AMBIEN) 10 MG tablet Take 10 mg by mouth at bedtime as needed for sleep (take half a tablet at night).       No current facility-administered medications for this visit.   Facility-Administered Medications Ordered in Other Visits  Medication Dose Route Frequency Provider Last Rate Last Dose  . sodium chloride 0.9 % injection 10 mL  10 mL Intracatheter PRN [name], MD   10 mL at 02/03/14 1759     Allergies:  Allergies  Allergen Reactions  . Ciprofloxacin Other (See Comments)    Patient had a headache after taking Cipro. Made her sinus infection symptoms worse.    Medical History: Past Medical History  Diagnosis Date  . 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 Hypertension   . Allergy   . Hypertension 01/10/2014  . Wears glasses     Surgical History:  Past Surgical History  Procedure Laterality Date  . Colon resection  2004  . Hernia repair  2005    umb  . Cholecystectomy  2005  . Portacath placement Left 01/31/2014    Procedure: INSERTION PORT-A-CATH;  Surgeon: Rolm Bookbinder, MD;  Location: [facility];  Service: General;  Laterality: Left;     Review of Systems: A 10 point review of systems was conducted and is otherwise negative except for what is noted  above.    Physical Exam: Blood pressure 120/71, pulse 97, temperature 98.3 F (36.8 C), temperature source Oral, resp. rate 18, height $RemoveBe'5\' 4"'kRfAVBsGt$  (1.626 m), weight 194 lb 4.8 oz (88.134 kg). GENERAL: Patient is a well appearing female in no acute distress HEENT:  Sclerae anicteric.  Oropharynx clear and moist. No ulcerations or evidence of oropharyngeal candidiasis. Neck is supple.  NODES:  No cervical, supraclavicular, or axillary lymphadenopathy palpated.  BREAST EXAM:  Deferred. LUNGS:  Clear to auscultation bilaterally.  No wheezes or rhonchi. HEART:  Regular rate and rhythm. No murmur appreciated. ABDOMEN:  Soft, nontender.  Positive, normoactive bowel sounds. No organomegaly palpated. MSK:  No focal spinal tenderness to palpation. Full range of motion bilaterally in the  upper extremities. EXTREMITIES:  No peripheral edema.   SKIN:  Clear with no obvious rashes or skin changes. No nail dyscrasia. NEURO:  Nonfocal. Well oriented.  Appropriate affect. ECOG PERFORMANCE STATUS: 1 - Symptomatic but completely ambulatory  Lab Results: Lab Results  Component Value Date   WBC 13.8* 04/28/2014   HGB 8.8* 04/28/2014   HCT 25.8* 04/28/2014   MCV 94.4 04/28/2014   PLT 222 04/28/2014     Chemistry      Component Value Date/Time   NA 144 04/28/2014 0923   NA 142 01/31/2014 1143   K 2.8* 04/28/2014 0923   K 4.4 01/31/2014 1143   CL 104 01/31/2014 1143   CO2 26 04/28/2014 0923   CO2 24 01/31/2014 1143   BUN 23.9 04/28/2014 0923   BUN 18 01/31/2014 1143   CREATININE 1.0 04/28/2014 0923   CREATININE 0.72 01/31/2014 1143      Component Value Date/Time   CALCIUM 9.8 04/28/2014 0923   CALCIUM 9.2 01/31/2014 1143   ALKPHOS 82 04/28/2014 0923   AST 13 04/28/2014 0923   ALT 16 04/28/2014 0923   BILITOT 0.30 04/28/2014 0923     Assessment and Plan: Madeline Davidson 58 y.o. female with  1.Stage IIB ER positive, HER-2/neu positive infiltrating ductal carcinoma of the right breast. Please see prior history above.  She is cycle 5 day  1 of 6 planned treatments with Taxotere, Carboplatin, Herceptin, and Perjeta. Her labs are stable.  She will proceed with treatment today.    2.  Hypokalemia.  The patient has a potassium level of 2.8.  She cannot swallow the micro k.  I have ordered her to receive IV potassium today of 66meq.  She will return tomorrow for a BMET and more IV potassium if needed.  I prescribed liquid potassium of 61meq PO BID as well.   3. Neuropathy: This is improved with Super B complex daily.  The patient will continue taking this.    4. Insomnia.  The patient will continue taking Ambien CR.  She is sleeping well.    5. Diarrhea: This is what caused the hypokalemia in the first place.  It has stopped, however I have prescribed Questran for her to take BID should it occur again.    The patient will return in   She knows to call us in the interim for any questions or concerns.  We can certainly see her sooner if needed.  I spent 25 minutes counseling the patient face to face.  The total time spent in the appointment was 30 minutes.  Minette Headland, [place] [phone] 04/28/2014 10:29 AM

## 2014-04-28 NOTE — Telephone Encounter (Signed)
, °

## 2014-04-29 ENCOUNTER — Ambulatory Visit: Payer: BC Managed Care – PPO

## 2014-04-29 ENCOUNTER — Other Ambulatory Visit: Payer: BC Managed Care – PPO

## 2014-04-29 ENCOUNTER — Other Ambulatory Visit: Payer: Self-pay | Admitting: Adult Health

## 2014-04-29 ENCOUNTER — Other Ambulatory Visit: Payer: Self-pay | Admitting: Medical Oncology

## 2014-04-29 ENCOUNTER — Ambulatory Visit (HOSPITAL_BASED_OUTPATIENT_CLINIC_OR_DEPARTMENT_OTHER): Payer: BC Managed Care – PPO

## 2014-04-29 ENCOUNTER — Other Ambulatory Visit: Payer: Self-pay | Admitting: *Deleted

## 2014-04-29 VITALS — BP 119/51 | HR 78 | Temp 97.9°F

## 2014-04-29 DIAGNOSIS — E876 Hypokalemia: Secondary | ICD-10-CM

## 2014-04-29 DIAGNOSIS — Z5189 Encounter for other specified aftercare: Secondary | ICD-10-CM

## 2014-04-29 DIAGNOSIS — C50919 Malignant neoplasm of unspecified site of unspecified female breast: Secondary | ICD-10-CM

## 2014-04-29 DIAGNOSIS — C50519 Malignant neoplasm of lower-outer quadrant of unspecified female breast: Secondary | ICD-10-CM

## 2014-04-29 LAB — POTASSIUM (CC13): Potassium: 2.9 mEq/L — CL (ref 3.5–5.1)

## 2014-04-29 MED ORDER — PEGFILGRASTIM INJECTION 6 MG/0.6ML
6.0000 mg | Freq: Once | SUBCUTANEOUS | Status: AC
  Administered 2014-04-29: 6 mg via SUBCUTANEOUS
  Filled 2014-04-29: qty 0.6

## 2014-04-29 MED ORDER — SODIUM CHLORIDE 0.9 % IV SOLN
Freq: Once | INTRAVENOUS | Status: DC
  Administered 2014-04-29: 10:00:00 via INTRAVENOUS
  Filled 2014-04-29: qty 1000

## 2014-04-29 MED ORDER — SODIUM CHLORIDE 0.9 % IJ SOLN
10.0000 mL | INTRAMUSCULAR | Status: DC | PRN
  Administered 2014-04-29: 10 mL via INTRAVENOUS
  Filled 2014-04-29: qty 10

## 2014-04-29 MED ORDER — HEPARIN SOD (PORK) LOCK FLUSH 100 UNIT/ML IV SOLN
500.0000 [IU] | Freq: Once | INTRAVENOUS | Status: AC
  Administered 2014-04-29: 500 [IU] via INTRAVENOUS
  Filled 2014-04-29: qty 5

## 2014-04-29 NOTE — Patient Instructions (Signed)
Hypokalemia Hypokalemia means that the amount of potassium in the blood is lower than normal.Potassium is a chemical, called an electrolyte, that helps regulate the amount of fluid in the body. It also stimulates muscle contraction and helps nerves function properly.Most of the body's potassium is inside of cells, and only a very small amount is in the blood. Because the amount in the blood is so small, minor changes can be life-threatening. CAUSES  Antibiotics.  Diarrhea or vomiting.  Using laxatives too much, which can cause diarrhea.  Chronic kidney disease.  Water pills (diuretics).  Eating disorders (bulimia).  Low magnesium level.  Sweating a lot. SIGNS AND SYMPTOMS  Weakness.  Constipation.  Fatigue.  Muscle cramps.  Mental confusion.  Skipped heartbeats or irregular heartbeat (palpitations).  Tingling or numbness. DIAGNOSIS  Your health care provider can diagnose hypokalemia with blood tests. In addition to checking your potassium level, your health care provider may also check other lab tests. TREATMENT Hypokalemia can be treated with potassium supplements taken by mouth or adjustments in your current medicines. If your potassium level is very low, you may need to get potassium through a vein (IV) and be monitored in the hospital. A diet high in potassium is also helpful. Foods high in potassium are:  Nuts, such as peanuts and pistachios.  Seeds, such as sunflower seeds and pumpkin seeds.  Peas, lentils, and lima beans.  Whole grain and bran cereals and breads.  Fresh fruit and vegetables, such as apricots, avocado, bananas, cantaloupe, kiwi, oranges, tomatoes, asparagus, and potatoes.  Orange and tomato juices.  Red meats.  Fruit yogurt. HOME CARE INSTRUCTIONS  Take all medicines as prescribed by your health care provider.  Maintain a healthy diet by including nutritious food, such as fruits, vegetables, nuts, whole grains, and lean meats.  If  you are taking a laxative, be sure to follow the directions on the label. SEEK MEDICAL CARE IF:  Your weakness gets worse.  You feel your heart pounding or racing.  You are vomiting or having diarrhea.  You are diabetic and having trouble keeping your blood glucose in the normal range. SEEK IMMEDIATE MEDICAL CARE IF:  You have chest pain, shortness of breath, or dizziness.  You are vomiting or having diarrhea for more than 2 days.  You faint. MAKE SURE YOU:   Understand these instructions.  Will watch your condition.  Will get help right away if you are not doing well or get worse. Document Released: 12/09/2005 Document Revised: 09/29/2013 Document Reviewed: 06/11/2013 [employer] Patient Information 2014 [employer].  

## 2014-05-02 ENCOUNTER — Ambulatory Visit
Admission: RE | Admit: 2014-05-02 | Discharge: 2014-05-02 | Disposition: A | Discharge status: Home / Self Care | Payer: BC Managed Care – PPO | Source: Ambulatory Visit | Attending: Adult Health | Admitting: Adult Health

## 2014-05-02 DIAGNOSIS — C50919 Malignant neoplasm of unspecified site of unspecified female breast: Secondary | ICD-10-CM

## 2014-05-02 MED ORDER — GADOBENATE DIMEGLUMINE 529 MG/ML IV SOLN
19.0000 mL | Freq: Once | INTRAVENOUS | Status: AC | PRN
  Administered 2014-05-02: 19 mL via INTRAVENOUS

## 2014-05-04 ENCOUNTER — Telehealth: Payer: Self-pay | Admitting: *Deleted

## 2014-05-04 NOTE — Telephone Encounter (Signed)
As noted below by Mendel Ryder, NP, I informed patient that her mass is half the size it was. Patient verbalized understanding.

## 2014-05-04 NOTE — Telephone Encounter (Signed)
Message copied by Hebert Soho on Wed May 04, 2014  9:50 AM ------      Message from: Minette Headland      Created: Tue May 03, 2014  1:52 PM       Please call patient and let her know that her mass is half the size it was.            Thanks, [place]             ----- Message -----         From: Rad Results In Interface         Sent: 05/02/2014   2:35 PM           To: Minette Headland, NP                   ------

## 2014-05-05 ENCOUNTER — Inpatient Hospital Stay (HOSPITAL_COMMUNITY)
Admission: EM | Admit: 2014-05-05 | Discharge: 2014-05-07 | DRG: 871 | Disposition: A | Discharge status: Home / Self Care | Payer: BC Managed Care – PPO | Attending: Family Medicine | Admitting: Family Medicine

## 2014-05-05 ENCOUNTER — Ambulatory Visit (HOSPITAL_BASED_OUTPATIENT_CLINIC_OR_DEPARTMENT_OTHER): Payer: BC Managed Care – PPO | Admitting: Adult Health

## 2014-05-05 ENCOUNTER — Encounter: Payer: Self-pay | Admitting: Adult Health

## 2014-05-05 ENCOUNTER — Other Ambulatory Visit: Payer: Self-pay

## 2014-05-05 ENCOUNTER — Emergency Department (HOSPITAL_COMMUNITY): Payer: BC Managed Care – PPO

## 2014-05-05 ENCOUNTER — Encounter (HOSPITAL_COMMUNITY): Payer: Self-pay

## 2014-05-05 ENCOUNTER — Encounter (HOSPITAL_COMMUNITY): Payer: Self-pay | Admitting: Emergency Medicine

## 2014-05-05 ENCOUNTER — Ambulatory Visit (HOSPITAL_COMMUNITY)
Admission: RE | Admit: 2014-05-05 | Discharge: 2014-05-05 | Disposition: A | Discharge status: Home / Self Care | Payer: BC Managed Care – PPO | Source: Ambulatory Visit | Attending: Adult Health | Admitting: Adult Health

## 2014-05-05 ENCOUNTER — Other Ambulatory Visit: Payer: Self-pay | Admitting: *Deleted

## 2014-05-05 ENCOUNTER — Other Ambulatory Visit (HOSPITAL_BASED_OUTPATIENT_CLINIC_OR_DEPARTMENT_OTHER): Payer: BC Managed Care – PPO

## 2014-05-05 VITALS — BP 85/60 | HR 121 | Temp 98.9°F | Resp 18 | Ht 64.0 in | Wt 187.7 lb

## 2014-05-05 DIAGNOSIS — R0602 Shortness of breath: Secondary | ICD-10-CM

## 2014-05-05 DIAGNOSIS — Z881 Allergy status to other antibiotic agents status: Secondary | ICD-10-CM

## 2014-05-05 DIAGNOSIS — C50919 Malignant neoplasm of unspecified site of unspecified female breast: Secondary | ICD-10-CM

## 2014-05-05 DIAGNOSIS — Z800 Family history of malignant neoplasm of digestive organs: Secondary | ICD-10-CM

## 2014-05-05 DIAGNOSIS — I959 Hypotension, unspecified: Secondary | ICD-10-CM

## 2014-05-05 DIAGNOSIS — D649 Anemia, unspecified: Secondary | ICD-10-CM | POA: Diagnosis present

## 2014-05-05 DIAGNOSIS — E860 Dehydration: Secondary | ICD-10-CM

## 2014-05-05 DIAGNOSIS — N390 Urinary tract infection, site not specified: Secondary | ICD-10-CM | POA: Diagnosis present

## 2014-05-05 DIAGNOSIS — Z79899 Other long term (current) drug therapy: Secondary | ICD-10-CM

## 2014-05-05 DIAGNOSIS — R0600 Dyspnea, unspecified: Secondary | ICD-10-CM

## 2014-05-05 DIAGNOSIS — Z791 Long term (current) use of non-steroidal anti-inflammatories (NSAID): Secondary | ICD-10-CM

## 2014-05-05 DIAGNOSIS — Z6832 Body mass index (BMI) 32.0-32.9, adult: Secondary | ICD-10-CM

## 2014-05-05 DIAGNOSIS — C50519 Malignant neoplasm of lower-outer quadrant of unspecified female breast: Secondary | ICD-10-CM

## 2014-05-05 DIAGNOSIS — E872 Acidosis, unspecified: Secondary | ICD-10-CM | POA: Diagnosis present

## 2014-05-05 DIAGNOSIS — R0609 Other forms of dyspnea: Secondary | ICD-10-CM

## 2014-05-05 DIAGNOSIS — A419 Sepsis, unspecified organism: Principal | ICD-10-CM | POA: Diagnosis present

## 2014-05-05 DIAGNOSIS — D702 Other drug-induced agranulocytosis: Secondary | ICD-10-CM

## 2014-05-05 DIAGNOSIS — R6520 Severe sepsis without septic shock: Secondary | ICD-10-CM

## 2014-05-05 DIAGNOSIS — Z170 Estrogen receptor positive status [ER+]: Secondary | ICD-10-CM

## 2014-05-05 DIAGNOSIS — D709 Neutropenia, unspecified: Secondary | ICD-10-CM | POA: Diagnosis present

## 2014-05-05 DIAGNOSIS — C773 Secondary and unspecified malignant neoplasm of axilla and upper limb lymph nodes: Secondary | ICD-10-CM

## 2014-05-05 DIAGNOSIS — R0989 Other specified symptoms and signs involving the circulatory and respiratory systems: Secondary | ICD-10-CM

## 2014-05-05 DIAGNOSIS — I10 Essential (primary) hypertension: Secondary | ICD-10-CM | POA: Diagnosis present

## 2014-05-05 DIAGNOSIS — R6521 Severe sepsis with septic shock: Secondary | ICD-10-CM

## 2014-05-05 DIAGNOSIS — E669 Obesity, unspecified: Secondary | ICD-10-CM | POA: Diagnosis present

## 2014-05-05 DIAGNOSIS — Z9089 Acquired absence of other organs: Secondary | ICD-10-CM

## 2014-05-05 DIAGNOSIS — R7989 Other specified abnormal findings of blood chemistry: Secondary | ICD-10-CM

## 2014-05-05 DIAGNOSIS — T50904A Poisoning by unspecified drugs, medicaments and biological substances, undetermined, initial encounter: Secondary | ICD-10-CM

## 2014-05-05 DIAGNOSIS — Z8249 Family history of ischemic heart disease and other diseases of the circulatory system: Secondary | ICD-10-CM

## 2014-05-05 LAB — COMPREHENSIVE METABOLIC PANEL (CC13)
ALBUMIN: 3.5 g/dL (ref 3.5–5.0)
ALK PHOS: 85 U/L (ref 40–150)
ALT: 45 U/L (ref 0–55)
ANION GAP: 16 meq/L — AB (ref 3–11)
AST: 17 U/L (ref 5–34)
BILIRUBIN TOTAL: 0.99 mg/dL (ref 0.20–1.20)
BUN: 19.9 mg/dL (ref 7.0–26.0)
CALCIUM: 9.6 mg/dL (ref 8.4–10.4)
CO2: 21 mEq/L — ABNORMAL LOW (ref 22–29)
Chloride: 102 mEq/L (ref 98–109)
Creatinine: 0.9 mg/dL (ref 0.6–1.1)
GLUCOSE: 122 mg/dL (ref 70–140)
Potassium: 4.7 mEq/L (ref 3.5–5.1)
SODIUM: 138 meq/L (ref 136–145)
Total Protein: 6.5 g/dL (ref 6.4–8.3)

## 2014-05-05 LAB — URINALYSIS, MICROSCOPIC - CHCC
Bilirubin (Urine): NEGATIVE
Glucose: 250 mg/dL
KETONES: 5 mg/dL
NITRITE: NEGATIVE
PROTEIN: 300 mg/dL
RBC / HPF: NEGATIVE (ref 0–2)
Specific Gravity, Urine: 1.03 (ref 1.003–1.035)
Urobilinogen, UR: 0.2 mg/dL (ref 0.2–1)
pH: 6 (ref 4.6–8.0)

## 2014-05-05 LAB — CBC WITH DIFFERENTIAL/PLATELET
BASO%: 0 % (ref 0.0–2.0)
BASOS ABS: 0 10*3/uL (ref 0.0–0.1)
EOS%: 0 % (ref 0.0–7.0)
Eosinophils Absolute: 0 10*3/uL (ref 0.0–0.5)
HCT: 29.2 % — ABNORMAL LOW (ref 34.8–46.6)
HGB: 9.8 g/dL — ABNORMAL LOW (ref 11.6–15.9)
LYMPH#: 0.8 10*3/uL — AB (ref 0.9–3.3)
LYMPH%: 45.9 % (ref 14.0–49.7)
MCH: 31.8 pg (ref 25.1–34.0)
MCHC: 33.6 g/dL (ref 31.5–36.0)
MCV: 94.8 fL (ref 79.5–101.0)
MONO#: 0.5 10*3/uL (ref 0.1–0.9)
MONO%: 27.3 % — AB (ref 0.0–14.0)
NEUT#: 0.5 10*3/uL — CL (ref 1.5–6.5)
NEUT%: 26.8 % — ABNORMAL LOW (ref 38.4–76.8)
NRBC: 0 % (ref 0–0)
Platelets: 164 10*3/uL (ref 145–400)
RBC: 3.08 10*6/uL — AB (ref 3.70–5.45)
RDW: 18.6 % — ABNORMAL HIGH (ref 11.2–14.5)
WBC: 1.8 10*3/uL — ABNORMAL LOW (ref 3.9–10.3)

## 2014-05-05 LAB — CG4 I-STAT (LACTIC ACID): Lactic Acid, Venous: 2.94 mmol/L — ABNORMAL HIGH (ref 0.5–2.2)

## 2014-05-05 LAB — LACTIC ACID, PLASMA: Lactic Acid, Venous: 2.5 mmol/L — ABNORMAL HIGH (ref 0.5–2.2)

## 2014-05-05 MED ORDER — SODIUM CHLORIDE 0.9 % IV SOLN
Freq: Once | INTRAVENOUS | Status: AC
  Administered 2014-05-05: 12:00:00 via INTRAVENOUS

## 2014-05-05 MED ORDER — SODIUM CHLORIDE 0.9 % IV SOLN
Freq: Once | INTRAVENOUS | Status: AC
  Administered 2014-05-05: 10:00:00 via INTRAVENOUS

## 2014-05-05 MED ORDER — ONDANSETRON HCL 4 MG PO TABS: 4.0000 mg | ORAL_TABLET | Freq: Four times a day (QID) | ORAL | Status: DC | PRN

## 2014-05-05 MED ORDER — PIPERACILLIN-TAZOBACTAM 3.375 G IVPB
3.3750 g | Freq: Three times a day (TID) | INTRAVENOUS | Status: DC
  Administered 2014-05-05 – 2014-05-07 (×5): 3.375 g via INTRAVENOUS
  Filled 2014-05-05 (×8): qty 50

## 2014-05-05 MED ORDER — SODIUM CHLORIDE 0.9 % IJ SOLN: 3.0000 mL | Freq: Two times a day (BID) | INTRAMUSCULAR | Status: DC

## 2014-05-05 MED ORDER — SODIUM CHLORIDE 0.9 % IJ SOLN
10.0000 mL | INTRAMUSCULAR | Status: DC | PRN
  Administered 2014-05-05: 20 mL
  Administered 2014-05-06: 10 mL

## 2014-05-05 MED ORDER — STERILE WATER FOR INJECTION IJ SOLN
INTRAMUSCULAR | Status: AC
  Filled 2014-05-05: qty 10

## 2014-05-05 MED ORDER — PIPERACILLIN-TAZOBACTAM 3.375 G IVPB 30 MIN
3.3750 g | Freq: Once | INTRAVENOUS | Status: AC
  Administered 2014-05-05: 3.375 g via INTRAVENOUS
  Filled 2014-05-05: qty 50

## 2014-05-05 MED ORDER — VANCOMYCIN HCL IN DEXTROSE 1-5 GM/200ML-% IV SOLN
1000.0000 mg | Freq: Once | INTRAVENOUS | Status: AC
  Administered 2014-05-05: 1000 mg via INTRAVENOUS
  Filled 2014-05-05: qty 200

## 2014-05-05 MED ORDER — SODIUM CHLORIDE 0.9 % IV SOLN: INTRAVENOUS | Status: DC

## 2014-05-05 MED ORDER — HEPARIN SOD (PORK) LOCK FLUSH 100 UNIT/ML IV SOLN: 500.0000 [IU] | INTRAVENOUS | Status: DC | PRN

## 2014-05-05 MED ORDER — ALUM & MAG HYDROXIDE-SIMETH 200-200-20 MG/5ML PO SUSP
30.0000 mL | ORAL | Status: DC | PRN
  Administered 2014-05-05 – 2014-05-07 (×4): 30 mL via ORAL
  Filled 2014-05-05 (×4): qty 30

## 2014-05-05 MED ORDER — HEPARIN SOD (PORK) LOCK FLUSH 100 UNIT/ML IV SOLN
500.0000 [IU] | Freq: Once | INTRAVENOUS | Status: DC
  Filled 2014-05-05: qty 5

## 2014-05-05 MED ORDER — IOHEXOL 350 MG/ML SOLN: 100.0000 mL | Freq: Once | INTRAVENOUS | Status: DC | PRN

## 2014-05-05 MED ORDER — ZOLPIDEM TARTRATE 5 MG PO TABS
5.0000 mg | ORAL_TABLET | Freq: Every evening | ORAL | Status: DC | PRN
  Administered 2014-05-05 – 2014-05-06 (×2): 5 mg via ORAL
  Filled 2014-05-05 (×2): qty 1

## 2014-05-05 MED ORDER — ACETAMINOPHEN 650 MG RE SUPP: 650.0000 mg | Freq: Four times a day (QID) | RECTAL | Status: DC | PRN

## 2014-05-05 MED ORDER — SODIUM CHLORIDE 0.9 % IV SOLN
Freq: Once | INTRAVENOUS | Status: AC
  Administered 2014-05-05: 15:00:00 via INTRAVENOUS

## 2014-05-05 MED ORDER — SODIUM CHLORIDE 0.9 % IV SOLN
INTRAVENOUS | Status: DC
  Administered 2014-05-05 – 2014-05-07 (×5): via INTRAVENOUS

## 2014-05-05 MED ORDER — IOHEXOL 350 MG/ML SOLN
100.0000 mL | Freq: Once | INTRAVENOUS | Status: AC | PRN
  Administered 2014-05-05: 100 mL via INTRAVENOUS

## 2014-05-05 MED ORDER — SODIUM CHLORIDE 0.9 % IJ SOLN
10.0000 mL | INTRAMUSCULAR | Status: DC | PRN
  Filled 2014-05-05: qty 10

## 2014-05-05 MED ORDER — VANCOMYCIN HCL IN DEXTROSE 1-5 GM/200ML-% IV SOLN
1000.0000 mg | Freq: Two times a day (BID) | INTRAVENOUS | Status: DC
  Administered 2014-05-06 – 2014-05-07 (×3): 1000 mg via INTRAVENOUS
  Filled 2014-05-05 (×5): qty 200

## 2014-05-05 MED ORDER — ACETAMINOPHEN 325 MG PO TABS
650.0000 mg | ORAL_TABLET | Freq: Four times a day (QID) | ORAL | Status: DC | PRN
  Administered 2014-05-05: 650 mg via ORAL
  Filled 2014-05-05: qty 2

## 2014-05-05 MED ORDER — SODIUM CHLORIDE 0.9 % IV BOLUS (SEPSIS)
1000.0000 mL | Freq: Once | INTRAVENOUS | Status: AC
  Administered 2014-05-05: 1000 mL via INTRAVENOUS

## 2014-05-05 MED ORDER — ONDANSETRON HCL 4 MG/2ML IJ SOLN: 4.0000 mg | Freq: Four times a day (QID) | INTRAMUSCULAR | Status: DC | PRN

## 2014-05-05 MED ORDER — DEXTROSE 5 % IV SOLN
1.0000 g | INTRAVENOUS | Status: DC
  Filled 2014-05-05: qty 10

## 2014-05-05 NOTE — ED Notes (Addendum)
Patient reports to ED from [place] center where she was receiving a routine checkup when staff measured BP 85/60. [facility] staff administered 1.5 L fluid IV via portacath, patient's current BP is not 101/51. Patient denies dizziness, CP, SOB, states only symptom is some weakness.

## 2014-05-05 NOTE — ED Notes (Signed)
Docherty, EDP given CG4 Lactic results.

## 2014-05-05 NOTE — ED Notes (Signed)
Fluids stopped on patient. Will draw blood labs in 20 minutes.

## 2014-05-05 NOTE — ED Notes (Signed)
MD at bedside. 

## 2014-05-05 NOTE — ED Notes (Signed)
Pt sts she does not want to stay overnight because she is coming back tomorrow to be seen in cancer center again. Dr. Tawnya Crook left for the day and Dr. [place] Pink paged at this time.

## 2014-05-05 NOTE — ED Provider Notes (Signed)
CSN: [id number]     Arrival date & time 05/05/14  1322 History   First MD Initiated Contact with Patient 05/05/14 1329     Chief Complaint  Patient presents with  . Hypotension     (Consider location/radiation/quality/duration/timing/severity/associated sxs/prior Treatment) HPI Comments: Pt is a 58 y.o. female with Pmhx as above who presents with persistent hypotension in oncology office. Pt reports lightheadedness, fatigue, SOB on exertion this morning which she states have improved after 3L NS. She took her lisinopril this morning. Denies fever, chills, cough, n/v. She has occasional diarrhea, and has had some dysuria which she states is typical after receiving chemo (last chemo about 1 week ago). Her daughter states she was very pale this morning, but looks improved now.   The history is provided by the patient. No language interpreter was used.    Past Medical History  Diagnosis Date  . 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 Hypertension   . Allergy   . Hypertension 01/10/2014  . Wears glasses    Past Surgical History  Procedure Laterality Date  . Colon resection  2004  . Hernia repair  2005    umb  . Cholecystectomy  2005  . Portacath placement Left 01/31/2014    Procedure: INSERTION PORT-A-CATH;  Surgeon: Rolm Bookbinder, MD;  Location: [facility] OR;  Service: General;  Laterality: Left;   Family History  Problem Relation Age of Onset  . Cancer Father 60    colon cancer  . Hypertension Mother    History  Substance Use Topics  . Smoking status: Never Smoker   . Smokeless tobacco: Never Used  . Alcohol Use: No   OB History   Grav Para Term Preterm Abortions TAB SAB Ect Mult Living                 Obstetric Comments   menarche at 64, post-menpause 2007, no HRT, age first 35 x 1     Review of Systems  Constitutional: Positive for fatigue. Negative for fever, chills, diaphoresis, activity change and appetite change.   HENT: Negative for congestion, facial swelling, rhinorrhea and sore throat.   Eyes: Negative for photophobia and discharge.  Respiratory: Positive for shortness of breath. Negative for cough and chest tightness.   Cardiovascular: Negative for chest pain, palpitations and leg swelling.  Gastrointestinal: Negative for nausea, vomiting, abdominal pain and diarrhea.  Endocrine: Negative for polydipsia and polyuria.  Genitourinary: Positive for dysuria. Negative for frequency, difficulty urinating and pelvic pain.  Musculoskeletal: Negative for arthralgias, back pain, neck pain and neck stiffness.  Skin: Positive for pallor. Negative for color change and wound.  Allergic/Immunologic: Negative for immunocompromised state.  Neurological: Positive for light-headedness. Negative for facial asymmetry, weakness, numbness and headaches.  Hematological: Does not bruise/bleed easily.  Psychiatric/Behavioral: Negative for confusion and agitation.      Allergies  Ciprofloxacin  Home Medications   Prior to Admission medications   Medication Sig Start Date End Date Taking? Authorizing Provider  B Complex-C (SUPER B COMPLEX PO) Take 1 tablet by mouth every morning.    Historical Provider, MD  cetirizine (ZYRTEC) 10 MG tablet Take 10 mg by mouth daily.    Historical Provider, MD  clonazePAM (KLONOPIN) 0.5 MG tablet Take 0.5 mg by mouth as needed for anxiety (take half a tablet as needed).    Historical Provider, MD  dexamethasone (DECADRON) 4 MG tablet Take 2 tablets (8 mg total) by mouth  2 (two) times daily with a meal. Take two times a day the day before Taxotere. Then take two times a day starting the day after chemo for 3 days. 04/07/14   Minette Headland, NP  ibuprofen (ADVIL,MOTRIN) 800 MG tablet Take 800 mg by mouth as needed.    Historical Provider, MD  lidocaine-prilocaine (EMLA) cream Apply topically as needed. 01/25/14   [name], MD  lisinopril-hydrochlorothiazide (PRINZIDE,ZESTORETIC)  10-12.5 MG per tablet Take 1 tablet by mouth daily.    Historical Provider, MD  LORazepam (ATIVAN) 0.5 MG tablet Take 1 tablet (0.5 mg total) by mouth every 6 (six) hours as needed (Nausea or vomiting). 01/25/14   [name], MD  ondansetron (ZOFRAN) 8 MG tablet Take 1 tablet (8 mg total) by mouth 2 (two) times daily. Take two times a day starting the day after chemo for 3 days. Then take two times a day as needed for nausea or vomiting. 01/25/14   [name], MD  potassium chloride (KLOR-CON) 20 MEQ packet Take 20 mEq by mouth 2 (two) times daily. 04/28/14   Minette Headland, NP  prochlorperazine (COMPAZINE) 10 MG tablet Take 1 tablet (10 mg total) by mouth every 6 (six) hours as needed (Nausea or vomiting). 01/25/14   [name], MD  prochlorperazine (COMPAZINE) 25 MG suppository Place 1 suppository (25 mg total) rectally every 12 (twelve) hours as needed for nausea or vomiting. 04/14/14   Minette Headland, NP  zolpidem (AMBIEN CR) 12.5 MG CR tablet Take 1 tablet (12.5 mg total) by mouth at bedtime as needed for sleep. 03/24/14   Minette Headland, NP  zolpidem (AMBIEN) 10 MG tablet Take 10 mg by mouth at bedtime as needed for sleep (take half a tablet at night).    Historical Provider, MD   BP 101/54  Pulse 105  Temp(Src) 98.2 F (36.8 C) (Oral)  Resp 17  SpO2 100% Physical Exam  Constitutional: She is oriented to person, place, and time. She appears well-developed and well-nourished. No distress.  Chronically ill appearing  HENT:  Head: Normocephalic and atraumatic.  Mouth/Throat: No oropharyngeal exudate.  Eyes: Pupils are equal, round, and reactive to light.  Neck: Normal range of motion. Neck supple.  Cardiovascular: Normal rate, regular rhythm and normal heart sounds.  Exam reveals no gallop and no friction rub.   No murmur heard. Pulmonary/Chest: Effort normal and breath sounds normal. No respiratory distress. She has no wheezes. She has no rales.  Abdominal: Soft. Bowel  sounds are normal. She exhibits no distension and no mass. There is no tenderness. There is no rebound and no guarding.  Musculoskeletal: Normal range of motion. She exhibits no edema and no tenderness.  Neurological: She is alert and oriented to person, place, and time. She has normal strength. She displays no tremor. No cranial nerve deficit or sensory deficit. She exhibits normal muscle tone. She displays a negative Romberg sign. Coordination and gait normal. GCS eye subscore is 4. GCS verbal subscore is 5. GCS motor subscore is 6.  Skin: Skin is warm and dry. There is pallor.  Psychiatric: She has a normal mood and affect.    ED Course  Procedures (including critical care time) Labs Review Labs Reviewed  I-STAT CG4 LACTIC ACID, ED    Imaging Review Dg Chest 2 View  05/05/2014   CLINICAL DATA:  Shortness of breath, history of breast cancer common neutropenia  EXAM: CHEST  2 VIEW  COMPARISON:  CT ANGIO CHEST  W/CM &/OR WO/CM dated 05/05/2014; DG CHEST 2 VIEW dated 01/31/2014  FINDINGS: Port-A-Cath identified on the left with tip over the superior vena cava. The heart size and vascular pattern are normal. No consolidation or effusion.  IMPRESSION: No acute findings.   Electronically Signed   By: Skipper Cliche M.D.   On: 05/05/2014 14:54   Ct Angio Chest Pe W/cm &/or Wo Cm  05/05/2014   CLINICAL DATA:  Shortness of breath, assess for pulmonary embolism, on chemotherapy for breast cancer.  EXAM: CT ANGIOGRAPHY CHEST WITH CONTRAST  TECHNIQUE: Multidetector CT imaging of the chest was performed using the standard protocol during bolus administration of intravenous contrast. Multiplanar CT image reconstructions and MIPs were obtained to evaluate the vascular anatomy.  CONTRAST:  118mL OMNIPAQUE IOHEXOL 350 MG/ML SOLN  COMPARISON:  DG CHEST 1V PORT dated 01/31/2014; DG CHEST 2 VIEW dated 05/05/2014; CT CHEST W/CM dated 01/20/2014  FINDINGS: Mildly suboptimal contrast opacification of the pulmonary artery's  (200 Hounsfield units). Main pulmonary artery is not enlarged. No pulmonary arterial filling defects to the level of the subsegmental branches.  Heart and pericardium are unremarkable, no right heart strain. Thoracic aorta is normal course and caliber, 2 vessel aortic arch is a normal variant. No lymphadenopathy by CT size criteria. Subcentimeter round right axillary lymph nodes, similar. . Tracheobronchial tree is patent, no pneumothorax. Trace bronchial wall thickening. No pleural effusions, focal consolidations, pulmonary nodules or masses.  Included view of the abdomen is unremarkable. Air-filled esophagus. Visualized soft tissues and included osseous structures are nonsuspicious; 3 mm calcifications in right thyroid, unchanged. Please note, right breast is incompletely imaged, not tailored for evaluation of patient's known primary neoplasm. Left chest Port-A-Cath . Mild to moderate degenerative changes of the thoracic spine.  Review of the MIP images confirms the above findings.  IMPRESSION: Mildly suboptimal pulmonary arterial contrast opacification without convincing evidence of pulmonary embolism.  Trace bronchial wall thickening may reflect bronchitis without acute pulmonary process.   Electronically Signed   By: Elon Alas   On: 05/05/2014 15:03     EKG Interpretation None      Date: 05/05/2014  Rate: 97  Rhythm: normal sinus rhythm  QRS Axis: normal  Intervals: normal  ST/T Wave abnormalities: low voltage precordial leads  Conduction Disutrbances:none  Narrative Interpretation:   Old EKG Reviewed: none available    MDM   Final diagnoses:  Neutropenia  Hypotension  Elevated lactic acid level  UTI (lower urinary tract infection)    Pt is a 58 y.o. female with Pmhx as above who presents with persistent hypotension in oncology office. Pt reports lightheadedness, fatigue, SOB on exertion this morning which she states have improved after 3L NS. She took her lisinopril this  morning. Denies fever, chills, cough, n/v. She has occasional diarrhea, and has had some dysuria which she states is typical after receiving chemo (last chemo about 1 week ago). Labs drawn from onc office show neutropenia ([place] 500), stable lytes. Urine possible infected w/ +leukocytes. Blood cultures drawn. On PE, BP 644 systolic. HR 100.  Cardiopulm & abdominal exam benign. She is well-appearing. Will start broad spectrum abx per onc note, have added istat LA, vanc/zosyn, CXR.   3:32 PM LA 2.94. CXR, and CTA unremarkable. BP dropped again into 80's/60's though HR 100 and pt remains well-appearing. Have spoken to [facility] who will admit. Will continue IVF.    Neta Ehlers, MD 05/05/14 [phone]

## 2014-05-05 NOTE — H&P (Addendum)
[facility] History and Physical  Madeline Davidson CHE:[id number] DOB: [birth year] DOA: 05/05/2014  Referring physician: Ernestina Patches, ER physician PCP: [name], MD   Chief Complaint: Low blood pressure  HPI: Madeline Davidson is a 58 y.o. female  With past medical history of hypertension and breast cancer who is undergoing chemotherapy who went to her oncologist office today for followup after chemotherapy last week. Patient was noting increased fatigue, which she attributed to her chemotherapy. She states that has been progressively getting worse with the chemotherapy session. She has occasional diarrhea which is normal including an episode today. Patient did not note any dysuria. She did take her blood pressure medicine this morning. When she came into the oncologist office she was noted to be hypotensive and sent over to the emergency room.  In the emergency room, patient was noted to have a moderate UTI, a mild anion gap of 15 with a white blood cell count 1.8 with a low neutrophil count of 0.5. In addition patient was noted to have a lactic acid level initially of 2.94 and was hypotensive with an initial blood pressure of 85/51. Patient was started on aggressive IV fluids and given a dose of IV vancomycin and Zosyn. Following repeated fluid boluses her pressure slightly improved into the 90s to 100s and she became less tachycardic. She's not had a fever since admission. Hospitalists were called for further evaluation. Patient's chest x-ray was unremarkable   Review of Systems:  Patient seen after arrival to floor. Doing okay. Denies any headaches, vision changes, dysphagia, chest pain, palpitations, shortness of breath, wheeze, cough, abdominal pain, hematuria, dysuria, constipation. She is occasional episodes of diarrhea including earlier today. Nothing severe. She denies any blood in her stool or dark black stool. Denies any focal extremity numbness weakness or pain. Only  pertinent positives are continued heartburn, normal with her chemotherapy. Occasional lower back pain. She feels slightly lightheaded especially when she sits up. This is new. Review of systems otherwise negative.  Past Medical History  Diagnosis Date  . 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 Hypertension   . Allergy   . Hypertension 01/10/2014  . Wears glasses    Past Surgical History  Procedure Laterality Date  . Colon resection  2004  . Hernia repair  2005    umb  . Cholecystectomy  2005  . Portacath placement Left 01/31/2014    Procedure: INSERTION PORT-A-CATH;  Surgeon: Rolm Bookbinder, MD;  Location: [place] [facility];  Service: General;  Laterality: Left;   Social History:  reports that she has never smoked. She has never used smokeless tobacco. She reports that she does not drink alcohol or use illicit drugs.  patient lives at home with her mom. She is quite active and continues to work. She does not use a walker or a cane  Allergies  Allergen Reactions  . Ciprofloxacin Other (See Comments)    Patient had a headache after taking Cipro. Made her sinus infection symptoms worse.    Family History  Problem Relation Age of Onset  . Cancer Father 55    colon cancer  . Hypertension Mother      Prior to Admission medications   Medication Sig Start Date End Date Taking? Authorizing Provider  B Complex-C (SUPER B COMPLEX PO) Take 1 tablet by mouth every morning.   Yes Historical Provider, MD  cetirizine (ZYRTEC) 10 MG tablet Take 10 mg by mouth daily.   Yes  Historical Provider, MD  clonazePAM (KLONOPIN) 0.5 MG tablet Take 0.5 mg by mouth as needed for anxiety (take half a tablet as needed).   Yes Historical Provider, MD  dexamethasone (DECADRON) 4 MG tablet Take 2 tablets (8 mg total) by mouth 2 (two) times daily with a meal. Take two times a day the day before Taxotere. Then take two times a day starting the day after chemo for 3 days.  04/07/14  Yes Minette Headland, NP  ibuprofen (ADVIL,MOTRIN) 800 MG tablet Take 800 mg by mouth as needed for moderate pain.    Yes Historical Provider, MD  lidocaine-prilocaine (EMLA) cream Apply topically as needed. 01/25/14  Yes [name], MD  lisinopril-hydrochlorothiazide (PRINZIDE,ZESTORETIC) 10-12.5 MG per tablet Take 1 tablet by mouth daily.   Yes Historical Provider, MD  LORazepam (ATIVAN) 0.5 MG tablet Take 1 tablet (0.5 mg total) by mouth every 6 (six) hours as needed (Nausea or vomiting). 01/25/14  Yes [name], MD  ondansetron (ZOFRAN) 8 MG tablet Take 1 tablet (8 mg total) by mouth 2 (two) times daily. Take two times a day starting the day after chemo for 3 days. Then take two times a day as needed for nausea or vomiting. 01/25/14  Yes [name], MD  pegfilgrastim (NEULASTA) 6 MG/0.6ML injection Inject 6 mg into the skin once.   Yes Historical Provider, MD  potassium chloride (KLOR-CON) 20 MEQ packet Take 20 mEq by mouth 2 (two) times daily. 04/28/14  Yes Minette Headland, NP  PRESCRIPTION MEDICATION DOCEtaxel (TAXOTERE) 150 mg in dextrose 5 % 250 mL chemo infusion 75 mg/m2  2.06 m2 (Treatment Plan Actual)  Once 04/28/2014   Yes Historical Provider, MD  PRESCRIPTION MEDICATION CARBOplatin (PARAPLATIN) 700 mg in sodium chloride 0.9 % 250 mL chemo infusion 700 mg  Once 04/28/2014   Yes Historical Provider, MD  PRESCRIPTION MEDICATION trastuzumab (HERCEPTIN) 567 mg in sodium chloride 0.9 % 250 mL chemo infusion 6 mg/kg  94.2 kg (Treatment Plan Actual)  Once 04/28/2014   Yes Historical Provider, MD  PRESCRIPTION MEDICATION pertuzumab (PERJETA) 420 mg in sodium chloride 0.9 % 250 mL chemo infusion 420 mg Once 04/28/2014   Yes Historical Provider, MD  prochlorperazine (COMPAZINE) 10 MG tablet Take 1 tablet (10 mg total) by mouth every 6 (six) hours as needed (Nausea or vomiting). 01/25/14  Yes [name], MD  prochlorperazine (COMPAZINE) 25 MG suppository Place 1 suppository (25 mg total)  rectally every 12 (twelve) hours as needed for nausea or vomiting. 04/14/14  Yes Minette Headland, NP  zolpidem (AMBIEN CR) 12.5 MG CR tablet Take 1 tablet (12.5 mg total) by mouth at bedtime as needed for sleep. 03/24/14  Yes Minette Headland, NP   Physical Exam: Filed Vitals:   05/05/14 1700  BP: 96/58  Pulse: 88  Temp: 98.3 F (36.8 C)  Resp: 18    BP 96/58  Pulse 88  Temp(Src) 98.3 F (36.8 C) (Oral)  Resp 18  Ht 5\' 4"  (1.626 m)  Wt 84.823 kg (187 lb)  BMI 32.08 kg/m2  SpO2 98%  General:  Appears calm and comfortable, no acute distress Eyes: Sclera nonicteric, extraocular movements are intact ENT: Normocephalic, atraumatic, mucous membranes are slightly dry Neck: Thick, no JVD Cardiovascular: Regular rate and rhythm, S1-S2 Respiratory: CTA bilaterally,  Abdomen: Soft, nontender, nondistended, positive bowel sounds Skin: No skin breaks, tears or lesions Musculoskeletal: No clubbing or cyanosis or edema Psychiatric: Patient is appropriate, no evidence of psychoses Neurologic:  No focal deficits           Labs on Admission:  Basic Metabolic Panel:  Recent Labs Lab 04/29/14 0855 05/05/14 0838  NA  --  138  K 2.9* 4.7  CO2  --  21*  GLUCOSE  --  122  BUN  --  19.9  CREATININE  --  0.9  CALCIUM  --  9.6   Liver Function Tests:  Recent Labs Lab 05/05/14 0838  AST 17  ALT 45  ALKPHOS 85  BILITOT 0.99  PROT 6.5  ALBUMIN 3.5   No results found for this basename: LIPASE, AMYLASE,  in the last 168 hours No results found for this basename: AMMONIA,  in the last 168 hours CBC:  Recent Labs Lab 05/05/14 0838  WBC 1.8*  NEUTROABS 0.5*  HGB 9.8*  HCT 29.2*  MCV 94.8  PLT 164   Cardiac Enzymes: No results found for this basename: CKTOTAL, CKMB, CKMBINDEX, TROPONINI,  in the last 168 hours  BNP (last 3 results) No results found for this basename: PROBNP,  in the last 8760 hours CBG: No results found for this basename: GLUCAP,  in the last 168  hours  Radiological Exams on Admission: Dg Chest 2 View  05/05/2014   CLINICAL DATA:  Shortness of breath, history of breast cancer common neutropenia  EXAM: CHEST  2 VIEW  COMPARISON:  CT ANGIO CHEST W/CM &/OR WO/CM dated 05/05/2014; DG CHEST 2 VIEW dated 01/31/2014  FINDINGS: Port-A-Cath identified on the left with tip over the superior vena cava. The heart size and vascular pattern are normal. No consolidation or effusion.  IMPRESSION: No acute findings.   Electronically Signed   By: Skipper Cliche M.D.   On: 05/05/2014 14:54   Ct Angio Chest Pe W/cm &/or Wo Cm  05/05/2014   .  IMPRESSION: Mildly suboptimal pulmonary arterial contrast opacification without convincing evidence of pulmonary embolism.  Trace bronchial wall thickening may reflect bronchitis without acute pulmonary process.   Electronically Signed   By: Elon Alas   On: 05/05/2014 15:03    EKG: Independently reviewed. Sinus rhythm with low voltage  Assessment/Plan Principal Problem: Septic shock:: Given hypotension, mild tachycardia and lactic acidosis with source likely urine, patient meets criteria for septic shock. Will cover with broad-spectrum antibiotic. Blood cultures pending. Urine culture pending. His diarrhea is more persistent tomorrow, we'll consider imaging. Placed on neutropenic isolation. Active Problems:   Breast cancer: Oncology notified   Hypotension: Secondary sepsis, improving   UTI (urinary tract infection): Appears to be the sole source of her infection. See above. Neutropenia: Isolation Obesity: Patient meets criteria with BMI of 32  History of hypertension: Patient taking her medications may contribute to some of her low blood pressure. Holding for now.   Code Status: Patient confirms full code  Family Communication: No family present. Unable to reach mother by phone Disposition Plan: Home in the next 2 days once sepsis resolved and blood pressure stable  Time spent: 35 minutes  [facility] Pager [phone]

## 2014-05-05 NOTE — Progress Notes (Addendum)
Hematology and Oncology Follow Up Visit  Madeline Davidson [id number] [birth year] 58 y.o. 05/07/2014 7:51 AM     Principle Diagnosis:Madeline Davidson 58 y.o. 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   Prior Therapy:  #1 Patient on 12/16/2013 felt a lump in her right breast. She states it was nickel size. Because of this she 1 on to see her nurse practitioner. On 12/31/2013 a mammogram was ordered. This showed a lesion in the 7:00 position measuring 3.3 cm. She was also noted to have enlarged lymph nodes. Subsequent ultrasound showed a 2.2 x 2.4 cm mass with enlarged lymph node measuring 1.6 cm. This was biopsied in [place]. 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 Lymph node was positive for metastatic disease. Prognostic markers revealed the tumor to be estrogen receptor +90% progesterone receptor 1% HER-2/neu positive   #2 staging PET/CT performed on 62/22/9798:  Hypermetabolic mass posterior right breast with SUV max 13.6 and  measuring 13.2 cm (image 108). There two 12 in 16 mm hypermetabolic  nodules in the posterior right breast versus lymph nodes with  intense metabolic activity SUV max 10.9. No hypermetabolic  supraclavicular internal mammary nodes. No hypermetabolic  mediastinal lymph nodes. No suspicious pulmonary nodules.  ABDOMEN/PELVIS  There are 3 hypermetabolic periportal lymph nodes. The largest  measures 20 mm with SUV max 13.6. Additional small less than 10 mm  short axis nodes on image 144 and 130 also have intense metabolic  activity. No abnormal metabolic activity within the liver. No  hypermetabolic abdominal pelvic lymph nodes.   Current therapy:  Taxotere, Carboplatin, Herceptin, Perjeta cycle 5 day 8  Interim History: Madeline Davidson 58 y.o. 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  She is here to be evaluated  prior to receiving cycle 5 of 6 planned treatments of Taxotere, Carboplatin, Herceptin, and Perjeta.  She is receiving this treatment in the neaodjuvant setting on day 1 of a 21 day cycle with Neulasta support on day 2.    The patient is here on cycle 5 day 8 of treatment.  She is not feeling very well today.  She is neutropenic and is getting short of breath on exertion.  She is hypotensive today as well.  She reports increased fatigue.  She did have some nausea this week that was relieved with her anti-emetics.  She did have mild diarrhea.  She took Questran BID for the diarrhea, and it helped, however she said it was too nasty to take.  She then took imodium.  She took all of her potassium as prescribed.  She continues to have numbness in her fingertips, however it is mild and intermittent.  She is able to perform all of her activities of daily living.  She does feel lightheaded today.  Otherwise, she denies fevers, chills, vomiting, constipation, or any further concerns  Medications:  Current Facility-Administered Medications  Medication Dose Route Frequency Provider Last Rate Last Dose  . heparin lock flush 100 unit/mL  500 Units Intravenous Once Minette Headland, NP      . sodium chloride 0.9 % injection 10 mL  10 mL Intravenous PRN Minette Headland, NP       No current outpatient prescriptions on file.   Facility-Administered Medications Ordered in Other Visits  Medication Dose Route Frequency Provider Last Rate Last Dose  . 0.9 %  sodium chloride infusion   Intravenous Continuous Annita Brod, MD  125 mL/hr at 05/07/14 0256    . acetaminophen (TYLENOL) tablet 650 mg  650 mg Oral Q6H PRN Annita Brod, MD   650 mg at 05/05/14 1759   Or  . acetaminophen (TYLENOL) suppository 650 mg  650 mg Rectal Q6H PRN Annita Brod, MD      . alum & mag hydroxide-simeth (MAALOX/MYLANTA) 200-200-20 MG/5ML suspension 30 mL  30 mL Oral Q4H PRN Annita Brod, MD   30 mL at 05/06/14 2223  .  heparin lock flush 100 unit/mL  500 Units Intracatheter Prior to discharge Annita Brod, MD      . loratadine (CLARITIN) tablet 10 mg  10 mg Oral Daily Velvet Bathe, MD   10 mg at 05/06/14 1617  . ondansetron (ZOFRAN) tablet 4 mg  4 mg Oral Q6H PRN Annita Brod, MD       Or  . ondansetron [facility]) injection 4 mg  4 mg Intravenous Q6H PRN Annita Brod, MD      . piperacillin-tazobactam (ZOSYN) IVPB 3.375 g  3.375 g Intravenous 3 times per day Annita Brod, MD   3.375 g at 05/07/14 3244  . sodium chloride 0.9 % injection 10 mL  10 mL Intracatheter PRN [name], MD   10 mL at 02/03/14 1759  . sodium chloride 0.9 % injection 10-40 mL  10-40 mL Intracatheter PRN Annita Brod, MD   10 mL at 05/06/14 0435  . sodium chloride 0.9 % injection 3 mL  3 mL Intravenous Q12H Annita Brod, MD      . vancomycin (VANCOCIN) IVPB 1000 mg/200 mL premix  1,000 mg Intravenous Q12H Annita Brod, MD   1,000 mg at 05/07/14 0453  . zolpidem (AMBIEN) tablet 5 mg  5 mg Oral QHS PRN Annita Brod, MD   5 mg at 05/06/14 2225     Allergies:  Allergies  Allergen Reactions  . Ciprofloxacin Other (See Comments)    Patient had a headache after taking Cipro. Made her sinus infection symptoms worse.    Medical History: Past Medical History  Diagnosis Date  . 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 Hypertension   . Allergy   . Hypertension 01/10/2014  . Wears glasses     Surgical History:  Past Surgical History  Procedure Laterality Date  . Colon resection  2004  . Hernia repair  2005    umb  . Cholecystectomy  2005  . Portacath placement Left 01/31/2014    Procedure: INSERTION PORT-A-CATH;  Surgeon: Rolm Bookbinder, MD;  Location: [facility];  Service: General;  Laterality: Left;     Review of Systems: A 10 point review of systems was conducted and is otherwise negative except for what is noted above.    Physical  Exam: Blood pressure 85/60, pulse 121, temperature 98.9 F (37.2 C), temperature source Oral, resp. rate 18, height $RemoveBe'5\' 4"'SOhUwEfEP$  (1.626 m), weight 187 lb 11.2 oz (85.14 kg). GENERAL: Patient is an acutely ill appearing female HEENT:  Sclerae anicteric.  Oropharynx clear and moist. No ulcerations or evidence of oropharyngeal candidiasis. Neck is supple.  NODES:  No cervical, supraclavicular, or axillary lymphadenopathy palpated.  BREAST EXAM:  Deferred. LUNGS:  Clear to auscultation bilaterally.  No wheezes or rhonchi. HEART:  Regular  Rhythm, tachycardic. No murmur appreciated. ABDOMEN:  Soft, nontender.  Positive, normoactive bowel sounds. No organomegaly palpated. MSK:  No focal spinal tenderness to  palpation. Full range of motion bilaterally in the upper extremities. EXTREMITIES:  No peripheral edema.   SKIN:  Clear with no obvious rashes or skin changes. No nail dyscrasia. NEURO:  Nonfocal. Well oriented.  Appropriate affect. ECOG PERFORMANCE STATUS: 1 - Symptomatic but completely ambulatory  Lab Results: Lab Results  Component Value Date   WBC 3.0* 05/06/2014   HGB 7.3* 05/06/2014   HCT 21.1* 05/06/2014   MCV 95.5 05/06/2014   PLT 106* 05/06/2014     Chemistry      Component Value Date/Time   NA 136* 05/06/2014 0440   NA 138 05/05/2014 0838   K 3.5* 05/06/2014 0440   K 4.7 05/05/2014 0838   CL 102 05/06/2014 0440   CO2 22 05/06/2014 0440   CO2 21* 05/05/2014 0838   BUN 18 05/06/2014 0440   BUN 19.9 05/05/2014 0838   CREATININE 0.88 05/06/2014 0440   CREATININE 0.9 05/05/2014 0838      Component Value Date/Time   CALCIUM 8.2* 05/06/2014 0440   CALCIUM 9.6 05/05/2014 0838   ALKPHOS 85 05/05/2014 0838   AST 17 05/05/2014 0838   ALT 45 05/05/2014 0838   BILITOT 0.99 05/05/2014 0838     Assessment and Plan: Welford Roche 58 y.o. female with  1.Stage IIB ER positive, HER-2/neu positive infiltrating ductal carcinoma of the right breast. Please see prior history above.  She is cycle 5 day 8 of  6 planned treatments with Taxotere, Carboplatin, Herceptin, and Perjeta.    2.  Neutropenia: Patient has an [place] of 500.  She will need antibiotics.    3. Hypotension, dehydration: Patient with BP of 85/50, HR of 120, and lightheadedness.  We gave her 1.5 liters of normal saline, however her BP did not improve.  Due to the fact that she is also neutropenic, she may be septic, therefore we have sent her to the ER for additional IV fluids, broad spectrum antibiotics and closer monitoring.  Blood cultures, a urinalysis, and urine culture are all pending.    4. Hypokalemia.  The patient had a potassium level of 2.8 last week and I gave her 60meq of IV potassium on Thursday, 86meq of IV potassium on Friday, and she received PO potassium liquid BID that was prescribed.    5. Neuropathy: This is improved with Super B complex daily.  The patient will continue taking this.    6. Insomnia.  The patient will continue taking Ambien CR.  She is sleeping well.    7. Diarrhea: This was improved this past week, though still present.    The patient will return in two weeks for labs and evaluation.  Should she need to be seen sooner after her discharge from the hospital we will work her in.  She knows to call us in the interim for any questions or concerns.  We can certainly see her sooner if needed.  I spent 40 minutes counseling the patient face to face.  The total time spent in the appointment was 50 minutes.  Minette Headland, NP Medical Oncology [facility] [phone] 05/07/2014 7:51 AM   ADDENDUM: This 58 year old [place] woman was seen this morning in conjunction with Ms. Cornetto because of persistent hypotension. To recap the patient's history:  She underwent right breast and right axillary lymph node biopsy 01/03/2014 in [place] for a clinical T2 N1, stage IIB invasive ductal carcinoma, grade 2, strongly estrogen receptor positive, weakly progesterone receptor positive (1%),  with evidence of HER-2 amplification by immunohistochemistry.  She is being treated neoadjuvantly with carboplatin, docetaxel, trastuzumab, and pertuzumab, most recent treatment 04/28/2014.  Today (day 8 cycle 5 of her chemotherapy) she was found to be hypotensive at the time of her routine followup visit. She was minimally symptomatic, and specifically denied chest pain or pressure. She has been on lisinopril daily, including this morning. She was resuscitated with IV fluids, initially with apparent response, but as the blood pressure remained low after 1-1/2 L of normal saline the patient was referred to the emergency room for further evaluation specifically to include a CT angiogram rule out pulmonary embolism.  I personally saw this patient and performed a substantive portion of this encounter with the listed APP documented above.   Chauncey Cruel, MD

## 2014-05-05 NOTE — Progress Notes (Signed)
Gave pt 10 mg of Loratidine at 12:15p. Pt tolerated medicine well.

## 2014-05-05 NOTE — Progress Notes (Signed)
ANTIBIOTIC CONSULT NOTE - INITIAL  Pharmacy Consult for Vancomycin and Zosyn Indication: rule out sepsis  Allergies  Allergen Reactions  . Ciprofloxacin Other (See Comments)    Patient had a headache after taking Cipro. Made her sinus infection symptoms worse.   Patient Measurements: Height: 5\' 4"  (162.6 cm) Weight: 187 lb (84.823 kg) IBW/kg (Calculated) : 54.7  Vital Signs: Temp: 98.3 F (36.8 C) (05/14 1700) Temp src: Oral (05/14 1700) BP: 96/58 mmHg (05/14 1700) Pulse Rate: 88 (05/14 1700) Intake/Output from previous day:   Intake/Output from this shift:    Labs:  Recent Labs  05/05/14 0838 05/05/14 0838  WBC 1.8*  --   HGB 9.8*  --   PLT 164  --   CREATININE  --  0.9   Estimated Creatinine Clearance: 72.6 ml/min (by C-G formula based on Cr of 0.9). No results found for this basename: VANCOTROUGH, VANCOPEAK, VANCORANDOM, GENTTROUGH, GENTPEAK, GENTRANDOM, TOBRATROUGH, TOBRAPEAK, TOBRARND, AMIKACINPEAK, AMIKACINTROU, AMIKACIN,  in the last 72 hours   Microbiology: No results found for this or any previous visit (from the past 720 hour(s)).  Medical History: Past Medical History  Diagnosis Date  . 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 Hypertension   . Allergy   . Hypertension 01/10/2014  . Wears glasses    Medications:  Anti-infectives   Start     Dose/Rate Route Frequency Ordered Stop   05/06/14 0800  cefTRIAXone (ROCEPHIN) 1 g in dextrose 5 % 50 mL IVPB  Status:  Discontinued     1 g 100 mL/hr over 30 Minutes Intravenous Every 24 hours 05/05/14 1723 05/05/14 2031   05/05/14 1415  vancomycin (VANCOCIN) IVPB 1000 mg/200 mL premix     1,000 mg 200 mL/hr over 60 Minutes Intravenous  Once 05/05/14 1401 05/05/14 1644   05/05/14 1415  piperacillin-tazobactam (ZOSYN) IVPB 3.375 g     3.375 g 100 mL/hr over 30 Minutes Intravenous  Once 05/05/14 1401 05/05/14 1538     Assessment: 57yo F undergoing  chemotherapy for breast cancer presented with increasing fatigue and hypotension. Pharmacy is asked to dose Vanc and Zosyn for sepsis. First doses given in the ED.    Tmax: Afebrile  WBCs: low, chemo given 5/7.  Renal: SCr wnl, CrCl 2ml/min  Goal of Therapy:  Vancomycin trough level 15-20 mcg/ml Appropriate antibiotic dosing for renal function; eradication of infection  Plan:   Vancomycin 1g IV q12h.  Zosyn 3.375g IV Q8H infused over 4hrs.  Measure Vanc trough at steady state.  Follow up renal fxn and culture results.  Romeo Rabon, PharmD, pager [phone]. 05/05/2014,8:42 PM.

## 2014-05-05 NOTE — Progress Notes (Signed)
Utilization Review completed.  Eiden Bagot RN CM  

## 2014-05-06 DIAGNOSIS — R0602 Shortness of breath: Secondary | ICD-10-CM

## 2014-05-06 LAB — PROLACTIN: Prolactin: 12.4 ng/mL

## 2014-05-06 LAB — BASIC METABOLIC PANEL
BUN: 18 mg/dL (ref 6–23)
CO2: 22 meq/L (ref 19–32)
CREATININE: 0.88 mg/dL (ref 0.50–1.10)
Calcium: 8.2 mg/dL — ABNORMAL LOW (ref 8.4–10.5)
Chloride: 102 mEq/L (ref 96–112)
GFR calc Af Amer: 83 mL/min — ABNORMAL LOW (ref >90)
GFR calc non Af Amer: 72 mL/min — ABNORMAL LOW (ref >90)
GLUCOSE: 94 mg/dL (ref 70–99)
Potassium: 3.5 mEq/L — ABNORMAL LOW (ref 3.7–5.3)
Sodium: 136 mEq/L — ABNORMAL LOW (ref 137–147)

## 2014-05-06 LAB — CBC
HEMATOCRIT: 21.1 % — AB (ref 36.0–46.0)
HEMOGLOBIN: 7.3 g/dL — AB (ref 12.0–15.0)
MCH: 33 pg (ref 26.0–34.0)
MCHC: 34.6 g/dL (ref 30.0–36.0)
MCV: 95.5 fL (ref 78.0–100.0)
Platelets: 106 10*3/uL — ABNORMAL LOW (ref 150–400)
RBC: 2.21 MIL/uL — ABNORMAL LOW (ref 3.87–5.11)
RDW: 18.8 % — ABNORMAL HIGH (ref 11.5–15.5)
WBC: 3 10*3/uL — ABNORMAL LOW (ref 4.0–10.5)

## 2014-05-06 LAB — URINE CULTURE

## 2014-05-06 MED ORDER — LORATADINE 10 MG PO TABS
10.0000 mg | ORAL_TABLET | Freq: Every day | ORAL | Status: DC
  Administered 2014-05-06 – 2014-05-07 (×2): 10 mg via ORAL
  Filled 2014-05-06 (×2): qty 1

## 2014-05-06 NOTE — Care Management Note (Signed)
    Page 1 of 1   05/06/2014     2:34:30 PM CARE MANAGEMENT NOTE 05/06/2014  Patient:  Madeline Davidson, Madeline Davidson   Account Number:  [id number]  Date Initiated:  05/06/2014  Documentation initiated by:  Dessa Phi  Subjective/Objective Assessment:   58 Y/O F ADMITTED W/HYPOTENSION.OI:TGPQDI CA.     Action/Plan:   FROM HOME.HAS PCP,PHARMACY.   Anticipated DC Date:  05/09/2014   Anticipated DC Plan:  [place]  CM consult      Choice offered to / List presented to:             Status of service:  In process, will continue to follow Medicare Important Message given?   (If response is "NO", the following Medicare IM given date fields will be blank) Date Medicare IM given:   Date Additional Medicare IM given:    Discharge Disposition:    Per UR Regulation:  Reviewed for med. necessity/level of care/duration of stay  If discussed at Long Length of Stay Meetings, dates discussed:    Comments:  05/06/14 Madeline Kurka RN,BSN NCM [phone] NO ANTICIPATED D/C NEEDS.

## 2014-05-06 NOTE — Progress Notes (Signed)
[facility] PROGRESS NOTE  Madeline Davidson VPX:[id number] DOB: 1956-02-11 DOA: 05/05/2014 PCP: [name], MD  Assessment/Plan: Principal Problem:   Septic shock - 2ary to UTI  - continue broad spectrum antibiotics  Active Problems:   Breast cancer - Pt to continue routine f/u with oncologist after discharge.    Hypotension - resolving with IVF's and treatment of active infection - continue to monitor.    UTI (urinary tract infection) -awaiting cultures - continue current antibiotic regimen.    Neutropenia, drug-induced - WBC levels increased today  Code Status: full Family Communication: None Disposition Plan: Pending improvement in condition.   Consultants:  [facility]  Procedures:  none  Antibiotics:  Zosyn and Vancomycin  HPI/Subjective: Patient has no new complaints. States that she feels better.  Objective: Filed Vitals:   05/06/14 1405  BP: 108/59  Pulse: 93  Temp: 98.3 F (36.8 C)  Resp: 20    Intake/Output Summary (Last 24 hours) at 05/06/14 1539 Last data filed at 05/06/14 1500  Gross per 24 hour  Intake   3550 ml  Output      0 ml  Net   3550 ml   Filed Weights   05/05/14 1700  Weight: 84.823 kg (187 lb)    Exam:   General:  Pt in NAD, alert and awake  Cardiovascular: RRR, no MRG  Respiratory: CTA BL, no wheezes  Abdomen: obese, ND, + BS  Musculoskeletal: no cyanosis or clubbing   Data Reviewed: Basic Metabolic Panel:  Recent Labs Lab 05/05/14 0838 05/06/14 0440  NA 138 136*  K 4.7 3.5*  CL  --  102  CO2 21* 22  GLUCOSE 122 94  BUN 19.9 18  CREATININE 0.9 0.88  CALCIUM 9.6 8.2*   Liver Function Tests:  Recent Labs Lab 05/05/14 0838  AST 17  ALT 45  ALKPHOS 85  BILITOT 0.99  PROT 6.5  ALBUMIN 3.5   No results found for this basename: LIPASE, AMYLASE,  in the last 168 hours No results found for this basename: AMMONIA,  in the last 168 hours CBC:  Recent Labs Lab 05/05/14 0838  05/06/14 0440  WBC 1.8* 3.0*  NEUTROABS 0.5*  --   HGB 9.8* 7.3*  HCT 29.2* 21.1*  MCV 94.8 95.5  PLT 164 106*   Cardiac Enzymes: No results found for this basename: CKTOTAL, CKMB, CKMBINDEX, TROPONINI,  in the last 168 hours BNP (last 3 results) No results found for this basename: PROBNP,  in the last 8760 hours CBG: No results found for this basename: GLUCAP,  in the last 168 hours  Recent Results (from the past 240 hour(s))  URINE CULTURE     Status: None   Collection Time    05/05/14 10:00 AM      Result Value Ref Range Status   Urine Culture, Routine Culture, Urine   Final   Comment: Final - ===== COLONY COUNT: =====     NO GROWTH     NO GROWTH     Studies: Dg Chest 2 View  05/05/2014   CLINICAL DATA:  Shortness of breath, history of breast cancer common neutropenia  EXAM: CHEST  2 VIEW  COMPARISON:  CT ANGIO CHEST W/CM &/OR WO/CM dated 05/05/2014; DG CHEST 2 VIEW dated 01/31/2014  FINDINGS: Port-A-Cath identified on the left with tip over the superior vena cava. The heart size and vascular pattern are normal. No consolidation or effusion.  IMPRESSION: No acute findings.   Electronically Signed   By: Skipper Cliche  M.D.   On: 05/05/2014 14:54   Ct Angio Chest Pe W/cm &/or Wo Cm  05/05/2014   CLINICAL DATA:  Shortness of breath, assess for pulmonary embolism, on chemotherapy for breast cancer.  EXAM: CT ANGIOGRAPHY CHEST WITH CONTRAST  TECHNIQUE: Multidetector CT imaging of the chest was performed using the standard protocol during bolus administration of intravenous contrast. Multiplanar CT image reconstructions and MIPs were obtained to evaluate the vascular anatomy.  CONTRAST:  133mL OMNIPAQUE IOHEXOL 350 MG/ML SOLN  COMPARISON:  DG CHEST 1V PORT dated 01/31/2014; DG CHEST 2 VIEW dated 05/05/2014; CT CHEST W/CM dated 01/20/2014  FINDINGS: Mildly suboptimal contrast opacification of the pulmonary artery's (200 Hounsfield units). Main pulmonary artery is not enlarged. No pulmonary  arterial filling defects to the level of the subsegmental branches.  Heart and pericardium are unremarkable, no right heart strain. Thoracic aorta is normal course and caliber, 2 vessel aortic arch is a normal variant. No lymphadenopathy by CT size criteria. Subcentimeter round right axillary lymph nodes, similar. . Tracheobronchial tree is patent, no pneumothorax. Trace bronchial wall thickening. No pleural effusions, focal consolidations, pulmonary nodules or masses.  Included view of the abdomen is unremarkable. Air-filled esophagus. Visualized soft tissues and included osseous structures are nonsuspicious; 3 mm calcifications in right thyroid, unchanged. Please note, right breast is incompletely imaged, not tailored for evaluation of patient's known primary neoplasm. Left chest Port-A-Cath . Mild to moderate degenerative changes of the thoracic spine.  Review of the MIP images confirms the above findings.  IMPRESSION: Mildly suboptimal pulmonary arterial contrast opacification without convincing evidence of pulmonary embolism.  Trace bronchial wall thickening may reflect bronchitis without acute pulmonary process.   Electronically Signed   By: Elon Alas   On: 05/05/2014 15:03    Scheduled Meds: . loratadine  10 mg Oral Daily  . piperacillin-tazobactam (ZOSYN)  IV  3.375 g Intravenous 3 times per day  . sodium chloride  3 mL Intravenous Q12H  . vancomycin  1,000 mg Intravenous Q12H   Continuous Infusions: . sodium chloride 125 mL/hr at 05/06/14 1030     Time spent: > 35 minutes    [facility] Pager [phone]. If 7PM-7AM, please contact night-coverage at [email or web address], password [email or web address] 05/06/2014, 3:39 PM  LOS: 1 day

## 2014-05-07 MED ORDER — SULFAMETHOXAZOLE-TMP DS 800-160 MG PO TABS: 1.0000 | ORAL_TABLET | Freq: Two times a day (BID) | ORAL | Status: DC

## 2014-05-07 NOTE — Progress Notes (Signed)
Pt discharged home; discharge instructions explained to patient and pt verbalized understanding them; pt given copy of discharge instructions as well as prescription for bactrim.

## 2014-05-07 NOTE — Discharge Summary (Signed)
Physician Discharge Summary  Madeline Davidson TKZ:[id number] DOB: 1956-10-02 DOA: 05/05/2014  PCP: [name], MD  Admit date: 05/05/2014 Discharge date: 05/07/2014  Time spent: > 35 minutes  Recommendations for Outpatient Follow-up:  1. Please be sure to reassess hemoglobin levels 2. Please decide if patient will require a more prolonged antibiotic regimen based on post discharge hospital followup 3. Patient to continue routine followup with oncologist 4. hold blood pressure medication on discharge  Discharge Diagnoses:  Principal Problem:   Septic shock Active Problems:   Breast cancer   Hypertension   Hypotension   UTI (urinary tract infection)   Neutropenia, drug-induced   Obesity (BMI 30-39.9)   Discharge Condition: Stable  Diet recommendation: Regular  Filed Weights   05/05/14 1700  Weight: 84.823 kg (187 lb)    History of present illness:  Madeline Davidson is a 58 y.o. female  With past medical history of hypertension and breast cancer who is undergoing chemotherapy who went to her oncologist office today for followup after chemotherapy last week. Patient was noting increased fatigue, which she attributed to her chemotherapy. She states that has been progressively getting worse with the chemotherapy session.   Hospital Course:  Sepsis - Resolved on broad spectrum antibiotics - Patient afebrile and WBC up from last check. - Patient requesting to go home  UTI - Urinalysis reporting moderate leukocyte esterase and moderate amount of bacteria. Urine culture shows no growth. - Will discharge on bactrim for 8 more days to complete a total of 10 days of antibiotic treatment.  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 If you have any questions about your discharge medications or the care you received while you were in the hospital after you are discharged, you can call the unit and asked to speak with the hospitalist on call if the hospitalist that took care of you is not available. Once you are discharged, your primary care physician will handle any further medical issues. Please note that NO REFILLS for any discharge medications will be authorized once you are discharged, as it is imperative that you return to your primary care physician (or establish a relationship with a primary care physician if you do not have one) for your aftercare needs so that they can reassess your need for medications and monitor your lab values.  Discharge Instructions   Call MD for:  redness, tenderness, or signs of infection (pain, swelling, redness, odor or green/yellow discharge around incision site)    Complete by:  As directed      Call MD for:  temperature >100.4    Complete by:  As directed      Diet - low sodium heart healthy    Complete by:  As directed      Discharge instructions    Complete by:  As directed   Please followup with your primary care physician within the next one to 2 weeks or sooner should any new concerns  arise     Increase activity slowly    Complete by:  As directed             Medication List    STOP taking these medications       clonazePAM 0.5 MG tablet  Commonly known as:  KLONOPIN     lisinopril-hydrochlorothiazide 10-12.5 MG per tablet  Commonly known as:  PRINZIDE,ZESTORETIC     LORazepam 0.5 MG tablet  Commonly known as:   ATIVAN     pegfilgrastim 6 MG/0.6ML injection  Commonly known as:  NEULASTA     potassium chloride 20 MEQ packet  Commonly known as:  KLOR-CON      TAKE these medications       cetirizine 10 MG tablet  Commonly known as:  ZYRTEC  Take 10 mg by mouth daily.     dexamethasone 4 MG tablet  Commonly known as:  DECADRON  Take 2 tablets (8 mg total) by mouth 2 (two) times daily with a meal. Take two times a day the day before Taxotere. Then take two times a day starting the day after chemo for 3 days.     ibuprofen 800 MG tablet  Commonly known as:  ADVIL,MOTRIN  Take 800 mg by mouth as needed for moderate pain.     lidocaine-prilocaine cream  Commonly known as:  EMLA  Apply topically as needed.     ondansetron 8 MG tablet  Commonly known as:  ZOFRAN  Take 1 tablet (8 mg total) by mouth 2 (two) times daily. Take two times a day starting the day after chemo for 3 days. Then take two times a day as needed for nausea or vomiting.     PRESCRIPTION MEDICATION  DOCEtaxel (TAXOTERE) 150 mg in dextrose 5 % 250 mL chemo infusion 75 mg/m2  2.06 m2 (Treatment Plan Actual)  Once 04/28/2014     PRESCRIPTION MEDICATION  CARBOplatin (PARAPLATIN) 700 mg in sodium chloride 0.9 % 250 mL chemo infusion 700 mg  Once 04/28/2014     PRESCRIPTION MEDICATION  trastuzumab (HERCEPTIN) 567 mg in sodium chloride 0.9 % 250 mL chemo infusion 6 mg/kg  94.2 kg (Treatment Plan Actual)  Once 04/28/2014     PRESCRIPTION MEDICATION  pertuzumab (PERJETA) 420 mg in sodium chloride 0.9 % 250 mL chemo infusion 420 mg Once 04/28/2014     prochlorperazine 10 MG tablet  Commonly known as:  COMPAZINE  Take 1 tablet (10 mg total) by mouth every 6 (six) hours as needed (Nausea or vomiting).     prochlorperazine 25 MG suppository  Commonly known as:  COMPAZINE  Place 1 suppository (25 mg total) rectally every 12 (twelve) hours as needed for nausea or vomiting.     sulfamethoxazole-trimethoprim 800-160 MG per tablet   Commonly known as:  BACTRIM DS  Take 1 tablet by mouth 2 (two) times daily.     SUPER B COMPLEX PO  Take 1 tablet by mouth every morning.     zolpidem 12.5 MG CR tablet  Commonly known as:  AMBIEN CR  Take 1 tablet (12.5 mg total) by mouth at bedtime as needed for sleep.       Allergies  Allergen Reactions  . Ciprofloxacin Other (See Comments)    Patient had a headache after taking Cipro. Made her sinus infection symptoms worse.      The results of significant diagnostics from this hospitalization (including imaging, microbiology, ancillary and laboratory) are listed below for reference.    Significant Diagnostic Studies:  Dg Chest 2 View  05/05/2014   CLINICAL DATA:  Shortness of breath, history of breast cancer common neutropenia  EXAM: CHEST  2 VIEW  COMPARISON:  CT ANGIO CHEST W/CM &/OR WO/CM dated 05/05/2014; DG CHEST 2 VIEW dated 01/31/2014  FINDINGS: Port-A-Cath identified on the left with tip over the superior vena cava. The heart size and vascular pattern are normal. No consolidation or effusion.  IMPRESSION: No acute findings.   Electronically Signed   By: Skipper Cliche M.D.   On: 05/05/2014 14:54   Ct Angio Chest Pe W/cm &/or Wo Cm  05/05/2014   CLINICAL DATA:  Shortness of breath, assess for pulmonary embolism, on chemotherapy for breast cancer.  EXAM: CT ANGIOGRAPHY CHEST WITH CONTRAST  TECHNIQUE: Multidetector CT imaging of the chest was performed using the standard protocol during bolus administration of intravenous contrast. Multiplanar CT image reconstructions and MIPs were obtained to evaluate the vascular anatomy.  CONTRAST:  156m OMNIPAQUE IOHEXOL 350 MG/ML SOLN  COMPARISON:  DG CHEST 1V PORT dated 01/31/2014; DG CHEST 2 VIEW dated 05/05/2014; CT CHEST W/CM dated 01/20/2014  FINDINGS: Mildly suboptimal contrast opacification of the pulmonary artery's (200 Hounsfield units). Main pulmonary artery is not enlarged. No pulmonary arterial filling defects to the level of the  subsegmental branches.  Heart and pericardium are unremarkable, no right heart strain. Thoracic aorta is normal course and caliber, 2 vessel aortic arch is a normal variant. No lymphadenopathy by CT size criteria. Subcentimeter round right axillary lymph nodes, similar. . Tracheobronchial tree is patent, no pneumothorax. Trace bronchial wall thickening. No pleural effusions, focal consolidations, pulmonary nodules or masses.  Included view of the abdomen is unremarkable. Air-filled esophagus. Visualized soft tissues and included osseous structures are nonsuspicious; 3 mm calcifications in right thyroid, unchanged. Please note, right breast is incompletely imaged, not tailored for evaluation of patient's known primary neoplasm. Left chest Port-A-Cath . Mild to moderate degenerative changes of the thoracic spine.  Review of the MIP images confirms the above findings.  IMPRESSION: Mildly suboptimal pulmonary arterial contrast opacification without convincing evidence of pulmonary embolism.  Trace bronchial wall thickening may reflect bronchitis without acute pulmonary process.   Electronically Signed   By: CElon Alas  On: 05/05/2014 15:03   Mr Breast Bilateral W Wo Contrast  05/02/2014   CLINICAL DATA:  Right breast cancer. Evaluate response to neoadjuvant treatment. [place]. One of 2 abnormal intramammary lymph nodes was biopsied and shown to have metastatic carcinoma.  LABS:  Not applicable  EXAM: BILATERAL BREAST MRI WITH AND WITHOUT CONTRAST  TECHNIQUE: Multiplanar, multisequence MR images of both breasts were obtained prior to and following the intravenous administration of 181mof MultiHance.  THREE-DIMENSIONAL MR IMAGE RENDERING ON INDEPENDENT WORKSTATION:  Three-dimensional MR images were rendered by post-processing of the original MR data on an independent workstation. The three-dimensional MR images were interpreted, and  findings are reported in the following complete MRI report for this study. Three dimensional images were evaluated at the independent DynaCad workstation  COMPARISON:  MRI on 01/25/2014 and [place]  FINDINGS: Breast composition: b.  Scattered fibroglandular tissue.  Background parenchymal enhancement: Minimal  Right breast: There has been significant improvement and enhancing mass in the upper-outer quadrant of the right breast. A small area of residual enhancement now measures 3.6 x 1.8 x 1.4 cm. Previously, enhancing mass measured 3.8 x 3.7 x 3.2 cm.  Left breast: No mass or  abnormal enhancement.  Lymph nodes: The two enlarged intramammary lymph nodes previously identified in the upper-outer quadrant of the right breast are currently visible but show normal morphology and size. No suspicious axillary or internal mammary lymph nodes are identified. Stable right paracardiac lymph node is identified, measuring 8 mm in diameter.  Ancillary findings:  None.  IMPRESSION: 1. Significant reduction in size of right breast mass in the upper outer quadrant. 2. Involved right intramammary lymph nodes now have normal appearance and morphology. 3. Stable right pericardiac lymph node, 8 mm in diameter.  RECOMMENDATION: Treatment plan  BI-RADS CATEGORY  6: Known biopsy-proven malignancy.   Electronically Signed   By: Shon Hale M.D.   On: 05/02/2014 14:33    Microbiology: Recent Results (from the past 240 hour(s))  URINE CULTURE     Status: None   Collection Time    05/05/14 10:00 AM      Result Value Ref Range Status   Urine Culture, Routine Culture, Urine   Final   Comment: Final - ===== COLONY COUNT: =====     NO GROWTH     NO GROWTH     Labs: Basic Metabolic Panel:  Recent Labs Lab 05/05/14 0838 05/06/14 0440  NA 138 136*  K 4.7 3.5*  CL  --  102  CO2 21* 22  GLUCOSE 122 94  BUN 19.9 18  CREATININE 0.9 0.88  CALCIUM 9.6 8.2*   Liver Function  Tests:  Recent Labs Lab 05/05/14 0838  AST 17  ALT 45  ALKPHOS 85  BILITOT 0.99  PROT 6.5  ALBUMIN 3.5   No results found for this basename: LIPASE, AMYLASE,  in the last 168 hours No results found for this basename: AMMONIA,  in the last 168 hours CBC:  Recent Labs Lab 05/05/14 0838 05/06/14 0440  WBC 1.8* 3.0*  NEUTROABS 0.5*  --   HGB 9.8* 7.3*  HCT 29.2* 21.1*  MCV 94.8 95.5  PLT 164 106*   Cardiac Enzymes: No results found for this basename: CKTOTAL, CKMB, CKMBINDEX, TROPONINI,  in the last 168 hours BNP: BNP (last 3 results) No results found for this basename: PROBNP,  in the last 8760 hours CBG: No results found for this basename: GLUCAP,  in the last 168 hours     Signed:  [facility] 05/07/2014, 1:23 PM

## 2014-05-09 NOTE — Progress Notes (Signed)
Discharge summary sent to payer through [facility]  

## 2014-05-10 ENCOUNTER — Ambulatory Visit (HOSPITAL_COMMUNITY): Payer: BC Managed Care – PPO

## 2014-05-10 ENCOUNTER — Encounter (HOSPITAL_COMMUNITY): Payer: BC Managed Care – PPO

## 2014-05-11 LAB — CULTURE, BLOOD (SINGLE)

## 2014-05-12 ENCOUNTER — Other Ambulatory Visit: Payer: Self-pay | Admitting: *Deleted

## 2014-05-12 ENCOUNTER — Telehealth: Payer: Self-pay | Admitting: *Deleted

## 2014-05-12 MED ORDER — VENLAFAXINE HCL 37.5 MG PO TABS: 37.5000 mg | ORAL_TABLET | Freq: Two times a day (BID) | ORAL | Status: DC

## 2014-05-12 NOTE — Telephone Encounter (Signed)
Received call from pt stating she was taken off her Clonopin d/t inability to keep her BP from dropping.  Pt relate she needs something else for anxiety.  Per Mendel Ryder called in Effexor to pt pharmacy.  Called pt and gave instructions.  Received verbal understanding.

## 2014-05-17 ENCOUNTER — Telehealth (INDEPENDENT_AMBULATORY_CARE_PROVIDER_SITE_OTHER): Payer: Self-pay

## 2014-05-17 ENCOUNTER — Encounter (INDEPENDENT_AMBULATORY_CARE_PROVIDER_SITE_OTHER): Payer: Self-pay | Admitting: General Surgery

## 2014-05-17 ENCOUNTER — Encounter (INDEPENDENT_AMBULATORY_CARE_PROVIDER_SITE_OTHER): Payer: Self-pay

## 2014-05-17 ENCOUNTER — Ambulatory Visit (INDEPENDENT_AMBULATORY_CARE_PROVIDER_SITE_OTHER): Payer: BC Managed Care – PPO | Admitting: General Surgery

## 2014-05-17 VITALS — BP 134/80 | HR 113 | Temp 98.5°F | Ht 64.0 in | Wt 187.0 lb

## 2014-05-17 DIAGNOSIS — C50919 Malignant neoplasm of unspecified site of unspecified female breast: Secondary | ICD-10-CM

## 2014-05-17 NOTE — Progress Notes (Signed)
Patient ID: Madeline Davidson, female   DOB: [birth year], 58 y.o.   MRN: [id number]  Chief Complaint  Patient presents with  . Breast Cancer Long Term Follow Up    HPI Madeline Davidson is a 58 y.o. female.   HPI 14 yof who lives in [place] and presents after noting a right breast mass around Christmas. She underwent mm that showed a 3.3 cm lesion in the right breast at 7 oclock. She also had enlarged right axillary node that was biopsied. 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 This is er positive at 90%, pr at 1%, her2 is amplified. Initial MRI showed a 3.8x3.7x3.2 cm mass with 2 intramammary nodes present.  There were right axillary nodes also.  There is also a 2.4 cm intramammary node on the right side. PET showed results below.  Latest MRI shows a 3.6x1.8x1.4 cm area of residual enhancement. She states physician couldn't feel mass soon after chemo began. The left breast is normal.  The two enlarged right IM nodes are now normal morphology and size.  There is a stable paracardiac node. She has had a difficult time with chemotherapy requiring admission for what ends up being likely urosepsis within last couple weeks. She is due to get last cycle of chemo this week.  Past Medical History  Diagnosis Date  . 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 Hypertension   . Allergy   . Hypertension 01/10/2014  . Wears glasses     Past Surgical History  Procedure Laterality Date  . Colon resection  2004  . Hernia repair  2005    umb  . Cholecystectomy  2005  . Portacath placement Left 01/31/2014    Procedure: INSERTION PORT-A-CATH;  Surgeon: Rolm Bookbinder, MD;  [facility];  Service: General;  Laterality: Left;    Family History  Problem Relation Age of Onset  . Cancer Father 93    colon cancer  . Hypertension Mother      Social History History  Substance Use Topics  . Smoking status: Never Smoker   . Smokeless tobacco: Never Used  . Alcohol Use: No    Allergies  Allergen Reactions  . Ciprofloxacin Other (See Comments)    Patient had a headache after taking Cipro. Made her sinus infection symptoms worse.    Current Outpatient Prescriptions  Medication Sig Dispense Refill  . B Complex-C (SUPER B COMPLEX PO) Take 1 tablet by mouth every morning.      . cetirizine (ZYRTEC) 10 MG tablet Take 10 mg by mouth daily.      Marland Kitchen dexamethasone (DECADRON) 4 MG tablet Take 2 tablets (8 mg total) by mouth 2 (two) times daily with a meal. Take two times a day the day before Taxotere. Then take two times a day starting the day after chemo for 3 days.  30 tablet  1  . ibuprofen (ADVIL,MOTRIN) 800 MG tablet Take 800 mg by mouth as needed for moderate pain.       Marland Kitchen lidocaine-prilocaine (EMLA) cream Apply topically as needed.  30 g  6  . ondansetron (ZOFRAN) 8 MG tablet Take 1 tablet (8 mg total) by mouth 2 (two) times daily. Take two times a day starting the day after chemo for 3 days. Then take two times a day as needed for nausea or vomiting.  30 tablet  1  . PRESCRIPTION MEDICATION DOCEtaxel (TAXOTERE) 150 mg in dextrose 5 % 250 mL chemo infusion 75 mg/m2  2.06 m2 (Treatment Plan Actual)  Once 04/28/2014      . PRESCRIPTION MEDICATION CARBOplatin (PARAPLATIN) 700 mg in sodium chloride 0.9 % 250 mL chemo infusion 700 mg  Once 04/28/2014      . PRESCRIPTION MEDICATION trastuzumab (HERCEPTIN) 567 mg in sodium chloride 0.9 % 250 mL chemo infusion 6 mg/kg  94.2 kg (Treatment Plan Actual)  Once 04/28/2014      . PRESCRIPTION MEDICATION pertuzumab (PERJETA) 420 mg in sodium chloride 0.9 % 250 mL chemo infusion 420 mg Once 04/28/2014      . prochlorperazine (COMPAZINE) 10 MG tablet Take 1 tablet (10 mg total) by mouth every 6 (six) hours as needed (Nausea or vomiting).  30 tablet  1  . prochlorperazine (COMPAZINE) 25 MG suppository  Place 1 suppository (25 mg total) rectally every 12 (twelve) hours as needed for nausea or vomiting.  12 suppository  0  . sulfamethoxazole-trimethoprim (BACTRIM DS) 800-160 MG per tablet Take 1 tablet by mouth 2 (two) times daily.  14 tablet  0  . venlafaxine (EFFEXOR) 37.5 MG tablet Take 1 tablet (37.5 mg total) by mouth 2 (two) times daily.  60 tablet  6  . zolpidem (AMBIEN CR) 12.5 MG CR tablet Take 1 tablet (12.5 mg total) by mouth at bedtime as needed for sleep.  30 tablet  0   No current facility-administered medications for this visit.   Facility-Administered Medications Ordered in Other Visits  Medication Dose Route Frequency Provider Last Rate Last Dose  . sodium chloride 0.9 % injection 10 mL  10 mL Intracatheter PRN [name], MD   10 mL at 02/03/14 1759    Review of Systems Review of Systems  Constitutional: Positive for fatigue. Negative for fever, chills and unexpected weight change.  HENT: Negative for congestion, hearing loss, sore throat, trouble swallowing and voice change.   Eyes: Negative for visual disturbance.  Respiratory: Positive for shortness of breath. Negative for cough and wheezing.   Cardiovascular: Negative for chest pain, palpitations and leg swelling.  Gastrointestinal: Negative for nausea, vomiting, abdominal pain, diarrhea, constipation, blood in stool, abdominal distention and anal bleeding.  Genitourinary: Negative for hematuria, vaginal bleeding and difficulty urinating.  Musculoskeletal: Negative for arthralgias.  Skin: Negative for rash and wound.  Neurological: Negative for seizures, syncope and headaches.  Hematological: Negative for adenopathy. Does not bruise/bleed easily.  Psychiatric/Behavioral: Negative for confusion.    Blood pressure 134/80, pulse 113, temperature 98.5 F (36.9 C), height $RemoveBe'5\' 4"'hAPhSeWAU$  (1.626 m), weight 187 lb (84.823 kg).  Physical Exam Physical Exam  Vitals reviewed. Constitutional: She appears well-developed and  well-nourished.  HENT:  Head: Normocephalic.  Eyes: No scleral icterus.  Neck: Neck supple.  Cardiovascular: Normal rate, regular rhythm and normal heart sounds.   Pulmonary/Chest: Effort normal and breath sounds normal. She has no wheezes. Right breast exhibits mass. Right breast exhibits no inverted nipple, no nipple discharge, no skin change and no tenderness. Left breast exhibits no inverted nipple, no mass, no nipple discharge, no skin change and no tenderness.    Abdominal: Soft.  Lymphadenopathy:    She has no cervical adenopathy.    She has no axillary adenopathy.       Right: No supraclavicular adenopathy present.       Left: No supraclavicular adenopathy present.    Data Reviewed EXAM:  BILATERAL BREAST MRI WITH AND  WITHOUT CONTRAST  TECHNIQUE:  Multiplanar, multisequence MR images of both breasts were obtained  prior to and following the intravenous administration of 60ml of  MultiHance.  THREE-DIMENSIONAL MR IMAGE RENDERING ON INDEPENDENT WORKSTATION:  Three-dimensional MR images were rendered by post-processing of the  original MR data on an independent workstation. The  three-dimensional MR images were interpreted, and findings are  reported in the following complete MRI report for this study. Three  dimensional images were evaluated at the independent DynaCad  workstation  COMPARISON: MRI on 01/25/2014 and imaging from [place], [place]  on 12/31/2013  FINDINGS:  Breast composition: b. Scattered fibroglandular tissue.  Background parenchymal enhancement: Minimal  Right breast: There has been significant improvement and enhancing  mass in the upper-outer quadrant of the right breast. A small area  of residual enhancement now measures 3.6 x 1.8 x 1.4 cm. Previously,  enhancing mass measured 3.8 x 3.7 x 3.2 cm.  Left breast: No mass or abnormal enhancement.  Lymph nodes: The two enlarged intramammary lymph nodes previously  identified in the upper-outer quadrant  of the right breast are  currently visible but show normal morphology and size. No suspicious  axillary or internal mammary lymph nodes are identified. Stable  right paracardiac lymph node is identified, measuring 8 mm in  diameter.  Ancillary findings: None.  IMPRESSION:  1. Significant reduction in size of right breast mass in the upper  outer quadrant.  2. Involved right intramammary lymph nodes now have normal  appearance and morphology.  3. Stable right pericardiac lymph node, 8 mm in diameter  TECHNIQUE:  17.6 mCi F-18 FDG was injected intravenously. CT data was obtained  and used for attenuation correction and anatomic localization only.  (This was not acquired as a diagnostic CT examination.) Additional  exam technical data entered on technologist worksheet.  COMPARISON: None.  FINDINGS:  NECK  No hypermetabolic lymph nodes in the neck.  CHEST  Hypermetabolic mass posterior right breast with SUV max 13.6 and  measuring 13.2 cm (image 108). There two 12 in 16 mm hypermetabolic  nodules in the posterior right breast versus lymph nodes with  intense metabolic activity SUV max 10.9. No hypermetabolic  supraclavicular internal mammary nodes. No hypermetabolic  mediastinal lymph nodes. No suspicious pulmonary nodules.  ABDOMEN/PELVIS  There are 3 hypermetabolic periportal lymph nodes. The largest  measures 20 mm with SUV max 13.6. Additional small less than 10 mm  short axis nodes on image 144 and 130 also have intense metabolic  activity. No abnormal metabolic activity within the liver. No  hypermetabolic abdominal pelvic lymph nodes.  SKELETON  No focal hypermetabolic activity to suggest skeletal metastasis.  IMPRESSION:  1. Hypermetabolic mass in the posterior right breast consists with  primary breast carcinoma.  2. Two hypermetabolic nodules in the deep right breast either  represents additional foci of breast cancers or metastatic lymph  nodes.  3. No evidence of  central hypermetabolic nodes or mediastinal nodes.  4. Hypermetabolic metastatic lymph nodes within the porta hepatis.   Assessment    Clinical stage II right breast cancer    Plan    I need to follow up with med onc about porta hepatis to see what plan was with those.  I don't see this in any notes.  We also discussed surgery 3-4 weeks after completing chemotherapy.  Her mr has a good result and I think reasonable candidate for seed guided lumpectomy.  She would be for alnd also but I am going to  see if she could participate in alliance trial given preop findings and response now.  I will follow up with her June 10th after being presented at conference next week. Her daughter was present for this conversation.        Rolm Bookbinder 05/17/2014, 12:26 PM

## 2014-05-17 NOTE — Telephone Encounter (Signed)
Added pt to [place] for 05/25/14. F/u appt made with Dr Donne Hazel for 05/31/14.

## 2014-05-19 ENCOUNTER — Ambulatory Visit (HOSPITAL_BASED_OUTPATIENT_CLINIC_OR_DEPARTMENT_OTHER): Payer: BC Managed Care – PPO | Admitting: Adult Health

## 2014-05-19 ENCOUNTER — Telehealth: Payer: Self-pay | Admitting: *Deleted

## 2014-05-19 ENCOUNTER — Other Ambulatory Visit (HOSPITAL_BASED_OUTPATIENT_CLINIC_OR_DEPARTMENT_OTHER): Payer: BC Managed Care – PPO

## 2014-05-19 ENCOUNTER — Encounter: Payer: Self-pay | Admitting: Adult Health

## 2014-05-19 ENCOUNTER — Ambulatory Visit (HOSPITAL_BASED_OUTPATIENT_CLINIC_OR_DEPARTMENT_OTHER): Payer: BC Managed Care – PPO

## 2014-05-19 ENCOUNTER — Telehealth: Payer: Self-pay | Admitting: Adult Health

## 2014-05-19 ENCOUNTER — Ambulatory Visit: Payer: BC Managed Care – PPO

## 2014-05-19 ENCOUNTER — Ambulatory Visit (HOSPITAL_COMMUNITY)
Admission: RE | Admit: 2014-05-19 | Discharge: 2014-05-19 | Disposition: A | Discharge status: Home / Self Care | Payer: BC Managed Care – PPO | Source: Ambulatory Visit | Attending: Adult Health | Admitting: Adult Health

## 2014-05-19 VITALS — BP 137/80 | HR 78 | Temp 98.5°F | Resp 16

## 2014-05-19 VITALS — BP 135/82 | HR 120 | Temp 99.0°F | Resp 18 | Ht 64.0 in | Wt 189.7 lb

## 2014-05-19 DIAGNOSIS — D649 Anemia, unspecified: Secondary | ICD-10-CM

## 2014-05-19 DIAGNOSIS — C50919 Malignant neoplasm of unspecified site of unspecified female breast: Secondary | ICD-10-CM

## 2014-05-19 DIAGNOSIS — G4700 Insomnia, unspecified: Secondary | ICD-10-CM

## 2014-05-19 DIAGNOSIS — K219 Gastro-esophageal reflux disease without esophagitis: Secondary | ICD-10-CM

## 2014-05-19 DIAGNOSIS — IMO0001 Reserved for inherently not codable concepts without codable children: Secondary | ICD-10-CM

## 2014-05-19 LAB — CBC WITH DIFFERENTIAL/PLATELET
BASO%: 0.1 % (ref 0.0–2.0)
BASOS ABS: 0 10*3/uL (ref 0.0–0.1)
EOS%: 0 % (ref 0.0–7.0)
Eosinophils Absolute: 0 10*3/uL (ref 0.0–0.5)
HEMATOCRIT: 21.2 % — AB (ref 34.8–46.6)
LYMPH%: 8.5 % — AB (ref 14.0–49.7)
MCH: 33.2 pg (ref 25.1–34.0)
MCHC: 32.8 g/dL (ref 31.5–36.0)
MCV: 101.3 fL — ABNORMAL HIGH (ref 79.5–101.0)
MONO#: 1 10*3/uL — ABNORMAL HIGH (ref 0.1–0.9)
MONO%: 7.9 % (ref 0.0–14.0)
NEUT#: 10.2 10*3/uL — ABNORMAL HIGH (ref 1.5–6.5)
NEUT%: 83.5 % — AB (ref 38.4–76.8)
Platelets: 375 10*3/uL (ref 145–400)
RBC: 2.1 10*6/uL — ABNORMAL LOW (ref 3.70–5.45)
RDW: 21.1 % — ABNORMAL HIGH (ref 11.2–14.5)
WBC: 12.3 10*3/uL — ABNORMAL HIGH (ref 3.9–10.3)
lymph#: 1 10*3/uL (ref 0.9–3.3)

## 2014-05-19 LAB — COMPREHENSIVE METABOLIC PANEL (CC13)
ALT: 17 U/L (ref 0–55)
ANION GAP: 15 meq/L — AB (ref 3–11)
AST: 16 U/L (ref 5–34)
Albumin: 3.4 g/dL — ABNORMAL LOW (ref 3.5–5.0)
Alkaline Phosphatase: 101 U/L (ref 40–150)
BILIRUBIN TOTAL: 0.43 mg/dL (ref 0.20–1.20)
BUN: 18 mg/dL (ref 7.0–26.0)
CALCIUM: 9.7 mg/dL (ref 8.4–10.4)
CO2: 25 meq/L (ref 22–29)
CREATININE: 1.1 mg/dL (ref 0.6–1.1)
Chloride: 102 mEq/L (ref 98–109)
Glucose: 128 mg/dl (ref 70–140)
Potassium: 3.1 mEq/L — ABNORMAL LOW (ref 3.5–5.1)
Sodium: 142 mEq/L (ref 136–145)
Total Protein: 6.4 g/dL (ref 6.4–8.3)

## 2014-05-19 LAB — PREPARE RBC (CROSSMATCH)

## 2014-05-19 LAB — HOLD TUBE, BLOOD BANK

## 2014-05-19 LAB — ABO/RH: ABO/RH(D): A POS

## 2014-05-19 MED ORDER — DIPHENHYDRAMINE HCL 25 MG PO CAPS
25.0000 mg | ORAL_CAPSULE | Freq: Once | ORAL | Status: AC
  Administered 2014-05-19: 25 mg via ORAL

## 2014-05-19 MED ORDER — ZOLPIDEM TARTRATE ER 12.5 MG PO TBCR: 12.5000 mg | EXTENDED_RELEASE_TABLET | Freq: Every evening | ORAL | Status: DC | PRN

## 2014-05-19 MED ORDER — ACETAMINOPHEN 325 MG PO TABS
ORAL_TABLET | ORAL | Status: AC
  Filled 2014-05-19: qty 2

## 2014-05-19 MED ORDER — SODIUM CHLORIDE 0.9 % IJ SOLN
10.0000 mL | INTRAMUSCULAR | Status: AC | PRN
  Administered 2014-05-19: 10 mL
  Filled 2014-05-19: qty 10

## 2014-05-19 MED ORDER — SODIUM CHLORIDE 0.9 % IV SOLN
250.0000 mL | Freq: Once | INTRAVENOUS | Status: AC
  Administered 2014-05-19: 250 mL via INTRAVENOUS

## 2014-05-19 MED ORDER — HEPARIN SOD (PORK) LOCK FLUSH 100 UNIT/ML IV SOLN
500.0000 [IU] | Freq: Every day | INTRAVENOUS | Status: AC | PRN
  Administered 2014-05-19: 500 [IU]
  Filled 2014-05-19: qty 5

## 2014-05-19 MED ORDER — OMEPRAZOLE 40 MG PO CPDR: 40.0000 mg | DELAYED_RELEASE_CAPSULE | Freq: Every day | ORAL | Status: DC

## 2014-05-19 MED ORDER — DIPHENHYDRAMINE HCL 25 MG PO CAPS
ORAL_CAPSULE | ORAL | Status: AC
  Filled 2014-05-19: qty 1

## 2014-05-19 MED ORDER — FUROSEMIDE 10 MG/ML IJ SOLN
20.0000 mg | Freq: Once | INTRAMUSCULAR | Status: AC
  Administered 2014-05-19: 20 mg via INTRAVENOUS

## 2014-05-19 MED ORDER — ACETAMINOPHEN 325 MG PO TABS
650.0000 mg | ORAL_TABLET | Freq: Once | ORAL | Status: AC
  Administered 2014-05-19: 650 mg via ORAL

## 2014-05-19 NOTE — Patient Instructions (Signed)
Blood Transfusion Information WHAT IS A BLOOD TRANSFUSION? A transfusion is the replacement of blood or some of its parts. Blood is made up of multiple cells which provide different functions.  Red blood cells carry oxygen and are used for blood loss replacement.  White blood cells fight against infection.  Platelets control bleeding.  Plasma helps clot blood.  Other blood products are available for specialized needs, such as hemophilia or other clotting disorders. BEFORE THE TRANSFUSION  Who gives blood for transfusions?   You may be able to donate blood to be used at a later date on yourself (autologous donation).  Relatives can be asked to donate blood. This is generally not any safer than if you have received blood from a stranger. The same precautions are taken to ensure safety when a relative's blood is donated.  Healthy volunteers who are fully evaluated to make sure their blood is safe. This is blood bank blood. Transfusion therapy is the safest it has ever been in the practice of medicine. Before blood is taken from a donor, a complete history is taken to make sure that person has no history of diseases nor engages in risky social behavior (examples are intravenous drug use or sexual activity with multiple partners). The donor's travel history is screened to minimize risk of transmitting infections, such as malaria. The donated blood is tested for signs of infectious diseases, such as HIV and hepatitis. The blood is then tested to be sure it is compatible with you in order to minimize the chance of a transfusion reaction. If you or a relative donates blood, this is often done in anticipation of surgery and is not appropriate for emergency situations. It takes many days to process the donated blood. RISKS AND COMPLICATIONS Although transfusion therapy is very safe and saves many lives, the main dangers of transfusion include:   Getting an infectious disease.  Developing a  transfusion reaction. This is an allergic reaction to something in the blood you were given. Every precaution is taken to prevent this. The decision to have a blood transfusion has been considered carefully by your caregiver before blood is given. Blood is not given unless the benefits outweigh the risks. AFTER THE TRANSFUSION  Right after receiving a blood transfusion, you will usually feel much better and more energetic. This is especially true if your red blood cells have gotten low (anemic). The transfusion raises the level of the red blood cells which carry oxygen, and this usually causes an energy increase.  The nurse administering the transfusion will monitor you carefully for complications. HOME CARE INSTRUCTIONS  No special instructions are needed after a transfusion. You may find your energy is better. Speak with your caregiver about any limitations on activity for underlying diseases you may have. SEEK MEDICAL CARE IF:   Your condition is not improving after your transfusion.  You develop redness or irritation at the intravenous (IV) site. SEEK IMMEDIATE MEDICAL CARE IF:  Any of the following symptoms occur over the next 12 hours:  Shaking chills.  You have a temperature by mouth above 102 F (38.9 C), not controlled by medicine.  Chest, back, or muscle pain.  People around you feel you are not acting correctly or are confused.  Shortness of breath or difficulty breathing.  Dizziness and fainting.  You get a rash or develop hives.  You have a decrease in urine output.  Your urine turns a dark color or changes to pink, red, or brown. Any of the following   symptoms occur over the next 10 days:  You have a temperature by mouth above 102 F (38.9 C), not controlled by medicine.  Shortness of breath.  Weakness after normal activity.  The white part of the eye turns yellow (jaundice).  You have a decrease in the amount of urine or are urinating less often.  Your  urine turns a dark color or changes to pink, red, or brown. Document Released: 12/06/2000 Document Revised: 03/02/2012 Document Reviewed: 07/25/2008 [facility] Patient Information 2014 [place]. Omeprazole capsules (sprinkle caps) - Rx What is this medicine? OMEPRAZOLE (oh ME pray zol) prevents the production of acid in the stomach. It is used to treat gastroesophageal reflux disease (GERD), ulcers, certain bacteria in the stomach, inflammation of the esophagus, and Zollinger-Ellison Syndrome. It is also used to treat other conditions that cause too much stomach acid. This medicine may be used for other purposes; ask your health care provider or pharmacist if you have questions. COMMON BRAND NAME(S): Prilosec What should I tell my health care provider before I take this medicine? They need to know if you have any of these conditions: -liver disease -low levels of magnesium in the blood -an unusual or allergic reaction to omeprazole, other medicines, foods, dyes, or preservatives -pregnant or trying to get pregnant -breast-feeding How should I use this medicine? Take this medicine by mouth with a glass of water. Follow the directions on the prescription label. Do not crush, break or chew the capsules. They can be opened and the contents sprinkled on a small amount of applesauce or yogurt, given with fruit juices, or swallowed immediately with water. This medicine works best if taken on an empty stomach 30 to 60 minutes before breakfast. Take your doses at regular intervals. Do not take your medicine more often than directed. Talk to your pediatrician regarding the use of this medicine in children. Special care may be needed. Overdosage: If you think you have taken too much of this medicine contact a poison control center or emergency room at once. NOTE: This medicine is only for you. Do not share this medicine with others. What if I miss a dose? If you miss a dose, take it as soon as you  can. If it is almost time for your next dose, take only that dose. Do not take double or extra doses. What may interact with this medicine? Do not take this medicine with any of the following medications: -atazanavir -clopidogrel -nelfinavir This medicine may also interact with the following medications: -ampicillin -certain medicines for anxiety or sleep -certain medicines that treat or prevent blood clots like warfarin -cyclosporine -diazepam -digoxin -disulfiram -diuretics -iron salts -phenytoin -prescription medicine for fungal or yeast infection like itraconazole, ketoconazole, voriconazole -saquinavir -tacrolimus This list may not describe all possible interactions. Give your health care provider a list of all the medicines, herbs, non-prescription drugs, or dietary supplements you use. Also tell them if you smoke, drink alcohol, or use illegal drugs. Some items may interact with your medicine. What should I watch for while using this medicine? It can take several days before your stomach pain gets better. Check with your doctor or health care professional if your condition does not start to get better, or if it gets worse. You may need blood work done while you are taking this medicine. What side effects may I notice from receiving this medicine? Side effects that you should report to your doctor or health care professional as soon as possible: -allergic reactions like skin rash,  itching or hives, swelling of the face, lips, or tongue -bone, muscle or joint pain -breathing problems -chest pain or chest tightness -dark yellow or brown urine -dizziness -fast, irregular heartbeat -feeling faint or lightheaded -fever or sore throat -muscle spasm -palpitations -redness, blistering, peeling or loosening of the skin, including inside the mouth -seizures -tremors -unusual bleeding or bruising -unusually weak or tired -yellowing of the eyes or skin Side effects that usually  do not require medical attention (Report these to your doctor or health care professional if they continue or are bothersome.): -constipation -diarrhea -dry mouth -headache -nausea This list may not describe all possible side effects. Call your doctor for medical advice about side effects. You may report side effects to FDA at 1-800-FDA-1088. Where should I keep my medicine? Keep out of the reach of children. Store at room temperature between 15 and 30 degrees C (59 and 86 degrees F). Protect from light and moisture. Throw away any unused medicine after the expiration date. NOTE: This sheet is a summary. It may not cover all possible information. If you have questions about this medicine, talk to your doctor, pharmacist, or health care provider.  2014, [employer]vier/[employer] Standard. (2010-02-27 11:28:20)

## 2014-05-19 NOTE — Telephone Encounter (Signed)
Message copied by Harmon Pier on Thu May 19, 2014  5:19 PM ------      Message from: Minette Headland      Created: Thu May 19, 2014  4:31 PM       Patients potassium low again.  Please call in her potassium liquid, 42meq po daily x 7 days.            Thanks, LC      ----- Message -----         From: [facility] Interface         Sent: 05/19/2014   9:32 AM           To: [name], MD                   ------

## 2014-05-19 NOTE — Progress Notes (Signed)
Hematology and Oncology Follow Up Visit  Madeline Davidson [id number] 1956-03-19 58 y.o. 05/22/2014 10:37 PM     Principle Diagnosis:Lexus Dripps 58 y.o. 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   Prior Therapy:  #1 Patient on 12/16/2013 felt a lump in her right breast. She states it was nickel size. Because of this she 1 on to see her nurse practitioner. On 12/31/2013 a mammogram was ordered. This showed a lesion in the 7:00 position measuring 3.3 cm. She was also noted to have enlarged lymph nodes. Subsequent ultrasound showed a 2.2 x 2.4 cm mass with enlarged lymph node measuring 1.6 cm. This was biopsied in [place]. 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 Lymph node was positive for metastatic disease. Prognostic markers revealed the tumor to be estrogen receptor +90% progesterone receptor 1% HER-2/neu positive   #2 staging PET/CT performed on 89/38/1017:  Hypermetabolic mass posterior right breast with SUV max 13.6 and  measuring 13.2 cm (image 108). There two 12 in 16 mm hypermetabolic  nodules in the posterior right breast versus lymph nodes with  intense metabolic activity SUV max 10.9. No hypermetabolic  supraclavicular internal mammary nodes. No hypermetabolic  mediastinal lymph nodes. No suspicious pulmonary nodules.  ABDOMEN/PELVIS  There are 3 hypermetabolic periportal lymph nodes. The largest  measures 20 mm with SUV max 13.6. Additional small less than 10 mm  short axis nodes on image 144 and 130 also have intense metabolic  activity. No abnormal metabolic activity within the liver. No  hypermetabolic abdominal pelvic lymph nodes.   Current therapy:  Taxotere, Carboplatin, Herceptin, Perjeta cycle 6 day 1  Interim History: [name] 58 y.o. 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  She is here to be evaluated  prior to receiving cycle 6 of 6 planned treatments of Taxotere, Carboplatin, Herceptin, and Perjeta.  She is receiving this treatment in the neaodjuvant setting on day 1 of a 21 day cycle with Neulasta support on day 2.    The patient is here on cycle 6 day 1 of treatment.  She is doing well today.  On cycle 5 day 8 of chemotherapy she was admitted to the hospital due to neutropenia and hypotension.  She has since recovered and is here for her sixth and final cycle of treatment.  She is doing well today.  She does have some tachycardia, occasional palpitations and mild DOE.  Her numbness is unchanged from last week.  She is taking Super B Complex daily.  She is also c/o indigestion and reflux.  Otherwise, she denies fevers, chills, nausea, vomiting, constipation, diarrhea, headaches, dizziness or any further concerns.   Medications:  Current Outpatient Prescriptions  Medication Sig Dispense Refill  . dexamethasone (DECADRON) 4 MG tablet Take 2 tablets (8 mg total) by mouth 2 (two) times daily with a meal. Take two times a day the day before Taxotere. Then take two times a day starting the day after chemo for 3 days.  30 tablet  1  . lidocaine-prilocaine (EMLA) cream Apply topically as needed.  30 g  6  . PRESCRIPTION MEDICATION DOCEtaxel (TAXOTERE) 150 mg in dextrose 5 % 250 mL chemo infusion 75 mg/m2  2.06 m2 (Treatment Plan Actual)  Once 04/28/2014      . PRESCRIPTION MEDICATION CARBOplatin (PARAPLATIN) 700 mg in sodium chloride 0.9 % 250 mL chemo infusion 700 mg  Once 04/28/2014      .  PRESCRIPTION MEDICATION trastuzumab (HERCEPTIN) 567 mg in sodium chloride 0.9 % 250 mL chemo infusion 6 mg/kg  94.2 kg (Treatment Plan Actual)  Once 04/28/2014      . PRESCRIPTION MEDICATION pertuzumab (PERJETA) 420 mg in sodium chloride 0.9 % 250 mL chemo infusion 420 mg Once 04/28/2014      . venlafaxine (EFFEXOR) 37.5 MG tablet Take 1 tablet (37.5 mg total) by mouth 2 (two) times daily.  60 tablet  6  . B Complex-C  (SUPER B COMPLEX PO) Take 1 tablet by mouth every morning.      Marland Kitchen ibuprofen (ADVIL,MOTRIN) 800 MG tablet Take 800 mg by mouth as needed for moderate pain.       Marland Kitchen loratadine (CLARITIN) 10 MG tablet Take 10 mg by mouth daily.      Marland Kitchen omeprazole (PRILOSEC) 40 MG capsule Take 1 capsule (40 mg total) by mouth daily.  30 capsule  6  . ondansetron (ZOFRAN) 8 MG tablet Take 1 tablet (8 mg total) by mouth 2 (two) times daily. Take two times a day starting the day after chemo for 3 days. Then take two times a day as needed for nausea or vomiting.  30 tablet  1  . prochlorperazine (COMPAZINE) 10 MG tablet Take 1 tablet (10 mg total) by mouth every 6 (six) hours as needed (Nausea or vomiting).  30 tablet  1  . prochlorperazine (COMPAZINE) 25 MG suppository Place 1 suppository (25 mg total) rectally every 12 (twelve) hours as needed for nausea or vomiting.  12 suppository  0  . zolpidem (AMBIEN CR) 12.5 MG CR tablet Take 1 tablet (12.5 mg total) by mouth at bedtime as needed for sleep.  30 tablet  0   No current facility-administered medications for this visit.   Facility-Administered Medications Ordered in Other Visits  Medication Dose Route Frequency Provider Last Rate Last Dose  . sodium chloride 0.9 % injection 10 mL  10 mL Intracatheter PRN [name], MD   10 mL at 02/03/14 1759     Allergies:  Allergies  Allergen Reactions  . Ciprofloxacin Other (See Comments)    Patient had a headache after taking Cipro. Made her sinus infection symptoms worse.    Medical History: Past Medical History  Diagnosis Date  . 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 Hypertension   . Allergy   . Hypertension 01/10/2014  . Wears glasses     Surgical History:  Past Surgical History  Procedure Laterality Date  . Colon resection  2004  . Hernia repair  2005    umb  . Cholecystectomy  2005  . Portacath placement Left 01/31/2014    Procedure: INSERTION  PORT-A-CATH;  Surgeon: Rolm Bookbinder, MD;  Location: [facility];  Service: General;  Laterality: Left;     Review of Systems: A 10 point review of systems was conducted and is otherwise negative except for what is noted above.    Physical Exam: Blood pressure 135/82, pulse 120, temperature 99 F (37.2 C), temperature source Oral, resp. rate 18, height _0  (1.626 m), weight 189 lb 11.2 oz (86.047 kg). GENERAL: Patient is an acutely ill appearing female HEENT:  Sclerae anicteric.  Oropharynx clear and moist. No ulcerations or evidence of oropharyngeal candidiasis. Neck is supple.  NODES:  No cervical, supraclavicular, or axillary lymphadenopathy palpated.  BREAST EXAM:  Deferred. LUNGS:  Clear to auscultation bilaterally.  No wheezes or rhonchi. HEART:  Regular  Rhythm, tachycardic. No murmur appreciated. ABDOMEN:  Soft, nontender.  Positive, normoactive bowel sounds. No organomegaly palpated. MSK:  No focal spinal tenderness to palpation. Full range of motion bilaterally in the upper extremities. EXTREMITIES:  No peripheral edema.   SKIN:  Clear with no obvious rashes or skin changes. No nail dyscrasia. NEURO:  Nonfocal. Well oriented.  Appropriate affect. ECOG PERFORMANCE STATUS: 1 - Symptomatic but completely ambulatory  Lab Results: Lab Results  Component Value Date   WBC 12.3* 05/19/2014   HGB 7.0 Repeated and Verified* 05/19/2014   HCT 21.2* 05/19/2014   MCV 101.3* 05/19/2014   PLT 375 05/19/2014     Chemistry      Component Value Date/Time   NA 142 05/19/2014 0915   NA 136* 05/06/2014 0440   K 3.1* 05/19/2014 0915   K 3.5* 05/06/2014 0440   CL 102 05/06/2014 0440   CO2 25 05/19/2014 0915   CO2 22 05/06/2014 0440   BUN 18.0 05/19/2014 0915   BUN 18 05/06/2014 0440   CREATININE 1.1 05/19/2014 0915   CREATININE 0.88 05/06/2014 0440      Component Value Date/Time   CALCIUM 9.7 05/19/2014 0915   CALCIUM 8.2* 05/06/2014 0440   ALKPHOS 101 05/19/2014 0915   AST 16 05/19/2014 0915   ALT  17 05/19/2014 0915   BILITOT 0.43 05/19/2014 0915     Assessment and Plan: Kerriann Kamphuis 58 y.o. female with  1.Stage IIB ER positive, HER-2/neu positive infiltrating ductal carcinoma of the right breast. Please see prior history above.  She is cycle 6 day 1 of 6 planned treatments with Taxotere, Carboplatin, Herceptin, and Perjeta.  She will proceed with this tomorrow.  I have discussed her case with Dr. Jana Hakim and Dr. Donne Hazel.  At the beginning of her treatment she had a PET/CT scan that demonstrated hypermetabolic nodes at her porta hepatis.  Dr. Humphrey Rolls had noted possibly biopsying them.  The patient did not undergo a biopsy.  I will re-order a PET/CT today to evaluate her response to chemotherapy.    2.  Anemia:  The patient has a hemoglobin of 7 today and has symptoms of anemia.  I recommended she receive 2 units of PRBCs today and return tomorrow for treatment.  I reviewed the risks and benefits of a blood transfusion with her in detail and she did consent to treatment.    3. Hypokalemia.  The patient was prescribed potassium and will take BID.    4. Neuropathy: This is improved with Super B complex daily.  The patient will continue taking this.    5. Insomnia.  The patient will continue taking Ambien CR.  She is sleeping well.  We refilled this today.    6.  Reflux:  I prescribed Omeprazole daily for her to take.     The patient will return tomorrow for treatment, Saturday, May 30 for Neulasta, and in one week for labs and evaluation.  She knows to call us in the interim for any questions or concerns.  We can certainly see her sooner if needed.  I spent 25 minutes counseling the patient face to face.  The total time spent in the appointment was 30 minutes.  Minette Headland, [place] [phone] 05/22/2014 10:37 PM

## 2014-05-19 NOTE — Telephone Encounter (Signed)
Called pt to inform her to pick up Rx's at pharmacy. Communicated to pt about her potassium level(3.1) and I've called potassium to her pharmacy. Explained to her she is to take 20 mEq daily x 7 days. Also she has a refill for Ambien 12.5 mg at pharmacy as well per Charlestine Massed, NP. Message to be forwarded to Charlestine Massed, NP.

## 2014-05-19 NOTE — Patient Instructions (Signed)
Blood Transfusion Information WHAT IS A BLOOD TRANSFUSION? A transfusion is the replacement of blood or some of its parts. Blood is made up of multiple cells which provide different functions.  Red blood cells carry oxygen and are used for blood loss replacement.  White blood cells fight against infection.  Platelets control bleeding.  Plasma helps clot blood.  Other blood products are available for specialized needs, such as hemophilia or other clotting disorders. BEFORE THE TRANSFUSION  Who gives blood for transfusions?   You may be able to donate blood to be used at a later date on yourself (autologous donation).  Relatives can be asked to donate blood. This is generally not any safer than if you have received blood from a stranger. The same precautions are taken to ensure safety when a relative's blood is donated.  Healthy volunteers who are fully evaluated to make sure their blood is safe. This is blood bank blood. Transfusion therapy is the safest it has ever been in the practice of medicine. Before blood is taken from a donor, a complete history is taken to make sure that person has no history of diseases nor engages in risky social behavior (examples are intravenous drug use or sexual activity with multiple partners). The donor's travel history is screened to minimize risk of transmitting infections, such as malaria. The donated blood is tested for signs of infectious diseases, such as HIV and hepatitis. The blood is then tested to be sure it is compatible with you in order to minimize the chance of a transfusion reaction. If you or a relative donates blood, this is often done in anticipation of surgery and is not appropriate for emergency situations. It takes many days to process the donated blood. RISKS AND COMPLICATIONS Although transfusion therapy is very safe and saves many lives, the main dangers of transfusion include:   Getting an infectious disease.  Developing a  transfusion reaction. This is an allergic reaction to something in the blood you were given. Every precaution is taken to prevent this. The decision to have a blood transfusion has been considered carefully by your caregiver before blood is given. Blood is not given unless the benefits outweigh the risks. AFTER THE TRANSFUSION  Right after receiving a blood transfusion, you will usually feel much better and more energetic. This is especially true if your red blood cells have gotten low (anemic). The transfusion raises the level of the red blood cells which carry oxygen, and this usually causes an energy increase.  The nurse administering the transfusion will monitor you carefully for complications. HOME CARE INSTRUCTIONS  No special instructions are needed after a transfusion. You may find your energy is better. Speak with your caregiver about any limitations on activity for underlying diseases you may have. SEEK MEDICAL CARE IF:   Your condition is not improving after your transfusion.  You develop redness or irritation at the intravenous (IV) site. SEEK IMMEDIATE MEDICAL CARE IF:  Any of the following symptoms occur over the next 12 hours:  Shaking chills.  You have a temperature by mouth above 102 F (38.9 C), not controlled by medicine.  Chest, back, or muscle pain.  People around you feel you are not acting correctly or are confused.  Shortness of breath or difficulty breathing.  Dizziness and fainting.  You get a rash or develop hives.  You have a decrease in urine output.  Your urine turns a dark color or changes to pink, red, or brown. Any of the following   symptoms occur over the next 10 days:  You have a temperature by mouth above 102 F (38.9 C), not controlled by medicine.  Shortness of breath.  Weakness after normal activity.  The white part of the eye turns yellow (jaundice).  You have a decrease in the amount of urine or are urinating less often.  Your  urine turns a dark color or changes to pink, red, or brown. Document Released: 12/06/2000 Document Revised: 03/02/2012 Document Reviewed: 07/25/2008 [employer] Patient Information 2014 [employer].  

## 2014-05-20 ENCOUNTER — Other Ambulatory Visit: Payer: Self-pay | Admitting: *Deleted

## 2014-05-20 ENCOUNTER — Ambulatory Visit: Payer: BC Managed Care – PPO

## 2014-05-20 ENCOUNTER — Other Ambulatory Visit: Payer: Self-pay | Admitting: Oncology

## 2014-05-20 ENCOUNTER — Ambulatory Visit (HOSPITAL_BASED_OUTPATIENT_CLINIC_OR_DEPARTMENT_OTHER): Payer: BC Managed Care – PPO

## 2014-05-20 VITALS — BP 137/74 | HR 86 | Temp 98.9°F | Resp 18

## 2014-05-20 DIAGNOSIS — Z5111 Encounter for antineoplastic chemotherapy: Secondary | ICD-10-CM

## 2014-05-20 DIAGNOSIS — C50919 Malignant neoplasm of unspecified site of unspecified female breast: Secondary | ICD-10-CM

## 2014-05-20 DIAGNOSIS — C50519 Malignant neoplasm of lower-outer quadrant of unspecified female breast: Secondary | ICD-10-CM

## 2014-05-20 DIAGNOSIS — Z5112 Encounter for antineoplastic immunotherapy: Secondary | ICD-10-CM

## 2014-05-20 LAB — TYPE AND SCREEN
ABO/RH(D): A POS
Antibody Screen: NEGATIVE
Unit division: 0
Unit division: 0

## 2014-05-20 MED ORDER — SODIUM CHLORIDE 0.9 % IV SOLN
420.0000 mg | Freq: Once | INTRAVENOUS | Status: AC
  Administered 2014-05-20: 420 mg via INTRAVENOUS
  Filled 2014-05-20: qty 14

## 2014-05-20 MED ORDER — ACETAMINOPHEN 325 MG PO TABS
ORAL_TABLET | ORAL | Status: AC
  Filled 2014-05-20: qty 2

## 2014-05-20 MED ORDER — HEPARIN SOD (PORK) LOCK FLUSH 100 UNIT/ML IV SOLN
500.0000 [IU] | Freq: Once | INTRAVENOUS | Status: AC | PRN
  Administered 2014-05-20: 500 [IU]
  Filled 2014-05-20: qty 5

## 2014-05-20 MED ORDER — ONDANSETRON 16 MG/50ML IVPB (CHCC)
INTRAVENOUS | Status: AC
  Filled 2014-05-20: qty 16

## 2014-05-20 MED ORDER — DIPHENHYDRAMINE HCL 25 MG PO CAPS
ORAL_CAPSULE | ORAL | Status: AC
  Filled 2014-05-20: qty 2

## 2014-05-20 MED ORDER — DEXAMETHASONE SODIUM PHOSPHATE 20 MG/5ML IJ SOLN
INTRAMUSCULAR | Status: AC
  Filled 2014-05-20: qty 5

## 2014-05-20 MED ORDER — DEXAMETHASONE SODIUM PHOSPHATE 20 MG/5ML IJ SOLN
20.0000 mg | Freq: Once | INTRAMUSCULAR | Status: AC
  Administered 2014-05-20: 20 mg via INTRAVENOUS

## 2014-05-20 MED ORDER — ACETAMINOPHEN 325 MG PO TABS
650.0000 mg | ORAL_TABLET | Freq: Once | ORAL | Status: AC
  Administered 2014-05-20: 650 mg via ORAL

## 2014-05-20 MED ORDER — DIPHENHYDRAMINE HCL 25 MG PO CAPS
50.0000 mg | ORAL_CAPSULE | Freq: Once | ORAL | Status: AC
  Administered 2014-05-20: 50 mg via ORAL

## 2014-05-20 MED ORDER — DEXTROSE 5 % IV SOLN
75.0000 mg/m2 | Freq: Once | INTRAVENOUS | Status: AC
  Administered 2014-05-20: 150 mg via INTRAVENOUS
  Filled 2014-05-20: qty 15

## 2014-05-20 MED ORDER — SODIUM CHLORIDE 0.9 % IV SOLN
Freq: Once | INTRAVENOUS | Status: AC
  Administered 2014-05-20: 13:00:00 via INTRAVENOUS

## 2014-05-20 MED ORDER — ONDANSETRON 16 MG/50ML IVPB (CHCC)
16.0000 mg | Freq: Once | INTRAVENOUS | Status: AC
  Administered 2014-05-20: 16 mg via INTRAVENOUS

## 2014-05-20 MED ORDER — TRASTUZUMAB CHEMO INJECTION 440 MG
6.0000 mg/kg | Freq: Once | INTRAVENOUS | Status: AC
  Administered 2014-05-20: 567 mg via INTRAVENOUS
  Filled 2014-05-20: qty 27

## 2014-05-20 MED ORDER — SODIUM CHLORIDE 0.9 % IJ SOLN
10.0000 mL | INTRAMUSCULAR | Status: DC | PRN
  Administered 2014-05-20: 10 mL
  Filled 2014-05-20: qty 10

## 2014-05-20 MED ORDER — SODIUM CHLORIDE 0.9 % IV SOLN
653.4000 mg | Freq: Once | INTRAVENOUS | Status: AC
  Administered 2014-05-20: 650 mg via INTRAVENOUS
  Filled 2014-05-20: qty 65

## 2014-05-20 NOTE — Patient Instructions (Addendum)
[facility] Discharge Instructions for Patients Receiving Chemotherapy  Today you received the following chemotherapy agents Taxotere/Carboplatin/Herceptin/Perjeta.  To help prevent nausea and vomiting after your treatment, we encourage you to take your nausea medication as prescribed.   If you develop nausea and vomiting that is not controlled by your nausea medication, call the clinic.   BELOW ARE SYMPTOMS THAT SHOULD BE REPORTED IMMEDIATELY:  *FEVER GREATER THAN 100.5 F  *CHILLS WITH OR WITHOUT FEVER  NAUSEA AND VOMITING THAT IS NOT CONTROLLED WITH YOUR NAUSEA MEDICATION  *UNUSUAL SHORTNESS OF BREATH  *UNUSUAL BRUISING OR BLEEDING  TENDERNESS IN MOUTH AND THROAT WITH OR WITHOUT PRESENCE OF ULCERS  *URINARY PROBLEMS  *BOWEL PROBLEMS  UNUSUAL RASH Items with * indicate a potential emergency and should be followed up as soon as possible.  Feel free to call the clinic you have any questions or concerns. The clinic phone number is [phone].    

## 2014-05-21 ENCOUNTER — Ambulatory Visit (HOSPITAL_BASED_OUTPATIENT_CLINIC_OR_DEPARTMENT_OTHER): Payer: BC Managed Care – PPO

## 2014-05-21 VITALS — BP 144/83 | HR 88 | Temp 98.4°F

## 2014-05-21 DIAGNOSIS — C50519 Malignant neoplasm of lower-outer quadrant of unspecified female breast: Secondary | ICD-10-CM

## 2014-05-21 DIAGNOSIS — Z5189 Encounter for other specified aftercare: Secondary | ICD-10-CM

## 2014-05-21 MED ORDER — PEGFILGRASTIM INJECTION 6 MG/0.6ML
6.0000 mg | Freq: Once | SUBCUTANEOUS | Status: AC
  Administered 2014-05-21: 6 mg via SUBCUTANEOUS

## 2014-05-21 NOTE — Patient Instructions (Signed)

## 2014-05-24 ENCOUNTER — Telehealth: Payer: Self-pay | Admitting: Adult Health

## 2014-05-24 NOTE — Telephone Encounter (Signed)
CENTRAL TO CONTACT PT RE PET/CT. NO OTHER ORDERS PER 5//30 POF.

## 2014-05-26 ENCOUNTER — Other Ambulatory Visit (HOSPITAL_BASED_OUTPATIENT_CLINIC_OR_DEPARTMENT_OTHER): Payer: BC Managed Care – PPO

## 2014-05-26 ENCOUNTER — Ambulatory Visit (HOSPITAL_BASED_OUTPATIENT_CLINIC_OR_DEPARTMENT_OTHER): Payer: BC Managed Care – PPO | Admitting: Adult Health

## 2014-05-26 VITALS — BP 140/56 | HR 101 | Temp 98.9°F | Resp 18 | Ht 64.0 in | Wt 189.6 lb

## 2014-05-26 DIAGNOSIS — E876 Hypokalemia: Secondary | ICD-10-CM

## 2014-05-26 DIAGNOSIS — C50319 Malignant neoplasm of lower-inner quadrant of unspecified female breast: Secondary | ICD-10-CM

## 2014-05-26 DIAGNOSIS — C50519 Malignant neoplasm of lower-outer quadrant of unspecified female breast: Secondary | ICD-10-CM

## 2014-05-26 DIAGNOSIS — C50919 Malignant neoplasm of unspecified site of unspecified female breast: Secondary | ICD-10-CM

## 2014-05-26 DIAGNOSIS — K219 Gastro-esophageal reflux disease without esophagitis: Secondary | ICD-10-CM

## 2014-05-26 DIAGNOSIS — G609 Hereditary and idiopathic neuropathy, unspecified: Secondary | ICD-10-CM

## 2014-05-26 DIAGNOSIS — G4700 Insomnia, unspecified: Secondary | ICD-10-CM

## 2014-05-26 LAB — CBC WITH DIFFERENTIAL/PLATELET
BASO%: 0.4 % (ref 0.0–2.0)
Basophils Absolute: 0 10*3/uL (ref 0.0–0.1)
EOS%: 0.3 % (ref 0.0–7.0)
Eosinophils Absolute: 0 10*3/uL (ref 0.0–0.5)
HCT: 31.1 % — ABNORMAL LOW (ref 34.8–46.6)
HGB: 10.3 g/dL — ABNORMAL LOW (ref 11.6–15.9)
LYMPH%: 38.1 % (ref 14.0–49.7)
MCH: 31.8 pg (ref 25.1–34.0)
MCHC: 33.1 g/dL (ref 31.5–36.0)
MCV: 96 fL (ref 79.5–101.0)
MONO#: 0.4 10*3/uL (ref 0.1–0.9)
MONO%: 19.4 % — AB (ref 0.0–14.0)
NEUT#: 0.9 10*3/uL — ABNORMAL LOW (ref 1.5–6.5)
NEUT%: 41.8 % (ref 38.4–76.8)
PLATELETS: 245 10*3/uL (ref 145–400)
RBC: 3.24 10*6/uL — AB (ref 3.70–5.45)
RDW: 19.9 % — ABNORMAL HIGH (ref 11.2–14.5)
WBC: 2.2 10*3/uL — AB (ref 3.9–10.3)
lymph#: 0.9 10*3/uL (ref 0.9–3.3)

## 2014-05-26 LAB — COMPREHENSIVE METABOLIC PANEL (CC13)
ALK PHOS: 109 U/L (ref 40–150)
ALT: 29 U/L (ref 0–55)
AST: 17 U/L (ref 5–34)
Albumin: 3.4 g/dL — ABNORMAL LOW (ref 3.5–5.0)
Anion Gap: 16 mEq/L — ABNORMAL HIGH (ref 3–11)
BILIRUBIN TOTAL: 0.61 mg/dL (ref 0.20–1.20)
BUN: 16.4 mg/dL (ref 7.0–26.0)
CO2: 22 mEq/L (ref 22–29)
Calcium: 8.9 mg/dL (ref 8.4–10.4)
Chloride: 105 mEq/L (ref 98–109)
Creatinine: 0.8 mg/dL (ref 0.6–1.1)
Glucose: 104 mg/dl (ref 70–140)
Potassium: 3.2 mEq/L — ABNORMAL LOW (ref 3.5–5.1)
SODIUM: 144 meq/L (ref 136–145)
TOTAL PROTEIN: 6.1 g/dL — AB (ref 6.4–8.3)

## 2014-05-26 NOTE — Progress Notes (Signed)
Hematology and Oncology Follow Up Visit  Madeline Davidson [id number] 02/05/1956 58 y.o. 05/30/2014 3:01 PM     Principle Diagnosis:Madeline Davidson 58 y.o. 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   Prior Therapy:  #1 Patient on 12/16/2013 felt a lump in her right breast. She states it was nickel size. Because of this she 1 on to see her nurse practitioner. On 12/31/2013 a mammogram was ordered. This showed a lesion in the 7:00 position measuring 3.3 cm. She was also noted to have enlarged lymph nodes. Subsequent ultrasound showed a 2.2 x 2.4 cm mass with enlarged lymph node measuring 1.6 cm. This was biopsied in [place]. 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 Lymph node was positive for metastatic disease. Prognostic markers revealed the tumor to be estrogen receptor +90% progesterone receptor 1% HER-2/neu positive   #2 staging PET/CT performed on 01/20/2014:  Hypermetabolic mass posterior right breast with SUV max 13.6 and  measuring 13.2 cm (image 108). There two 12 in 16 mm hypermetabolic  nodules in the posterior right breast versus lymph nodes with  intense metabolic activity SUV max 10.9. No hypermetabolic  supraclavicular internal mammary nodes. No hypermetabolic  mediastinal lymph nodes. No suspicious pulmonary nodules.  ABDOMEN/PELVIS  There are 3 hypermetabolic periportal lymph nodes. The largest  measures 20 mm with SUV max 13.6. Additional small less than 10 mm  short axis nodes on image 144 and 130 also have intense metabolic  activity. No abnormal metabolic activity within the liver. No  hypermetabolic abdominal pelvic lymph nodes.   Current therapy:  Taxotere, Carboplatin, Herceptin, Perjeta cycle 6 day 8  Interim History: Madeline Davidson 58 y.o. 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  She is here to be evaluated  after receiving cycle 6 of 6 planned treatments of Taxotere, Carboplatin, Herceptin, and Perjeta.  She is receiving this treatment in the neaodjuvant setting on day 1 of a 21 day cycle with Neulasta support on day 2.    The patient is here on cycle 6 day 8 of chemotherapy.  She is relieved to have completed treatment.  She has a PET/CT scheduled on 06/01/14 and f/u with Dr. Dwain Sarna on 05/31/14 for surgical planning.  She denies any fevers, chills, nausea, vomiting, constipation, diarrhea, numbness/tingling, or any further concerns.    Medications:  Current Outpatient Prescriptions  Medication Sig Dispense Refill  . B Complex-C (SUPER B COMPLEX PO) Take 1 tablet by mouth every morning.      . loratadine (CLARITIN) 10 MG tablet Take 10 mg by mouth daily.      Marland Kitchen omeprazole (PRILOSEC) 40 MG capsule Take 1 capsule (40 mg total) by mouth daily.  30 capsule  6  . zolpidem (AMBIEN CR) 12.5 MG CR tablet Take 1 tablet (12.5 mg total) by mouth at bedtime as needed for sleep.  30 tablet  0  . ibuprofen (ADVIL,MOTRIN) 800 MG tablet Take 800 mg by mouth as needed for moderate pain.       Marland Kitchen lidocaine-prilocaine (EMLA) cream Apply topically as needed.  30 g  6  . potassium chloride 20 MEQ/15ML (10%) solution       . PRESCRIPTION MEDICATION CARBOplatin (PARAPLATIN) 700 mg in sodium chloride 0.9 % 250 mL chemo infusion 700 mg  Once 04/28/2014      . PRESCRIPTION MEDICATION pertuzumab (PERJETA) 420 mg in sodium chloride 0.9 % 250 mL chemo infusion 420 mg  Once 04/28/2014      . prochlorperazine (COMPAZINE) 10 MG tablet Take 1 tablet (10 mg total) by mouth every 6 (six) hours as needed (Nausea or vomiting).  30 tablet  1  . prochlorperazine (COMPAZINE) 25 MG suppository Place 1 suppository (25 mg total) rectally every 12 (twelve) hours as needed for nausea or vomiting.  12 suppository  0  . venlafaxine (EFFEXOR) 37.5 MG tablet Take 1 tablet (37.5 mg total) by mouth 2 (two) times daily.  60 tablet  6   No current  facility-administered medications for this visit.     Allergies:  Allergies  Allergen Reactions  . Ciprofloxacin Other (See Comments)    Patient had a headache after taking Cipro. Made her sinus infection symptoms worse.    Medical History: Past Medical History  Diagnosis Date  . 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 Hypertension   . Allergy   . Hypertension 01/10/2014  . Wears glasses     Surgical History:  Past Surgical History  Procedure Laterality Date  . Colon resection  2004  . Hernia repair  2005    umb  . Cholecystectomy  2005  . Portacath placement Left 01/31/2014    Procedure: INSERTION PORT-A-CATH;  Surgeon: Rolm Bookbinder, MD;  Location: [facility];  Service: General;  Laterality: Left;     Review of Systems: A 10 point review of systems was conducted and is otherwise negative except for what is noted above.    Physical Exam: Blood pressure 140/56, pulse 101, temperature 98.9 F (37.2 C), temperature source Oral, resp. rate 18, height $RemoveBe'5\' 4"'QHSEBXkov$  (1.626 m), weight 189 lb 9.6 oz (86.002 kg). GENERAL: Patient is an acutely ill appearing female HEENT:  Sclerae anicteric.  Oropharynx clear and moist. No ulcerations or evidence of oropharyngeal candidiasis. Neck is supple.  NODES:  No cervical, supraclavicular, or axillary lymphadenopathy palpated.  BREAST EXAM:  Deferred. LUNGS:  Clear to auscultation bilaterally.  No wheezes or rhonchi. HEART:  Regular  Rhythm, tachycardic. No murmur appreciated. ABDOMEN:  Soft, nontender.  Positive, normoactive bowel sounds. No organomegaly palpated. MSK:  No focal spinal tenderness to palpation. Full range of motion bilaterally in the upper extremities. EXTREMITIES:  No peripheral edema.   SKIN:  Clear with no obvious rashes or skin changes. No nail dyscrasia. NEURO:  Nonfocal. Well oriented.  Appropriate affect. ECOG PERFORMANCE STATUS: 1 - Symptomatic but completely  ambulatory  Lab Results: Lab Results  Component Value Date   WBC 2.2* 05/26/2014   HGB 10.3* 05/26/2014   HCT 31.1* 05/26/2014   MCV 96.0 05/26/2014   PLT 245 05/26/2014     Chemistry      Component Value Date/Time   NA 144 05/26/2014 1017   NA 136* 05/06/2014 0440   K 3.2* 05/26/2014 1017   K 3.5* 05/06/2014 0440   CL 102 05/06/2014 0440   CO2 22 05/26/2014 1017   CO2 22 05/06/2014 0440   BUN 16.4 05/26/2014 1017   BUN 18 05/06/2014 0440   CREATININE 0.8 05/26/2014 1017   CREATININE 0.88 05/06/2014 0440      Component Value Date/Time   CALCIUM 8.9 05/26/2014 1017   CALCIUM 8.2* 05/06/2014 0440   ALKPHOS 109 05/26/2014 1017   AST 17 05/26/2014 1017   ALT 29 05/26/2014 1017   BILITOT 0.61 05/26/2014 1017     Assessment and Plan: Jennica Tagliaferri 58 y.o. female with  1.Stage IIB ER positive, HER-2/neu  positive infiltrating ductal carcinoma of the right breast. Please see prior history above.  She is cycle 6 day 8 of 6 planned treatments with Taxotere, Carboplatin, Herceptin, and Perjeta.  She has completed her neoadjuvant regimen.  Her labs are stable today.  She will f/u with Dr. Donne Hazel for surgical planning, undergo her PET scan and we will begin every three week Herceptin with the plan to complete one calendar year of herceptin therapy.    2. Hypokalemia.  A cmet is pending at time of appointment.    3. Neuropathy: This is stable.  She is taking Super b complex daily and tolerating it well.    4. Insomnia.   This is controlled with Ambien CR.    5.  Reflux:  She was prescribed Omeprazole 40mg  daily and is tolerating it well.      6. Cardiac: Her last appointment in the cardio onc clinic was on 02/10/14.  An echocardiogram demonstrated a LVEF of 60-65%.  She was evaluated by Dr. Haroldine Laws and 3 month f/u was recommended.  She does have f/u scheduled next week.     The patient will return on 06/10/14 for labs, evaluation and Herceptin.  She knows to call us in the interim for any questions or concerns.   We can certainly see her sooner if needed.  I spent 25 minutes counseling the patient face to face.  The total time spent in the appointment was 30 minutes.  Minette Headland, [place] [phone] 05/30/2014 3:01 PM

## 2014-05-30 ENCOUNTER — Encounter: Payer: Self-pay | Admitting: *Deleted

## 2014-05-30 ENCOUNTER — Other Ambulatory Visit: Payer: Self-pay | Admitting: *Deleted

## 2014-05-30 ENCOUNTER — Encounter: Payer: Self-pay | Admitting: Oncology

## 2014-05-30 ENCOUNTER — Telehealth: Payer: Self-pay | Admitting: *Deleted

## 2014-05-30 ENCOUNTER — Encounter: Payer: Self-pay | Admitting: Adult Health

## 2014-05-30 NOTE — Progress Notes (Signed)
Faxed ondansetron pa form to [facility]

## 2014-05-30 NOTE — Progress Notes (Signed)
[facility] approved ondansetron from 05/30/14-11/29/14

## 2014-05-30 NOTE — Progress Notes (Signed)
RECEIVED A FAX FROM [facility] CONCERNING A PRIOR AUTHORIZATION FOR ONDANSETRON. THIS REQUEST WAS PLACED IN THE MANAGED CARE BIN.

## 2014-05-30 NOTE — Telephone Encounter (Signed)
Called pt to inform her of Lab results concerning her potassium level(3.2). Communicated with pt to start back on potassium 20 mEq daily. Also pt discussed with me that she's had some bouts of loose stools this weekend but is taking Imodium and is having less stools now. She had 5-6 stools yesterday it has decreased to 1 stool so far today. Pt verbalized understanding. Message to be forwarded to Charlestine Massed, NP.

## 2014-05-31 ENCOUNTER — Ambulatory Visit (INDEPENDENT_AMBULATORY_CARE_PROVIDER_SITE_OTHER): Payer: BC Managed Care – PPO | Admitting: General Surgery

## 2014-05-31 ENCOUNTER — Encounter (INDEPENDENT_AMBULATORY_CARE_PROVIDER_SITE_OTHER): Payer: Self-pay | Admitting: General Surgery

## 2014-05-31 VITALS — BP 130/76 | HR 74 | Temp 98.0°F | Resp 18 | Ht 64.0 in | Wt 186.0 lb

## 2014-05-31 DIAGNOSIS — C50919 Malignant neoplasm of unspecified site of unspecified female breast: Secondary | ICD-10-CM

## 2014-05-31 NOTE — Progress Notes (Signed)
Subjective:     Patient ID: Madeline Davidson, female   DOB: Nov 25, 1956, 58 y.o.   MRN: [id number]  HPI 58 yof who lives in [place] and presents after noting a right breast mass around Christmas. She underwent mm that showed a 3.3 cm lesion in the right breast at 7 oclock. She also had enlarged right axillary node that was biopsied. An ultrasound showed a 2.2x2.4 cm mass with a node measuring 1.6 cm The pathology showed an invasive ductal carcinoma with metastatic carcinoma in the intramammary node. This is er positive at 90%, pr at 1%, her2 is amplified. Initial MRI showed a 3.8x3.7x3.2 cm mass with 2 intramammary nodes present. There were right axillary nodes also. There is also a 2.4 cm intramammary node on the right side. PET showed results below. Latest MRI shows a 3.6x1.8x1.4 cm area of residual enhancement. She states physician couldn't feel mass soon after chemo began. The left breast is normal. The two enlarged right IM nodes are now normal morphology and size. There is a stable paracardiac node. She has had a difficult time with chemotherapy requiring admission for what ends up being likely urosepsis within last couple weeks. She has completed chemotherapy. We discussed her in conference and I have discussed pet findings that were not followed up previously as written.   Review of Systems EXAM:  BILATERAL BREAST MRI WITH AND WITHOUT CONTRAST  TECHNIQUE:  Multiplanar, multisequence MR images of both breasts were obtained  prior to and following the intravenous administration of 28ml of  MultiHance.  THREE-DIMENSIONAL MR IMAGE RENDERING ON INDEPENDENT WORKSTATION:  Three-dimensional MR images were rendered by post-processing of the  original MR data on an independent workstation. The  three-dimensional MR images were interpreted, and findings are  reported in the following complete MRI report for this study. Three  dimensional images were evaluated at the independent DynaCad  workstation   COMPARISON: MRI on 01/25/2014 and imaging from [place], [place]  on 12/31/2013  FINDINGS:  Breast composition: b. Scattered fibroglandular tissue.  Background parenchymal enhancement: Minimal  Right breast: There has been significant improvement and enhancing  mass in the upper-outer quadrant of the right breast. A small area  of residual enhancement now measures 3.6 x 1.8 x 1.4 cm. Previously,  enhancing mass measured 3.8 x 3.7 x 3.2 cm.  Left breast: No mass or abnormal enhancement.  Lymph nodes: The two enlarged intramammary lymph nodes previously  identified in the upper-outer quadrant of the right breast are  currently visible but show normal morphology and size. No suspicious  axillary or internal mammary lymph nodes are identified. Stable  right paracardiac lymph node is identified, measuring 8 mm in  diameter.  Ancillary findings: None.  IMPRESSION:  1. Significant reduction in size of right breast mass in the upper  outer quadrant.  2. Involved right intramammary lymph nodes now have normal  appearance and morphology.  3. Stable right pericardiac lymph node, 8 mm in diameter.      Objective:   Physical Exam deferred    Assessment:     Clinical stage II left breast cancer     Plan:     She is due to get pet,echo and ct scans tomorrow. Will follow up those results. We discussed her at our breast conference. The consensus between medical, radiation oncology was to proceed with a mastectomy to ensure those intramammary nodes that were positive the forward be the best plan for her. I do think it would be reasonable to consider this.  I discussed with her today this would be removing the nipple and the areola. I did offer her a plastic surgery appointment which she will get later this week to consider having a reconstruction at the same time. I also discussed with her a sentinel lymph node biopsy at the same time. She is going to get back to me after plastic surgery  appointment. I did discuss the risks benefits and the postoperative appearance of a mastectomy without reconstruction.

## 2014-06-01 ENCOUNTER — Ambulatory Visit (HOSPITAL_COMMUNITY)
Admission: RE | Admit: 2014-06-01 | Discharge: 2014-06-01 | Disposition: A | Discharge status: Home / Self Care | Payer: BC Managed Care – PPO | Source: Ambulatory Visit | Attending: Oncology | Admitting: Oncology

## 2014-06-01 ENCOUNTER — Telehealth: Payer: Self-pay | Admitting: Adult Health

## 2014-06-01 ENCOUNTER — Telehealth: Payer: Self-pay | Admitting: *Deleted

## 2014-06-01 ENCOUNTER — Ambulatory Visit (HOSPITAL_BASED_OUTPATIENT_CLINIC_OR_DEPARTMENT_OTHER)
Admission: RE | Admit: 2014-06-01 | Discharge: 2014-06-01 | Disposition: A | Discharge status: Home / Self Care | Payer: BC Managed Care – PPO | Source: Ambulatory Visit | Attending: Oncology | Admitting: Oncology

## 2014-06-01 ENCOUNTER — Ambulatory Visit (HOSPITAL_COMMUNITY)
Admission: RE | Admit: 2014-06-01 | Discharge: 2014-06-01 | Disposition: A | Discharge status: Home / Self Care | Payer: BC Managed Care – PPO | Source: Ambulatory Visit | Attending: Adult Health | Admitting: Adult Health

## 2014-06-01 ENCOUNTER — Encounter (HOSPITAL_COMMUNITY): Payer: Self-pay

## 2014-06-01 ENCOUNTER — Encounter (HOSPITAL_COMMUNITY): Payer: Self-pay | Admitting: *Deleted

## 2014-06-01 VITALS — BP 106/64 | HR 133 | Wt 186.8 lb

## 2014-06-01 DIAGNOSIS — R000 Tachycardia, unspecified: Secondary | ICD-10-CM

## 2014-06-01 DIAGNOSIS — C50919 Malignant neoplasm of unspecified site of unspecified female breast: Secondary | ICD-10-CM

## 2014-06-01 DIAGNOSIS — C50519 Malignant neoplasm of lower-outer quadrant of unspecified female breast: Secondary | ICD-10-CM | POA: Insufficient documentation

## 2014-06-01 DIAGNOSIS — I498 Other specified cardiac arrhythmias: Secondary | ICD-10-CM

## 2014-06-01 DIAGNOSIS — I10 Essential (primary) hypertension: Secondary | ICD-10-CM | POA: Insufficient documentation

## 2014-06-01 DIAGNOSIS — E669 Obesity, unspecified: Secondary | ICD-10-CM | POA: Insufficient documentation

## 2014-06-01 DIAGNOSIS — Z79899 Other long term (current) drug therapy: Secondary | ICD-10-CM | POA: Insufficient documentation

## 2014-06-01 DIAGNOSIS — Z170 Estrogen receptor positive status [ER+]: Secondary | ICD-10-CM | POA: Insufficient documentation

## 2014-06-01 DIAGNOSIS — I519 Heart disease, unspecified: Secondary | ICD-10-CM

## 2014-06-01 LAB — GLUCOSE, CAPILLARY: GLUCOSE-CAPILLARY: 92 mg/dL (ref 70–99)

## 2014-06-01 MED ORDER — FLUDEOXYGLUCOSE F - 18 (FDG) INJECTION: 10.0000 | Freq: Once | INTRAVENOUS | Status: DC | PRN

## 2014-06-01 MED ORDER — IOHEXOL 300 MG/ML  SOLN
100.0000 mL | Freq: Once | INTRAMUSCULAR | Status: AC | PRN
  Administered 2014-06-01: 100 mL via INTRAVENOUS

## 2014-06-01 NOTE — Telephone Encounter (Signed)
Per staff message and POF I have scheduled appts.  JMW  

## 2014-06-01 NOTE — Telephone Encounter (Signed)
, °

## 2014-06-01 NOTE — Progress Notes (Signed)
*  PRELIMINARY RESULTS* Echocardiogram 2D Echocardiogram has been performed.  Madeline Davidson 06/01/2014, 10:43 AM

## 2014-06-01 NOTE — Patient Instructions (Signed)
We will contact you in 3 months to schedule your next appointment and echocardiogram  

## 2014-06-02 ENCOUNTER — Encounter: Payer: Self-pay | Admitting: Oncology

## 2014-06-02 DIAGNOSIS — R000 Tachycardia, unspecified: Secondary | ICD-10-CM | POA: Insufficient documentation

## 2014-06-02 NOTE — Progress Notes (Signed)
Patient ID: Madeline Davidson, female   DOB: [birth year], 58 y.o.   MRN: [id number] Oncologist: Dr Madeline Davidson  PCP: Dr Madeline Davidson  HPI:  Madeline Davidson is a 58 year old female with HTN, obesity and right breast cancer in the lower outer quadrant stage II (T2, N1). She was referred to the cardio-oncology clinic by Dr Madeline Davidson   She started chemo 02/03/14 TCH/Perjeta every 21 days for 6 cycles. Followed by Herceptin every 3 weeks for 1 year. Mastectomy is to be scheduled soon.  Patient stopped her lisinopril/HCTZ in 5/15 due to low blood pressure and dehydration.  BP is on the lower side today and rhythm is sinus tachycardia.  She has not been eating/drinking particularly well recently. No dyspnea or chest pain.    ECHO 01/24/14 EF 60-65% Lateral S' 9.8 ECHO 6/15 EF 60-65%, lateral S' 12.8, strain not done  FH: Uncle has HF. Mom has HTN   SH: Lives with her Mom. Works full time [employer]: All systems reviewed and negative except as per HPI.   Past Medical History  Diagnosis Date  . Hypertension   . Allergy   . Hypertension 01/10/2014  . Wears glasses   . Breast cancer 12/16/13     Invasive ductal Carcinoma; 1/1 nodes positive / self palpated    Current Outpatient Prescriptions  Medication Sig Dispense Refill  . dexamethasone (DECADRON) 4 MG tablet       . ibuprofen (ADVIL,MOTRIN) 800 MG tablet Take 800 mg by mouth as needed for moderate pain.       Marland Kitchen lidocaine-prilocaine (EMLA) cream Apply topically as needed.  30 g  6  . loratadine (CLARITIN) 10 MG tablet Take 10 mg by mouth daily.      Marland Kitchen omeprazole (PRILOSEC) 40 MG capsule Take 1 capsule (40 mg total) by mouth daily.  30 capsule  6  . ondansetron (ZOFRAN) 8 MG tablet       . potassium chloride 20 MEQ/15ML (10%) solution       . potassium chloride SA (K-DUR,KLOR-CON) 20 MEQ tablet Take 20 mEq by mouth 2 (two) times daily.      Marland Kitchen PRESCRIPTION MEDICATION CARBOplatin (PARAPLATIN) 700 mg in sodium chloride 0.9 % 250 mL chemo infusion 700 mg   Once 04/28/2014      . PRESCRIPTION MEDICATION pertuzumab (PERJETA) 420 mg in sodium chloride 0.9 % 250 mL chemo infusion 420 mg Once 04/28/2014      . prochlorperazine (COMPAZINE) 10 MG tablet Take 1 tablet (10 mg total) by mouth every 6 (six) hours as needed (Nausea or vomiting).  30 tablet  1  . prochlorperazine (COMPAZINE) 25 MG suppository Place 1 suppository (25 mg total) rectally every 12 (twelve) hours as needed for nausea or vomiting.  12 suppository  0  . zolpidem (AMBIEN CR) 12.5 MG CR tablet Take 1 tablet (12.5 mg total) by mouth at bedtime as needed for sleep.  30 tablet  0  . B Complex-C (SUPER B COMPLEX PO) Take 1 tablet by mouth every morning.      . venlafaxine (EFFEXOR) 37.5 MG tablet Take 1 tablet (37.5 mg total) by mouth 2 (two) times daily.  60 tablet  6   No current facility-administered medications for this encounter.     Allergies  Allergen Reactions  . Ciprofloxacin Other (See Comments)    Patient had a headache after taking Cipro. Made her sinus infection symptoms worse.    History   Social History  . Marital Status: Divorced  Spouse Name: N/A    Number of Children: N/A  . Years of Education: N/A   Occupational History  . Not on file.   Social History Main Topics  . Smoking status: Never Smoker   . Smokeless tobacco: Never Used  . Alcohol Use: No  . Drug Use: No  . Sexual Activity: Not Currently   Other Topics Concern  . Not on file   Social History Narrative  . No narrative on file    Family History  Problem Relation Age of Onset  . Cancer Father 51    colon cancer  . Hypertension Mother     PHYSICAL EXAM: Filed Vitals:   06/01/14 1100  BP: 106/64  Pulse: 133   General:  Well appearing. No respiratory difficulty HEENT: normal Neck: supple. no JVD. Carotids 2+ bilat; no bruits. No lymphadenopathy or thryomegaly appreciated. Cor: PMI nondisplaced. Regular rate & rhythm. No rubs, gallops or murmurs. Lungs: clear Abdomen: soft,  nontender, nondistended. No hepatosplenomegaly. No bruits or masses. Good bowel sounds. Extremities: no cyanosis, clubbing, rash, edema Neuro: alert & oriented x 3, cranial nerves grossly intact. moves all 4 extremities w/o difficulty. Affect pleasant.    No results found for this or any previous visit (from the past 24 hour(s)). Ct Chest W Contrast  06/01/2014   CLINICAL DATA:  Madeline Davidson 58 y.o. 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 Evaluate response to chemotherapy.  EXAM: CT CHEST, ABDOMEN, AND PELVIS WITH CONTRAST  TECHNIQUE: Multidetector CT imaging of the chest, abdomen and pelvis was performed following the standard protocol during bolus administration of intravenous contrast.  CONTRAST:  145m OMNIPAQUE IOHEXOL 300 MG/ML  SOLN  COMPARISON:  CTs 01/20/2014.  PET-CT performed today.  FINDINGS: CT CHEST FINDINGS  Previously demonstrated mass inferolaterally in the right breast is significantly smaller and flatter, now measuring 2.4 x 0.7 cm on image 38. The probable enlarged lymph nodes previously noted in the axillary portion of the right breast have also resolved. There are currently no enlarged axillary, internal mammary, mediastinal or hilar lymph nodes. 6 mm right pericardiac node on image 44 has slightly decreased in size compared with the prior study.  Thyroid nodularity and mild atherosclerosis are stable. There is no pleural or pericardial effusion. The heart size is normal.  The lungs are clear.  There are no worrisome osseous findings.  CT ABDOMEN AND PELVIS FINDINGS  There is stable pneumobilia status post cholecystectomy. Previously demonstrated hepatic steatosis has improved. No focal lesions are identified within the liver. The spleen, adrenal glands, pancreas and kidneys appear normal.  There are no residual enlarged lymph nodes within the upper abdomen. The largest remaining node is portacaval, measuring 6 mm  short axis on image 62. There is no retroperitoneal or pelvic adenopathy. There is no ascites or peritoneal nodularity.  Multiple large ventral abdominal wall hernias are again noted, the largest in the right lower quadrant containing significant portions of the small bowel and cecum. There is no evidence of bowel obstruction or extraluminal fluid collection. The uterus, ovaries and bladder appear unchanged.  There are no worrisome osseous findings. Degenerative changes are present throughout the spine. There is a unilateral pars defect on the right at L5.  IMPRESSION: 1. Interval marked improvement in or resolution of right breast mass, axillary and upper abdominal lymphadenopathy. 2. No evidence of metastatic disease or acute findings. 3. Stable large abdominal wall hernias without signs of bowel obstruction or incarceration.  Electronically Signed   By: Camie Patience M.D.   On: 06/01/2014 15:59   Ct Abdomen Pelvis W Contrast  06/01/2014   CLINICAL DATA:  Charlann Wayne 58 y.o. 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 Evaluate response to chemotherapy.  EXAM: CT CHEST, ABDOMEN, AND PELVIS WITH CONTRAST  TECHNIQUE: Multidetector CT imaging of the chest, abdomen and pelvis was performed following the standard protocol during bolus administration of intravenous contrast.  CONTRAST:  183m OMNIPAQUE IOHEXOL 300 MG/ML  SOLN  COMPARISON:  CTs 01/20/2014.  PET-CT performed today.  FINDINGS: CT CHEST FINDINGS  Previously demonstrated mass inferolaterally in the right breast is significantly smaller and flatter, now measuring 2.4 x 0.7 cm on image 38. The probable enlarged lymph nodes previously noted in the axillary portion of the right breast have also resolved. There are currently no enlarged axillary, internal mammary, mediastinal or hilar lymph nodes. 6 mm right pericardiac node on image 44 has slightly decreased in size compared with the prior  study.  Thyroid nodularity and mild atherosclerosis are stable. There is no pleural or pericardial effusion. The heart size is normal.  The lungs are clear.  There are no worrisome osseous findings.  CT ABDOMEN AND PELVIS FINDINGS  There is stable pneumobilia status post cholecystectomy. Previously demonstrated hepatic steatosis has improved. No focal lesions are identified within the liver. The spleen, adrenal glands, pancreas and kidneys appear normal.  There are no residual enlarged lymph nodes within the upper abdomen. The largest remaining node is portacaval, measuring 6 mm short axis on image 62. There is no retroperitoneal or pelvic adenopathy. There is no ascites or peritoneal nodularity.  Multiple large ventral abdominal wall hernias are again noted, the largest in the right lower quadrant containing significant portions of the small bowel and cecum. There is no evidence of bowel obstruction or extraluminal fluid collection. The uterus, ovaries and bladder appear unchanged.  There are no worrisome osseous findings. Degenerative changes are present throughout the spine. There is a unilateral pars defect on the right at L5.  IMPRESSION: 1. Interval marked improvement in or resolution of right breast mass, axillary and upper abdominal lymphadenopathy. 2. No evidence of metastatic disease or acute findings. 3. Stable large abdominal wall hernias without signs of bowel obstruction or incarceration.   Electronically Signed   By: BCamie PatienceM.D.   On: 06/01/2014 15:59   Nm Pet Image Restag (ps) Skull Base To Thigh  06/01/2014   CLINICAL DATA:  Subsequent treatment strategy for breast cancer. LNikcoleWoicikowfski 57y.o. 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 Hypermetabolic porta hepatis lymph nodes on PET-CT.  EXAM: NUCLEAR MEDICINE PET SKULL BASE TO THIGH  TECHNIQUE: 10.0 mCi F-18 FDG was injected intravenously. Full-ring PET imaging was  performed from the skull base to thigh after the radiotracer. CT data was obtained and used for attenuation correction and anatomic localization.  FASTING BLOOD GLUCOSE:  Value: 92 mg/dl  COMPARISON:  PET-CT 01/20/2014.  Diagnostic CTs performed today.  FINDINGS: NECK  No hypermetabolic cervical lymph nodes are identified.There are no lesions of the pharyngeal mucosal space.  CHEST  There are no hypermetabolic mediastinal, hilar or axillary lymph nodes. There is no hypermetabolic pulmonary activity. There is residual hypermetabolic activity inferolaterally in the right breast at the site of the previously demonstrated mass. This now has an SUV max of 2.1. There is no residual adjacent nodal hypermetabolic activity.  ABDOMEN/PELVIS  There is no hypermetabolic activity within the liver, adrenal glands, spleen or pancreas. There is no residual hypermetabolic nodal activity within the porta hepatis or elsewhere in the abdomen or pelvis. Prominent metabolic activity is demonstrated at the anal verge (SUV max 8.8). This corresponds with prominent soft tissue in this area, but is similar in appearance and metabolic activity to the prior study. Additional CT findings are deferred to the diagnostic studies performed today.  SKELETON  The marrow activity is diffusely prominent consistent with response to interval therapy. There is no focally increased activity to suggest metastatic disease.  IMPRESSION: 1. Minimal residual hypermetabolic activity within the residual mass inferolaterally in the right breast. If this has been resected, this could reflect postsurgical activity. 2. No residual nodal hypermetabolic activity within the chest, abdomen or pelvis. 3. No other evidence of metastatic disease. 4. Focal hypermetabolic activity at the anal verge, similar to prior study and potentially inflammatory. Correlation with physical examination necessary to exclude a mass in this area. 5. Additional CT findings are deferred to the  diagnostic studies dictated separately.   Electronically Signed   By: Camie Patience M.D.   On: 06/01/2014 16:11     ASSESSMENT & PLAN: 1. R Breast Cancer: Currently getting Herceptin every 3 wks.  This will continue for a total of 1 year.  I reviewed today's echo.  EF and lateral S' stable.  She can continue with Herceptin.  Repeat echo with office visit in 3 months.  2. Tachycardia: Sinus tachycardia today, possibly from dehydration.  She has not been eating/drinking well recently.   Brodee Mauritz,MD 06/02/2014

## 2014-06-06 ENCOUNTER — Other Ambulatory Visit: Payer: Self-pay | Admitting: Oncology

## 2014-06-08 ENCOUNTER — Telehealth: Payer: Self-pay | Admitting: Oncology

## 2014-06-08 NOTE — Telephone Encounter (Signed)
, °

## 2014-06-09 ENCOUNTER — Other Ambulatory Visit: Payer: Self-pay | Admitting: *Deleted

## 2014-06-09 ENCOUNTER — Telehealth: Payer: Self-pay

## 2014-06-09 ENCOUNTER — Telehealth: Payer: Self-pay | Admitting: *Deleted

## 2014-06-09 DIAGNOSIS — C50911 Malignant neoplasm of unspecified site of right female breast: Secondary | ICD-10-CM

## 2014-06-09 NOTE — Telephone Encounter (Signed)
Rcvd by fax medication non-adherence therapy advisory dtd 06/09/14 from [facility].  Original to scan.  Copy to [place].

## 2014-06-09 NOTE — Telephone Encounter (Signed)
I have adjusted 7/10 appt

## 2014-06-10 ENCOUNTER — Other Ambulatory Visit (HOSPITAL_BASED_OUTPATIENT_CLINIC_OR_DEPARTMENT_OTHER): Payer: BC Managed Care – PPO

## 2014-06-10 ENCOUNTER — Telehealth: Payer: Self-pay | Admitting: Hematology and Oncology

## 2014-06-10 ENCOUNTER — Ambulatory Visit (HOSPITAL_BASED_OUTPATIENT_CLINIC_OR_DEPARTMENT_OTHER): Payer: BC Managed Care – PPO

## 2014-06-10 ENCOUNTER — Other Ambulatory Visit: Payer: Self-pay | Admitting: Hematology and Oncology

## 2014-06-10 ENCOUNTER — Ambulatory Visit (HOSPITAL_BASED_OUTPATIENT_CLINIC_OR_DEPARTMENT_OTHER): Payer: BC Managed Care – PPO | Admitting: Hematology and Oncology

## 2014-06-10 VITALS — BP 125/77 | HR 114 | Temp 98.7°F | Resp 20 | Ht 64.0 in | Wt 194.0 lb

## 2014-06-10 DIAGNOSIS — Z170 Estrogen receptor positive status [ER+]: Secondary | ICD-10-CM

## 2014-06-10 DIAGNOSIS — G4700 Insomnia, unspecified: Secondary | ICD-10-CM

## 2014-06-10 DIAGNOSIS — E876 Hypokalemia: Secondary | ICD-10-CM

## 2014-06-10 DIAGNOSIS — C50911 Malignant neoplasm of unspecified site of right female breast: Secondary | ICD-10-CM

## 2014-06-10 DIAGNOSIS — C773 Secondary and unspecified malignant neoplasm of axilla and upper limb lymph nodes: Secondary | ICD-10-CM

## 2014-06-10 DIAGNOSIS — C50919 Malignant neoplasm of unspecified site of unspecified female breast: Secondary | ICD-10-CM

## 2014-06-10 DIAGNOSIS — C50319 Malignant neoplasm of lower-inner quadrant of unspecified female breast: Secondary | ICD-10-CM

## 2014-06-10 DIAGNOSIS — Z5112 Encounter for antineoplastic immunotherapy: Secondary | ICD-10-CM

## 2014-06-10 DIAGNOSIS — G609 Hereditary and idiopathic neuropathy, unspecified: Secondary | ICD-10-CM

## 2014-06-10 LAB — COMPREHENSIVE METABOLIC PANEL (CC13)
ALK PHOS: 83 U/L (ref 40–150)
ALT: 16 U/L (ref 0–55)
AST: 18 U/L (ref 5–34)
Albumin: 2.9 g/dL — ABNORMAL LOW (ref 3.5–5.0)
Anion Gap: 9 mEq/L (ref 3–11)
BUN: 9.1 mg/dL (ref 7.0–26.0)
CALCIUM: 8.7 mg/dL (ref 8.4–10.4)
CHLORIDE: 108 meq/L (ref 98–109)
CO2: 29 meq/L (ref 22–29)
Creatinine: 0.8 mg/dL (ref 0.6–1.1)
GLUCOSE: 108 mg/dL (ref 70–140)
POTASSIUM: 3 meq/L — AB (ref 3.5–5.1)
SODIUM: 146 meq/L — AB (ref 136–145)
TOTAL PROTEIN: 5.5 g/dL — AB (ref 6.4–8.3)
Total Bilirubin: 0.3 mg/dL (ref 0.20–1.20)

## 2014-06-10 LAB — CBC WITH DIFFERENTIAL/PLATELET
BASO%: 0.4 % (ref 0.0–2.0)
BASOS ABS: 0 10*3/uL (ref 0.0–0.1)
EOS%: 0.2 % (ref 0.0–7.0)
Eosinophils Absolute: 0 10*3/uL (ref 0.0–0.5)
HCT: 26.8 % — ABNORMAL LOW (ref 34.8–46.6)
HEMOGLOBIN: 8.9 g/dL — AB (ref 11.6–15.9)
LYMPH#: 1.1 10*3/uL (ref 0.9–3.3)
LYMPH%: 14.5 % (ref 14.0–49.7)
MCH: 32.5 pg (ref 25.1–34.0)
MCHC: 33.1 g/dL (ref 31.5–36.0)
MCV: 98 fL (ref 79.5–101.0)
MONO#: 0.7 10*3/uL (ref 0.1–0.9)
MONO%: 8.3 % (ref 0.0–14.0)
NEUT%: 76.6 % (ref 38.4–76.8)
NEUTROS ABS: 6 10*3/uL (ref 1.5–6.5)
Platelets: 284 10*3/uL (ref 145–400)
RBC: 2.73 10*6/uL — ABNORMAL LOW (ref 3.70–5.45)
RDW: 21.8 % — AB (ref 11.2–14.5)
WBC: 7.8 10*3/uL (ref 3.9–10.3)

## 2014-06-10 MED ORDER — SODIUM CHLORIDE 0.9 % IV SOLN
Freq: Once | INTRAVENOUS | Status: AC
  Administered 2014-06-10: 15:00:00 via INTRAVENOUS

## 2014-06-10 MED ORDER — HEPARIN SOD (PORK) LOCK FLUSH 100 UNIT/ML IV SOLN
500.0000 [IU] | Freq: Once | INTRAVENOUS | Status: AC | PRN
  Administered 2014-06-10: 500 [IU]
  Filled 2014-06-10: qty 5

## 2014-06-10 MED ORDER — DIPHENHYDRAMINE HCL 25 MG PO CAPS
50.0000 mg | ORAL_CAPSULE | Freq: Once | ORAL | Status: AC
  Administered 2014-06-10: 50 mg via ORAL

## 2014-06-10 MED ORDER — SODIUM CHLORIDE 0.9 % IJ SOLN
10.0000 mL | INTRAMUSCULAR | Status: DC | PRN
  Administered 2014-06-10: 10 mL
  Filled 2014-06-10: qty 10

## 2014-06-10 MED ORDER — SODIUM CHLORIDE 0.9 % IV SOLN
Freq: Once | INTRAVENOUS | Status: AC
  Administered 2014-06-10: 16:00:00 via INTRAVENOUS
  Filled 2014-06-10: qty 250

## 2014-06-10 MED ORDER — TRASTUZUMAB CHEMO INJECTION 440 MG
567.0000 mg | Freq: Once | INTRAVENOUS | Status: AC
  Administered 2014-06-10: 567 mg via INTRAVENOUS
  Filled 2014-06-10: qty 27

## 2014-06-10 MED ORDER — DIPHENHYDRAMINE HCL 25 MG PO CAPS
ORAL_CAPSULE | ORAL | Status: AC
  Filled 2014-06-10: qty 2

## 2014-06-10 MED ORDER — ACETAMINOPHEN 325 MG PO TABS
ORAL_TABLET | ORAL | Status: AC
  Filled 2014-06-10: qty 2

## 2014-06-10 MED ORDER — ACETAMINOPHEN 325 MG PO TABS
650.0000 mg | ORAL_TABLET | Freq: Once | ORAL | Status: AC
  Administered 2014-06-10: 650 mg via ORAL

## 2014-06-10 MED ORDER — POTASSIUM CHLORIDE CRYS ER 20 MEQ PO TBCR: 20.0000 meq | EXTENDED_RELEASE_TABLET | Freq: Two times a day (BID) | ORAL | Status: DC

## 2014-06-10 NOTE — Telephone Encounter (Signed)
per pof  appt already sch-pt has updated copy of sch

## 2014-06-10 NOTE — Progress Notes (Signed)
Hematology and Oncology Follow Up Visit  Madeline Davidson [id number] 07/23/1956 58 y.o. 06/10/2014 2:43 PM   Chief complaint: For initiation of Herceptin therapy  Principle Diagnosis:Madeline Davidson 58 y.o. 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   Prior Therapy: As per previously documented note:  #1 Patient on 12/16/2013 felt a lump in her right breast. She states it was nickel size. Because of this she 1 on to see her nurse practitioner. On 12/31/2013 a mammogram was ordered. This showed a lesion in the 7:00 position measuring 3.3 cm. She was also noted to have enlarged lymph nodes. Subsequent ultrasound showed a 2.2 x 2.4 cm mass with enlarged lymph node measuring 1.6 cm. This was biopsied in [place]. 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 Lymph node was positive for metastatic disease. Prognostic markers revealed the tumor to be estrogen receptor +90% progesterone receptor 1% HER-2/neu positive   #2 staging PET/CT performed on 82/99/3716:  Hypermetabolic mass posterior right breast with SUV max 13.6 and  measuring 13.2 cm (image 108). There two 12 in 16 mm hypermetabolic  nodules in the posterior right breast versus lymph nodes with  intense metabolic activity SUV max 10.9. No hypermetabolic  supraclavicular internal mammary nodes. No hypermetabolic  mediastinal lymph nodes. No suspicious pulmonary nodules.  ABDOMEN/PELVIS  There are 3 hypermetabolic periportal lymph nodes. The largest  measures 20 mm with SUV max 13.6. Additional small less than 10 mm  short axis nodes on image 144 and 130 also have intense metabolic  activity. No abnormal metabolic activity within the liver. No  hypermetabolic abdominal pelvic lymph nodes.    Interim History: Madeline Davidson 58 y.o. female with stage IIB ER positive, HER-2/neu positive infiltrating ductal carcinoma of the right breast is here for followup  visit.  She completed 6 cycles of neoadjuvant Taxotere, Carboplatin, Herceptin, and Perjeta. She was evaluated by Dr. Donne Hazel and was referred to plastic surgery. She opted to proceed with right breast mastectomy and she says she'll be calling Dr. Cristal Generous office to schedule for surgery. Patient says since the  time of last visit her nail sensitivity slightly improving and facial skin rash is better She  had the PET scan, CT of the chest and abdomen and also echocardiogram done on 06/01/2014 She complains swelling in the lower extremities but is better when compared to before   She denies any fevers, chills, chest pain, palpitations, nausea, vomiting, constipation, diarrhea, headaches, blurred vision or any further concerns.    Medications:  Current Outpatient Prescriptions  Medication Sig Dispense Refill  . B Complex-C (SUPER B COMPLEX PO) Take 1 tablet by mouth every morning.      . cetirizine (ZYRTEC) 10 MG tablet Take 10 mg by mouth daily as needed for allergies.      Marland Kitchen ibuprofen (ADVIL,MOTRIN) 800 MG tablet Take 800 mg by mouth as needed for moderate pain.       Marland Kitchen lidocaine-prilocaine (EMLA) cream Apply topically as needed.  30 g  6  . omeprazole (PRILOSEC) 40 MG capsule Take 1 capsule (40 mg total) by mouth daily.  30 capsule  6  . prochlorperazine (COMPAZINE) 10 MG tablet Take 1 tablet (10 mg total) by mouth every 6 (six) hours as needed (Nausea or vomiting).  30 tablet  1  . zolpidem (AMBIEN CR) 12.5 MG CR tablet Take 1 tablet (12.5 mg total) by mouth at bedtime as needed for sleep.  30 tablet  0  .  dexamethasone (DECADRON) 4 MG tablet       . ondansetron (ZOFRAN) 8 MG tablet       . potassium chloride 20 MEQ/15ML (10%) solution       . potassium chloride SA (K-DUR,KLOR-CON) 20 MEQ tablet Take 20 mEq by mouth 2 (two) times daily.      Marland Kitchen PRESCRIPTION MEDICATION pertuzumab (PERJETA) 420 mg in sodium chloride 0.9 % 250 mL chemo infusion 420 mg Once 04/28/2014      . prochlorperazine  (COMPAZINE) 25 MG suppository Place 1 suppository (25 mg total) rectally every 12 (twelve) hours as needed for nausea or vomiting.  12 suppository  0  . venlafaxine (EFFEXOR) 37.5 MG tablet Take 1 tablet (37.5 mg total) by mouth 2 (two) times daily.  60 tablet  6   No current facility-administered medications for this visit.     Allergies:  Allergies  Allergen Reactions  . Ciprofloxacin Other (See Comments)    Patient had a headache after taking Cipro. Made her sinus infection symptoms worse.    Medical History: Past Medical History  Diagnosis Date  . Hypertension   . Allergy   . Hypertension 01/10/2014  . Wears glasses   . Breast cancer 12/16/13     Invasive ductal Carcinoma; 1/1 nodes positive / self palpated    Surgical History:  Past Surgical History  Procedure Laterality Date  . Colon resection  2004  . Hernia repair  2005    umb  . Cholecystectomy  2005  . Portacath placement Left 01/31/2014    Procedure: INSERTION PORT-A-CATH;  Surgeon: Rolm Bookbinder, MD;  Location: [facility];  Service: General;  Laterality: Left;     Review of Systems: A 10 point review of systems was conducted and is otherwise negative except for what is noted above.    Physical Exam: Blood pressure 125/77, pulse 114, temperature 98.7 F (37.1 C), temperature source Oral, resp. rate 20, height 5' 4" (1.626 m), weight 194 lb (87.998 kg). GENERAL: Patient is an acutely ill appearing female HEENT:  Sclerae anicteric.  Oropharynx clear and moist. No ulcerations or evidence of oropharyngeal candidiasis. Neck is supple.  NODES:  No cervical, supraclavicular, or axillary lymphadenopathy palpated.  BREAST EXAM:  Right breast mass decrease in size and compared to before. left breast no masses felt, no bilateral axillary lymphadenopathy appreciated  LUNGS:  Clear to auscultation bilaterally.  No wheezes or rhonchi. HEART:  Regular  Rhythm, tachycardic. No murmur appreciated. ABDOMEN:  Soft, nontender.   Positive, normoactive bowel sounds. No organomegaly palpated. MSK:  No focal spinal tenderness to palpation. Full range of motion bilaterally in the upper extremities. EXTREMITIES:  No peripheral edema.   SKIN:  Clear with no obvious rashes or skin changes. No nail dyscrasia. NEURO:  Nonfocal. Well oriented.  Appropriate affect. ECOG PERFORMANCE STATUS: 1 - Symptomatic but completely ambulatory  Lab Results: Lab Results  Component Value Date   WBC 7.8 06/10/2014   HGB 8.9* 06/10/2014   HCT 26.8* 06/10/2014   MCV 98.0 06/10/2014   PLT 284 06/10/2014     Chemistry      Component Value Date/Time   NA 146* 06/10/2014 1341   NA 136* 05/06/2014 0440   K 3.0* 06/10/2014 1341   K 3.5* 05/06/2014 0440   CL 102 05/06/2014 0440   CO2 29 06/10/2014 1341   CO2 22 05/06/2014 0440   BUN 9.1 06/10/2014 1341   BUN 18 05/06/2014 0440   CREATININE 0.8 06/10/2014 1341   CREATININE 0.88  05/06/2014 0440      Component Value Date/Time   CALCIUM 8.7 06/10/2014 1341   CALCIUM 8.2* 05/06/2014 0440   ALKPHOS 83 06/10/2014 1341   AST 18 06/10/2014 1341   ALT 16 06/10/2014 1341   BILITOT 0.30 06/10/2014 1341     Assessment and Plan: Madeline Davidson 58 y.o. female with  1.Stage IIB ER positive, HER-2/neu positive infiltrating ductal carcinoma of the right breast. Please see prior history above.  She completed 6 cycles of neoadjuvant chemotherapy with Taxotere, Carboplatin, Herceptin, and Perjeta.  We will initiate  every three week Herceptin with the plan to complete one calendar year of herceptin therapy.    2. restaging PET scan which was performed on 06/01/2014 revealed minimal residual hypermetabolic activity within the residual mass in the  right breast and no residual nodal hypermetabolic activity within the chest was noted with no distant metastases.  Focal hypermetabolic activity was noted at the anal verge which similar to prior study potentially inflammatory was noted.  3. CT of the chest abdomen and pelvis  with contrast revealed marked improvement in resolution of right breast mass, axillary and upper abdominal lymphadenopathy   4. Hypokalemia.   51mq of KCl has been ordered to the IV fluids today. Followed by instructed the patient to increase 20 meq from twice a day to 3 times a day for one day followed by 20 meq twice daily for 3 days. She will come on Tuesday for BMP for further adjustment of KCl  5. Neuropathy: This is stable.  She is taking Super b complex daily and tolerating it well.    6. Insomnia.   This is controlled with Ambien CR.    7.  Reflux:  She was prescribed Omeprazole 428mdaily and is tolerating it well.      8. 2-D echocardiogram performed on 06/01/2014 revealed ejection fraction of 55-60%  9. Follow up with Dr. WaDonne Hazelor right breast surgery  Next followup visit in 3 weeks for  Herceptin therapy. Lab appointment on Tuesday for BMP and magnesium level    she is in agreement with the current plan of care. She knows to call [facility] the interim for any questions or concerns.  We can certainly see her sooner if needed.  I spent 20 minutes counseling the patient face to face.  The total time spent in the appointment was 30 minutes.  PaWilmon ArmsM.D.  [id number]/2015 2:43 PM

## 2014-06-10 NOTE — Patient Instructions (Addendum)
Potassium tablets: Take 3 tablets tomorrow (Saturday 6/20), then 2 tablets Sunday, Monday and Tuesday (6/21, 6/22, 6/23).  Return on 6/23 for a lab appointment at 11:30am for repeat lab work.    [facility] Discharge Instructions for Patients Receiving Chemotherapy  Today you received the following chemotherapy agents Herceptin.  To help prevent nausea and vomiting after your treatment, we encourage you to take your nausea medication.   If you develop nausea and vomiting that is not controlled by your nausea medication, call the clinic.   BELOW ARE SYMPTOMS THAT SHOULD BE REPORTED IMMEDIATELY:  *FEVER GREATER THAN 100.5 F  *CHILLS WITH OR WITHOUT FEVER  NAUSEA AND VOMITING THAT IS NOT CONTROLLED WITH YOUR NAUSEA MEDICATION  *UNUSUAL SHORTNESS OF BREATH  *UNUSUAL BRUISING OR BLEEDING  TENDERNESS IN MOUTH AND THROAT WITH OR WITHOUT PRESENCE OF ULCERS  *URINARY PROBLEMS  *BOWEL PROBLEMS  UNUSUAL RASH Items with * indicate a potential emergency and should be followed up as soon as possible.  Feel free to call the clinic you have any questions or concerns. The clinic phone number is [phone].

## 2014-06-10 NOTE — Progress Notes (Signed)
Per Dr Armond Hang needs IVF with 20 meq K today.  At home, pt is to take 3 tablets tomorrow (Saturday 6/20), then 2 tablets Sunday, Monday and Tuesday (6/21, 6/22, 6/23).  Return on 6/23 for a repeat labs.  Informed patent info will be printed on AVS.

## 2014-06-13 ENCOUNTER — Other Ambulatory Visit (INDEPENDENT_AMBULATORY_CARE_PROVIDER_SITE_OTHER): Payer: Self-pay | Admitting: General Surgery

## 2014-06-14 ENCOUNTER — Other Ambulatory Visit: Payer: Self-pay | Admitting: Oncology

## 2014-06-14 ENCOUNTER — Other Ambulatory Visit (INDEPENDENT_AMBULATORY_CARE_PROVIDER_SITE_OTHER): Payer: Self-pay | Admitting: General Surgery

## 2014-06-14 ENCOUNTER — Encounter: Payer: Self-pay | Admitting: Hematology and Oncology

## 2014-06-14 ENCOUNTER — Other Ambulatory Visit (HOSPITAL_BASED_OUTPATIENT_CLINIC_OR_DEPARTMENT_OTHER): Payer: BC Managed Care – PPO

## 2014-06-14 DIAGNOSIS — C50912 Malignant neoplasm of unspecified site of left female breast: Secondary | ICD-10-CM

## 2014-06-14 DIAGNOSIS — C50319 Malignant neoplasm of lower-inner quadrant of unspecified female breast: Secondary | ICD-10-CM

## 2014-06-14 DIAGNOSIS — E876 Hypokalemia: Secondary | ICD-10-CM

## 2014-06-14 DIAGNOSIS — C773 Secondary and unspecified malignant neoplasm of axilla and upper limb lymph nodes: Secondary | ICD-10-CM

## 2014-06-14 DIAGNOSIS — C50919 Malignant neoplasm of unspecified site of unspecified female breast: Secondary | ICD-10-CM

## 2014-06-14 LAB — CBC WITH DIFFERENTIAL/PLATELET
BASO%: 0.4 % (ref 0.0–2.0)
Basophils Absolute: 0 10*3/uL (ref 0.0–0.1)
EOS%: 0.4 % (ref 0.0–7.0)
Eosinophils Absolute: 0 10*3/uL (ref 0.0–0.5)
HCT: 29.3 % — ABNORMAL LOW (ref 34.8–46.6)
HGB: 9.2 g/dL — ABNORMAL LOW (ref 11.6–15.9)
LYMPH%: 15.7 % (ref 14.0–49.7)
MCH: 31.8 pg (ref 25.1–34.0)
MCHC: 31.4 g/dL — ABNORMAL LOW (ref 31.5–36.0)
MCV: 101.4 fL — AB (ref 79.5–101.0)
MONO#: 0.6 10*3/uL (ref 0.1–0.9)
MONO%: 8.3 % (ref 0.0–14.0)
NEUT#: 5.6 10*3/uL (ref 1.5–6.5)
NEUT%: 75.2 % (ref 38.4–76.8)
PLATELETS: 262 10*3/uL (ref 145–400)
RBC: 2.89 10*6/uL — AB (ref 3.70–5.45)
RDW: 19.6 % — AB (ref 11.2–14.5)
WBC: 7.5 10*3/uL (ref 3.9–10.3)
lymph#: 1.2 10*3/uL (ref 0.9–3.3)

## 2014-06-14 LAB — COMPREHENSIVE METABOLIC PANEL (CC13)
ALK PHOS: 80 U/L (ref 40–150)
ALT: 15 U/L (ref 0–55)
AST: 19 U/L (ref 5–34)
Albumin: 2.8 g/dL — ABNORMAL LOW (ref 3.5–5.0)
Anion Gap: 8 mEq/L (ref 3–11)
BILIRUBIN TOTAL: 0.32 mg/dL (ref 0.20–1.20)
BUN: 10.3 mg/dL (ref 7.0–26.0)
CO2: 29 mEq/L (ref 22–29)
Calcium: 9.4 mg/dL (ref 8.4–10.4)
Chloride: 109 mEq/L (ref 98–109)
Creatinine: 0.8 mg/dL (ref 0.6–1.1)
Glucose: 102 mg/dl (ref 70–140)
POTASSIUM: 4.4 meq/L (ref 3.5–5.1)
Sodium: 146 mEq/L — ABNORMAL HIGH (ref 136–145)
Total Protein: 5.6 g/dL — ABNORMAL LOW (ref 6.4–8.3)

## 2014-06-14 NOTE — Progress Notes (Signed)
Put fmla form on nurse's desk °

## 2014-06-15 ENCOUNTER — Encounter: Payer: Self-pay | Admitting: Hematology and Oncology

## 2014-06-15 NOTE — Progress Notes (Signed)
Faxed fmla form to [employer] [phone] and disability paper to [place] @ [phone]

## 2014-06-16 ENCOUNTER — Encounter (HOSPITAL_COMMUNITY): Payer: Self-pay | Admitting: Pharmacy Technician

## 2014-06-17 NOTE — Pre-Procedure Instructions (Signed)
[facility]  06/17/2014   Your procedure is scheduled on:  Wednesday, July 1st  Report to [facility] at 1015 AM.  Call this number if you have problems the morning of surgery: [phone]   Remember:   Do not eat food or drink liquids after midnight.   Take these medicines the morning of surgery with A SIP OF WATER: klonopin if needed   Do not wear jewelry, make-up or nail polish.  Do not wear lotions, powders, or perfume,deodorant.  Do not shave 48 hours prior to surgery. Men may shave face and neck.  Do not bring valuables to the hospital.  [facility] is not responsible  for any belongings or valuables.               Contacts, dentures or bridgework may not be worn into surgery.  Leave suitcase in the car. After surgery it may be brought to your room.  For patients admitted to the hospital, discharge time is determined by your treatment team.               Patients discharged the day of surgery will not be allowed to drive home.  Please read over the following fact sheets that you were given: Pain Booklet, Coughing and Deep Breathing and Surgical Site Infection Prevention [place] - Preparing for Surgery  Before surgery, you can play an important role.  Because skin is not sterile, your skin needs to be as free of germs as possible.  You can reduce the number of germs on you skin by washing with CHG (chlorahexidine gluconate) soap before surgery.  CHG is an antiseptic cleaner which kills germs and bonds with the skin to continue killing germs even after washing.  Please DO NOT use if you have an allergy to CHG or antibacterial soaps.  If your skin becomes reddened/irritated stop using the CHG and inform your nurse when you arrive at Short Stay.  Do not shave (including legs and underarms) for at least 48 hours prior to the first CHG shower.  You may shave your face.  Please follow these instructions carefully:   1.  Shower with CHG Soap the night  before surgery and the morning of Surgery.  2.  If you choose to wash your hair, wash your hair first as usual with your normal shampoo.  3.  After you shampoo, rinse your hair and body thoroughly to remove the shampoo.  4.  Use CHG as you would any other liquid soap.  You can apply CHG directly to the skin and wash gently with scrungie or a clean washcloth.  5.  Apply the CHG Soap to your body ONLY FROM THE NECK DOWN.  Do not use on open wounds or open sores.  Avoid contact with your eyes, ears, mouth and genitals (private parts).  Wash genitals (private parts) with your normal soap.  6.  Wash thoroughly, paying special attention to the area where your surgery will be performed.  7.  Thoroughly rinse your body with warm water from the neck down.  8.  DO NOT shower/wash with your normal soap after using and rinsing off the CHG Soap.  9.  Pat yourself dry with a clean towel.            10.  Wear clean pajamas.            11.  Place clean sheets on your bed the night of your first shower and do not sleep  with pets.  Day of Surgery  Do not apply any lotions/deoderants the morning of surgery.  Please wear clean clothes to the hospital/surgery center.

## 2014-06-20 ENCOUNTER — Encounter (HOSPITAL_COMMUNITY): Payer: Self-pay

## 2014-06-20 ENCOUNTER — Other Ambulatory Visit (INDEPENDENT_AMBULATORY_CARE_PROVIDER_SITE_OTHER): Payer: Self-pay | Admitting: General Surgery

## 2014-06-20 ENCOUNTER — Encounter (HOSPITAL_COMMUNITY)
Admission: RE | Admit: 2014-06-20 | Discharge: 2014-06-20 | Disposition: A | Discharge status: Home / Self Care | Payer: BC Managed Care – PPO | Source: Ambulatory Visit | Attending: General Surgery | Admitting: General Surgery

## 2014-06-20 HISTORY — DX: Anxiety disorder, unspecified: F41.9

## 2014-06-20 HISTORY — DX: Anemia, unspecified: D64.9

## 2014-06-20 HISTORY — DX: Unspecified osteoarthritis, unspecified site: M19.90

## 2014-06-20 HISTORY — DX: Other specified postprocedural states: R11.2

## 2014-06-20 HISTORY — DX: Personal history of other medical treatment: Z92.89

## 2014-06-20 HISTORY — DX: Other specified postprocedural states: Z98.890

## 2014-06-20 LAB — BASIC METABOLIC PANEL
BUN: 10 mg/dL (ref 6–23)
CO2: 26 mEq/L (ref 19–32)
CREATININE: 0.73 mg/dL (ref 0.50–1.10)
Calcium: 9.6 mg/dL (ref 8.4–10.5)
Chloride: 102 mEq/L (ref 96–112)
Glucose, Bld: 109 mg/dL — ABNORMAL HIGH (ref 70–99)
Potassium: 4 mEq/L (ref 3.7–5.3)
Sodium: 144 mEq/L (ref 137–147)

## 2014-06-20 LAB — CBC WITH DIFFERENTIAL/PLATELET
Basophils Absolute: 0 10*3/uL (ref 0.0–0.1)
Basophils Relative: 1 % (ref 0–1)
EOS PCT: 1 % (ref 0–5)
Eosinophils Absolute: 0.1 10*3/uL (ref 0.0–0.7)
HCT: 32.5 % — ABNORMAL LOW (ref 36.0–46.0)
Hemoglobin: 10.3 g/dL — ABNORMAL LOW (ref 12.0–15.0)
Lymphocytes Relative: 15 % (ref 12–46)
Lymphs Abs: 1.2 10*3/uL (ref 0.7–4.0)
MCH: 31.8 pg (ref 26.0–34.0)
MCHC: 31.7 g/dL (ref 30.0–36.0)
MCV: 100.3 fL — AB (ref 78.0–100.0)
Monocytes Absolute: 0.7 10*3/uL (ref 0.1–1.0)
Monocytes Relative: 9 % (ref 3–12)
Neutro Abs: 6.1 10*3/uL (ref 1.7–7.7)
Neutrophils Relative %: 76 % (ref 43–77)
Platelets: 300 10*3/uL (ref 150–400)
RBC: 3.24 MIL/uL — ABNORMAL LOW (ref 3.87–5.11)
RDW: 17.6 % — AB (ref 11.5–15.5)
WBC: 8 10*3/uL (ref 4.0–10.5)

## 2014-06-20 NOTE — Progress Notes (Addendum)
Primary - Madeline Davidson in [place] Only followed by cardiologist for chemo not for heart issues but had echo and ekg within last year in epic   Called dr. Cristal Generous office regarding wrong side for surgery posted/on consent. Talked with [place] and stated she would forward to dr. Donne Hazel to correct.  Informed patient she would sign consent on morning of surgery.

## 2014-06-21 MED ORDER — CEFAZOLIN SODIUM-DEXTROSE 2-3 GM-% IV SOLR
2.0000 g | INTRAVENOUS | Status: AC
  Administered 2014-06-22: 2 g via INTRAVENOUS
  Filled 2014-06-21: qty 50

## 2014-06-21 NOTE — Progress Notes (Signed)
Patient will be here at [facility].

## 2014-06-22 ENCOUNTER — Observation Stay (HOSPITAL_COMMUNITY)
Admission: RE | Admit: 2014-06-22 | Discharge: 2014-06-24 | Disposition: A | Discharge status: Home / Self Care | Payer: BC Managed Care – PPO | Source: Ambulatory Visit | Attending: General Surgery | Admitting: General Surgery

## 2014-06-22 ENCOUNTER — Encounter (HOSPITAL_COMMUNITY): Payer: Self-pay | Admitting: *Deleted

## 2014-06-22 ENCOUNTER — Encounter (HOSPITAL_COMMUNITY)
Admission: RE | Admit: 2014-06-22 | Discharge: 2014-06-22 | Disposition: A | Discharge status: Home / Self Care | Payer: BC Managed Care – PPO | Source: Ambulatory Visit | Attending: General Surgery | Admitting: General Surgery

## 2014-06-22 ENCOUNTER — Ambulatory Visit (HOSPITAL_COMMUNITY): Payer: BC Managed Care – PPO | Admitting: Anesthesiology

## 2014-06-22 ENCOUNTER — Encounter (HOSPITAL_COMMUNITY): Payer: BC Managed Care – PPO | Admitting: Anesthesiology

## 2014-06-22 ENCOUNTER — Encounter (HOSPITAL_COMMUNITY)
Admission: RE | Disposition: A | Discharge status: Home / Self Care | Payer: Self-pay | Source: Ambulatory Visit | Attending: General Surgery

## 2014-06-22 DIAGNOSIS — C50912 Malignant neoplasm of unspecified site of left female breast: Secondary | ICD-10-CM

## 2014-06-22 DIAGNOSIS — C50411 Malignant neoplasm of upper-outer quadrant of right female breast: Secondary | ICD-10-CM | POA: Diagnosis present

## 2014-06-22 DIAGNOSIS — Z01812 Encounter for preprocedural laboratory examination: Secondary | ICD-10-CM | POA: Insufficient documentation

## 2014-06-22 DIAGNOSIS — C50919 Malignant neoplasm of unspecified site of unspecified female breast: Secondary | ICD-10-CM

## 2014-06-22 DIAGNOSIS — D649 Anemia, unspecified: Secondary | ICD-10-CM | POA: Insufficient documentation

## 2014-06-22 DIAGNOSIS — C773 Secondary and unspecified malignant neoplasm of axilla and upper limb lymph nodes: Secondary | ICD-10-CM | POA: Insufficient documentation

## 2014-06-22 DIAGNOSIS — C50419 Malignant neoplasm of upper-outer quadrant of unspecified female breast: Principal | ICD-10-CM | POA: Insufficient documentation

## 2014-06-22 DIAGNOSIS — F411 Generalized anxiety disorder: Secondary | ICD-10-CM | POA: Insufficient documentation

## 2014-06-22 DIAGNOSIS — I10 Essential (primary) hypertension: Secondary | ICD-10-CM | POA: Insufficient documentation

## 2014-06-22 DIAGNOSIS — Z170 Estrogen receptor positive status [ER+]: Secondary | ICD-10-CM | POA: Insufficient documentation

## 2014-06-22 DIAGNOSIS — Z9221 Personal history of antineoplastic chemotherapy: Secondary | ICD-10-CM | POA: Insufficient documentation

## 2014-06-22 HISTORY — PX: MASTECTOMY W/ SENTINEL NODE BIOPSY: SHX2001

## 2014-06-22 SURGERY — MASTECTOMY WITH SENTINEL LYMPH NODE BIOPSY
Anesthesia: Regional | Site: Breast | Laterality: Right

## 2014-06-22 MED ORDER — CLONAZEPAM 0.5 MG PO TABS
0.5000 mg | ORAL_TABLET | Freq: Two times a day (BID) | ORAL | Status: DC | PRN
  Administered 2014-06-23 (×2): 0.5 mg via ORAL
  Filled 2014-06-22 (×2): qty 1

## 2014-06-22 MED ORDER — MIDAZOLAM HCL 2 MG/2ML IJ SOLN
INTRAMUSCULAR | Status: AC
  Filled 2014-06-22: qty 2

## 2014-06-22 MED ORDER — PROPOFOL 10 MG/ML IV BOLUS
INTRAVENOUS | Status: DC | PRN
  Administered 2014-06-22: 150 mg via INTRAVENOUS

## 2014-06-22 MED ORDER — EPHEDRINE SULFATE 50 MG/ML IJ SOLN
INTRAMUSCULAR | Status: DC | PRN
  Administered 2014-06-22: 10 mg via INTRAVENOUS

## 2014-06-22 MED ORDER — LIDOCAINE HCL (CARDIAC) 20 MG/ML IV SOLN
INTRAVENOUS | Status: AC
  Filled 2014-06-22: qty 5

## 2014-06-22 MED ORDER — ONDANSETRON HCL 4 MG/2ML IJ SOLN
INTRAMUSCULAR | Status: DC | PRN
  Administered 2014-06-22: 4 mg via INTRAVENOUS

## 2014-06-22 MED ORDER — CEFAZOLIN SODIUM-DEXTROSE 2-3 GM-% IV SOLR
2.0000 g | Freq: Three times a day (TID) | INTRAVENOUS | Status: AC
  Administered 2014-06-22 – 2014-06-23 (×3): 2 g via INTRAVENOUS
  Filled 2014-06-22 (×3): qty 50

## 2014-06-22 MED ORDER — FENTANYL CITRATE 0.05 MG/ML IJ SOLN
100.0000 ug | Freq: Once | INTRAMUSCULAR | Status: AC
  Administered 2014-06-22: 100 ug via INTRAVENOUS

## 2014-06-22 MED ORDER — MIDAZOLAM HCL 2 MG/2ML IJ SOLN
INTRAMUSCULAR | Status: AC
  Administered 2014-06-22: 2 mg
  Filled 2014-06-22: qty 2

## 2014-06-22 MED ORDER — ARTIFICIAL TEARS OP OINT
TOPICAL_OINTMENT | OPHTHALMIC | Status: AC
  Filled 2014-06-22: qty 3.5

## 2014-06-22 MED ORDER — METHYLENE BLUE 1 % INJ SOLN
INTRAMUSCULAR | Status: AC
  Filled 2014-06-22: qty 10

## 2014-06-22 MED ORDER — ACETAMINOPHEN 325 MG PO TABS: 650.0000 mg | ORAL_TABLET | Freq: Four times a day (QID) | ORAL | Status: DC | PRN

## 2014-06-22 MED ORDER — 0.9 % SODIUM CHLORIDE (POUR BTL) OPTIME
TOPICAL | Status: DC | PRN
  Administered 2014-06-22 (×2): 1000 mL

## 2014-06-22 MED ORDER — OXYCODONE HCL 5 MG PO TABS
5.0000 mg | ORAL_TABLET | Freq: Once | ORAL | Status: AC | PRN
  Administered 2014-06-22: 5 mg via ORAL

## 2014-06-22 MED ORDER — DEXAMETHASONE SODIUM PHOSPHATE 4 MG/ML IJ SOLN
INTRAMUSCULAR | Status: DC | PRN
  Administered 2014-06-22: 4 mg via INTRAVENOUS

## 2014-06-22 MED ORDER — OXYCODONE HCL 5 MG PO TABS
ORAL_TABLET | ORAL | Status: AC
  Filled 2014-06-22: qty 1

## 2014-06-22 MED ORDER — HYDROMORPHONE HCL PF 1 MG/ML IJ SOLN
INTRAMUSCULAR | Status: AC
  Filled 2014-06-22: qty 1

## 2014-06-22 MED ORDER — SODIUM CHLORIDE 0.9 % IJ SOLN
INTRAMUSCULAR | Status: AC
  Filled 2014-06-22: qty 10

## 2014-06-22 MED ORDER — MORPHINE SULFATE 2 MG/ML IJ SOLN: 2.0000 mg | INTRAMUSCULAR | Status: DC | PRN

## 2014-06-22 MED ORDER — ACETAMINOPHEN 650 MG RE SUPP: 650.0000 mg | Freq: Four times a day (QID) | RECTAL | Status: DC | PRN

## 2014-06-22 MED ORDER — FENTANYL CITRATE 0.05 MG/ML IJ SOLN
INTRAMUSCULAR | Status: AC
  Filled 2014-06-22: qty 5

## 2014-06-22 MED ORDER — SODIUM CHLORIDE 0.9 % IV SOLN
INTRAVENOUS | Status: DC
  Administered 2014-06-22 – 2014-06-23 (×2): via INTRAVENOUS

## 2014-06-22 MED ORDER — PROPOFOL 10 MG/ML IV BOLUS
INTRAVENOUS | Status: AC
  Filled 2014-06-22: qty 20

## 2014-06-22 MED ORDER — FENTANYL CITRATE 0.05 MG/ML IJ SOLN
INTRAMUSCULAR | Status: DC | PRN
  Administered 2014-06-22 (×5): 50 ug via INTRAVENOUS

## 2014-06-22 MED ORDER — MIDAZOLAM HCL 5 MG/ML IJ SOLN: 2.0000 mg | Freq: Once | INTRAMUSCULAR | Status: DC

## 2014-06-22 MED ORDER — TECHNETIUM TC 99M SULFUR COLLOID FILTERED
1.0000 | Freq: Once | INTRAVENOUS | Status: AC | PRN
  Administered 2014-06-22: 1 via INTRADERMAL

## 2014-06-22 MED ORDER — FENTANYL CITRATE 0.05 MG/ML IJ SOLN
INTRAMUSCULAR | Status: AC
  Administered 2014-06-22: 100 ug
  Filled 2014-06-22: qty 2

## 2014-06-22 MED ORDER — LACTATED RINGERS IV SOLN
INTRAVENOUS | Status: DC
  Administered 2014-06-22: 10:00:00 via INTRAVENOUS

## 2014-06-22 MED ORDER — FENTANYL CITRATE 0.05 MG/ML IJ SOLN
INTRAMUSCULAR | Status: AC
  Filled 2014-06-22: qty 2

## 2014-06-22 MED ORDER — BUPIVACAINE-EPINEPHRINE (PF) 0.5% -1:200000 IJ SOLN
INTRAMUSCULAR | Status: DC | PRN
  Administered 2014-06-22: 30 mL

## 2014-06-22 MED ORDER — ONDANSETRON HCL 4 MG/2ML IJ SOLN
INTRAMUSCULAR | Status: AC
  Filled 2014-06-22: qty 2

## 2014-06-22 MED ORDER — ONDANSETRON HCL 4 MG/2ML IJ SOLN: 4.0000 mg | Freq: Four times a day (QID) | INTRAMUSCULAR | Status: DC | PRN

## 2014-06-22 MED ORDER — PHENYLEPHRINE HCL 10 MG/ML IJ SOLN
INTRAMUSCULAR | Status: DC | PRN
  Administered 2014-06-22 (×5): 80 ug via INTRAVENOUS

## 2014-06-22 MED ORDER — FENTANYL CITRATE 0.05 MG/ML IJ SOLN: 50.0000 ug | Freq: Once | INTRAMUSCULAR | Status: DC

## 2014-06-22 MED ORDER — HYDROMORPHONE HCL PF 1 MG/ML IJ SOLN
0.2500 mg | INTRAMUSCULAR | Status: DC | PRN
  Administered 2014-06-22 (×2): 0.5 mg via INTRAVENOUS

## 2014-06-22 MED ORDER — ZOLPIDEM TARTRATE 5 MG PO TABS
5.0000 mg | ORAL_TABLET | Freq: Every evening | ORAL | Status: DC | PRN
  Administered 2014-06-24: 5 mg via ORAL
  Filled 2014-06-22: qty 1

## 2014-06-22 MED ORDER — SODIUM CHLORIDE 0.9 % IJ SOLN
INTRAMUSCULAR | Status: DC | PRN
  Administered 2014-06-22: 12:00:00 via INTRAMUSCULAR

## 2014-06-22 MED ORDER — LIDOCAINE HCL (CARDIAC) 20 MG/ML IV SOLN
INTRAVENOUS | Status: DC | PRN
  Administered 2014-06-22: 60 mg via INTRAVENOUS

## 2014-06-22 MED ORDER — OXYCODONE HCL 5 MG PO TABS
5.0000 mg | ORAL_TABLET | ORAL | Status: DC | PRN
  Administered 2014-06-22 – 2014-06-23 (×4): 5 mg via ORAL
  Filled 2014-06-22 (×4): qty 1

## 2014-06-22 MED ORDER — PANTOPRAZOLE SODIUM 40 MG IV SOLR
40.0000 mg | Freq: Every day | INTRAVENOUS | Status: DC
  Administered 2014-06-22: 40 mg via INTRAVENOUS
  Filled 2014-06-22 (×2): qty 40

## 2014-06-22 MED ORDER — LACTATED RINGERS IV SOLN
INTRAVENOUS | Status: DC | PRN
  Administered 2014-06-22: 11:00:00 via INTRAVENOUS

## 2014-06-22 MED ORDER — OXYCODONE HCL 5 MG/5ML PO SOLN
5.0000 mg | Freq: Once | ORAL | Status: AC | PRN
Start: 2014-06-22 — End: 2014-06-22

## 2014-06-22 SURGICAL SUPPLY — 57 items
APPLIER CLIP 9.375 MED OPEN (MISCELLANEOUS) ×3
BENZOIN TINCTURE PRP APPL 2/3 (GAUZE/BANDAGES/DRESSINGS) ×3 IMPLANT
BINDER BREAST LRG (GAUZE/BANDAGES/DRESSINGS) IMPLANT
BINDER BREAST XLRG (GAUZE/BANDAGES/DRESSINGS) ×3 IMPLANT
BIOPATCH RED 1 DISK 7.0 (GAUZE/BANDAGES/DRESSINGS) ×4 IMPLANT
BIOPATCH RED 1IN DISK 7.0MM (GAUZE/BANDAGES/DRESSINGS) ×2
CANISTER SUCTION 2500CC (MISCELLANEOUS) ×3 IMPLANT
CHLORAPREP W/TINT 26ML (MISCELLANEOUS) ×3 IMPLANT
CLIP APPLIE 9.375 MED OPEN (MISCELLANEOUS) ×1 IMPLANT
CLOSURE WOUND 1/2 X4 (GAUZE/BANDAGES/DRESSINGS) ×1
CONT SPEC 4OZ CLIKSEAL STRL BL (MISCELLANEOUS) ×9 IMPLANT
COVER PROBE W GEL 5X96 (DRAPES) ×3 IMPLANT
COVER SURGICAL LIGHT HANDLE (MISCELLANEOUS) ×3 IMPLANT
DERMABOND ADVANCED (GAUZE/BANDAGES/DRESSINGS) ×6
DERMABOND ADVANCED .7 DNX12 (GAUZE/BANDAGES/DRESSINGS) ×3 IMPLANT
DRAIN CHANNEL 19F RND (DRAIN) ×3 IMPLANT
DRAPE LAPAROSCOPIC ABDOMINAL (DRAPES) ×3 IMPLANT
DRSG PAD ABDOMINAL 8X10 ST (GAUZE/BANDAGES/DRESSINGS) ×3 IMPLANT
DRSG TEGADERM 4X4.75 (GAUZE/BANDAGES/DRESSINGS) ×3 IMPLANT
ELECT BLADE 4.0 EZ CLEAN MEGAD (MISCELLANEOUS) ×3
ELECT CAUTERY BLADE 6.4 (BLADE) ×3 IMPLANT
ELECT REM PT RETURN 9FT ADLT (ELECTROSURGICAL) ×3
ELECTRODE BLDE 4.0 EZ CLN MEGD (MISCELLANEOUS) ×1 IMPLANT
ELECTRODE REM PT RTRN 9FT ADLT (ELECTROSURGICAL) ×1 IMPLANT
EVACUATOR SILICONE 100CC (DRAIN) ×3 IMPLANT
GLOVE BIO SURGEON STRL SZ7 (GLOVE) ×3 IMPLANT
GLOVE BIOGEL PI IND STRL 7.5 (GLOVE) ×1 IMPLANT
GLOVE BIOGEL PI INDICATOR 7.5 (GLOVE) ×2
GOWN STRL REUS W/ TWL LRG LVL3 (GOWN DISPOSABLE) ×3 IMPLANT
GOWN STRL REUS W/TWL LRG LVL3 (GOWN DISPOSABLE) ×6
KIT BASIN OR (CUSTOM PROCEDURE TRAY) ×3 IMPLANT
KIT ROOM TURNOVER OR (KITS) ×3 IMPLANT
MARKER SKIN DUAL TIP RULER LAB (MISCELLANEOUS) ×3 IMPLANT
NEEDLE 18GX1X1/2 (RX/OR ONLY) (NEEDLE) IMPLANT
NEEDLE HYPO 25GX1X1/2 BEV (NEEDLE) IMPLANT
NS IRRIG 1000ML POUR BTL (IV SOLUTION) ×3 IMPLANT
PACK GENERAL/GYN (CUSTOM PROCEDURE TRAY) ×3 IMPLANT
PAD ARMBOARD 7.5X6 YLW CONV (MISCELLANEOUS) ×3 IMPLANT
PIN SAFETY STERILE (MISCELLANEOUS) ×3 IMPLANT
SPECIMEN JAR X LARGE (MISCELLANEOUS) ×3 IMPLANT
SPONGE GAUZE 4X4 12PLY (GAUZE/BANDAGES/DRESSINGS) ×3 IMPLANT
SPONGE GAUZE 4X4 12PLY STER LF (GAUZE/BANDAGES/DRESSINGS) ×3 IMPLANT
SPONGE LAP 18X18 X RAY DECT (DISPOSABLE) ×12 IMPLANT
STAPLER VISISTAT 35W (STAPLE) ×3 IMPLANT
STRIP CLOSURE SKIN 1/2X4 (GAUZE/BANDAGES/DRESSINGS) ×2 IMPLANT
SUT ETHILON 2 0 FS 18 (SUTURE) ×3 IMPLANT
SUT MNCRL AB 4-0 PS2 18 (SUTURE) ×6 IMPLANT
SUT MON AB 4-0 PC3 18 (SUTURE) ×3 IMPLANT
SUT SILK 2 0 SH (SUTURE) ×3 IMPLANT
SUT VIC AB 3-0 54X BRD REEL (SUTURE) ×1 IMPLANT
SUT VIC AB 3-0 BRD 54 (SUTURE) ×2
SUT VIC AB 3-0 SH 18 (SUTURE) IMPLANT
SUT VIC AB 3-0 SH 8-18 (SUTURE) ×9 IMPLANT
SYR CONTROL 10ML LL (SYRINGE) IMPLANT
TOWEL OR 17X24 6PK STRL BLUE (TOWEL DISPOSABLE) IMPLANT
TOWEL OR 17X26 10 PK STRL BLUE (TOWEL DISPOSABLE) ×3 IMPLANT
WATER STERILE IRR 1000ML POUR (IV SOLUTION) IMPLANT

## 2014-06-22 NOTE — Anesthesia Preprocedure Evaluation (Addendum)
Anesthesia Evaluation  Patient identified by MRN, date of birth, ID band Patient awake    Reviewed: Allergy & Precautions, H&P , NPO status , Patient's Chart, lab work & pertinent test results  History of Anesthesia Complications (+) PONVNegative for: history of anesthetic complications  Airway Mallampati: II TM Distance: >3 FB Neck ROM: Full    Dental no notable dental hx. (+) Teeth Intact, Dental Advisory Given, Chipped   Pulmonary neg pulmonary ROS,  breath sounds clear to auscultation  Pulmonary exam normal       Cardiovascular hypertension, negative cardio ROS  Rhythm:Regular Rate:Normal     Neuro/Psych Anxiety negative neurological ROS  negative psych ROS   GI/Hepatic negative GI ROS, Neg liver ROS,   Endo/Other  negative endocrine ROS  Renal/GU negative Renal ROS  negative genitourinary   Musculoskeletal   Abdominal   Peds  Hematology  (+) anemia ,   Anesthesia Other Findings   Reproductive/Obstetrics negative OB ROS                          Anesthesia Physical Anesthesia Plan  ASA: II  Anesthesia Plan: General and Regional   Post-op Pain Management:    Induction: Intravenous  Airway Management Planned: LMA  Additional Equipment:   Intra-op Plan:   Post-operative Plan: Extubation in OR  Informed Consent: I have reviewed the patients History and Physical, chart, labs and discussed the procedure including the risks, benefits and alternatives for the proposed anesthesia with the patient or authorized representative who has indicated his/her understanding and acceptance.   Dental advisory given  Plan Discussed with: CRNA  Anesthesia Plan Comments:         Anesthesia Quick Evaluation

## 2014-06-22 NOTE — Transfer of Care (Signed)
Immediate Anesthesia Transfer of Care Note  Patient: Madeline Davidson  Procedure(s) Performed: Procedure(s): RIGHT TOTAL MASTECTOMY WITH SENTINEL LYMPH NODE BIOPSY (Right)  Patient Location: PACU  Anesthesia Type:General  Level of Consciousness: awake, alert  and oriented  Airway & Oxygen Therapy: Patient Spontanous Breathing and Patient connected to nasal cannula oxygen  Post-op Assessment: Report given to PACU RN, Post -op Vital signs reviewed and stable and Patient moving all extremities  Post vital signs: Reviewed and stable  Complications: No apparent anesthesia complications

## 2014-06-22 NOTE — H&P (Signed)
5 yof who lives in [place] and presents after noting a right breast mass around Christmas. She underwent mm that showed a 3.3 cm lesion in the right breast at 7 oclock. She also had enlarged right axillary node that was biopsied. 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 This is er positive at 90%, pr at 1%, her2 is amplified. Initial MRI showed a 3.8x3.7x3.2 cm mass with 2 intramammary nodes present. There were right axillary nodes also. There is also a 2.4 cm intramammary node on the right side. PET showed results below. Latest MRI shows a 3.6x1.8x1.4 cm area of residual enhancement. She states physician couldn't feel mass soon after chemo began. The left breast is normal. The two enlarged right IM nodes are now normal morphology and size. There is a stable paracardiac node. She has had a difficult time with chemotherapy requiring admission for what ends up being likely urosepsis within last couple weeks. She has completed chemotherapy now. PET shows no real abnormalities.  We have discussed her at conference now also.  She is not candidate for alliance trial.  Past Medical History   Diagnosis  Date   .  Breast cancer  12/16/13     Invasive ductal Carcinoma; 1/1 nodes positive / self palpated   .  Hypertension    .  Allergy    .  Hypertension  01/10/2014   .  Wears glasses     Past Surgical History   Procedure  Laterality  Date   .  Colon resection   2004   .  Hernia repair   2005     umb   .  Cholecystectomy   2005   .  Portacath placement  Left  01/31/2014     Procedure: INSERTION PORT-A-CATH; Surgeon: Rolm Bookbinder, MD; Location: [facility] OR; Service: General; Laterality: Left;    Family History   Problem  Relation  Age of Onset   .  Cancer  Father  53     colon cancer   .  Hypertension  Mother    Social History  History   Substance Use Topics   .  Smoking status:  Never  Smoker   .  Smokeless tobacco:  Never Used   .  Alcohol Use:  No    Allergies   Allergen  Reactions   .  Ciprofloxacin  Other (See Comments)     Patient had a headache after taking Cipro. Made her sinus infection symptoms worse.    Current Outpatient Prescriptions   Medication  Sig  Dispense  Refill   .  B Complex-C (SUPER B COMPLEX PO)  Take 1 tablet by mouth every morning.     .  cetirizine (ZYRTEC) 10 MG tablet  Take 10 mg by mouth daily.     Marland Kitchen  dexamethasone (DECADRON) 4 MG tablet  Take 2 tablets (8 mg total) by mouth 2 (two) times daily with a meal. Take two times a day the day before Taxotere. Then take two times a day starting the day after chemo for 3 days.  30 tablet  1   .  ibuprofen (ADVIL,MOTRIN) 800 MG tablet  Take 800 mg by mouth as needed for moderate pain.     Marland Kitchen  lidocaine-prilocaine (EMLA) cream  Apply topically as needed.  30 g  6   .  ondansetron (ZOFRAN) 8 MG tablet  Take 1  tablet (8 mg total) by mouth 2 (two) times daily. Take two times a day starting the day after chemo for 3 days. Then take two times a day as needed for nausea or vomiting.  30 tablet  1   .  PRESCRIPTION MEDICATION  DOCEtaxel (TAXOTERE) 150 mg in dextrose 5 % 250 mL chemo infusion 75 mg/m2  2.06 m2 (Treatment Plan Actual) Once 04/28/2014     .  PRESCRIPTION MEDICATION  CARBOplatin (PARAPLATIN) 700 mg in sodium chloride 0.9 % 250 mL chemo infusion 700 mg Once 04/28/2014     .  PRESCRIPTION MEDICATION  trastuzumab (HERCEPTIN) 567 mg in sodium chloride 0.9 % 250 mL chemo infusion 6 mg/kg  94.2 kg (Treatment Plan Actual) Once 04/28/2014     .  PRESCRIPTION MEDICATION  pertuzumab (PERJETA) 420 mg in sodium chloride 0.9 % 250 mL chemo infusion 420 mg Once 04/28/2014     .  prochlorperazine (COMPAZINE) 10 MG tablet  Take 1 tablet (10 mg total) by mouth every 6 (six) hours as needed (Nausea or vomiting).  30 tablet  1   .  prochlorperazine (COMPAZINE) 25 MG suppository  Place 1 suppository (25 mg total) rectally every  12 (twelve) hours as needed for nausea or vomiting.  12 suppository  0   .  sulfamethoxazole-trimethoprim (BACTRIM DS) 800-160 MG per tablet  Take 1 tablet by mouth 2 (two) times daily.  14 tablet  0   .  venlafaxine (EFFEXOR) 37.5 MG tablet  Take 1 tablet (37.5 mg total) by mouth 2 (two) times daily.  60 tablet  6   .  zolpidem (AMBIEN CR) 12.5 MG CR tablet  Take 1 tablet (12.5 mg total) by mouth at bedtime as needed for sleep.  30 tablet  0    No current facility-administered medications for this visit.    Facility-Administered Medications Ordered in Other Visits   Medication  Dose  Route  Frequency  Provider  Last Rate  Last Dose   .  sodium chloride 0.9 % injection 10 mL  10 mL  Intracatheter  PRN  [name], MD   10 mL at 02/03/14 1759   Review of Systems  Review of Systems  Constitutional: Positive for fatigue. Negative for fever, chills and unexpected weight change.  HENT: Negative for congestion, hearing loss, sore throat, trouble swallowing and voice change.  Eyes: Negative for visual disturbance.  Respiratory: Positive for shortness of breath. Negative for cough and wheezing.  Cardiovascular: Negative for chest pain, palpitations and leg swelling.  Gastrointestinal: Negative for nausea, vomiting, abdominal pain, diarrhea, constipation, blood in stool, abdominal distention and anal bleeding.   Physical Exam  Physical Exam  Vitals reviewed.  Constitutional: She appears well-developed and well-nourished.  HENT:  Head: Normocephalic.  Eyes: No scleral icterus.  Neck: Neck supple.  Cardiovascular: Normal rate, regular rhythm and normal heart sounds.  Pulmonary/Chest: Effort normal and breath sounds normal. She has no wheezes. Right breast exhibits mass. Right breast exhibits no inverted nipple, no nipple discharge, no skin change and no tenderness. Left breast exhibits no inverted nipple, no mass, no nipple discharge, no skin change and no tenderness.     Data Reviewed    EXAM:  BILATERAL BREAST MRI WITH AND WITHOUT CONTRAST  TECHNIQUE:  Multiplanar, multisequence MR images of both breasts were obtained  prior to and following the intravenous administration of 87ml of  MultiHance.  THREE-DIMENSIONAL MR IMAGE RENDERING ON INDEPENDENT WORKSTATION:  Three-dimensional MR images were rendered by post-processing  of the  original MR data on an independent workstation. The  three-dimensional MR images were interpreted, and findings are  reported in the following complete MRI report for this study. Three  dimensional images were evaluated at the independent DynaCad  workstation  COMPARISON: MRI on 01/25/2014 and imaging from [place], [place]  on 12/31/2013  FINDINGS:  Breast composition: b. Scattered fibroglandular tissue.  Background parenchymal enhancement: Minimal  Right breast: There has been significant improvement and enhancing  mass in the upper-outer quadrant of the right breast. A small area  of residual enhancement now measures 3.6 x 1.8 x 1.4 cm. Previously,  enhancing mass measured 3.8 x 3.7 x 3.2 cm.  Left breast: No mass or abnormal enhancement.  Lymph nodes: The two enlarged intramammary lymph nodes previously  identified in the upper-outer quadrant of the right breast are  currently visible but show normal morphology and size. No suspicious  axillary or internal mammary lymph nodes are identified. Stable  right paracardiac lymph node is identified, measuring 8 mm in  diameter.  Ancillary findings: None.  IMPRESSION:  1. Significant reduction in size of right breast mass in the upper  outer quadrant.  2. Involved right intramammary lymph nodes now have normal  appearance and morphology.   Assessment  Clinical stage II right breast cancer s/p primary chemotherapy  Plan   Right total mastectomy, right axillary sentinel node biopsy  We discussed her at our breast conference. The consensus between medical, radiation oncology was to  proceed with a mastectomy to ensure those intramammary nodes that were positive the forward be the best plan for her. I do think it would be reasonable to consider this. I discussed with her today this would be removing the nipple and the areola. She had a plastic surgery appt already and has decided not to do reconstruction. I also discussed with her a sentinel lymph node biopsy at the same time. I did discuss the risks benefits and the postoperative appearance of a mastectomy without reconstruction.

## 2014-06-22 NOTE — Anesthesia Procedure Notes (Addendum)
Anesthesia Regional Block:  Pectoralis block  Pre-Anesthetic Checklist: ,, timeout performed, Correct Patient, Correct Site, Correct Laterality, Correct Procedure, Correct Position, site marked, Risks and benefits discussed, pre-op evaluation, post-op pain management  Laterality: Right  Prep: Maximum Sterile Barrier Precautions used and chloraprep       Needles:  Injection technique: Single-shot  Needle Type: Echogenic Stimulator Needle     Needle Length: 9cm 9 cm Needle Gauge: 21 and 21 G    Additional Needles:  Procedures: ultrasound guided (picture in chart) Pectoralis block Narrative:  Start time: 06/22/2014 10:00 AM End time: 06/22/2014 10:25 AM Injection made incrementally with aspirations every 5 mL. Anesthesiologist: Fitzgerald,MD  Additional Notes: 2% Lidocaine skin wheel.   Procedure Name: LMA Insertion Date/Time: 06/22/2014 12:04 PM Performed by: Maeola Harman Pre-anesthesia Checklist: Patient identified, Emergency Drugs available, Suction available, Patient being monitored and Timeout performed Patient Re-evaluated:Patient Re-evaluated prior to inductionOxygen Delivery Method: Circle system utilized Preoxygenation: Pre-oxygenation with 100% oxygen LMA: LMA inserted LMA Size: 4.0 Number of attempts: 1 Placement Confirmation: positive ETCO2 and breath sounds checked- equal and bilateral Tube secured with: Tape Dental Injury: Teeth and Oropharynx as per pre-operative assessment

## 2014-06-23 LAB — BASIC METABOLIC PANEL
Anion gap: 13 (ref 5–15)
BUN: 12 mg/dL (ref 6–23)
CHLORIDE: 105 meq/L (ref 96–112)
CO2: 24 meq/L (ref 19–32)
Calcium: 8.8 mg/dL (ref 8.4–10.5)
Creatinine, Ser: 0.67 mg/dL (ref 0.50–1.10)
GFR calc Af Amer: >90 mL/min (ref >90)
GFR calc non Af Amer: >90 mL/min (ref >90)
Glucose, Bld: 110 mg/dL — ABNORMAL HIGH (ref 70–99)
POTASSIUM: 4.3 meq/L (ref 3.7–5.3)
Sodium: 142 mEq/L (ref 137–147)

## 2014-06-23 LAB — CBC
HEMATOCRIT: 23.2 % — AB (ref 36.0–46.0)
Hemoglobin: 7.4 g/dL — ABNORMAL LOW (ref 12.0–15.0)
MCH: 31.8 pg (ref 26.0–34.0)
MCHC: 31.9 g/dL (ref 30.0–36.0)
MCV: 99.6 fL (ref 78.0–100.0)
PLATELETS: 230 10*3/uL (ref 150–400)
RBC: 2.33 MIL/uL — AB (ref 3.87–5.11)
RDW: 17 % — ABNORMAL HIGH (ref 11.5–15.5)
WBC: 9 10*3/uL (ref 4.0–10.5)

## 2014-06-23 MED ORDER — OXYCODONE HCL 5 MG PO TABS: 5.0000 mg | ORAL_TABLET | ORAL | Status: DC | PRN

## 2014-06-23 MED ORDER — PANTOPRAZOLE SODIUM 40 MG PO TBEC
40.0000 mg | DELAYED_RELEASE_TABLET | Freq: Every day | ORAL | Status: DC
  Administered 2014-06-23: 40 mg via ORAL
  Filled 2014-06-23: qty 1

## 2014-06-23 NOTE — Op Note (Signed)
Preoperative diagnosis: clinical stage 2 right breast cancer s/p primary chemotherapy Postoperative diagnosis: same as above Procedure: 1. Right total mastectomy 2. Injection of blue dye for sentinel node identification 3. Right axillary sentinel node biopsy times three  Surgeon: Dr Serita Grammes EBL: 150cc Anesthesia: general with right pectoral block Specimen: 1. Right breast short superior, long lateral 2. Right axillary sentinel node times three with counts of 149, 89, 38 Sponge and needle count correct at completion Dispo to recovery stable  Indications: This is a 41 yof who had clinical stage II right breast cancer with positive intramammary node that was her2 positive at diagnosis. She has undergone primary chemotherapy which she has had a lot of trouble with but a good response.  We have discussed all options and at our conference.  We have decided to do mastectomy and sentinel node biopsy as her axillary nodes were not involved previously.    Procedure: After informed consent was obtained she was taken to the operating room.  She had technetium injected in periareolar fashion.  She was given cefazolin. Sequential compression devices were on her legs.  She underwent a pectoral block.  She was placed under general anesthesia without complication.  A surgical timeout was performed.  I then injected a mixture of saline and blue dye under the areola.  She was then prepped and draped in the standard sterile surgical fashion.  I then made a large elliptical incision to encompass the nipple and areola as she has a very large breast.  I then created flaps to clavicle, sternum, inframammary crease and the latissimus.  This was somewhat difficult and I oversewed multiple vessels which accounts for the blood loss.  I then removed the breast from the pectoralis muscle, marked it and passed off table.  The intramammary nodes were visible and these were not hot with the neoprobe.  I then remove three  sentinel nodes from the axilla with counts above.  There was no background radioactivity.  All three of these nodes were negative on intraoperative pathology.  I then placed 2 19 Fr Blake drains.  Hemostasis was obtained.  I then closed this with 3-0 vicryl  There was a large amount of excess skin laterally and I removed this and performed a VY plasty.  The incision was then closed with 4-0 monocryl.  dermabond and steristrips were placed.  A breast binder was placed also.  She tolerated this well, was extubated and transferred to recovery stable.

## 2014-06-23 NOTE — Anesthesia Postprocedure Evaluation (Signed)
  Anesthesia Post-op Note  Patient: Madeline Davidson  Procedure(s) Performed: Procedure(s): RIGHT TOTAL MASTECTOMY WITH SENTINEL LYMPH NODE BIOPSY (Right)  Patient Location: PACU  Anesthesia Type:General and block  Level of Consciousness: awake and alert   Airway and Oxygen Therapy: Patient Spontanous Breathing  Post-op Pain: mild  Post-op Assessment: Post-op Vital signs reviewed, Patient's Cardiovascular Status Stable and Respiratory Function Stable  Post-op Vital Signs: Reviewed  Filed Vitals:   06/23/14 0544  BP: 105/66  Pulse: 80  Temp: 36.6 C  Resp: 16    Complications: No apparent anesthesia complications

## 2014-06-23 NOTE — Discharge Instructions (Signed)
[facility] °[phone] ° °MASTECTOMY: POST OP INSTRUCTIONS ° °Always review your discharge instruction sheet given to you by the facility where your surgery was performed. °IF YOU HAVE DISABILITY OR FAMILY LEAVE FORMS, YOU MUST BRING THEM TO THE OFFICE FOR PROCESSING.   °DO NOT GIVE THEM TO YOUR DOCTOR. °A prescription for pain medication may be given to you upon discharge.  Take your pain medication as prescribed, if needed.  If narcotic pain medicine is not needed, then you may take acetaminophen (Tylenol), naprosyn (Alleve) or ibuprofen (Advil) as needed. °1. Take your usually prescribed medications unless otherwise directed. °2. If you need a refill on your pain medication, please contact your pharmacy.  They will contact our office to request authorization.  Prescriptions will not be filled after 5pm or on week-ends. °3. You should follow a light diet the first few days after arrival home, such as soup and crackers, etc.  Resume your normal diet the day after surgery. °4. Most patients will experience some swelling and bruising on the chest and underarm.  Ice packs will help.  Swelling and bruising can take several days to resolve. Wear the binder day and night until you return to the office.  °5. It is common to experience some constipation if taking pain medication after surgery.  Increasing fluid intake and taking a stool softener (such as Colace) will usually help or prevent this problem from occurring.  A mild laxative (Milk of Magnesia or Miralax) should be taken according to package instructions if there are no bowel movements after 48 hours. °6. Unless discharge instructions indicate otherwise, leave your bandage dry and in place until your next appointment in 3-5 days.  You may take a limited sponge bath.  No tube baths or showers until the drains are removed.  You may have steri-strips (small skin tapes) in place directly over the incision.  These strips should be left on the  skin for 7-10 days. If you have glue it will come off in next couple week.  Any sutures will be removed at an office visit °7. DRAINS:  If you have drains in place, it is important to keep a list of the amount of drainage produced each day in your drains.  Before leaving the hospital, you should be instructed on drain care.  Call our office if you have any questions about your drains. I will remove your drains when they put out less than 30 cc or ml for 2 consecutive days. °8. ACTIVITIES:  You may resume regular (light) daily activities beginning the next day--such as daily self-care, walking, climbing stairs--gradually increasing activities as tolerated.  You may have sexual intercourse when it is comfortable.  Refrain from any heavy lifting or straining until approved by your doctor. °a. You may drive when you are no longer taking prescription pain medication, you can comfortably wear a seatbelt, and you can safely maneuver your car and apply brakes. °b. RETURN TO WORK:  __________________________________________________________ °9. You should see your doctor in the office for a follow-up appointment approximately 3-5 days after your surgery.  Your doctor’s nurse will typically make your follow-up appointment when she calls you with your pathology report.  Expect your pathology report 3-4business days after surgery. °10. OTHER INSTRUCTIONS: ______________________________________________________________________________________________ ____________________________________________________________________________________________ °WHEN TO CALL YOUR DR Ezio Wieck: °1. Fever over 101.0 °2. Nausea and/or vomiting °3. Extreme swelling or bruising °4. Continued bleeding from incision. °5. Increased pain, redness, or drainage from the incision. °The clinic staff is available   to answer your questions during regular business hours.  Please don’t hesitate to call and ask to speak to one of the nurses for clinical concerns.  If  you have a medical emergency, go to the nearest emergency room or call 911.  A surgeon from [facility] is always on call at the hospital. °[address], [address], [place], [place]  [address] ? [address] [address], [place], [place]   [address] °[phone] ? [phone] ? FAX [phone] °Web site: [email or web address] ° °

## 2014-06-23 NOTE — Progress Notes (Signed)
1 Day Post-Op  Subjective: Pain controlled, oob, ambulating some, voiding, tol diet  Objective: Vital signs in last 24 hours: Temp:  [97.8 F (36.6 C)-98.8 F (37.1 C)] 97.8 F (36.6 C) (07/02 0544) Pulse Rate:  [64-129] 80 (07/02 0544) Resp:  [11-20] 16 (07/02 0544) BP: (105-150)/(59-79) 105/66 mmHg (07/02 0544) SpO2:  [83 %-100 %] 99 % (07/02 0544) Weight:  [188 lb (85.276 kg)-193 lb 12.6 oz (87.9 kg)] 193 lb 12.6 oz (87.9 kg) (07/01 1652) Last BM Date: 06/22/14 (prior to surgery)  Intake/Output from previous day: 07/01 0701 - 07/02 0700 In: 1912 [P.O.:180; I.V.:1700] Out: 875 [Urine:450; Drains:125; Blood:300] Intake/Output this shift:    General appearance: no distress Resp: clear to auscultation bilaterally Cardio: regular rate and rhythm Incision/Wound:incision clean without infection, drains as expected serosang, no hematoma  Lab Results:   Recent Labs  06/20/14 1018 06/23/14 0338  WBC 8.0 9.0  HGB 10.3* 7.4*  HCT 32.5* 23.2*  PLT 300 230   BMET  Recent Labs  06/20/14 1018 06/23/14 0338  NA 144 142  K 4.0 4.3  CL 102 105  CO2 26 24  GLUCOSE 109* 110*  BUN 10 12  CREATININE 0.73 0.67  CALCIUM 9.6 8.8   PT/INR No results found for this basename: LABPROT, INR,  in the last 72 hours ABG No results found for this basename: PHART, PCO2, PO2, HCO3,  in the last 72 hours  Studies/Results: Nm Sentinel Node Inj-no Rpt (breast)  06/22/2014   CLINICAL DATA: left axillary sentinel node biopsy   Sulfur colloid was injected intradermally by the nuclear medicine  technologist for breast cancer sentinel node localization.     Anti-infectives: Anti-infectives   Start     Dose/Rate Route Frequency Ordered Stop   06/22/14 2000  ceFAZolin (ANCEF) IVPB 2 g/50 mL premix     2 g 100 mL/hr over 30 Minutes Intravenous 3 times per day 06/22/14 1622 06/23/14 2159   06/22/14 0600  ceFAZolin (ANCEF) IVPB 2 g/50 mL premix     2 g 100 mL/hr over 30 Minutes Intravenous  On call to O.R. 06/21/14 1426 06/22/14 1204      Assessment/Plan: POD 1 right total mastectomy, right ax snbx 1. Continue po pain control with iv backup 2. Ambulate, pulm toilet, oob 3. Anemia- preexisting, received some blood during chemo but exacerbated with surgery, not actively bleeding, will remain here 24 hours to make sure ok and for pain control 4. Plan dc in am tomorrow if doing well  [facility] 06/23/2014

## 2014-06-24 LAB — CBC
HEMATOCRIT: 25.5 % — AB (ref 36.0–46.0)
Hemoglobin: 7.9 g/dL — ABNORMAL LOW (ref 12.0–15.0)
MCH: 32.1 pg (ref 26.0–34.0)
MCHC: 31 g/dL (ref 30.0–36.0)
MCV: 103.7 fL — ABNORMAL HIGH (ref 78.0–100.0)
PLATELETS: 242 10*3/uL (ref 150–400)
RBC: 2.46 MIL/uL — ABNORMAL LOW (ref 3.87–5.11)
RDW: 17.7 % — AB (ref 11.5–15.5)
WBC: 7.7 10*3/uL (ref 4.0–10.5)

## 2014-06-24 NOTE — Progress Notes (Signed)
UR Completed.  Madeline Davidson Jane [phone] 06/24/2014  

## 2014-06-24 NOTE — Discharge Summary (Signed)
Physician Discharge Summary [facility]  Patient ID: Madeline Davidson MRN: [id number] DOB/AGE: [birth year] 58 y.o.  Admit date: 06/22/2014 Discharge date: 06/24/2014  Admission Diagnoses:  Right breast carcinoma  Discharge Diagnoses:  Active Problems:   Breast cancer of upper-outer quadrant of right female breast   Discharged Condition: good  Hospital Course: patient admitted after right mastectomy with sentinel lymph node biopsy.  Post op course uncomplicated.  Prepared for discharge home on POD#2.  Consults: None  Treatments: surgery: right mastectomy with sentinel lymph node biopsy  Discharge Exam: Blood pressure 104/66, pulse 81, temperature 98.2 F (36.8 C), temperature source Oral, resp. rate 17, height 5\' 4"  (1.626 m), weight 193 lb 12.6 oz (87.9 kg), SpO2 99.00%. HEENT - clear Neck - soft Chest - clear bilaterally; mild ecchymosis upper axilla on right; upper skin flap viable; drains with thin serous output Cor - RRR  Disposition: Home  Discharge Instructions   Diet - low sodium heart healthy    Complete by:  As directed      Diet - low sodium heart healthy    Complete by:  As directed      Increase activity slowly    Complete by:  As directed      Increase activity slowly    Complete by:  As directed      Remove dressing in 24 hours    Complete by:  As directed             Medication List         acetaminophen 500 MG tablet  Commonly known as:  TYLENOL  Take 1,000 mg by mouth every 6 (six) hours as needed for mild pain.     cetirizine 10 MG tablet  Commonly known as:  ZYRTEC  Take 10 mg by mouth daily as needed for allergies.     clonazePAM 0.5 MG tablet  Commonly known as:  KLONOPIN  Take 0.5 mg by mouth 2 (two) times daily as needed for anxiety.     lidocaine-prilocaine cream  Commonly known as:  EMLA  Apply 1 application topically as needed (for infusion site).     ondansetron 8 MG disintegrating tablet  Commonly known  as:  ZOFRAN-ODT  Take 8 mg by mouth every 8 (eight) hours as needed for nausea or vomiting.     oxyCODONE 5 MG immediate release tablet  Commonly known as:  Oxy IR/ROXICODONE  Take 1-2 tablets (5-10 mg total) by mouth every 4 (four) hours as needed for moderate pain.     potassium chloride SA 20 MEQ tablet  Commonly known as:  K-DUR,KLOR-CON  Take 20 mEq by mouth daily.     prochlorperazine 10 MG tablet  Commonly known as:  COMPAZINE  Take 1 tablet (10 mg total) by mouth every 6 (six) hours as needed (Nausea or vomiting).     SUPER B COMPLEX PO  Take 1 tablet by mouth every morning.     trastuzumab 2 mg/kg in sodium chloride 0.9 % 250 mL  Inject 2 mg/kg into the vein every 21 ( twenty-one) days. Every three weeks.     zolpidem 12.5 MG CR tablet  Commonly known as:  AMBIEN CR  Take 1 tablet (12.5 mg total) by mouth at bedtime as needed for sleep.           Follow-up Information   Follow up with [facility], MD In 1 week.   Specialty:  General Surgery   Contact information:   [phone]  [address] [place] [address] [phone]       Schedule an appointment as soon as possible for a visit with Rolm Bookbinder, MD. (For wound re-check)    Specialty:  General Surgery   Contact information:   [address] [place] [address] [phone]       Earnstine Regal, MD, [facility] Office: [phone]   Signed: Earnstine Regal 06/24/2014, 9:29 AM

## 2014-06-24 NOTE — Progress Notes (Signed)
AVS discharge instructions were given and went over with patient. Patient was also given prescription for oxycodone to take to her pharmacy. Patient was also given printed information on how to care for, empty and record drainage from her JP drains.  It was also demostrated to patient on yesterday how to drain, measure, and record drainage from [place]. Patient stated that she felt comfortable with draining her drains at home. She also stated that she has had JP drains in past and is familiar with them. Now patient is waiting for her daughter to come and pick her up.

## 2014-06-27 ENCOUNTER — Encounter (HOSPITAL_COMMUNITY): Payer: Self-pay | Admitting: General Surgery

## 2014-06-27 ENCOUNTER — Telehealth (INDEPENDENT_AMBULATORY_CARE_PROVIDER_SITE_OTHER): Payer: Self-pay | Admitting: General Surgery

## 2014-06-27 NOTE — Telephone Encounter (Signed)
Spoke with patient and informed her that I have her scheduled to see Dr. Donne Hazel on 06/28/14 at 2:50.

## 2014-06-28 ENCOUNTER — Ambulatory Visit (INDEPENDENT_AMBULATORY_CARE_PROVIDER_SITE_OTHER): Payer: BC Managed Care – PPO | Admitting: General Surgery

## 2014-06-28 ENCOUNTER — Encounter (INDEPENDENT_AMBULATORY_CARE_PROVIDER_SITE_OTHER): Payer: Self-pay | Admitting: General Surgery

## 2014-06-28 DIAGNOSIS — Z09 Encounter for follow-up examination after completed treatment for conditions other than malignant neoplasm: Secondary | ICD-10-CM

## 2014-06-28 MED ORDER — NYSTATIN 100000 UNIT/ML MT SUSP: 5.0000 mL | Freq: Four times a day (QID) | OROMUCOSAL | Status: DC

## 2014-06-28 NOTE — Progress Notes (Signed)
Subjective:     Patient ID: Madeline Davidson, female   DOB: [birth year], 58 y.o.   MRN: [id number]  HPI 58 yof who underwent primary chemotherapy for right breast cancer with positive intramammary node at beginning. Axillary nodes appeared negative.  She had very good response on mr after therapy. She had a difficult time with chemotherapy.  We discussed at multidisciplinary conference and decided to recommend mastectomy and thought that sentinel node would be reasonable also.  She underwent mastectomy with snbx.  Her final path shows no residual tumor.  There is a 3 cm stellate area with treatment change.  Three sentinel nodes are negative for tumor. There were 7 nodes associated with her breast that are negative.  She did not have ax dissection technically although she had 10 nodes removed.Her drains are putting out over than 30 cc per day still.  She only complains of some white exudate on her tongue.   Review of Systems     Objective:   Physical Exam Y shaped right mastectomy incision clean without infection, some ecchymosis at superior flap, viable Drains with serous fluid, no infection    Assessment:     Primary chemo now s/p right mastectomy/snbx    Plan:     I will remove drains hopefully next week.  She is going to begin exercises.  I will make sure she has rad onc appt now.  I think it would be reasonable given no axillary disease at beginning and now to omit radiation therapy but will get Dr Caren Hazy opinion.  If she does get radiation she would like to do in [place].  I also gave her rx for nystatin as it appears she has thrush.

## 2014-07-01 ENCOUNTER — Ambulatory Visit (HOSPITAL_BASED_OUTPATIENT_CLINIC_OR_DEPARTMENT_OTHER): Payer: BC Managed Care – PPO

## 2014-07-01 ENCOUNTER — Other Ambulatory Visit (HOSPITAL_BASED_OUTPATIENT_CLINIC_OR_DEPARTMENT_OTHER): Payer: BC Managed Care – PPO

## 2014-07-01 ENCOUNTER — Ambulatory Visit (HOSPITAL_BASED_OUTPATIENT_CLINIC_OR_DEPARTMENT_OTHER): Payer: BC Managed Care – PPO | Admitting: Oncology

## 2014-07-01 VITALS — BP 121/67 | HR 103 | Temp 98.5°F | Resp 18 | Ht 64.0 in | Wt 187.5 lb

## 2014-07-01 DIAGNOSIS — C773 Secondary and unspecified malignant neoplasm of axilla and upper limb lymph nodes: Secondary | ICD-10-CM

## 2014-07-01 DIAGNOSIS — C50411 Malignant neoplasm of upper-outer quadrant of right female breast: Secondary | ICD-10-CM

## 2014-07-01 DIAGNOSIS — I952 Hypotension due to drugs: Secondary | ICD-10-CM

## 2014-07-01 DIAGNOSIS — C50919 Malignant neoplasm of unspecified site of unspecified female breast: Secondary | ICD-10-CM

## 2014-07-01 DIAGNOSIS — C50419 Malignant neoplasm of upper-outer quadrant of unspecified female breast: Secondary | ICD-10-CM

## 2014-07-01 DIAGNOSIS — R000 Tachycardia, unspecified: Secondary | ICD-10-CM

## 2014-07-01 DIAGNOSIS — G4700 Insomnia, unspecified: Secondary | ICD-10-CM

## 2014-07-01 DIAGNOSIS — Z170 Estrogen receptor positive status [ER+]: Secondary | ICD-10-CM

## 2014-07-01 DIAGNOSIS — Z5112 Encounter for antineoplastic immunotherapy: Secondary | ICD-10-CM

## 2014-07-01 LAB — COMPREHENSIVE METABOLIC PANEL (CC13)
ALBUMIN: 3 g/dL — AB (ref 3.5–5.0)
ALK PHOS: 68 U/L (ref 40–150)
ALT: 13 U/L (ref 0–55)
AST: 14 U/L (ref 5–34)
Anion Gap: 11 mEq/L (ref 3–11)
BUN: 13.5 mg/dL (ref 7.0–26.0)
CALCIUM: 9.6 mg/dL (ref 8.4–10.4)
CHLORIDE: 108 meq/L (ref 98–109)
CO2: 25 mEq/L (ref 22–29)
Creatinine: 0.8 mg/dL (ref 0.6–1.1)
Glucose: 118 mg/dl (ref 70–140)
POTASSIUM: 3.5 meq/L (ref 3.5–5.1)
SODIUM: 144 meq/L (ref 136–145)
TOTAL PROTEIN: 6.2 g/dL — AB (ref 6.4–8.3)
Total Bilirubin: 0.35 mg/dL (ref 0.20–1.20)

## 2014-07-01 LAB — CBC WITH DIFFERENTIAL/PLATELET
BASO%: 0.7 % (ref 0.0–2.0)
BASOS ABS: 0.1 10*3/uL (ref 0.0–0.1)
EOS%: 3.5 % (ref 0.0–7.0)
Eosinophils Absolute: 0.2 10*3/uL (ref 0.0–0.5)
HCT: 27 % — ABNORMAL LOW (ref 34.8–46.6)
HEMOGLOBIN: 8.8 g/dL — AB (ref 11.6–15.9)
LYMPH#: 1.1 10*3/uL (ref 0.9–3.3)
LYMPH%: 15.2 % (ref 14.0–49.7)
MCH: 32.1 pg (ref 25.1–34.0)
MCHC: 32.7 g/dL (ref 31.5–36.0)
MCV: 98.2 fL (ref 79.5–101.0)
MONO#: 0.4 10*3/uL (ref 0.1–0.9)
MONO%: 6.1 % (ref 0.0–14.0)
NEUT#: 5.2 10*3/uL (ref 1.5–6.5)
NEUT%: 74.5 % (ref 38.4–76.8)
Platelets: 263 10*3/uL (ref 145–400)
RBC: 2.75 10*6/uL — AB (ref 3.70–5.45)
RDW: 17.6 % — AB (ref 11.2–14.5)
WBC: 7 10*3/uL (ref 3.9–10.3)

## 2014-07-01 MED ORDER — ACETAMINOPHEN 325 MG PO TABS
650.0000 mg | ORAL_TABLET | Freq: Once | ORAL | Status: AC
  Administered 2014-07-01: 650 mg via ORAL

## 2014-07-01 MED ORDER — ZOLPIDEM TARTRATE ER 12.5 MG PO TBCR: 12.5000 mg | EXTENDED_RELEASE_TABLET | Freq: Every evening | ORAL | Status: DC | PRN

## 2014-07-01 MED ORDER — DIPHENHYDRAMINE HCL 25 MG PO CAPS
25.0000 mg | ORAL_CAPSULE | Freq: Once | ORAL | Status: AC
  Administered 2014-07-01: 25 mg via ORAL

## 2014-07-01 MED ORDER — ACETAMINOPHEN 325 MG PO TABS
ORAL_TABLET | ORAL | Status: AC
  Filled 2014-07-01: qty 2

## 2014-07-01 MED ORDER — ANASTROZOLE 1 MG PO TABS: 1.0000 mg | ORAL_TABLET | Freq: Every day | ORAL | Status: DC

## 2014-07-01 MED ORDER — TRASTUZUMAB CHEMO INJECTION 440 MG
6.0000 mg/kg | Freq: Once | INTRAVENOUS | Status: AC
  Administered 2014-07-01: 525 mg via INTRAVENOUS
  Filled 2014-07-01: qty 25

## 2014-07-01 MED ORDER — HEPARIN SOD (PORK) LOCK FLUSH 100 UNIT/ML IV SOLN
500.0000 [IU] | Freq: Once | INTRAVENOUS | Status: AC | PRN
  Administered 2014-07-01: 500 [IU]
  Filled 2014-07-01: qty 5

## 2014-07-01 MED ORDER — SODIUM CHLORIDE 0.9 % IJ SOLN
10.0000 mL | INTRAMUSCULAR | Status: DC | PRN
  Administered 2014-07-01: 10 mL
  Filled 2014-07-01: qty 10

## 2014-07-01 MED ORDER — DIPHENHYDRAMINE HCL 25 MG PO CAPS
ORAL_CAPSULE | ORAL | Status: AC
  Filled 2014-07-01: qty 1

## 2014-07-01 MED ORDER — SODIUM CHLORIDE 0.9 % IV SOLN
Freq: Once | INTRAVENOUS | Status: AC
  Administered 2014-07-01: 10:00:00 via INTRAVENOUS

## 2014-07-01 NOTE — Patient Instructions (Signed)
[facility] Discharge Instructions for Patients Receiving Chemotherapy  Today you received the following chemotherapy agent: Herceptin   To help prevent nausea and vomiting after your treatment, we encourage you to take your nausea medication as prescribed.    If you develop nausea and vomiting that is not controlled by your nausea medication, call the clinic.   BELOW ARE SYMPTOMS THAT SHOULD BE REPORTED IMMEDIATELY:  *FEVER GREATER THAN 100.5 F  *CHILLS WITH OR WITHOUT FEVER  NAUSEA AND VOMITING THAT IS NOT CONTROLLED WITH YOUR NAUSEA MEDICATION  *UNUSUAL SHORTNESS OF BREATH  *UNUSUAL BRUISING OR BLEEDING  TENDERNESS IN MOUTH AND THROAT WITH OR WITHOUT PRESENCE OF ULCERS  *URINARY PROBLEMS  *BOWEL PROBLEMS  UNUSUAL RASH Items with * indicate a potential emergency and should be followed up as soon as possible.  Feel free to call the clinic you have any questions or concerns. The clinic phone number is [phone].    

## 2014-07-01 NOTE — Progress Notes (Signed)
Patient: Madeline Davidson   MR#: [id number]   CSN#: [id number]  07/01/2014 9:18 AM  PCP: [name], MD GYN: SU: Rolm Bookbinder MD OTHER MD: Thea Silversmith MD  CHIEF COMPLAINT: HER-2 positive/ ER positive breast cancer  CURRENT TREATMENT: anti-HER-2 immunotherapy  BREAST CANCER HISTORY: As per  Dr Dana Allan original intake note:note:  " Patient on 12/16/2013 felt a lump in her right breast. She states it was nickel size. Because of this she 1 on to see her nurse practitioner. On 12/31/2013 a mammogram was ordered. This showed a lesion in the 7:00 position measuring 3.3 cm. She was also noted to have enlarged lymph nodes. Subsequent ultrasound showed a 2.2 x 2.4 cm mass with enlarged lymph node measuring 1.6 cm. This was biopsied in [place]. 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 Lymph node was positive for metastatic disease. Prognostic markers revealed the tumor to be estrogen receptor +90% progesterone receptor 1% HER-2/neu positive."  Her subsequent history is as detailed below.  Interim History:    Madeline Davidson returns today for followup of her breast cancer. She is establishing herself on my service today. Since her last visit here she underwent right mastectomy and sentinel lymph node sampling 06/22/2014. This showed ([id number]) a complete pathologic response, with no residual tumor in the breast and all 10 lymph nodes including 3 sentinel lymph nodes clear. The patient is here to discuss adjuvant therapy.  ROS: Maezie did well with her surgery, without unusual bleeding, fever, pain, or rash. She does have some fatigue and some general discomfort of course and is not yet ready to go for rehabilitation. Otherwise a detailed review of systems today was noncontributory.  Medications:  Current Outpatient Prescriptions  Medication Sig Dispense Refill  . acetaminophen (TYLENOL) 500 MG tablet Take 1,000 mg by mouth every 6 (six) hours as needed for mild  pain.      . B Complex-C (SUPER B COMPLEX PO) Take 1 tablet by mouth every morning.      . cetirizine (ZYRTEC) 10 MG tablet Take 10 mg by mouth daily as needed for allergies.      . clonazePAM (KLONOPIN) 0.5 MG tablet Take 0.5 mg by mouth 2 (two) times daily as needed for anxiety.      . lidocaine-prilocaine (EMLA) cream Apply 1 application topically as needed (for infusion site).      . nystatin (MYCOSTATIN) 100000 UNIT/ML suspension Take 5 mLs (500,000 Units total) by mouth 4 (four) times daily.  60 mL  0  . ondansetron (ZOFRAN-ODT) 8 MG disintegrating tablet Take 8 mg by mouth every 8 (eight) hours as needed for nausea or vomiting.      Marland Kitchen oxyCODONE (OXY IR/ROXICODONE) 5 MG immediate release tablet Take 1-2 tablets (5-10 mg total) by mouth every 4 (four) hours as needed for moderate pain.  30 tablet  0  . potassium chloride SA (K-DUR,KLOR-CON) 20 MEQ tablet Take 20 mEq by mouth daily.      . prochlorperazine (COMPAZINE) 10 MG tablet Take 1 tablet (10 mg total) by mouth every 6 (six) hours as needed (Nausea or vomiting).  30 tablet  1  . trastuzumab 2 mg/kg in sodium chloride 0.9 % 250 mL Inject 2 mg/kg into the vein every 21 ( twenty-one) days. Every three weeks.      Marland Kitchen zolpidem (AMBIEN CR) 12.5 MG CR tablet Take 1 tablet (12.5 mg total) by mouth at bedtime as needed for sleep.  30 tablet  0   No current  facility-administered medications for this visit.     Allergies:  Allergies  Allergen Reactions  . Ciprofloxacin Other (See Comments)    Patient had a headache after taking Cipro. Made her sinus infection symptoms worse.    Medical History: Past Medical History  Diagnosis Date  . Allergy   . Wears glasses   . 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 PONV (postoperative nausea and vomiting)   . Anxiety   . History of blood transfusion ~ 04/2014    "related to chemo"  . Anemia ~ 04/2014  . Arthritis     back    Surgical History:   Past Surgical History  Procedure Laterality Date  . Colon resection  2004    "for abscess on my colon"  . Hernia repair  2005    umb  . Cholecystectomy  2005  . Portacath placement Left 01/31/2014    Procedure: INSERTION PORT-A-CATH;  Surgeon: Rolm Bookbinder, MD;  Location: [facility];  Service: General;  Laterality: Left;  . Axillary lymph node biopsy Right 12/2013  . Breast biopsy Right 12/2013  . Colon surgery    . Mastectomy w/ sentinel node biopsy Right 06/22/2014    Procedure: RIGHT TOTAL MASTECTOMY WITH SENTINEL LYMPH NODE BIOPSY;  Surgeon: Rolm Bookbinder, MD;  Location: [facility];  Service: General;  Laterality: Right;   Gyn HISTORY:  SOCIAL HISTORY:   Physical Exam: middle-aged white woman who appears stated age 58 Vitals:   07/01/14 0852  BP: 121/67  Pulse: 103  Temp: 98.5 F (36.9 C)  Resp: 18   ECOG PERFORMANCE STATUS: 1 - Symptomatic but completely ambulatory  Sclerae unicteric, pupils equal and reactive Oropharynx clear and moist-- no thrush No cervical or supraclavicular adenopathy Lungs no rales or rhonchi Heart regular rate and rhythm Abd soft,  Obese,nontender, positive bowel sounds MSK no focal spinal tenderness, no upper extremity lymphedema, limited range of motion on right Neuro: nonfocal, well oriented, appropriate affect Breasts: The right breast bandage was not removed. The right axilla is benign. The left breast is unremarkable   Lab Results: Lab Results  Component Value Date   WBC 7.0 07/01/2014   HGB 8.8* 07/01/2014   HCT 27.0* 07/01/2014   MCV 98.2 07/01/2014   PLT 263 07/01/2014     Chemistry      Component Value Date/Time   NA 144 07/01/2014 0821   NA 142 06/23/2014 0338   K 3.5 07/01/2014 0821   K 4.3 06/23/2014 0338   CL 105 06/23/2014 0338   CO2 25 07/01/2014 0821   CO2 24 06/23/2014 0338   BUN 13.5 07/01/2014 0821   BUN 12 06/23/2014 0338   CREATININE 0.8 07/01/2014 0821   CREATININE 0.67 06/23/2014 0338      Component Value Date/Time    CALCIUM 9.6 07/01/2014 0821   CALCIUM 8.8 06/23/2014 0338   ALKPHOS 68 07/01/2014 0821   AST 14 07/01/2014 0821   ALT 13 07/01/2014 0821   BILITOT 0.35 07/01/2014 0821     Studies: Nm Sentinel Node Inj-no Rpt (breast)  06/22/2014   CLINICAL DATA: left axillary sentinel node biopsy   Sulfur colloid was injected intradermally by the nuclear medicine  technologist for breast cancer sentinel node localization.    Assessment: 58 y.o. [place], [place] woman status post right breast upper outer quadrant and right axillary lymph node biopsy 01/03/2014, both positive for a clinical T2 N1, stage IIB invasive ductal carcinoma, grade  2, estrogen receptor strongly positive, progesterone receptor 1% positive, with HER-2 amplification by immunohistochemistry  (1) treated neoadjuvantly with carboplatin, docetaxel, trastuzumab and pertuzumab for 6 cycles completed 05/20/2014  (2) status post right mastectomy and sentinel lymph node sampling 06/22/2014 showing a complete pathologic response, with no residual disease in the breast and all 10 lymph nodes including all 3 sentinel lymph nodes clear  (3) continuing trastuzumab every 21 days through mid February 20 16th to complete one year; most recent echocardiogram 06/01/2014 showed a well preserved ejection fraction  (4) starting anastrozole 07/23/2014, to be continued for 5 years; if well tolerated will obtain DEXA scan  PLAN:  I spent approximately one hour today with Pattijo to review her case from diagnosis to definitive surgery and gave her that information in writing. Sharica tolerated her chemotherapy well and has done well with her surgery. Likely she will have her drains removed in the next week or so. Dr. Donne Hazel is setting up an appointment with Dr. Pablo Ledger regarding whether she feels adjuvant radiation would be helpful to this patient. If not, then we can start antiestrogen therapy.  Today we discussed in detail the difference between tamoxifen and the aromatase  inhibitors. Vanesha has a good understanding of the possible toxicities, side effects and complications of both agents. After much discussion we decided we would start with anastrozole. If she tolerates it well at the next visit I will set her up for a baseline bone density scan.  The patient understands her prognosis is very good since for a triple-negative patient's chemotherapy generally reduces risk by one third, anti-HER-2 treatment by an additional half, and anti-estrogen treatment by an additional half. Accordingly the residual risk would be  One sixth of the baseline risk. In addition, she had a complete pathologic response to neoadjuvant chemotherapy, which is also associated with an excellent long-term response.Alden Benjamin has a good understanding of the overall plan. She agrees with it. She knows the goal of treatment in her case is cure. She will call with any problems that may develop before her next visit here.    07/01/2014 9:18 AM  Chauncey Cruel, MD

## 2014-07-02 ENCOUNTER — Other Ambulatory Visit: Payer: Self-pay | Admitting: Oncology

## 2014-07-02 DIAGNOSIS — G4700 Insomnia, unspecified: Secondary | ICD-10-CM

## 2014-07-02 DIAGNOSIS — C50419 Malignant neoplasm of upper-outer quadrant of unspecified female breast: Secondary | ICD-10-CM

## 2014-07-04 NOTE — Telephone Encounter (Signed)
Called [employer] [phone] and left message with refill information as called in on 07-01-2014.  Left [facility] call back number for [employer] to call if any questions.

## 2014-07-05 ENCOUNTER — Encounter (INDEPENDENT_AMBULATORY_CARE_PROVIDER_SITE_OTHER): Payer: Self-pay | Admitting: General Surgery

## 2014-07-05 ENCOUNTER — Ambulatory Visit (INDEPENDENT_AMBULATORY_CARE_PROVIDER_SITE_OTHER): Payer: BC Managed Care – PPO | Admitting: General Surgery

## 2014-07-05 VITALS — BP 128/78 | HR 90 | Temp 97.5°F | Wt 187.0 lb

## 2014-07-05 DIAGNOSIS — Z09 Encounter for follow-up examination after completed treatment for conditions other than malignant neoplasm: Secondary | ICD-10-CM

## 2014-07-05 NOTE — Progress Notes (Addendum)
Subjective:     Patient ID: Madeline Davidson, female   DOB: 05-May-1956, 57 y.o.   MRN: [id number]  HPI 3 yof who underwent primary chemotherapy for right breast cancer with positive intramammary node at beginning. Axillary nodes appeared negative. She had very good response on mr after therapy. She had a difficult time with chemotherapy. We discussed at multidisciplinary conference and decided to recommend mastectomy and thought that sentinel node would be reasonable also. She underwent mastectomy with snbx. Her final path shows no residual tumor. There is a 3 cm stellate area with treatment change. Three sentinel nodes are negative for tumor. There were 7 nodes associated with her breast that are negative. She did not have ax dissection technically although she had 10 nodes removed.Her thrush is better. The axillary drain is putting out minimal fluid and the other drain is putting out about 30 per day still. She has seen Dr Jana Hakim who recommended antiestrogen therapy.  I have discussed with radiation and think that she will not need radiotherapy.  Review of Systems     Objective:   Physical Exam Incision clean without infection, superior lateral portion is swollen with some fluid present, drains with serous fluid    Assessment:     Primary chemo now s/p right mastectomy/snbx     Plan:     She will continue exercises.  I removed one drain. I will see her next week but she will call if drain is less than 30 cc two days in a row.  Will get final opinion from rad onc. She is to be presented at conference 7/22. I will also extend her time out of work and she may return on 8/3.

## 2014-07-06 ENCOUNTER — Telehealth: Payer: Self-pay | Admitting: Oncology

## 2014-07-06 ENCOUNTER — Telehealth (INDEPENDENT_AMBULATORY_CARE_PROVIDER_SITE_OTHER): Payer: Self-pay

## 2014-07-06 NOTE — Telephone Encounter (Signed)
Called pt to give her the message below from Dr Donne Hazel about Dr Pablo Ledger will prob. Not recommend xrt and pt will be presented next Wednesday at [facility]. Pt understands and will still call me tomorrow to touch base with me about the drain amount.

## 2014-07-06 NOTE — Telephone Encounter (Signed)
Message copied by Illene Regulus on Wed Jul 06, 2014  9:44 AM ------      Message from: [facility], [place]      Created: Tue Jul 05, 2014  9:20 PM       Would you call and tell her I do not think Pablo Ledger will recommend xrt but she wont be presented at conference until next wednesday ------

## 2014-07-06 NOTE — Telephone Encounter (Signed)
Pt gave me [employer] disability paperwork to complete since Dr Donne Hazel is giving her an extension on the RTW date from 7/22 to going back to work on 8/3. Dr Donne Hazel signed the form yesterday for me to complete the rest of the information today. Giving to med rec's for them to scan into system and fax back to [place] along with the office note from 7/14.

## 2014-07-06 NOTE — Telephone Encounter (Signed)
pt called to get sched...i gv pt appt over phone and she will pick up new sched at nxt visit....gv echo order to Mrs. Vaughan Basta will call pt with appt once authorized

## 2014-07-06 NOTE — Telephone Encounter (Signed)
Message copied by Illene Regulus on Wed Jul 06, 2014  4:26 PM ------      Message from: Donne Hazel, MATTHEW      Created: Wed Jul 06, 2014  3:20 PM       Could you call her and tell her that she will not need radiation ------

## 2014-07-06 NOTE — Telephone Encounter (Signed)
Pt advised she will not need radiation per Dr Donne Hazel.

## 2014-07-07 ENCOUNTER — Other Ambulatory Visit: Payer: Self-pay | Admitting: Oncology

## 2014-07-08 ENCOUNTER — Telehealth (INDEPENDENT_AMBULATORY_CARE_PROVIDER_SITE_OTHER): Payer: Self-pay

## 2014-07-08 NOTE — Telephone Encounter (Signed)
Returned call to pt about her drainage amount with the masty drain. The drainage is still putting out 30cc two days a go and 40cc yesterday. I advised pt this is still too high to remove the drain and pt understands. Pt will call me Monday to just check in with me about the drainage amount. Pt has an appt already scheduled for Thursday 7/23 with Dr Donne Hazel.

## 2014-07-14 ENCOUNTER — Encounter (INDEPENDENT_AMBULATORY_CARE_PROVIDER_SITE_OTHER): Payer: Self-pay | Admitting: General Surgery

## 2014-07-14 ENCOUNTER — Ambulatory Visit (INDEPENDENT_AMBULATORY_CARE_PROVIDER_SITE_OTHER): Payer: BC Managed Care – PPO | Admitting: General Surgery

## 2014-07-14 ENCOUNTER — Other Ambulatory Visit: Payer: Self-pay | Admitting: Hematology and Oncology

## 2014-07-14 VITALS — BP 134/84 | HR 68 | Temp 97.2°F | Resp 14 | Ht 64.0 in | Wt 187.4 lb

## 2014-07-14 DIAGNOSIS — Z09 Encounter for follow-up examination after completed treatment for conditions other than malignant neoplasm: Secondary | ICD-10-CM

## 2014-07-14 DIAGNOSIS — C50919 Malignant neoplasm of unspecified site of unspecified female breast: Secondary | ICD-10-CM

## 2014-07-14 NOTE — Progress Notes (Signed)
Subjective:     Patient ID: Madeline Davidson, female   DOB: 06/26/1956, 58 y.o.   MRN: [id number]  HPI  24 yof who underwent primary chemotherapy for right breast cancer with positive intramammary node at beginning. Axillary nodes appeared negative. She had very good response on mr after therapy. She had a difficult time with chemotherapy. We discussed at multidisciplinary conference and decided to recommend mastectomy and thought that sentinel node would be reasonable also. She underwent mastectomy with snbx. Her final path shows no residual tumor. She comes today doing well without complaint. Her last drain is not putting much out this time.   Review of Systems     Objective:   Physical Exam Healing incision without infection, drain removed today, there is swelling and hematoma superiorly that should resolve    Assessment:     S/p mastectomy     Plan:     She is doing well overall. I removed her drain.  Gave her exercises, will see back in two more weeks to make sure she continues to do well before I release her to full activity. Have discussed at multidisciplinary conference and do not think she needs radiotherapy. Dr Jana Hakim will also plan follow up for the porta nodes.

## 2014-07-15 ENCOUNTER — Encounter: Payer: Self-pay | Admitting: Oncology

## 2014-07-15 NOTE — Progress Notes (Signed)
Faxed clinical information to [employer] @ [phone] for patient's disability

## 2014-07-21 ENCOUNTER — Other Ambulatory Visit: Payer: Self-pay | Admitting: *Deleted

## 2014-07-21 DIAGNOSIS — C50411 Malignant neoplasm of upper-outer quadrant of right female breast: Secondary | ICD-10-CM

## 2014-07-22 ENCOUNTER — Other Ambulatory Visit (HOSPITAL_BASED_OUTPATIENT_CLINIC_OR_DEPARTMENT_OTHER): Payer: BC Managed Care – PPO

## 2014-07-22 ENCOUNTER — Ambulatory Visit (HOSPITAL_BASED_OUTPATIENT_CLINIC_OR_DEPARTMENT_OTHER): Payer: BC Managed Care – PPO

## 2014-07-22 VITALS — BP 143/67 | HR 85 | Temp 98.7°F

## 2014-07-22 DIAGNOSIS — C50411 Malignant neoplasm of upper-outer quadrant of right female breast: Secondary | ICD-10-CM

## 2014-07-22 DIAGNOSIS — C50419 Malignant neoplasm of upper-outer quadrant of unspecified female breast: Secondary | ICD-10-CM

## 2014-07-22 DIAGNOSIS — C773 Secondary and unspecified malignant neoplasm of axilla and upper limb lymph nodes: Secondary | ICD-10-CM

## 2014-07-22 DIAGNOSIS — Z5112 Encounter for antineoplastic immunotherapy: Secondary | ICD-10-CM

## 2014-07-22 LAB — COMPREHENSIVE METABOLIC PANEL (CC13)
ALBUMIN: 3.3 g/dL — AB (ref 3.5–5.0)
ALT: 10 U/L (ref 0–55)
AST: 13 U/L (ref 5–34)
Alkaline Phosphatase: 87 U/L (ref 40–150)
Anion Gap: 10 mEq/L (ref 3–11)
BILIRUBIN TOTAL: 0.26 mg/dL (ref 0.20–1.20)
BUN: 17.8 mg/dL (ref 7.0–26.0)
CO2: 26 mEq/L (ref 22–29)
CREATININE: 0.8 mg/dL (ref 0.6–1.1)
Calcium: 9.4 mg/dL (ref 8.4–10.4)
Chloride: 108 mEq/L (ref 98–109)
GLUCOSE: 109 mg/dL (ref 70–140)
POTASSIUM: 3.2 meq/L — AB (ref 3.5–5.1)
Sodium: 145 mEq/L (ref 136–145)
Total Protein: 6.4 g/dL (ref 6.4–8.3)

## 2014-07-22 LAB — CBC WITH DIFFERENTIAL/PLATELET
BASO%: 0.7 % (ref 0.0–2.0)
BASOS ABS: 0.1 10*3/uL (ref 0.0–0.1)
EOS ABS: 0.1 10*3/uL (ref 0.0–0.5)
EOS%: 1.6 % (ref 0.0–7.0)
HCT: 29.1 % — ABNORMAL LOW (ref 34.8–46.6)
HEMOGLOBIN: 9.5 g/dL — AB (ref 11.6–15.9)
LYMPH%: 16.7 % (ref 14.0–49.7)
MCH: 30.6 pg (ref 25.1–34.0)
MCHC: 32.5 g/dL (ref 31.5–36.0)
MCV: 94.1 fL (ref 79.5–101.0)
MONO#: 0.5 10*3/uL (ref 0.1–0.9)
MONO%: 6.4 % (ref 0.0–14.0)
NEUT%: 74.6 % (ref 38.4–76.8)
NEUTROS ABS: 5.3 10*3/uL (ref 1.5–6.5)
PLATELETS: 252 10*3/uL (ref 145–400)
RBC: 3.09 10*6/uL — ABNORMAL LOW (ref 3.70–5.45)
RDW: 15.9 % — ABNORMAL HIGH (ref 11.2–14.5)
WBC: 7.2 10*3/uL (ref 3.9–10.3)
lymph#: 1.2 10*3/uL (ref 0.9–3.3)

## 2014-07-22 MED ORDER — DIPHENHYDRAMINE HCL 25 MG PO CAPS
25.0000 mg | ORAL_CAPSULE | Freq: Once | ORAL | Status: AC
  Administered 2014-07-22: 25 mg via ORAL

## 2014-07-22 MED ORDER — TRASTUZUMAB CHEMO INJECTION 440 MG
6.0000 mg/kg | Freq: Once | INTRAVENOUS | Status: AC
  Administered 2014-07-22: 525 mg via INTRAVENOUS
  Filled 2014-07-22: qty 25

## 2014-07-22 MED ORDER — ACETAMINOPHEN 325 MG PO TABS
650.0000 mg | ORAL_TABLET | Freq: Once | ORAL | Status: AC
  Administered 2014-07-22: 650 mg via ORAL

## 2014-07-22 MED ORDER — ACETAMINOPHEN 325 MG PO TABS
ORAL_TABLET | ORAL | Status: AC
Start: 2014-07-22 — End: 2014-07-22
  Filled 2014-07-22: qty 2

## 2014-07-22 MED ORDER — HEPARIN SOD (PORK) LOCK FLUSH 100 UNIT/ML IV SOLN
500.0000 [IU] | Freq: Once | INTRAVENOUS | Status: AC | PRN
Start: 2014-07-22 — End: 2014-07-22
  Administered 2014-07-22: 500 [IU]
  Filled 2014-07-22: qty 5

## 2014-07-22 MED ORDER — SODIUM CHLORIDE 0.9 % IV SOLN
Freq: Once | INTRAVENOUS | Status: AC
  Administered 2014-07-22: 15:00:00 via INTRAVENOUS

## 2014-07-22 MED ORDER — SODIUM CHLORIDE 0.9 % IJ SOLN
10.0000 mL | INTRAMUSCULAR | Status: DC | PRN
  Administered 2014-07-22: 10 mL
  Filled 2014-07-22: qty 10

## 2014-07-22 MED ORDER — DIPHENHYDRAMINE HCL 25 MG PO CAPS
ORAL_CAPSULE | ORAL | Status: AC
  Filled 2014-07-22: qty 1

## 2014-07-22 NOTE — Patient Instructions (Signed)
[facility] Discharge Instructions for Patients Receiving Chemotherapy  Today you received the following chemotherapy agents herceptin   To help prevent nausea and vomiting after your treatment, we encourage you to take your nausea medication as directed   If you develop nausea and vomiting that is not controlled by your nausea medication, call the clinic.   BELOW ARE SYMPTOMS THAT SHOULD BE REPORTED IMMEDIATELY:  *FEVER GREATER THAN 100.5 F  *CHILLS WITH OR WITHOUT FEVER  NAUSEA AND VOMITING THAT IS NOT CONTROLLED WITH YOUR NAUSEA MEDICATION  *UNUSUAL SHORTNESS OF BREATH  *UNUSUAL BRUISING OR BLEEDING  TENDERNESS IN MOUTH AND THROAT WITH OR WITHOUT PRESENCE OF ULCERS  *URINARY PROBLEMS  *BOWEL PROBLEMS  UNUSUAL RASH Items with * indicate a potential emergency and should be followed up as soon as possible.  Feel free to call the clinic you have any questions or concerns. The clinic phone number is [phone].  

## 2014-07-28 ENCOUNTER — Ambulatory Visit (INDEPENDENT_AMBULATORY_CARE_PROVIDER_SITE_OTHER): Payer: BC Managed Care – PPO | Admitting: General Surgery

## 2014-07-28 ENCOUNTER — Encounter (INDEPENDENT_AMBULATORY_CARE_PROVIDER_SITE_OTHER): Payer: Self-pay | Admitting: General Surgery

## 2014-07-28 VITALS — BP 126/74 | HR 47 | Temp 97.0°F | Ht 64.0 in | Wt 190.0 lb

## 2014-07-28 DIAGNOSIS — Z09 Encounter for follow-up examination after completed treatment for conditions other than malignant neoplasm: Secondary | ICD-10-CM

## 2014-07-28 NOTE — Progress Notes (Signed)
Subjective:     Patient ID: Madeline Davidson, female   DOB: 04/30/1956, 58 y.o.   MRN: [id number]  HPI 75 yof who underwent primary chemotherapy for right breast cancer with positive intramammary node at beginning. Axillary nodes appeared negative. She had very good response on mr after therapy. She had a difficult time with chemotherapy.  She underwent mastectomy with snbx. Her final path shows no residual tumor. She comes today doing well without complaint. She has no more drains. She still has some swelling at lateral aspect of incision. She is back to work.  No real complaints.  Will see med onc, decision made for no radiotherapy.   Review of Systems     Objective:   Physical Exam Healing incision without infection, there is swelling and hematoma superiorly that is mildly improved     Assessment:     S/p mastectomy    Plan:     She is doing well overall. I gave her rx for postmastectomy supplies.  I also gave her info and encouraged her to attend [facility] PT class.  I told her that if area persists laterally and is bothering her we can excise but will wait four months.  I think this is from where the binder cut into her and is an organized hematoma. Dr Jana Hakim will also plan follow up for the porta nodes.

## 2014-08-12 ENCOUNTER — Other Ambulatory Visit (HOSPITAL_BASED_OUTPATIENT_CLINIC_OR_DEPARTMENT_OTHER): Payer: BC Managed Care – PPO

## 2014-08-12 ENCOUNTER — Ambulatory Visit (HOSPITAL_BASED_OUTPATIENT_CLINIC_OR_DEPARTMENT_OTHER): Payer: BC Managed Care – PPO

## 2014-08-12 ENCOUNTER — Other Ambulatory Visit: Payer: Self-pay | Admitting: *Deleted

## 2014-08-12 VITALS — BP 133/73 | HR 82 | Temp 97.9°F | Resp 18

## 2014-08-12 DIAGNOSIS — C50411 Malignant neoplasm of upper-outer quadrant of right female breast: Secondary | ICD-10-CM

## 2014-08-12 DIAGNOSIS — C773 Secondary and unspecified malignant neoplasm of axilla and upper limb lymph nodes: Secondary | ICD-10-CM

## 2014-08-12 DIAGNOSIS — Z5112 Encounter for antineoplastic immunotherapy: Secondary | ICD-10-CM

## 2014-08-12 DIAGNOSIS — C50519 Malignant neoplasm of lower-outer quadrant of unspecified female breast: Secondary | ICD-10-CM

## 2014-08-12 LAB — COMPREHENSIVE METABOLIC PANEL (CC13)
ALBUMIN: 3.5 g/dL (ref 3.5–5.0)
ALT: 10 U/L (ref 0–55)
AST: 16 U/L (ref 5–34)
Alkaline Phosphatase: 92 U/L (ref 40–150)
Anion Gap: 8 mEq/L (ref 3–11)
BUN: 17 mg/dL (ref 7.0–26.0)
CALCIUM: 9.6 mg/dL (ref 8.4–10.4)
CHLORIDE: 110 meq/L — AB (ref 98–109)
CO2: 26 mEq/L (ref 22–29)
CREATININE: 0.8 mg/dL (ref 0.6–1.1)
Glucose: 97 mg/dl (ref 70–140)
POTASSIUM: 4 meq/L (ref 3.5–5.1)
Sodium: 144 mEq/L (ref 136–145)
Total Bilirubin: <0.2 mg/dL (ref 0.20–1.20)
Total Protein: 6.6 g/dL (ref 6.4–8.3)

## 2014-08-12 LAB — CBC WITH DIFFERENTIAL/PLATELET
BASO%: 0.8 % (ref 0.0–2.0)
Basophils Absolute: 0.1 10*3/uL (ref 0.0–0.1)
EOS%: 1.7 % (ref 0.0–7.0)
Eosinophils Absolute: 0.1 10*3/uL (ref 0.0–0.5)
HCT: 31.1 % — ABNORMAL LOW (ref 34.8–46.6)
HGB: 10 g/dL — ABNORMAL LOW (ref 11.6–15.9)
LYMPH#: 1.3 10*3/uL (ref 0.9–3.3)
LYMPH%: 17.4 % (ref 14.0–49.7)
MCH: 29.3 pg (ref 25.1–34.0)
MCHC: 32.3 g/dL (ref 31.5–36.0)
MCV: 91 fL (ref 79.5–101.0)
MONO#: 0.5 10*3/uL (ref 0.1–0.9)
MONO%: 6.1 % (ref 0.0–14.0)
NEUT#: 5.7 10*3/uL (ref 1.5–6.5)
NEUT%: 74 % (ref 38.4–76.8)
Platelets: 262 10*3/uL (ref 145–400)
RBC: 3.42 10*6/uL — ABNORMAL LOW (ref 3.70–5.45)
RDW: 14.8 % — ABNORMAL HIGH (ref 11.2–14.5)
WBC: 7.7 10*3/uL (ref 3.9–10.3)

## 2014-08-12 MED ORDER — TRASTUZUMAB CHEMO INJECTION 440 MG
6.0000 mg/kg | Freq: Once | INTRAVENOUS | Status: AC
  Administered 2014-08-12: 525 mg via INTRAVENOUS
  Filled 2014-08-12: qty 25

## 2014-08-12 MED ORDER — HEPARIN SOD (PORK) LOCK FLUSH 100 UNIT/ML IV SOLN
500.0000 [IU] | Freq: Once | INTRAVENOUS | Status: AC | PRN
  Administered 2014-08-12: 500 [IU]
  Filled 2014-08-12: qty 5

## 2014-08-12 MED ORDER — SODIUM CHLORIDE 0.9 % IJ SOLN
10.0000 mL | INTRAMUSCULAR | Status: DC | PRN
  Administered 2014-08-12: 10 mL
  Filled 2014-08-12: qty 10

## 2014-08-12 MED ORDER — DIPHENHYDRAMINE HCL 25 MG PO CAPS
ORAL_CAPSULE | ORAL | Status: AC
  Filled 2014-08-12: qty 1

## 2014-08-12 MED ORDER — DIPHENHYDRAMINE HCL 25 MG PO CAPS
25.0000 mg | ORAL_CAPSULE | Freq: Once | ORAL | Status: AC
  Administered 2014-08-12: 25 mg via ORAL

## 2014-08-12 MED ORDER — SODIUM CHLORIDE 0.9 % IV SOLN
Freq: Once | INTRAVENOUS | Status: AC
  Administered 2014-08-12: 16:00:00 via INTRAVENOUS

## 2014-08-12 MED ORDER — ACETAMINOPHEN 325 MG PO TABS
ORAL_TABLET | ORAL | Status: AC
  Filled 2014-08-12: qty 2

## 2014-08-12 MED ORDER — ACETAMINOPHEN 325 MG PO TABS
650.0000 mg | ORAL_TABLET | Freq: Once | ORAL | Status: AC
  Administered 2014-08-12: 650 mg via ORAL

## 2014-08-12 NOTE — Progress Notes (Signed)
Discharged at 1655, ambulatory with her mom.  No distress noted.

## 2014-08-12 NOTE — Patient Instructions (Signed)

## 2014-08-17 ENCOUNTER — Telehealth: Payer: Self-pay | Admitting: Oncology

## 2014-08-17 NOTE — Telephone Encounter (Signed)
s.w. pt mother and advised on echo....ok and aware

## 2014-08-23 ENCOUNTER — Other Ambulatory Visit: Payer: Self-pay | Admitting: *Deleted

## 2014-08-23 DIAGNOSIS — G4700 Insomnia, unspecified: Secondary | ICD-10-CM

## 2014-08-23 DIAGNOSIS — C50419 Malignant neoplasm of upper-outer quadrant of unspecified female breast: Secondary | ICD-10-CM

## 2014-08-23 MED ORDER — ZOLPIDEM TARTRATE ER 12.5 MG PO TBCR: 12.5000 mg | EXTENDED_RELEASE_TABLET | Freq: Every evening | ORAL | Status: DC | PRN

## 2014-08-25 ENCOUNTER — Encounter (HOSPITAL_COMMUNITY): Payer: Self-pay | Admitting: Vascular Surgery

## 2014-08-25 ENCOUNTER — Other Ambulatory Visit: Payer: Self-pay | Admitting: *Deleted

## 2014-09-02 ENCOUNTER — Other Ambulatory Visit: Payer: Self-pay | Admitting: *Deleted

## 2014-09-02 ENCOUNTER — Other Ambulatory Visit (HOSPITAL_BASED_OUTPATIENT_CLINIC_OR_DEPARTMENT_OTHER): Payer: BC Managed Care – PPO

## 2014-09-02 ENCOUNTER — Ambulatory Visit (HOSPITAL_COMMUNITY): Payer: BC Managed Care – PPO

## 2014-09-02 ENCOUNTER — Ambulatory Visit (HOSPITAL_BASED_OUTPATIENT_CLINIC_OR_DEPARTMENT_OTHER): Payer: BC Managed Care – PPO

## 2014-09-02 VITALS — BP 116/68 | HR 86 | Temp 98.4°F | Resp 18

## 2014-09-02 DIAGNOSIS — Z5112 Encounter for antineoplastic immunotherapy: Secondary | ICD-10-CM

## 2014-09-02 DIAGNOSIS — C50419 Malignant neoplasm of upper-outer quadrant of unspecified female breast: Secondary | ICD-10-CM

## 2014-09-02 DIAGNOSIS — C50411 Malignant neoplasm of upper-outer quadrant of right female breast: Secondary | ICD-10-CM

## 2014-09-02 DIAGNOSIS — C773 Secondary and unspecified malignant neoplasm of axilla and upper limb lymph nodes: Secondary | ICD-10-CM

## 2014-09-02 LAB — COMPREHENSIVE METABOLIC PANEL (CC13)
ALK PHOS: 105 U/L (ref 40–150)
ALT: 10 U/L (ref 0–55)
AST: 15 U/L (ref 5–34)
Albumin: 3.5 g/dL (ref 3.5–5.0)
Anion Gap: 11 mEq/L (ref 3–11)
BILIRUBIN TOTAL: 0.22 mg/dL (ref 0.20–1.20)
BUN: 19.3 mg/dL (ref 7.0–26.0)
CO2: 26 mEq/L (ref 22–29)
Calcium: 9.6 mg/dL (ref 8.4–10.4)
Chloride: 109 mEq/L (ref 98–109)
Creatinine: 0.9 mg/dL (ref 0.6–1.1)
Glucose: 123 mg/dl (ref 70–140)
POTASSIUM: 3.8 meq/L (ref 3.5–5.1)
SODIUM: 146 meq/L — AB (ref 136–145)
Total Protein: 6.8 g/dL (ref 6.4–8.3)

## 2014-09-02 LAB — CBC WITH DIFFERENTIAL/PLATELET
BASO%: 0.5 % (ref 0.0–2.0)
BASOS ABS: 0 10*3/uL (ref 0.0–0.1)
EOS ABS: 0.1 10*3/uL (ref 0.0–0.5)
EOS%: 1.6 % (ref 0.0–7.0)
HCT: 32.9 % — ABNORMAL LOW (ref 34.8–46.6)
HGB: 10.6 g/dL — ABNORMAL LOW (ref 11.6–15.9)
LYMPH%: 18.3 % (ref 14.0–49.7)
MCH: 28.7 pg (ref 25.1–34.0)
MCHC: 32.4 g/dL (ref 31.5–36.0)
MCV: 88.8 fL (ref 79.5–101.0)
MONO#: 0.4 10*3/uL (ref 0.1–0.9)
MONO%: 5.2 % (ref 0.0–14.0)
NEUT%: 74.4 % (ref 38.4–76.8)
NEUTROS ABS: 5.6 10*3/uL (ref 1.5–6.5)
PLATELETS: 259 10*3/uL (ref 145–400)
RBC: 3.7 10*6/uL (ref 3.70–5.45)
RDW: 14.7 % — ABNORMAL HIGH (ref 11.2–14.5)
WBC: 7.5 10*3/uL (ref 3.9–10.3)
lymph#: 1.4 10*3/uL (ref 0.9–3.3)

## 2014-09-02 MED ORDER — ACETAMINOPHEN 325 MG PO TABS
650.0000 mg | ORAL_TABLET | Freq: Once | ORAL | Status: AC
  Administered 2014-09-02: 650 mg via ORAL

## 2014-09-02 MED ORDER — DIPHENHYDRAMINE HCL 25 MG PO CAPS
25.0000 mg | ORAL_CAPSULE | Freq: Once | ORAL | Status: AC
  Administered 2014-09-02: 25 mg via ORAL

## 2014-09-02 MED ORDER — SODIUM CHLORIDE 0.9 % IJ SOLN
10.0000 mL | INTRAMUSCULAR | Status: DC | PRN
  Administered 2014-09-02: 10 mL
  Filled 2014-09-02: qty 10

## 2014-09-02 MED ORDER — HEPARIN SOD (PORK) LOCK FLUSH 100 UNIT/ML IV SOLN
500.0000 [IU] | Freq: Once | INTRAVENOUS | Status: AC | PRN
  Administered 2014-09-02: 500 [IU]
  Filled 2014-09-02: qty 5

## 2014-09-02 MED ORDER — ACETAMINOPHEN 325 MG PO TABS
ORAL_TABLET | ORAL | Status: AC
  Filled 2014-09-02: qty 2

## 2014-09-02 MED ORDER — DIPHENHYDRAMINE HCL 25 MG PO CAPS
ORAL_CAPSULE | ORAL | Status: AC
  Filled 2014-09-02: qty 1

## 2014-09-02 MED ORDER — TRASTUZUMAB CHEMO INJECTION 440 MG
6.0000 mg/kg | Freq: Once | INTRAVENOUS | Status: AC
  Administered 2014-09-02: 525 mg via INTRAVENOUS
  Filled 2014-09-02: qty 25

## 2014-09-02 MED ORDER — SODIUM CHLORIDE 0.9 % IV SOLN
Freq: Once | INTRAVENOUS | Status: AC
  Administered 2014-09-02: 16:00:00 via INTRAVENOUS

## 2014-09-02 NOTE — Patient Instructions (Signed)
[facility] Discharge Instructions for Patients Receiving Chemotherapy  Today you received the following chemotherapy agents Herceptin.  To help prevent nausea and vomiting after your treatment, we encourage you to take your nausea medication as directed.   If you develop nausea and vomiting that is not controlled by your nausea medication, call the clinic.   BELOW ARE SYMPTOMS THAT SHOULD BE REPORTED IMMEDIATELY:  *FEVER GREATER THAN 100.5 F  *CHILLS WITH OR WITHOUT FEVER  NAUSEA AND VOMITING THAT IS NOT CONTROLLED WITH YOUR NAUSEA MEDICATION  *UNUSUAL SHORTNESS OF BREATH  *UNUSUAL BRUISING OR BLEEDING  TENDERNESS IN MOUTH AND THROAT WITH OR WITHOUT PRESENCE OF ULCERS  *URINARY PROBLEMS  *BOWEL PROBLEMS  UNUSUAL RASH Items with * indicate a potential emergency and should be followed up as soon as possible.  Feel free to call the clinic you have any questions or concerns. The clinic phone number is [phone].  

## 2014-09-22 ENCOUNTER — Ambulatory Visit (HOSPITAL_COMMUNITY): Payer: BC Managed Care – PPO

## 2014-09-22 ENCOUNTER — Other Ambulatory Visit: Payer: Self-pay | Admitting: *Deleted

## 2014-09-22 MED ORDER — ANASTROZOLE 1 MG PO TABS: 1.0000 mg | ORAL_TABLET | Freq: Every day | ORAL | Status: DC

## 2014-09-23 ENCOUNTER — Other Ambulatory Visit: Payer: Self-pay | Admitting: Adult Health

## 2014-09-23 ENCOUNTER — Other Ambulatory Visit (HOSPITAL_BASED_OUTPATIENT_CLINIC_OR_DEPARTMENT_OTHER): Payer: BC Managed Care – PPO

## 2014-09-23 ENCOUNTER — Ambulatory Visit (HOSPITAL_BASED_OUTPATIENT_CLINIC_OR_DEPARTMENT_OTHER): Payer: BC Managed Care – PPO

## 2014-09-23 VITALS — BP 121/67 | HR 79 | Temp 98.6°F | Resp 18 | Wt 190.2 lb

## 2014-09-23 DIAGNOSIS — Z5112 Encounter for antineoplastic immunotherapy: Secondary | ICD-10-CM

## 2014-09-23 DIAGNOSIS — C50411 Malignant neoplasm of upper-outer quadrant of right female breast: Secondary | ICD-10-CM

## 2014-09-23 DIAGNOSIS — C773 Secondary and unspecified malignant neoplasm of axilla and upper limb lymph nodes: Secondary | ICD-10-CM

## 2014-09-23 LAB — COMPREHENSIVE METABOLIC PANEL (CC13)
ALK PHOS: 99 U/L (ref 40–150)
ALT: 13 U/L (ref 0–55)
AST: 11 U/L (ref 5–34)
Albumin: 3.2 g/dL — ABNORMAL LOW (ref 3.5–5.0)
Anion Gap: 9 mEq/L (ref 3–11)
BILIRUBIN TOTAL: 0.2 mg/dL (ref 0.20–1.20)
BUN: 18.7 mg/dL (ref 7.0–26.0)
CO2: 26 meq/L (ref 22–29)
CREATININE: 0.8 mg/dL (ref 0.6–1.1)
Calcium: 9.6 mg/dL (ref 8.4–10.4)
Chloride: 111 mEq/L — ABNORMAL HIGH (ref 98–109)
GLUCOSE: 95 mg/dL (ref 70–140)
Potassium: 3.6 mEq/L (ref 3.5–5.1)
SODIUM: 146 meq/L — AB (ref 136–145)
Total Protein: 6.4 g/dL (ref 6.4–8.3)

## 2014-09-23 LAB — CBC WITH DIFFERENTIAL/PLATELET
BASO%: 0.4 % (ref 0.0–2.0)
BASOS ABS: 0 10*3/uL (ref 0.0–0.1)
EOS ABS: 0.1 10*3/uL (ref 0.0–0.5)
EOS%: 2.1 % (ref 0.0–7.0)
HEMATOCRIT: 33.4 % — AB (ref 34.8–46.6)
HGB: 10.6 g/dL — ABNORMAL LOW (ref 11.6–15.9)
LYMPH%: 20 % (ref 14.0–49.7)
MCH: 27.9 pg (ref 25.1–34.0)
MCHC: 31.7 g/dL (ref 31.5–36.0)
MCV: 88.1 fL (ref 79.5–101.0)
MONO#: 0.4 10*3/uL (ref 0.1–0.9)
MONO%: 6.2 % (ref 0.0–14.0)
NEUT%: 71.3 % (ref 38.4–76.8)
NEUTROS ABS: 4.8 10*3/uL (ref 1.5–6.5)
PLATELETS: 241 10*3/uL (ref 145–400)
RBC: 3.8 10*6/uL (ref 3.70–5.45)
RDW: 14.6 % — ABNORMAL HIGH (ref 11.2–14.5)
WBC: 6.8 10*3/uL (ref 3.9–10.3)
lymph#: 1.4 10*3/uL (ref 0.9–3.3)

## 2014-09-23 MED ORDER — SODIUM CHLORIDE 0.9 % IJ SOLN
10.0000 mL | INTRAMUSCULAR | Status: DC | PRN
  Administered 2014-09-23: 10 mL
  Filled 2014-09-23: qty 10

## 2014-09-23 MED ORDER — TRASTUZUMAB CHEMO INJECTION 440 MG
6.0000 mg/kg | Freq: Once | INTRAVENOUS | Status: AC
  Administered 2014-09-23: 525 mg via INTRAVENOUS
  Filled 2014-09-23: qty 25

## 2014-09-23 MED ORDER — ACETAMINOPHEN 325 MG PO TABS
ORAL_TABLET | ORAL | Status: AC
  Filled 2014-09-23: qty 2

## 2014-09-23 MED ORDER — DIPHENHYDRAMINE HCL 25 MG PO CAPS
25.0000 mg | ORAL_CAPSULE | Freq: Once | ORAL | Status: AC
  Administered 2014-09-23: 25 mg via ORAL

## 2014-09-23 MED ORDER — SODIUM CHLORIDE 0.9 % IV SOLN
Freq: Once | INTRAVENOUS | Status: AC
  Administered 2014-09-23: 15:00:00 via INTRAVENOUS

## 2014-09-23 MED ORDER — ACETAMINOPHEN 325 MG PO TABS
650.0000 mg | ORAL_TABLET | Freq: Once | ORAL | Status: AC
  Administered 2014-09-23: 650 mg via ORAL

## 2014-09-23 MED ORDER — HEPARIN SOD (PORK) LOCK FLUSH 100 UNIT/ML IV SOLN
500.0000 [IU] | Freq: Once | INTRAVENOUS | Status: AC | PRN
  Administered 2014-09-23: 500 [IU]
  Filled 2014-09-23: qty 5

## 2014-09-23 MED ORDER — DIPHENHYDRAMINE HCL 25 MG PO CAPS
ORAL_CAPSULE | ORAL | Status: AC
  Filled 2014-09-23: qty 1

## 2014-09-23 NOTE — Progress Notes (Signed)
Per Mendel Ryder, NP okay to treat today without lab results back on 09/23/14.

## 2014-09-23 NOTE — Patient Instructions (Signed)

## 2014-10-13 ENCOUNTER — Other Ambulatory Visit: Payer: Self-pay | Admitting: *Deleted

## 2014-10-13 DIAGNOSIS — C50411 Malignant neoplasm of upper-outer quadrant of right female breast: Secondary | ICD-10-CM

## 2014-10-14 ENCOUNTER — Other Ambulatory Visit (HOSPITAL_BASED_OUTPATIENT_CLINIC_OR_DEPARTMENT_OTHER): Payer: BC Managed Care – PPO

## 2014-10-14 ENCOUNTER — Ambulatory Visit (HOSPITAL_BASED_OUTPATIENT_CLINIC_OR_DEPARTMENT_OTHER): Payer: BC Managed Care – PPO

## 2014-10-14 ENCOUNTER — Ambulatory Visit (HOSPITAL_COMMUNITY)
Admission: RE | Admit: 2014-10-14 | Discharge: 2014-10-14 | Disposition: A | Discharge status: Home / Self Care | Payer: BC Managed Care – PPO | Source: Ambulatory Visit | Attending: Oncology | Admitting: Oncology

## 2014-10-14 ENCOUNTER — Ambulatory Visit (HOSPITAL_COMMUNITY): Payer: BC Managed Care – PPO

## 2014-10-14 VITALS — BP 123/68 | HR 88 | Temp 99.3°F | Resp 16

## 2014-10-14 DIAGNOSIS — C50411 Malignant neoplasm of upper-outer quadrant of right female breast: Secondary | ICD-10-CM

## 2014-10-14 DIAGNOSIS — I10 Essential (primary) hypertension: Secondary | ICD-10-CM | POA: Insufficient documentation

## 2014-10-14 DIAGNOSIS — C50511 Malignant neoplasm of lower-outer quadrant of right female breast: Secondary | ICD-10-CM

## 2014-10-14 DIAGNOSIS — I369 Nonrheumatic tricuspid valve disorder, unspecified: Secondary | ICD-10-CM

## 2014-10-14 DIAGNOSIS — C773 Secondary and unspecified malignant neoplasm of axilla and upper limb lymph nodes: Secondary | ICD-10-CM

## 2014-10-14 DIAGNOSIS — Z5112 Encounter for antineoplastic immunotherapy: Secondary | ICD-10-CM

## 2014-10-14 LAB — CBC WITH DIFFERENTIAL/PLATELET
BASO%: 0.3 % (ref 0.0–2.0)
BASOS ABS: 0 10*3/uL (ref 0.0–0.1)
EOS%: 1.5 % (ref 0.0–7.0)
Eosinophils Absolute: 0.1 10*3/uL (ref 0.0–0.5)
HCT: 34.9 % (ref 34.8–46.6)
HGB: 11.2 g/dL — ABNORMAL LOW (ref 11.6–15.9)
LYMPH%: 15.4 % (ref 14.0–49.7)
MCH: 27.8 pg (ref 25.1–34.0)
MCHC: 32 g/dL (ref 31.5–36.0)
MCV: 86.7 fL (ref 79.5–101.0)
MONO#: 0.4 10*3/uL (ref 0.1–0.9)
MONO%: 4.3 % (ref 0.0–14.0)
NEUT#: 6.6 10*3/uL — ABNORMAL HIGH (ref 1.5–6.5)
NEUT%: 78.5 % — ABNORMAL HIGH (ref 38.4–76.8)
Platelets: 256 10*3/uL (ref 145–400)
RBC: 4.02 10*6/uL (ref 3.70–5.45)
RDW: 14.9 % — AB (ref 11.2–14.5)
WBC: 8.4 10*3/uL (ref 3.9–10.3)
lymph#: 1.3 10*3/uL (ref 0.9–3.3)

## 2014-10-14 LAB — COMPREHENSIVE METABOLIC PANEL (CC13)
ALBUMIN: 3.6 g/dL (ref 3.5–5.0)
ALT: 18 U/L (ref 0–55)
AST: 17 U/L (ref 5–34)
Alkaline Phosphatase: 119 U/L (ref 40–150)
Anion Gap: 11 mEq/L (ref 3–11)
BUN: 22.7 mg/dL (ref 7.0–26.0)
CO2: 24 mEq/L (ref 22–29)
Calcium: 9.9 mg/dL (ref 8.4–10.4)
Chloride: 110 mEq/L — ABNORMAL HIGH (ref 98–109)
Creatinine: 0.9 mg/dL (ref 0.6–1.1)
Glucose: 123 mg/dl (ref 70–140)
POTASSIUM: 3.9 meq/L (ref 3.5–5.1)
SODIUM: 146 meq/L — AB (ref 136–145)
Total Bilirubin: 0.33 mg/dL (ref 0.20–1.20)
Total Protein: 6.7 g/dL (ref 6.4–8.3)

## 2014-10-14 MED ORDER — ACETAMINOPHEN 325 MG PO TABS
650.0000 mg | ORAL_TABLET | Freq: Once | ORAL | Status: AC
  Administered 2014-10-14: 650 mg via ORAL

## 2014-10-14 MED ORDER — DIPHENHYDRAMINE HCL 25 MG PO CAPS
ORAL_CAPSULE | ORAL | Status: AC
  Filled 2014-10-14: qty 1

## 2014-10-14 MED ORDER — HEPARIN SOD (PORK) LOCK FLUSH 100 UNIT/ML IV SOLN
500.0000 [IU] | Freq: Once | INTRAVENOUS | Status: AC | PRN
  Administered 2014-10-14: 500 [IU]
  Filled 2014-10-14: qty 5

## 2014-10-14 MED ORDER — ACETAMINOPHEN 325 MG PO TABS
ORAL_TABLET | ORAL | Status: AC
  Filled 2014-10-14: qty 2

## 2014-10-14 MED ORDER — SODIUM CHLORIDE 0.9 % IV SOLN
Freq: Once | INTRAVENOUS | Status: AC
  Administered 2014-10-14: 15:00:00 via INTRAVENOUS

## 2014-10-14 MED ORDER — SODIUM CHLORIDE 0.9 % IJ SOLN
10.0000 mL | INTRAMUSCULAR | Status: DC | PRN
  Administered 2014-10-14: 10 mL
  Filled 2014-10-14: qty 10

## 2014-10-14 MED ORDER — DIPHENHYDRAMINE HCL 25 MG PO CAPS
25.0000 mg | ORAL_CAPSULE | Freq: Once | ORAL | Status: AC
  Administered 2014-10-14: 25 mg via ORAL

## 2014-10-14 MED ORDER — TRASTUZUMAB CHEMO INJECTION 440 MG
6.0000 mg/kg | Freq: Once | INTRAVENOUS | Status: AC
  Administered 2014-10-14: 525 mg via INTRAVENOUS
  Filled 2014-10-14: qty 25

## 2014-10-14 NOTE — Patient Instructions (Signed)

## 2014-10-14 NOTE — Progress Notes (Signed)
  Echocardiogram 2D Echocardiogram has been performed.  Madeline Davidson M 10/14/2014, 11:45 AM

## 2014-10-24 ENCOUNTER — Encounter (INDEPENDENT_AMBULATORY_CARE_PROVIDER_SITE_OTHER): Payer: Self-pay | Admitting: General Surgery

## 2014-11-04 ENCOUNTER — Other Ambulatory Visit: Payer: Self-pay

## 2014-11-04 ENCOUNTER — Ambulatory Visit (HOSPITAL_BASED_OUTPATIENT_CLINIC_OR_DEPARTMENT_OTHER): Payer: BC Managed Care – PPO

## 2014-11-04 ENCOUNTER — Ambulatory Visit (HOSPITAL_BASED_OUTPATIENT_CLINIC_OR_DEPARTMENT_OTHER): Payer: BC Managed Care – PPO | Admitting: Hematology

## 2014-11-04 ENCOUNTER — Other Ambulatory Visit (HOSPITAL_BASED_OUTPATIENT_CLINIC_OR_DEPARTMENT_OTHER): Payer: BC Managed Care – PPO

## 2014-11-04 VITALS — BP 144/85 | HR 90 | Temp 98.7°F | Resp 18 | Ht 64.0 in | Wt 192.8 lb

## 2014-11-04 DIAGNOSIS — C773 Secondary and unspecified malignant neoplasm of axilla and upper limb lymph nodes: Secondary | ICD-10-CM

## 2014-11-04 DIAGNOSIS — C50411 Malignant neoplasm of upper-outer quadrant of right female breast: Secondary | ICD-10-CM

## 2014-11-04 DIAGNOSIS — Z170 Estrogen receptor positive status [ER+]: Secondary | ICD-10-CM

## 2014-11-04 DIAGNOSIS — G4700 Insomnia, unspecified: Secondary | ICD-10-CM

## 2014-11-04 DIAGNOSIS — Z5112 Encounter for antineoplastic immunotherapy: Secondary | ICD-10-CM

## 2014-11-04 DIAGNOSIS — C50419 Malignant neoplasm of upper-outer quadrant of unspecified female breast: Secondary | ICD-10-CM

## 2014-11-04 DIAGNOSIS — C50911 Malignant neoplasm of unspecified site of right female breast: Secondary | ICD-10-CM

## 2014-11-04 LAB — COMPREHENSIVE METABOLIC PANEL (CC13)
ALK PHOS: 122 U/L (ref 40–150)
ALT: 20 U/L (ref 0–55)
ANION GAP: 10 meq/L (ref 3–11)
AST: 18 U/L (ref 5–34)
Albumin: 3.7 g/dL (ref 3.5–5.0)
BILIRUBIN TOTAL: 0.31 mg/dL (ref 0.20–1.20)
BUN: 20.8 mg/dL (ref 7.0–26.0)
CO2: 26 mEq/L (ref 22–29)
CREATININE: 0.8 mg/dL (ref 0.6–1.1)
Calcium: 10 mg/dL (ref 8.4–10.4)
Chloride: 108 mEq/L (ref 98–109)
GLUCOSE: 87 mg/dL (ref 70–140)
Potassium: 4.1 mEq/L (ref 3.5–5.1)
Sodium: 144 mEq/L (ref 136–145)
Total Protein: 7 g/dL (ref 6.4–8.3)

## 2014-11-04 LAB — CBC WITH DIFFERENTIAL/PLATELET
BASO%: 1.1 % (ref 0.0–2.0)
BASOS ABS: 0.1 10*3/uL (ref 0.0–0.1)
EOS%: 2.8 % (ref 0.0–7.0)
Eosinophils Absolute: 0.2 10*3/uL (ref 0.0–0.5)
HCT: 33.9 % — ABNORMAL LOW (ref 34.8–46.6)
HGB: 11.1 g/dL — ABNORMAL LOW (ref 11.6–15.9)
LYMPH%: 18.6 % (ref 14.0–49.7)
MCH: 27.9 pg (ref 25.1–34.0)
MCHC: 32.6 g/dL (ref 31.5–36.0)
MCV: 85.3 fL (ref 79.5–101.0)
MONO#: 0.5 10*3/uL (ref 0.1–0.9)
MONO%: 6.2 % (ref 0.0–14.0)
NEUT#: 5.7 10*3/uL (ref 1.5–6.5)
NEUT%: 71.3 % (ref 38.4–76.8)
PLATELETS: 261 10*3/uL (ref 145–400)
RBC: 3.98 10*6/uL (ref 3.70–5.45)
RDW: 15.1 % — ABNORMAL HIGH (ref 11.2–14.5)
WBC: 8 10*3/uL (ref 3.9–10.3)
lymph#: 1.5 10*3/uL (ref 0.9–3.3)

## 2014-11-04 MED ORDER — SODIUM CHLORIDE 0.9 % IJ SOLN
10.0000 mL | INTRAMUSCULAR | Status: DC | PRN
  Administered 2014-11-04: 10 mL
  Filled 2014-11-04: qty 10

## 2014-11-04 MED ORDER — DIPHENHYDRAMINE HCL 25 MG PO CAPS
25.0000 mg | ORAL_CAPSULE | Freq: Once | ORAL | Status: AC
  Administered 2014-11-04: 25 mg via ORAL

## 2014-11-04 MED ORDER — TRASTUZUMAB CHEMO INJECTION 440 MG
6.0000 mg/kg | Freq: Once | INTRAVENOUS | Status: AC
  Administered 2014-11-04: 525 mg via INTRAVENOUS
  Filled 2014-11-04: qty 25

## 2014-11-04 MED ORDER — DIPHENHYDRAMINE HCL 25 MG PO CAPS
ORAL_CAPSULE | ORAL | Status: AC
  Filled 2014-11-04: qty 1

## 2014-11-04 MED ORDER — ACETAMINOPHEN 325 MG PO TABS
ORAL_TABLET | ORAL | Status: AC
  Filled 2014-11-04: qty 2

## 2014-11-04 MED ORDER — ZOLPIDEM TARTRATE ER 12.5 MG PO TBCR: 12.5000 mg | EXTENDED_RELEASE_TABLET | Freq: Every evening | ORAL | Status: DC | PRN

## 2014-11-04 MED ORDER — ACETAMINOPHEN 325 MG PO TABS
650.0000 mg | ORAL_TABLET | Freq: Once | ORAL | Status: AC
  Administered 2014-11-04: 650 mg via ORAL

## 2014-11-04 MED ORDER — HEPARIN SOD (PORK) LOCK FLUSH 100 UNIT/ML IV SOLN
500.0000 [IU] | Freq: Once | INTRAVENOUS | Status: AC | PRN
  Administered 2014-11-04: 500 [IU]
  Filled 2014-11-04: qty 5

## 2014-11-04 MED ORDER — SODIUM CHLORIDE 0.9 % IV SOLN
Freq: Once | INTRAVENOUS | Status: AC
  Administered 2014-11-04: 16:00:00 via INTRAVENOUS

## 2014-11-04 NOTE — Patient Instructions (Signed)
[facility] Discharge Instructions for Patients Receiving Chemotherapy  Today you received the following chemotherapy agents Herceptin.  To help prevent nausea and vomiting after your treatment, we encourage you to take your nausea medication as directed.   If you develop nausea and vomiting that is not controlled by your nausea medication, call the clinic.   BELOW ARE SYMPTOMS THAT SHOULD BE REPORTED IMMEDIATELY:  *FEVER GREATER THAN 100.5 F  *CHILLS WITH OR WITHOUT FEVER  NAUSEA AND VOMITING THAT IS NOT CONTROLLED WITH YOUR NAUSEA MEDICATION  *UNUSUAL SHORTNESS OF BREATH  *UNUSUAL BRUISING OR BLEEDING  TENDERNESS IN MOUTH AND THROAT WITH OR WITHOUT PRESENCE OF ULCERS  *URINARY PROBLEMS  *BOWEL PROBLEMS  UNUSUAL RASH Items with * indicate a potential emergency and should be followed up as soon as possible.  Feel free to call the clinic you have any questions or concerns. The clinic phone number is [phone].  

## 2014-11-06 ENCOUNTER — Encounter: Payer: Self-pay | Admitting: Hematology

## 2014-11-06 NOTE — Progress Notes (Signed)
[facility] MEDICAL ONCOLOGY PROGRESS NOTE DATE OF VISIT: 11/04/2014  Patient: Madeline Davidson   MR#: [id number]   CSN#: [id number]  PCP: [name], MD SU: Rolm Bookbinder MD OTHER MD: Thea Silversmith MD  CHIEF COMPLAINT: HER-2 positive/ ER positive breast cancer  CURRENT TREATMENT: anti-HER-2 immunotherapy  BREAST CANCER HISTORY: Madeline Davidson was last seen here by Madeline Davidson on 07/01/2014.  As per  Madeline Dana Allan original intake note:note:  " Patient on 12/16/2013 felt a lump in her right breast. Madeline Davidson states it was nickel size. Because of this Madeline Davidson 1 on to see her nurse practitioner. On 12/31/2013 a mammogram was ordered. This showed a lesion in the 7:00 position measuring 3.3 cm. Madeline Davidson was also noted to have enlarged lymph nodes. Subsequent ultrasound showed a 2.2 x 2.4 cm mass with enlarged lymph node measuring 1.6 cm. This was biopsied in [place]. 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 Lymph node was positive for metastatic disease. Prognostic markers revealed the tumor to be estrogen receptor +90% progesterone receptor 1% HER-2/neu positive."   Madeline Davidson was treated neoadjuvantly with carboplatin, docetaxel, trastuzumab and pertuzumab for 6 cycles completed 05/20/2014. Madeline Davidson is status post right mastectomy and sentinel lymph node sampling 06/22/2014 showing a complete pathologic response, with no residual disease in the breast and all 10 lymph nodes including all 3 sentinel lymph nodes clear. Madeline Davidson is continuing trastuzumab every 21 days through mid February 20 16th to complete one year; most recent echocardiogram 10/14/2014 showed a well preserved ejection fraction of 60-65%. Madeline Davidson started anastrozole 07/23/2014, to be continued for 5 years; if well tolerated will obtain DEXA scan.  MY PERSPECTIVE ON HER BREAST CANCER:  Both Madeline. Humphrey Rolls and Madeline Davidson mentioned the patient will be finishing Herceptin adjuvant after one year and anastrozole after 5 years. When I  reviewed her initial staging PET scan Madeline Davidson clearly had metastatic disease in the abdominal that is portal lymph nodes and following neoadjuvant chemotherapy, Madeline Davidson showed a very good response with those lymph nodes becoming negative. Those lymph nodes were never biopsied but based on the high SUV seen on the initial PET scan and a good response seen after chemotherapy, it would be considered clinically as stage IV disease and even though currently Madeline Davidson does not have any evidence of disease (NED), her management for the HER-2 positive breast cancer should be based on initial stage IV disease and to me that indicates Madeline Davidson should be on long-term Herceptin as well as the endocrine therapy. I will let Madeline.Feng also reviewed the situation and make recommendations. Patient is getting Herceptin today. I will be setting up another PET scan to be done on 12/12/2014 and then Madeline Davidson will have a follow-up here at [facility] on 12/15/2014. Here are some images from the initial PET scan done on 01/20/2014.        The second PET scan done on 06/01/2014 showed following images consistent with interval response.        Interim History:     Madeline Davidson returns today for followup of her breast cancer. Madeline Davidson underwent right mastectomy and sentinel lymph node sampling 06/22/2014. This showed ([id number]) a complete pathologic response, with no residual tumor in the breast and all 10 lymph nodes including 3 sentinel lymph nodes clear. Patient is doing well. Madeline Davidson does need a prescription for Ambien. Madeline Davidson denies any headaches, dizziness, shortness of breath, back pain, new lumps.her echocardiogram showed normal ejection fraction.  ROS: Madeline Davidson did well with her surgery, without unusual bleeding,  fever, pain, or rash. Madeline Davidson does have some fatigue and some general discomfort of course and is not yet ready to go for rehabilitation. Otherwise a detailed review of systems today was noncontributory.  Medications:  Current Outpatient  Prescriptions  Medication Sig Dispense Refill  . acetaminophen (TYLENOL) 500 MG tablet Take 1,000 mg by mouth every 6 (six) hours as needed for mild pain.    Marland Kitchen anastrozole (ARIMIDEX) 1 MG tablet Take 1 tablet (1 mg total) by mouth daily. 90 tablet 4  . B Complex-C (SUPER B COMPLEX PO) Take 1 tablet by mouth every morning.    . cetirizine (ZYRTEC) 10 MG tablet Take 10 mg by mouth daily as needed for allergies.    . clonazePAM (KLONOPIN) 0.5 MG tablet Take 0.5 mg by mouth 2 (two) times daily as needed for anxiety.    . lidocaine-prilocaine (EMLA) cream Apply 1 application topically as needed (for infusion site).    Marland Kitchen zolpidem (AMBIEN CR) 12.5 MG CR tablet Take 1 tablet (12.5 mg total) by mouth at bedtime as needed for sleep. 30 tablet 0   No current facility-administered medications for this visit.     Allergies:  Allergies  Allergen Reactions  . Ciprofloxacin Other (See Comments)    Patient had a headache after taking Cipro. Made her sinus infection symptoms worse.    Medical History: Past Medical History  Diagnosis Date  . Allergy   . Wears glasses   . 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 PONV (postoperative nausea and vomiting)   . Anxiety   . History of blood transfusion ~ 04/2014    "related to chemo"  . Anemia ~ 04/2014  . Arthritis     back    Surgical History:  Past Surgical History  Procedure Laterality Date  . Colon resection  2004    "for abscess on my colon"  . Hernia repair  2005    umb  . Cholecystectomy  2005  . Portacath placement Left 01/31/2014    Procedure: INSERTION PORT-A-CATH;  Surgeon: Rolm Bookbinder, MD;  Location: [facility];  Service: General;  Laterality: Left;  . Axillary lymph node biopsy Right 12/2013  . Breast biopsy Right 12/2013  . Colon surgery    . Mastectomy w/ sentinel node biopsy Right 06/22/2014    Procedure: RIGHT TOTAL MASTECTOMY WITH SENTINEL LYMPH NODE BIOPSY;  Surgeon: Rolm Bookbinder, MD;  Location: [facility];  Service: General;  Laterality: Right;   Gyn HISTORY:  SOCIAL HISTORY:   Physical Exam: middle-aged white woman who appears stated age 57 Vitals:   11/04/14 1421  BP: 144/85  Pulse: 90  Temp: 98.7 F (37.1 C)  Resp: 18   ECOG PERFORMANCE STATUS: 0-1  Sclerae unicteric, pupils equal and reactive Oropharynx clear and moist-- no thrush No cervical or supraclavicular adenopathy Lungs no rales or rhonchi Heart regular rate and rhythm Abd soft,  Obese,nontender, positive bowel sounds MSK no focal spinal tenderness, no upper extremity lymphedema, limited range of motion on right Neuro: nonfocal, well oriented, appropriate affect Breasts: The right breast site looks fine no local recurrence. The right axilla is benign. The left breast is unremarkable   Lab Results: Lab Results  Component Value Date   WBC 8.0 11/04/2014   HGB 11.1* 11/04/2014   HCT 33.9* 11/04/2014   MCV 85.3 11/04/2014   PLT 261 11/04/2014     Chemistry      Component Value Date/Time  NA 144 11/04/2014 1505   NA 142 06/23/2014 0338   K 4.1 11/04/2014 1505   K 4.3 06/23/2014 0338   CL 105 06/23/2014 0338   CO2 26 11/04/2014 1505   CO2 24 06/23/2014 0338   BUN 20.8 11/04/2014 1505   BUN 12 06/23/2014 0338   CREATININE 0.8 11/04/2014 1505   CREATININE 0.67 06/23/2014 0338      Component Value Date/Time   CALCIUM 10.0 11/04/2014 1505   CALCIUM 8.8 06/23/2014 0338   ALKPHOS 122 11/04/2014 1505   AST 18 11/04/2014 1505   ALT 20 11/04/2014 1505   BILITOT 0.31 11/04/2014 1505        Assessment: 58 y.o. [place], [place] woman status post right breast upper outer quadrant and right axillary lymph node biopsy 01/03/2014, both positive for a clinical T2 N1 M1, stage IV invasive ductal carcinoma, grade 2, estrogen receptor strongly positive, progesterone receptor 1% positive, with HER-2 amplification by immunohistochemistry  (1) treated neoadjuvantly with carboplatin,  docetaxel, trastuzumab and pertuzumab for 6 cycles completed 05/20/2014  (2) status post right mastectomy and sentinel lymph node sampling 06/22/2014 showing a complete pathologic response, with no residual disease in the breast and all 10 lymph nodes including all 3 sentinel lymph nodes clear  (3) continuing trastuzumab every 21 days through mid February 2016th to complete one year; most recent echocardiogram 06/01/2014 showed a well preserved ejection fraction  (4) starting anastrozole 07/23/2014, to be continued for 5 years; if well tolerated will obtain DEXA scan  PLAN:   1. The original adjuvant plan as outlined by Madeline. Humphrey Rolls and Madeline. Jana Davidson appears to be 1 year of Herceptin. My take on this is Madeline Davidson had clinically stage IV disease at the time of presentation and although Madeline Davidson showed a dramatic response and a complete pathologic response in the breast and lymph nodes, I would favor ongoing and long-term therapy with Herceptin with close cardiac monitoring and think that strategy will give her the best chance of staying in remission. Perhaps if Madeline Davidson is disease-free after 5 years, one can switch to Herceptin every month or every 6 weeks. I will let Madeline. Burr Medico also review her clinical situation and perhaps discussed that in the multidisciplinary breast tumor board after her next PET scan to decide regarding the duration of Herceptin and antiestrogen therapy. I explained my thought process to the patient.  2. Madeline Davidson will get Herceptin today and every 3 weeks. I did put standing orders for labs every 3 weeks. Madeline Davidson will be seeing Madeline. Burr Medico on 12/15/2014 to go over the results of the PET scan. I wished her good luck.    Bernadene Bell, MD Medical Hematologist/Oncologist [place] Pager: [phone] Office No: [phone]

## 2014-11-07 ENCOUNTER — Telehealth: Payer: Self-pay | Admitting: *Deleted

## 2014-11-07 ENCOUNTER — Other Ambulatory Visit: Payer: Self-pay | Admitting: Oncology

## 2014-11-07 ENCOUNTER — Telehealth: Payer: Self-pay | Admitting: Hematology

## 2014-11-07 NOTE — Telephone Encounter (Signed)
LM to confirm d/t for Dec. Mailed cal.

## 2014-11-07 NOTE — Telephone Encounter (Signed)
Per staff message and POF I have scheduled appts. Advised scheduler of appts. JMW  

## 2014-11-08 ENCOUNTER — Telehealth: Payer: Self-pay | Admitting: Hematology

## 2014-11-08 NOTE — Telephone Encounter (Signed)
due to YF on PAL 12/24 f/u moved from 12/24 to 12/4 per YF. lb/tx for 12/24 remains. s/w pt mom re change and new time for 12/4. schedule mailed.

## 2014-11-16 ENCOUNTER — Encounter (HOSPITAL_COMMUNITY): Payer: Self-pay

## 2014-11-16 ENCOUNTER — Ambulatory Visit (HOSPITAL_COMMUNITY)
Admission: RE | Admit: 2014-11-16 | Discharge: 2014-11-16 | Disposition: A | Discharge status: Home / Self Care | Payer: BC Managed Care – PPO | Source: Ambulatory Visit | Attending: Internal Medicine | Admitting: Internal Medicine

## 2014-11-16 VITALS — BP 134/80 | HR 87 | Wt 192.8 lb

## 2014-11-16 DIAGNOSIS — C50411 Malignant neoplasm of upper-outer quadrant of right female breast: Secondary | ICD-10-CM | POA: Insufficient documentation

## 2014-11-16 NOTE — Progress Notes (Signed)
Patient ID: Madeline Davidson, female   DOB: [birth year], 58 y.o.   MRN: [id number] Oncologist: Dr Annamaria Boots PCP: Dr Norma Fredrickson  HPI:  Madeline Davidson is a 58 year old female with HTN, obesity and right breast cancer in the lower outer quadrant stage II (T2, N1). She was referred to the cardio-oncology clinic by Dr Humphrey Rolls   She started chemo 02/03/14. Has finished chemo. Now getting Herceptin every 3 weeks for 1 year. S/p mastectomy in July 2015.   Patient stopped her lisinopril/HCTZ in 5/15 due to low blood pressure and dehydration.  Feels good. Denies dyspnea. No swelling. Does all activities without problems. + arthitis pain. Tachycardia has improved.    ECHO 01/24/14 EF 60-65% Lateral S' 9.8 ECHO 6/15 EF 60-65%, lateral S' 12.8, strain not done Echo 10/14/14  EF 60-65% lateral s' 11.1 GLS -23.8%  FH: Uncle has HF. Mom has HTN   SH: Lives with her Mom. Works full time [employer]: All systems reviewed and negative except as per HPI.   Past Medical History  Diagnosis Date  . Allergy   . Wears glasses   . 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 PONV (postoperative nausea and vomiting)   . Anxiety   . History of blood transfusion ~ 04/2014    "related to chemo"  . Anemia ~ 04/2014  . Arthritis     back    Current Outpatient Prescriptions  Medication Sig Dispense Refill  . acetaminophen (TYLENOL) 500 MG tablet Take 1,000 mg by mouth every 6 (six) hours as needed for mild pain.    Marland Kitchen anastrozole (ARIMIDEX) 1 MG tablet Take 1 tablet (1 mg total) by mouth daily. 90 tablet 4  . cetirizine (ZYRTEC) 10 MG tablet Take 10 mg by mouth daily as needed for allergies.    . clonazePAM (KLONOPIN) 0.5 MG tablet Take 0.5 mg by mouth 2 (two) times daily as needed for anxiety.    Marland Kitchen ibuprofen (ADVIL,MOTRIN) 800 MG tablet Take 800 mg by mouth every 8 (eight) hours as needed for moderate pain.    Marland Kitchen lidocaine-prilocaine (EMLA) cream Apply 1 application  topically as needed (for infusion site).    Marland Kitchen zolpidem (AMBIEN CR) 12.5 MG CR tablet Take 1 tablet (12.5 mg total) by mouth at bedtime as needed for sleep. 30 tablet 0   No current facility-administered medications for this encounter.     Allergies  Allergen Reactions  . Ciprofloxacin Other (See Comments)    Patient had a headache after taking Cipro. Made her sinus infection symptoms worse.    History   Social History  . Marital Status: Divorced    Spouse Name: N/A    Number of Children: N/A  . Years of Education: N/A   Occupational History  . Not on file.   Social History Main Topics  . Smoking status: Never Smoker   . Smokeless tobacco: Never Used  . Alcohol Use: No  . Drug Use: No  . Sexual Activity: No   Other Topics Concern  . Not on file   Social History Narrative    Family History  Problem Relation Age of Onset  . Cancer Father 74    colon cancer  . Hypertension Mother     PHYSICAL EXAM: Filed Vitals:   11/16/14 1133  BP: 134/80  Pulse: 87   General:  Well appearing. No respiratory difficulty HEENT: normal Neck: supple. no JVD. Carotids 2+ bilat; no bruits.  No lymphadenopathy or thryomegaly appreciated. Cor: PMI nondisplaced. Regular rate & rhythm. No rubs, gallops or murmurs. Lungs: clear Abdomen: soft, nontender, nondistended. No hepatosplenomegaly. No bruits or masses. Good bowel sounds. Extremities: no cyanosis, clubbing, rash, edema Neuro: alert & oriented x 3, cranial nerves grossly intact. moves all 4 extremities w/o difficulty. Affect pleasant.    No results found for this or any previous visit (from the past 24 hour(s)). No results found.   ASSESSMENT & PLAN: 1. R Breast Cancer: I reviewed echos personally. EF and Doppler parameters stable. No HF on exam. Continue Herceptin. Repeat echo in 3 months.   Benay Spice 11/16/2014

## 2014-11-16 NOTE — Patient Instructions (Signed)
We will contact you in 3 months to schedule your next appointment and echocardiogram  

## 2014-11-16 NOTE — Addendum Note (Signed)
Encounter addended by: Scarlette Calico, RN on: 11/16/2014 12:18 PM<BR>     Documentation filed: Patient Instructions Section

## 2014-11-25 ENCOUNTER — Ambulatory Visit: Payer: BC Managed Care – PPO

## 2014-11-25 ENCOUNTER — Other Ambulatory Visit (HOSPITAL_BASED_OUTPATIENT_CLINIC_OR_DEPARTMENT_OTHER): Payer: BC Managed Care – PPO

## 2014-11-25 ENCOUNTER — Ambulatory Visit (HOSPITAL_BASED_OUTPATIENT_CLINIC_OR_DEPARTMENT_OTHER): Payer: BC Managed Care – PPO | Admitting: Hematology

## 2014-11-25 ENCOUNTER — Ambulatory Visit (HOSPITAL_BASED_OUTPATIENT_CLINIC_OR_DEPARTMENT_OTHER): Payer: BC Managed Care – PPO

## 2014-11-25 VITALS — BP 151/77 | HR 94 | Temp 98.4°F | Resp 18 | Ht 64.0 in | Wt 191.0 lb

## 2014-11-25 DIAGNOSIS — C50911 Malignant neoplasm of unspecified site of right female breast: Secondary | ICD-10-CM

## 2014-11-25 DIAGNOSIS — C50411 Malignant neoplasm of upper-outer quadrant of right female breast: Secondary | ICD-10-CM

## 2014-11-25 DIAGNOSIS — D649 Anemia, unspecified: Secondary | ICD-10-CM

## 2014-11-25 DIAGNOSIS — C7989 Secondary malignant neoplasm of other specified sites: Secondary | ICD-10-CM

## 2014-11-25 DIAGNOSIS — Z5112 Encounter for antineoplastic immunotherapy: Secondary | ICD-10-CM

## 2014-11-25 LAB — COMPREHENSIVE METABOLIC PANEL (CC13)
ALBUMIN: 3.7 g/dL (ref 3.5–5.0)
ALT: 18 U/L (ref 0–55)
ANION GAP: 12 meq/L — AB (ref 3–11)
AST: 16 U/L (ref 5–34)
Alkaline Phosphatase: 139 U/L (ref 40–150)
BUN: 18.2 mg/dL (ref 7.0–26.0)
CALCIUM: 10.1 mg/dL (ref 8.4–10.4)
CHLORIDE: 106 meq/L (ref 98–109)
CO2: 28 meq/L (ref 22–29)
CREATININE: 0.9 mg/dL (ref 0.6–1.1)
EGFR: 73 mL/min/{1.73_m2} — ABNORMAL LOW (ref >90)
Glucose: 111 mg/dl (ref 70–140)
POTASSIUM: 4.1 meq/L (ref 3.5–5.1)
Sodium: 146 mEq/L — ABNORMAL HIGH (ref 136–145)
Total Bilirubin: 0.39 mg/dL (ref 0.20–1.20)
Total Protein: 6.9 g/dL (ref 6.4–8.3)

## 2014-11-25 MED ORDER — HEPARIN SOD (PORK) LOCK FLUSH 100 UNIT/ML IV SOLN
500.0000 [IU] | Freq: Once | INTRAVENOUS | Status: AC | PRN
  Administered 2014-11-25: 500 [IU]
  Filled 2014-11-25: qty 5

## 2014-11-25 MED ORDER — DIPHENHYDRAMINE HCL 25 MG PO CAPS
ORAL_CAPSULE | ORAL | Status: AC
  Filled 2014-11-25: qty 1

## 2014-11-25 MED ORDER — DIPHENHYDRAMINE HCL 25 MG PO CAPS
25.0000 mg | ORAL_CAPSULE | Freq: Once | ORAL | Status: AC
  Administered 2014-11-25: 25 mg via ORAL

## 2014-11-25 MED ORDER — SODIUM CHLORIDE 0.9 % IJ SOLN
10.0000 mL | INTRAMUSCULAR | Status: DC | PRN
  Administered 2014-11-25: 10 mL
  Filled 2014-11-25: qty 10

## 2014-11-25 MED ORDER — ANASTROZOLE 1 MG PO TABS: 1.0000 mg | ORAL_TABLET | Freq: Every day | ORAL | Status: DC

## 2014-11-25 MED ORDER — ACETAMINOPHEN 325 MG PO TABS
650.0000 mg | ORAL_TABLET | Freq: Once | ORAL | Status: AC
  Administered 2014-11-25: 650 mg via ORAL

## 2014-11-25 MED ORDER — ACETAMINOPHEN 325 MG PO TABS
ORAL_TABLET | ORAL | Status: AC
  Filled 2014-11-25: qty 2

## 2014-11-25 MED ORDER — TRASTUZUMAB CHEMO INJECTION 440 MG
6.0000 mg/kg | Freq: Once | INTRAVENOUS | Status: AC
  Administered 2014-11-25: 525 mg via INTRAVENOUS
  Filled 2014-11-25: qty 25

## 2014-11-25 MED ORDER — SODIUM CHLORIDE 0.9 % IV SOLN
Freq: Once | INTRAVENOUS | Status: AC
  Administered 2014-11-25: 12:00:00 via INTRAVENOUS

## 2014-11-25 NOTE — Patient Instructions (Signed)
[facility] Discharge Instructions for Patients Receiving Chemotherapy  Today you received the following chemotherapy agents Herceptin.  To help prevent nausea and vomiting after your treatment, we encourage you to take your nausea medication as directed.   If you develop nausea and vomiting that is not controlled by your nausea medication, call the clinic.   BELOW ARE SYMPTOMS THAT SHOULD BE REPORTED IMMEDIATELY:  *FEVER GREATER THAN 100.5 F  *CHILLS WITH OR WITHOUT FEVER  NAUSEA AND VOMITING THAT IS NOT CONTROLLED WITH YOUR NAUSEA MEDICATION  *UNUSUAL SHORTNESS OF BREATH  *UNUSUAL BRUISING OR BLEEDING  TENDERNESS IN MOUTH AND THROAT WITH OR WITHOUT PRESENCE OF ULCERS  *URINARY PROBLEMS  *BOWEL PROBLEMS  UNUSUAL RASH Items with * indicate a potential emergency and should be followed up as soon as possible.  Feel free to call the clinic you have any questions or concerns. The clinic phone number is [phone].  

## 2014-11-26 ENCOUNTER — Encounter: Payer: Self-pay | Admitting: Hematology

## 2014-11-26 LAB — CBC WITH DIFFERENTIAL/PLATELET
BASO%: 0.3 % (ref 0.0–2.0)
Basophils Absolute: 0 10*3/uL (ref 0.0–0.1)
EOS ABS: 0.2 10*3/uL (ref 0.0–0.5)
EOS%: 3.1 % (ref 0.0–7.0)
HEMATOCRIT: 37 % (ref 34.8–46.6)
HGB: 11.8 g/dL (ref 11.6–15.9)
LYMPH#: 1.3 10*3/uL (ref 0.9–3.3)
LYMPH%: 17.9 % (ref 14.0–49.7)
MCH: 28.2 pg (ref 25.1–34.0)
MCHC: 31.9 g/dL (ref 31.5–36.0)
MCV: 88.3 fL (ref 79.5–101.0)
MONO#: 0.5 10*3/uL (ref 0.1–0.9)
MONO%: 7.3 % (ref 0.0–14.0)
NEUT%: 71.4 % (ref 38.4–76.8)
NEUTROS ABS: 5 10*3/uL (ref 1.5–6.5)
NRBC: 0 % (ref 0–0)
Platelets: 197 10*3/uL (ref 145–400)
RBC: 4.19 10*6/uL (ref 3.70–5.45)
RDW: 14.5 % (ref 11.2–14.5)
WBC: 7 10*3/uL (ref 3.9–10.3)

## 2014-11-26 MED ORDER — ESCITALOPRAM OXALATE 10 MG PO TABS: 10.0000 mg | ORAL_TABLET | Freq: Every day | ORAL | Status: DC

## 2014-11-26 NOTE — Progress Notes (Signed)
[facility] OFFICE PROGRESS NOTE  Patient Care Team: Donnita Falls, MD as PCP - General (Nurse Practitioner)  DIAGNOSIS Metastatic right breast cancer to periportal nodes, ER+/PR+/HER2+  SUMMARY OF ONCOLOGIC HISTORY:   Breast cancer of upper-outer quadrant of right female breast   01/03/2014 Initial Diagnosis Breast cancer of upper-outer quadrant of right female breast, IDA, grade 2, ER 90%+, PR 1%+, her2 (+)   01/20/2014 PET scan Hypermetabolic mass in the posterior right breast (primary), 2 additional nodules in deep right breast (likely nodes) and 3 hyermetabolic portal nodes (lasrgest 2cm, SUV 13).    02/03/2014 - 05/20/2014 Neo-Adjuvant Chemotherapy docetaxel, Taxotere, Carboplatin, Herceptin, Perjeta for 6 cycles    06/01/2014 PET scan Minimal residual hypermetabolic activity within the residual mass inferolaterally in the right breast, likely surgical change. No  other hypermetabolic node or lesion.    06/22/2014 Surgery right mastectomy with negative margines and axillary node dissection. ypT0N0, complete patholigical resposne.    CURRENT THERAPY:  1.Trastuzumab 6 mg/kg every 3 weeks 2. Anastrozole 1 mg once daily  INTERVAL HISTORY: Dashauna returns for follow-up and Herceptin treatment. She was previously under Dr. Welton Flakes and Dr. Baxter Kail care, who have left the practice. She is tolerating the Herceptin maintenance therapy very well. She has some joint agonist, but manageable. She has good appetite and energy level. She has been quite stressed and depressed since Dr. Laverda Page told her she probably has metastatic disease on last visit.  REVIEW OF SYSTEMS:   Constitutional: Denies fevers, chills or abnormal weight loss Eyes: Denies blurriness of vision Ears, nose, mouth, throat, and face: Denies mucositis or sore throat Respiratory: Denies cough, dyspnea or wheezes Cardiovascular: Denies palpitation, chest discomfort or lower extremity swelling Gastrointestinal:  Denies  nausea, heartburn or change in bowel habits Skin: Denies abnormal skin rashes Lymphatics: Denies new lymphadenopathy or easy bruising Neurological:Denies numbness, tingling or new weaknesses Behavioral/Psych: Mood is stable, no new changes  All other systems were reviewed with the patient and are negative.  MEDICAL HISTORY:  Past Medical History  Diagnosis Date  . Allergy   . Wears glasses   . 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 PONV (postoperative nausea and vomiting)   . Anxiety   . History of blood transfusion ~ 04/2014    "related to chemo"  . Anemia ~ 04/2014  . Arthritis     back    SURGICAL HISTORY: Past Surgical History  Procedure Laterality Date  . Colon resection  2004    "for abscess on my colon"  . Hernia repair  2005    umb  . Cholecystectomy  2005  . Portacath placement Left 01/31/2014    Procedure: INSERTION PORT-A-CATH;  Surgeon: Emelia Loron, MD;  Location: [facility] OR;  Service: General;  Laterality: Left;  . Axillary lymph node biopsy Right 12/2013  . Breast biopsy Right 12/2013  . Colon surgery    . Mastectomy w/ sentinel node biopsy Right 06/22/2014    Procedure: RIGHT TOTAL MASTECTOMY WITH SENTINEL LYMPH NODE BIOPSY;  Surgeon: Emelia Loron, MD;  Location: [facility] OR;  Service: General;  Laterality: Right;    I have reviewed the social history and family history with the patient and they are unchanged from previous note.  ALLERGIES:  is allergic to ciprofloxacin.  MEDICATIONS:  Current Outpatient Prescriptions  Medication Sig Dispense Refill  . acetaminophen (TYLENOL) 500 MG tablet Take 1,000 mg by mouth every 6 (six) hours as needed  for mild pain.    Marland Kitchen anastrozole (ARIMIDEX) 1 MG tablet Take 1 tablet (1 mg total) by mouth daily. 90 tablet 4  . cetirizine (ZYRTEC) 10 MG tablet Take 10 mg by mouth daily as needed for allergies.    . clonazePAM (KLONOPIN) 0.5 MG tablet Take 0.5 mg by mouth 2 (two)  times daily as needed for anxiety.    Marland Kitchen ibuprofen (ADVIL,MOTRIN) 800 MG tablet Take 800 mg by mouth every 8 (eight) hours as needed for moderate pain.    Marland Kitchen lidocaine-prilocaine (EMLA) cream Apply 1 application topically as needed (for infusion site).    Marland Kitchen zolpidem (AMBIEN CR) 12.5 MG CR tablet Take 1 tablet (12.5 mg total) by mouth at bedtime as needed for sleep. 30 tablet 0   No current facility-administered medications for this visit.    PHYSICAL EXAMINATION: ECOG PERFORMANCE STATUS: 0 - Asymptomatic  Filed Vitals:   11/25/14 1035  BP: 151/77  Pulse: 94  Temp: 98.4 F (36.9 C)  Resp: 18   Filed Weights   11/25/14 1035  Weight: 191 lb (86.637 kg)    GENERAL:alert, no distress and comfortable SKIN: skin color, texture, turgor are normal, no rashes or significant lesions EYES: normal, Conjunctiva are pink and non-injected, sclera clear OROPHARYNX:no exudate, no erythema and lips, buccal mucosa, and tongue normal  NECK: supple, thyroid normal size, non-tender, without nodularity LYMPH:  no palpable lymphadenopathy in the cervical, axillary or inguinal LUNGS: clear to auscultation and percussion with normal breathing effort HEART: regular rate & rhythm and no murmurs and no lower extremity edema ABDOMEN:abdomen soft, non-tender and normal bowel sounds Musculoskeletal:no cyanosis of digits and no clubbing  NEURO: alert & oriented x 3 with fluent speech, no focal motor/sensory deficits BREAST: Right breast surgically absent, no palpable lesions on chest wall or axilla note. Left breast exam no palpable mass, skin change or nipple  large.  LABORATORY DATA:  CBC Latest Ref Rng 11/04/2014 10/14/2014 09/23/2014  WBC 3.9 - 10.3 10e3/uL 8.0 8.4 6.8  Hemoglobin 11.6 - 15.9 g/dL 11.1(L) 11.2(L) 10.6(L)  Hematocrit 34.8 - 46.6 % 33.9(L) 34.9 33.4(L)  Platelets 145 - 400 10e3/uL 261 256 241    CMP Latest Ref Rng 11/25/2014 11/04/2014 10/14/2014  Glucose 70 - 140 mg/dl 111 87 123  BUN  7.0 - 26.0 mg/dL 18.2 20.8 22.7  Creatinine 0.6 - 1.1 mg/dL 0.9 0.8 0.9  Sodium 136 - 145 mEq/L 146(H) 144 146(H)  Potassium 3.5 - 5.1 mEq/L 4.1 4.1 3.9  Chloride 96 - 112 mEq/L - - -  CO2 22 - 29 mEq/L $Remove'28 26 24  'fGxCwyr$ Calcium 8.4 - 10.4 mg/dL 10.1 10.0 9.9  Total Protein 6.4 - 8.3 g/dL 6.9 7.0 6.7  Total Bilirubin 0.20 - 1.20 mg/dL 0.39 0.31 0.33  Alkaline Phos 40 - 150 U/L 139 122 119  AST 5 - 34 U/L $Remo'16 18 17  'sYzfi$ ALT 0 - 55 U/L $Remo'18 20 18      'SNTsL$ RADIOGRAPHIC STUDIES: I have personally reviewed the radiological images as listed and agreed with the findings in the report. No new scans since last visit  ASSESSMENT & PLAN:  58 year old postmenopausal woman, was found to have a 3.3 right breast mass and a positive axillary lymph nodes, biopsy confirmed invasive ductal carcinoma, ER positive PR positive HER-2 negative. Her initial PET scan also reviewed 3 hypermetabolic nodes in periportal, measuring up to 2 cm with SUV 14. The periportal node was not bopsied, but likely represented metastatic nodes. She had completed response  to neoadjuvant chemotherapy and underwent right mastectomy.  1. Metastatic right breast cancer to periportal nodes, ER+/PR+/HER2+ -Her case was reviewed in our tumor board this week. Her initial and a post treatment PET scan was reviewed again. Due to the size and metabolic activity of those periportal lymph nodes at diagnosis, she likely has metastatic breast cancer. And the consensus was to continue therapy indefinitely. -Also She had excellent compete response to chemotherapy, She is at VERY high risk for cancer recurrence due to the nature of metastatic disease. We recommend her to continue maintenance Herceptin and anastrozole indefinitely. She agrees with the plan.  -We'll monitor her apical every 3 months when she is on Herceptin therapy, next due on 01/14/2015. -We discussed a bone marrow was, she will continue calcium and vitamin D supplements. -I suggest her to take  Tylenol and ibuprofen alternatively for her arthralgia. -I encouraged her to continue exercise and remain to be active. -I'll reschedule her restaging PET scan to early January, before I see her next time  2.  Anxiety and depression -She is on low-dose clonazepam, still has trouble with sleeping and anxiety. I told her to increase clonazepam to 1 mg as needed -I suggest her to take SSRI, and hopefully she can wean off Klonopin.. She took Zoloft before and did not like it, I called in lexapro $RemoveBe'10mg'mXbuaYoYz$  dialy.   3. Anemia, likely secondary to chemotherapy. -Improved  Follow-up: 6 weeks, with restaging PET scan.  All questions were answered. The patient knows to call the clinic with any problems, questions or concerns. No barriers to learning was detected.  I spent 30 minutes counseling the patient face to face. The total time spent in the appointment was 40 minutes and more than 50% was on counseling and review of test results     Truitt Merle, MD 11/26/2014 10:57 AM

## 2014-11-28 ENCOUNTER — Telehealth: Payer: Self-pay | Admitting: Hematology

## 2014-11-28 ENCOUNTER — Other Ambulatory Visit: Payer: Self-pay | Admitting: *Deleted

## 2014-11-28 MED ORDER — ESCITALOPRAM OXALATE 10 MG PO TABS: 10.0000 mg | ORAL_TABLET | Freq: Every day | ORAL | Status: DC

## 2014-11-28 NOTE — Telephone Encounter (Signed)
Confirm appt for Jan 2016. Mailed cal.

## 2014-11-30 ENCOUNTER — Other Ambulatory Visit: Payer: Self-pay | Admitting: *Deleted

## 2014-11-30 MED ORDER — ESCITALOPRAM OXALATE 10 MG PO TABS: 10.0000 mg | ORAL_TABLET | Freq: Every day | ORAL | Status: DC

## 2014-12-12 ENCOUNTER — Ambulatory Visit (HOSPITAL_COMMUNITY): Payer: BC Managed Care – PPO

## 2014-12-14 ENCOUNTER — Other Ambulatory Visit: Payer: Self-pay | Admitting: Hematology

## 2014-12-15 ENCOUNTER — Ambulatory Visit (HOSPITAL_BASED_OUTPATIENT_CLINIC_OR_DEPARTMENT_OTHER): Payer: BC Managed Care – PPO

## 2014-12-15 ENCOUNTER — Other Ambulatory Visit (HOSPITAL_BASED_OUTPATIENT_CLINIC_OR_DEPARTMENT_OTHER): Payer: BC Managed Care – PPO

## 2014-12-15 ENCOUNTER — Ambulatory Visit: Payer: BC Managed Care – PPO | Admitting: Hematology

## 2014-12-15 ENCOUNTER — Other Ambulatory Visit: Payer: Self-pay | Admitting: *Deleted

## 2014-12-15 DIAGNOSIS — C773 Secondary and unspecified malignant neoplasm of axilla and upper limb lymph nodes: Secondary | ICD-10-CM

## 2014-12-15 DIAGNOSIS — C50411 Malignant neoplasm of upper-outer quadrant of right female breast: Secondary | ICD-10-CM

## 2014-12-15 DIAGNOSIS — C50419 Malignant neoplasm of upper-outer quadrant of unspecified female breast: Secondary | ICD-10-CM

## 2014-12-15 DIAGNOSIS — C50911 Malignant neoplasm of unspecified site of right female breast: Secondary | ICD-10-CM

## 2014-12-15 DIAGNOSIS — Z5112 Encounter for antineoplastic immunotherapy: Secondary | ICD-10-CM

## 2014-12-15 DIAGNOSIS — G4700 Insomnia, unspecified: Secondary | ICD-10-CM

## 2014-12-15 LAB — CBC WITH DIFFERENTIAL/PLATELET
BASO%: 0.3 % (ref 0.0–2.0)
Basophils Absolute: 0 10*3/uL (ref 0.0–0.1)
EOS%: 2.8 % (ref 0.0–7.0)
Eosinophils Absolute: 0.2 10*3/uL (ref 0.0–0.5)
HEMATOCRIT: 34.9 % (ref 34.8–46.6)
HEMOGLOBIN: 11.4 g/dL — AB (ref 11.6–15.9)
LYMPH#: 1.2 10*3/uL (ref 0.9–3.3)
LYMPH%: 18.3 % (ref 14.0–49.7)
MCH: 28.6 pg (ref 25.1–34.0)
MCHC: 32.7 g/dL (ref 31.5–36.0)
MCV: 87.7 fL (ref 79.5–101.0)
MONO#: 0.4 10*3/uL (ref 0.1–0.9)
MONO%: 5.7 % (ref 0.0–14.0)
NEUT#: 4.8 10*3/uL (ref 1.5–6.5)
NEUT%: 72.9 % (ref 38.4–76.8)
Platelets: 195 10*3/uL (ref 145–400)
RBC: 3.98 10*6/uL (ref 3.70–5.45)
RDW: 13.9 % (ref 11.2–14.5)
WBC: 6.5 10*3/uL (ref 3.9–10.3)

## 2014-12-15 LAB — COMPREHENSIVE METABOLIC PANEL (CC13)
ALBUMIN: 3.6 g/dL (ref 3.5–5.0)
ALT: 16 U/L (ref 0–55)
ANION GAP: 6 meq/L (ref 3–11)
AST: 16 U/L (ref 5–34)
Alkaline Phosphatase: 113 U/L (ref 40–150)
BUN: 19.9 mg/dL (ref 7.0–26.0)
CALCIUM: 9.5 mg/dL (ref 8.4–10.4)
CHLORIDE: 109 meq/L (ref 98–109)
CO2: 27 meq/L (ref 22–29)
CREATININE: 0.8 mg/dL (ref 0.6–1.1)
EGFR: 76 mL/min/{1.73_m2} — ABNORMAL LOW (ref >90)
Glucose: 107 mg/dl (ref 70–140)
POTASSIUM: 3.8 meq/L (ref 3.5–5.1)
Sodium: 142 mEq/L (ref 136–145)
TOTAL PROTEIN: 6.6 g/dL (ref 6.4–8.3)
Total Bilirubin: 0.28 mg/dL (ref 0.20–1.20)

## 2014-12-15 MED ORDER — TRASTUZUMAB CHEMO INJECTION 440 MG
6.0000 mg/kg | Freq: Once | INTRAVENOUS | Status: AC
  Administered 2014-12-15: 525 mg via INTRAVENOUS
  Filled 2014-12-15: qty 25

## 2014-12-15 MED ORDER — DIPHENHYDRAMINE HCL 25 MG PO CAPS
ORAL_CAPSULE | ORAL | Status: AC
  Filled 2014-12-15: qty 1

## 2014-12-15 MED ORDER — SODIUM CHLORIDE 0.9 % IJ SOLN
10.0000 mL | INTRAMUSCULAR | Status: DC | PRN
  Administered 2014-12-15: 10 mL
  Filled 2014-12-15: qty 10

## 2014-12-15 MED ORDER — ACETAMINOPHEN 325 MG PO TABS
ORAL_TABLET | ORAL | Status: AC
  Filled 2014-12-15: qty 2

## 2014-12-15 MED ORDER — HEPARIN SOD (PORK) LOCK FLUSH 100 UNIT/ML IV SOLN
500.0000 [IU] | Freq: Once | INTRAVENOUS | Status: AC | PRN
  Administered 2014-12-15: 500 [IU]
  Filled 2014-12-15: qty 5

## 2014-12-15 MED ORDER — DIPHENHYDRAMINE HCL 25 MG PO CAPS
25.0000 mg | ORAL_CAPSULE | Freq: Once | ORAL | Status: AC
  Administered 2014-12-15: 25 mg via ORAL

## 2014-12-15 MED ORDER — ZOLPIDEM TARTRATE ER 12.5 MG PO TBCR: 12.5000 mg | EXTENDED_RELEASE_TABLET | Freq: Every evening | ORAL | Status: DC | PRN

## 2014-12-15 MED ORDER — SODIUM CHLORIDE 0.9 % IV SOLN
Freq: Once | INTRAVENOUS | Status: AC
  Administered 2014-12-15: 11:00:00 via INTRAVENOUS

## 2014-12-15 MED ORDER — ACETAMINOPHEN 325 MG PO TABS
650.0000 mg | ORAL_TABLET | Freq: Once | ORAL | Status: AC
  Administered 2014-12-15: 650 mg via ORAL

## 2014-12-15 NOTE — Patient Instructions (Signed)
[facility] Discharge Instructions for Patients Receiving Chemotherapy  Today you received the following chemotherapy agents Herceptin  To help prevent nausea and vomiting after your treatment, we encourage you to take your nausea medication     If you develop nausea and vomiting that is not controlled by your nausea medication, call the clinic.   BELOW ARE SYMPTOMS THAT SHOULD BE REPORTED IMMEDIATELY:  *FEVER GREATER THAN 100.5 F  *CHILLS WITH OR WITHOUT FEVER  NAUSEA AND VOMITING THAT IS NOT CONTROLLED WITH YOUR NAUSEA MEDICATION  *UNUSUAL SHORTNESS OF BREATH  *UNUSUAL BRUISING OR BLEEDING  TENDERNESS IN MOUTH AND THROAT WITH OR WITHOUT PRESENCE OF ULCERS  *URINARY PROBLEMS  *BOWEL PROBLEMS  UNUSUAL RASH Items with * indicate a potential emergency and should be followed up as soon as possible.  Feel free to call the clinic you have any questions or concerns. The clinic phone number is [phone].    

## 2014-12-27 ENCOUNTER — Other Ambulatory Visit: Payer: Self-pay | Admitting: Hematology

## 2014-12-27 ENCOUNTER — Ambulatory Visit (HOSPITAL_COMMUNITY)
Admission: RE | Admit: 2014-12-27 | Discharge: 2014-12-27 | Disposition: A | Discharge status: Home / Self Care | Payer: BLUE CROSS/BLUE SHIELD | Source: Ambulatory Visit | Attending: Hematology | Admitting: Hematology

## 2014-12-27 DIAGNOSIS — C50911 Malignant neoplasm of unspecified site of right female breast: Secondary | ICD-10-CM

## 2014-12-27 LAB — GLUCOSE, CAPILLARY: GLUCOSE-CAPILLARY: 91 mg/dL (ref 70–99)

## 2014-12-27 MED ORDER — FLUDEOXYGLUCOSE F - 18 (FDG) INJECTION
9.8000 | Freq: Once | INTRAVENOUS | Status: AC | PRN
  Administered 2014-12-27: 9.8 via INTRAVENOUS

## 2014-12-29 ENCOUNTER — Telehealth: Payer: Self-pay | Admitting: Hematology

## 2014-12-29 ENCOUNTER — Ambulatory Visit (HOSPITAL_BASED_OUTPATIENT_CLINIC_OR_DEPARTMENT_OTHER): Payer: BLUE CROSS/BLUE SHIELD | Admitting: Hematology

## 2014-12-29 ENCOUNTER — Encounter: Payer: Self-pay | Admitting: Hematology

## 2014-12-29 ENCOUNTER — Telehealth: Payer: Self-pay | Admitting: *Deleted

## 2014-12-29 VITALS — BP 134/87 | HR 86 | Temp 98.2°F | Resp 19 | Ht 64.0 in | Wt 191.2 lb

## 2014-12-29 DIAGNOSIS — D6481 Anemia due to antineoplastic chemotherapy: Secondary | ICD-10-CM

## 2014-12-29 DIAGNOSIS — C50419 Malignant neoplasm of upper-outer quadrant of unspecified female breast: Secondary | ICD-10-CM

## 2014-12-29 DIAGNOSIS — C50411 Malignant neoplasm of upper-outer quadrant of right female breast: Secondary | ICD-10-CM

## 2014-12-29 DIAGNOSIS — F329 Major depressive disorder, single episode, unspecified: Secondary | ICD-10-CM

## 2014-12-29 DIAGNOSIS — G4700 Insomnia, unspecified: Secondary | ICD-10-CM

## 2014-12-29 MED ORDER — ZOLPIDEM TARTRATE ER 12.5 MG PO TBCR: 12.5000 mg | EXTENDED_RELEASE_TABLET | Freq: Every evening | ORAL | Status: DC | PRN

## 2014-12-29 NOTE — Telephone Encounter (Signed)
Pt confirmed labs/ov per 01/07 POF, gave pt AVS..... KJ, sent msg to add chemo °

## 2014-12-29 NOTE — Telephone Encounter (Signed)
Per staff message and POF I have scheduled appts. Advised scheduler of appts. JMW  

## 2014-12-29 NOTE — Progress Notes (Signed)
[facility]  Telephone:(336) [phone] [phone]) [phone]  Clinic Follow up Note   Patient Care Team: [name], MD as PCP - General (Nurse Practitioner) 12/29/2014  SUMMARY OF ONCOLOGIC HISTORY: Breast cancer of upper-outer quadrant of right female breast   Staging form: Breast, AJCC 7th Edition     Clinical: Stage IV (T2, N1, M1) - Signed by Truitt Merle, MD on 12/29/2014       Prognostic indicators: ER/PR/HER2NEU +      Pathologic: No stage assigned - Unsigned       Prognostic indicators: ER/PR/HER2NEU +      Breast cancer of upper-outer quadrant of right female breast   01/03/2014 Initial Diagnosis Breast cancer of upper-outer quadrant of right female breast, IDA, grade 2, ER 90%+, PR 1%+, her2 (+)   01/20/2014 PET scan Hypermetabolic mass in the posterior right breast (primary), 2 additional nodules in deep right breast (likely nodes) and 3 hyermetabolic portal nodes (lasrgest 2cm, SUV 13).    02/03/2014 - 05/20/2014 Neo-Adjuvant Chemotherapy docetaxel, Taxotere, Carboplatin, Herceptin, Perjeta for 6 cycles    06/01/2014 PET scan Minimal residual hypermetabolic activity within the residual mass inferolaterally in the right breast, likely surgical change. No  other hypermetabolic node or lesion.    06/22/2014 Surgery right mastectomy with negative margines and axillary node dissection. ypT0N0, complete patholigical resposne.     CURRENT THERAPY:  1.Trastuzumab 6 mg/kg every 3 weeks 2. Anastrozole 1 mg once daily  INTERVAL HISTORY: Madeline Davidson returns for follow-up. She is doing well overall. She reports sinus congestion in the past 3 weeks, with clear nasal discharge, no fever or chills. She says she has seasonal allergies sometimes. She has not taken zotac lately. No cough, chest pain, dyspnea. No mouth pain also throat.   She tolerates trastuzumab infusion and anastrozole very well without significant side effects, except mild joint discomfort. She has good energy level and  appetite, eats well. She has stable weight, no fever, rash, or other complaints.   REVIEW OF SYSTEMS:   Constitutional: Denies fevers, chills or abnormal weight loss Eyes: Denies blurriness of vision Ears, nose, mouth, throat, and face: Denies mucositis or sore throat Respiratory: Denies cough, dyspnea or wheezes Cardiovascular: Denies palpitation, chest discomfort or lower extremity swelling Gastrointestinal:  Denies nausea, heartburn or change in bowel habits Skin: Denies abnormal skin rashes Lymphatics: Denies new lymphadenopathy or easy bruising Neurological:Denies numbness, tingling or new weaknesses Behavioral/Psych: Mood is stable, no new changes  All other systems were reviewed with the patient and are negative.  MEDICAL HISTORY:  Past Medical History  Diagnosis Date  . Allergy   . Wears glasses   . 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 PONV (postoperative nausea and vomiting)   . Anxiety   . History of blood transfusion ~ 04/2014    "related to chemo"  . Anemia ~ 04/2014  . Arthritis     back    SURGICAL HISTORY: Past Surgical History  Procedure Laterality Date  . Colon resection  2004    "for abscess on my colon"  . Hernia repair  2005    umb  . Cholecystectomy  2005  . Portacath placement Left 01/31/2014    Procedure: INSERTION PORT-A-CATH;  Surgeon: Rolm Bookbinder, MD;  Location: [facility];  Service: General;  Laterality: Left;  . Axillary lymph node biopsy Right 12/2013  . Breast biopsy Right 12/2013  . Colon surgery    . Mastectomy  w/ sentinel node biopsy Right 06/22/2014    Procedure: RIGHT TOTAL MASTECTOMY WITH SENTINEL LYMPH NODE BIOPSY;  Surgeon: Rolm Bookbinder, MD;  Location: [facility];  Service: General;  Laterality: Right;    I have reviewed the social history and family history with the patient and they are unchanged from previous note.  ALLERGIES:  is allergic to ciprofloxacin.  MEDICATIONS:    Current Outpatient Prescriptions  Medication Sig Dispense Refill  . acetaminophen (TYLENOL) 500 MG tablet Take 1,000 mg by mouth every 6 (six) hours as needed for mild pain.    Marland Kitchen anastrozole (ARIMIDEX) 1 MG tablet Take 1 tablet (1 mg total) by mouth daily. 90 tablet 4  . cetirizine (ZYRTEC) 10 MG tablet Take 10 mg by mouth daily as needed for allergies.    . clonazePAM (KLONOPIN) 0.5 MG tablet Take 0.5 mg by mouth 2 (two) times daily as needed for anxiety.    Marland Kitchen escitalopram (LEXAPRO) 10 MG tablet Take 1 tablet (10 mg total) by mouth daily. 90 tablet 1  . ibuprofen (ADVIL,MOTRIN) 800 MG tablet Take 800 mg by mouth every 8 (eight) hours as needed for moderate pain.    Marland Kitchen lidocaine-prilocaine (EMLA) cream Apply 1 application topically as needed (for infusion site).    Marland Kitchen zolpidem (AMBIEN CR) 12.5 MG CR tablet Take 1 tablet (12.5 mg total) by mouth at bedtime as needed for sleep. 30 tablet 0   No current facility-administered medications for this visit.    PHYSICAL EXAMINATION: ECOG PERFORMANCE STATUS: 0 - Asymptomatic  Filed Vitals:   12/29/14 0857  BP: 134/87  Pulse: 86  Temp: 98.2 F (36.8 C)  Resp: 19   Filed Weights   12/29/14 0857  Weight: 191 lb 3.2 oz (86.728 kg)    GENERAL:alert, no distress and comfortable SKIN: skin color, texture, turgor are normal, no rashes or significant lesions EYES: normal, Conjunctiva are pink and non-injected, sclera clear OROPHARYNX:no exudate, no erythema and lips, buccal mucosa, and tongue normal  NECK: supple, thyroid normal size, non-tender, without nodularity LYMPH:  no palpable lymphadenopathy in the cervical, axillary or inguinal LUNGS: clear to auscultation and percussion with normal breathing effort HEART: regular rate & rhythm and no murmurs and no lower extremity edema ABDOMEN:abdomen soft, non-tender and normal bowel sounds Musculoskeletal:no cyanosis of digits and no clubbing  NEURO: alert & oriented x 3 with fluent speech, no  focal motor/sensory deficits Right breast surgically absent, no palpable lesions on chest wall or axilla note. Left breast exam no palpable mass, skin change or nipple large.  LABORATORY DATA:  I have reviewed the data as listed CBC Latest Ref Rng 12/15/2014 11/25/2014 11/04/2014  WBC 3.9 - 10.3 10e3/uL 6.5 7.0 8.0  Hemoglobin 11.6 - 15.9 g/dL 11.4(L) 11.8 11.1(L)  Hematocrit 34.8 - 46.6 % 34.9 37.0 33.9(L)  Platelets 145 - 400 10e3/uL 195 197 261     CMP Latest Ref Rng 12/15/2014 11/25/2014 11/04/2014  Glucose 70 - 140 mg/dl 107 111 87  BUN 7.0 - 26.0 mg/dL 19.9 18.2 20.8  Creatinine 0.6 - 1.1 mg/dL 0.8 0.9 0.8  Sodium 136 - 145 mEq/L 142 146(H) 144  Potassium 3.5 - 5.1 mEq/L 3.8 4.1 4.1  Chloride 96 - 112 mEq/L - - -  CO2 22 - 29 mEq/L $Remove'27 28 26  'aBHHFKy$ Calcium 8.4 - 10.4 mg/dL 9.5 10.1 10.0  Total Protein 6.4 - 8.3 g/dL 6.6 6.9 7.0  Total Bilirubin 0.20 - 1.20 mg/dL 0.28 0.39 0.31  Alkaline Phos 40 - 150 U/L  113 139 122  AST 5 - 34 U/L $Remo'16 16 18  'DmWms$ ALT 0 - 55 U/L $Remo'16 18 20      'PvIxX$ RADIOGRAPHIC STUDIES: I have personally reviewed the radiological images as listed and agreed with the findings in the report.  PET/CT on 12/27/2014 Single 7 mm right neck level IIa lymph node with hypermetabolic activity. This is likely reactive in the absence of other hypermetabolic cervical lymph nodes. No other hypermetabolic lymphadenopathy or definite signs of metastatic disease identified.  Stable focal hypermetabolic soft tissue prominence at the anal verge, which could be inflammatory or neoplastic in etiology. Correlation with physical exam findings is again suggested.   ASSESSMENT & PLAN:  59 year old postmenopausal woman, was found to have a 3.3 right breast mass and a positive axillary lymph nodes, biopsy confirmed invasive ductal carcinoma, ER positive PR positive HER-2 positive.  Her initial PET scan also reviewed 3 hypermetabolic nodes in periportal, measuring up to 2 cm with SUV 14. The  periportal node was not bopsied, but likely represented metastatic nodes. She had completed response to neoadjuvant chemotherapy and underwent right mastectomy.  1. Metastatic right breast cancer to periportal nodes, ER+/PR+/HER2+ -Her case was reviewed in our tumor board this week. Her initial and a post treatment PET scan was reviewed again. Due to the size and metabolic activity of those periportal lymph nodes at diagnosis, she likely has metastatic breast cancer. And the consensus was to continue therapy indefinitely. -Also She had excellent compete response to chemotherapy, She is at VERY high risk for cancer recurrence due to the nature of metastatic disease. We recommend her to continue maintenance Herceptin and anastrozole indefinitely. She agrees with the plan.  -I reviewed her restaging PET scan today in person, the right cervical level II lymph nodes is very small, not palpable, likely reactive to her recent sinusitis.  -We'll monitor her disease status every 3-4 months by PET scan. -Continue monitor her ECHO every 3 months when she is on Herceptin therapy, next due on 01/14/2015. I ordered today. -We discussed the risk of osteo-process when she is on anastrozole, she will continue calcium and vitamin D supplements. -I suggest her to take Tylenol and ibuprofen alternatively for her arthralgia. -I encouraged her to continue exercise and remain to be active.  2. Hypermetabolic right cervical level II lymph node -This is likely related to her recent sinusitis, less likely metastatic lesion, giving no cervical or other adenopathy on PET scan. -I suggest her to take so zytec to see if her sinus symptoms and improves.   3. Anxiety and depression -She is on low-dose clonazepam, for trouble with sleeping and anxiety. Doing well overall.  -She tried zoloft and lexapro but could not tolerated.   4. Anemia, likely secondary to chemotherapy. -Improved  Follow-up: 6-7 weeks.   All questions  were answered. The patient knows to call the clinic with any problems, questions or concerns. No barriers to learning was detected.  I spent 30 minutes counseling the patient face to face. The total time spent in the appointment was 40 minutes and more than 50% was on counseling and review of test results   Truitt Merle, MD 12/29/2014                Orders Placed This Encounter  Procedures  . 2D Echocardiogram without contrast    Standing Status: Future     Number of Occurrences:      Standing Expiration Date: 12/29/2015    Scheduling Instructions:  PROVIDERS If this is NOT for EF then please REASON that  needs COMPLETE STUDY    Order Specific Question:  Type of Echo    Answer:  Complete    Order Specific Question:  Reason for Exam    Answer:  pt on herceptin, monitoring    Order Specific Question:  Where should this test be performed    Answer:  Malachi Pro, MD 12/29/2014 9:28 PM

## 2015-01-06 ENCOUNTER — Ambulatory Visit (HOSPITAL_BASED_OUTPATIENT_CLINIC_OR_DEPARTMENT_OTHER): Payer: BLUE CROSS/BLUE SHIELD

## 2015-01-06 ENCOUNTER — Other Ambulatory Visit (HOSPITAL_BASED_OUTPATIENT_CLINIC_OR_DEPARTMENT_OTHER): Payer: BLUE CROSS/BLUE SHIELD

## 2015-01-06 DIAGNOSIS — C50411 Malignant neoplasm of upper-outer quadrant of right female breast: Secondary | ICD-10-CM

## 2015-01-06 DIAGNOSIS — C50911 Malignant neoplasm of unspecified site of right female breast: Secondary | ICD-10-CM

## 2015-01-06 DIAGNOSIS — Z5112 Encounter for antineoplastic immunotherapy: Secondary | ICD-10-CM

## 2015-01-06 LAB — CBC WITH DIFFERENTIAL/PLATELET
BASO%: 0.3 % (ref 0.0–2.0)
Basophils Absolute: 0 10*3/uL (ref 0.0–0.1)
EOS%: 2.4 % (ref 0.0–7.0)
Eosinophils Absolute: 0.2 10*3/uL (ref 0.0–0.5)
HCT: 36.6 % (ref 34.8–46.6)
HEMOGLOBIN: 11.8 g/dL (ref 11.6–15.9)
LYMPH%: 16.3 % (ref 14.0–49.7)
MCH: 28.4 pg (ref 25.1–34.0)
MCHC: 32.2 g/dL (ref 31.5–36.0)
MCV: 88 fL (ref 79.5–101.0)
MONO#: 0.3 10*3/uL (ref 0.1–0.9)
MONO%: 4.2 % (ref 0.0–14.0)
NEUT#: 4.8 10*3/uL (ref 1.5–6.5)
NEUT%: 76.8 % (ref 38.4–76.8)
Platelets: 190 10*3/uL (ref 145–400)
RBC: 4.16 10*6/uL (ref 3.70–5.45)
RDW: 13.8 % (ref 11.2–14.5)
WBC: 6.2 10*3/uL (ref 3.9–10.3)
lymph#: 1 10*3/uL (ref 0.9–3.3)

## 2015-01-06 LAB — COMPREHENSIVE METABOLIC PANEL (CC13)
ALK PHOS: 118 U/L (ref 40–150)
ALT: 15 U/L (ref 0–55)
AST: 15 U/L (ref 5–34)
Albumin: 3.6 g/dL (ref 3.5–5.0)
Anion Gap: 10 mEq/L (ref 3–11)
BUN: 17 mg/dL (ref 7.0–26.0)
CALCIUM: 9.4 mg/dL (ref 8.4–10.4)
CO2: 28 mEq/L (ref 22–29)
CREATININE: 0.8 mg/dL (ref 0.6–1.1)
Chloride: 106 mEq/L (ref 98–109)
EGFR: 79 mL/min/{1.73_m2} — ABNORMAL LOW (ref >90)
Glucose: 111 mg/dl (ref 70–140)
Potassium: 3.8 mEq/L (ref 3.5–5.1)
Sodium: 143 mEq/L (ref 136–145)
Total Bilirubin: 0.33 mg/dL (ref 0.20–1.20)
Total Protein: 6.6 g/dL (ref 6.4–8.3)

## 2015-01-06 MED ORDER — TRASTUZUMAB CHEMO INJECTION 440 MG
6.0000 mg/kg | Freq: Once | INTRAVENOUS | Status: AC
  Administered 2015-01-06: 525 mg via INTRAVENOUS
  Filled 2015-01-06: qty 25

## 2015-01-06 MED ORDER — SODIUM CHLORIDE 0.9 % IV SOLN
Freq: Once | INTRAVENOUS | Status: AC
  Administered 2015-01-06: 10:00:00 via INTRAVENOUS

## 2015-01-06 MED ORDER — DIPHENHYDRAMINE HCL 25 MG PO CAPS
ORAL_CAPSULE | ORAL | Status: AC
  Filled 2015-01-06: qty 1

## 2015-01-06 MED ORDER — HEPARIN SOD (PORK) LOCK FLUSH 100 UNIT/ML IV SOLN
500.0000 [IU] | Freq: Once | INTRAVENOUS | Status: AC | PRN
  Administered 2015-01-06: 500 [IU]
  Filled 2015-01-06: qty 5

## 2015-01-06 MED ORDER — DIPHENHYDRAMINE HCL 25 MG PO CAPS
25.0000 mg | ORAL_CAPSULE | Freq: Once | ORAL | Status: AC
  Administered 2015-01-06: 25 mg via ORAL

## 2015-01-06 MED ORDER — ACETAMINOPHEN 325 MG PO TABS
650.0000 mg | ORAL_TABLET | Freq: Once | ORAL | Status: AC
  Administered 2015-01-06: 650 mg via ORAL

## 2015-01-06 MED ORDER — SODIUM CHLORIDE 0.9 % IJ SOLN
10.0000 mL | INTRAMUSCULAR | Status: DC | PRN
  Administered 2015-01-06: 10 mL
  Filled 2015-01-06: qty 10

## 2015-01-06 MED ORDER — ACETAMINOPHEN 325 MG PO TABS
ORAL_TABLET | ORAL | Status: AC
  Filled 2015-01-06: qty 2

## 2015-01-06 NOTE — Patient Instructions (Signed)

## 2015-01-26 ENCOUNTER — Other Ambulatory Visit: Payer: Self-pay | Admitting: *Deleted

## 2015-01-26 ENCOUNTER — Telehealth: Payer: Self-pay | Admitting: *Deleted

## 2015-01-26 DIAGNOSIS — C50411 Malignant neoplasm of upper-outer quadrant of right female breast: Secondary | ICD-10-CM

## 2015-01-26 NOTE — Telephone Encounter (Signed)
Spoke with pt today and informed pt of appts tomorrow re:  Pt has appt for Echocardiogram at 10 am 01/27/15 after visit with Dr. Burr Medico.  Pt's chemo appt has been changed to 1145 after echo test.  Pt voiced understanding.

## 2015-01-27 ENCOUNTER — Ambulatory Visit (HOSPITAL_BASED_OUTPATIENT_CLINIC_OR_DEPARTMENT_OTHER): Payer: BLUE CROSS/BLUE SHIELD

## 2015-01-27 ENCOUNTER — Other Ambulatory Visit: Payer: BLUE CROSS/BLUE SHIELD

## 2015-01-27 ENCOUNTER — Encounter: Payer: Self-pay | Admitting: Hematology

## 2015-01-27 ENCOUNTER — Ambulatory Visit (HOSPITAL_COMMUNITY)
Admission: RE | Admit: 2015-01-27 | Discharge: 2015-01-27 | Disposition: A | Discharge status: Home / Self Care | Payer: BLUE CROSS/BLUE SHIELD | Source: Ambulatory Visit | Attending: Hematology | Admitting: Hematology

## 2015-01-27 ENCOUNTER — Telehealth: Payer: Self-pay | Admitting: *Deleted

## 2015-01-27 ENCOUNTER — Other Ambulatory Visit (HOSPITAL_BASED_OUTPATIENT_CLINIC_OR_DEPARTMENT_OTHER): Payer: BLUE CROSS/BLUE SHIELD

## 2015-01-27 ENCOUNTER — Ambulatory Visit (HOSPITAL_BASED_OUTPATIENT_CLINIC_OR_DEPARTMENT_OTHER): Payer: BLUE CROSS/BLUE SHIELD | Admitting: Hematology

## 2015-01-27 VITALS — BP 146/76 | HR 85 | Temp 98.7°F | Resp 19 | Ht 64.0 in | Wt 189.6 lb

## 2015-01-27 DIAGNOSIS — C773 Secondary and unspecified malignant neoplasm of axilla and upper limb lymph nodes: Secondary | ICD-10-CM

## 2015-01-27 DIAGNOSIS — C50411 Malignant neoplasm of upper-outer quadrant of right female breast: Secondary | ICD-10-CM

## 2015-01-27 DIAGNOSIS — Z5112 Encounter for antineoplastic immunotherapy: Secondary | ICD-10-CM

## 2015-01-27 DIAGNOSIS — G4700 Insomnia, unspecified: Secondary | ICD-10-CM

## 2015-01-27 DIAGNOSIS — C50419 Malignant neoplasm of upper-outer quadrant of unspecified female breast: Secondary | ICD-10-CM

## 2015-01-27 DIAGNOSIS — D6481 Anemia due to antineoplastic chemotherapy: Secondary | ICD-10-CM

## 2015-01-27 DIAGNOSIS — I10 Essential (primary) hypertension: Secondary | ICD-10-CM | POA: Diagnosis present

## 2015-01-27 DIAGNOSIS — Z08 Encounter for follow-up examination after completed treatment for malignant neoplasm: Secondary | ICD-10-CM

## 2015-01-27 DIAGNOSIS — Z170 Estrogen receptor positive status [ER+]: Secondary | ICD-10-CM

## 2015-01-27 LAB — CBC WITH DIFFERENTIAL/PLATELET
BASO%: 0.3 % (ref 0.0–2.0)
Basophils Absolute: 0 10*3/uL (ref 0.0–0.1)
EOS%: 2.2 % (ref 0.0–7.0)
Eosinophils Absolute: 0.1 10*3/uL (ref 0.0–0.5)
HCT: 39.1 % (ref 34.8–46.6)
HEMOGLOBIN: 12.6 g/dL (ref 11.6–15.9)
LYMPH%: 17.1 % (ref 14.0–49.7)
MCH: 28.6 pg (ref 25.1–34.0)
MCHC: 32.2 g/dL (ref 31.5–36.0)
MCV: 88.7 fL (ref 79.5–101.0)
MONO#: 0.3 10*3/uL (ref 0.1–0.9)
MONO%: 5.3 % (ref 0.0–14.0)
NEUT%: 75.1 % (ref 38.4–76.8)
NEUTROS ABS: 4.8 10*3/uL (ref 1.5–6.5)
Platelets: 211 10*3/uL (ref 145–400)
RBC: 4.41 10*6/uL (ref 3.70–5.45)
RDW: 13.6 % (ref 11.2–14.5)
WBC: 6.4 10*3/uL (ref 3.9–10.3)
lymph#: 1.1 10*3/uL (ref 0.9–3.3)

## 2015-01-27 LAB — COMPREHENSIVE METABOLIC PANEL (CC13)
ALBUMIN: 3.7 g/dL (ref 3.5–5.0)
ALT: 14 U/L (ref 0–55)
AST: 14 U/L (ref 5–34)
Alkaline Phosphatase: 137 U/L (ref 40–150)
Anion Gap: 11 mEq/L (ref 3–11)
BUN: 20.1 mg/dL (ref 7.0–26.0)
CALCIUM: 9.7 mg/dL (ref 8.4–10.4)
CO2: 26 meq/L (ref 22–29)
CREATININE: 0.9 mg/dL (ref 0.6–1.1)
Chloride: 109 mEq/L (ref 98–109)
EGFR: 75 mL/min/{1.73_m2} — AB (ref >90)
Glucose: 117 mg/dl (ref 70–140)
Potassium: 4 mEq/L (ref 3.5–5.1)
SODIUM: 146 meq/L — AB (ref 136–145)
Total Bilirubin: 0.29 mg/dL (ref 0.20–1.20)
Total Protein: 6.9 g/dL (ref 6.4–8.3)

## 2015-01-27 MED ORDER — HEPARIN SOD (PORK) LOCK FLUSH 100 UNIT/ML IV SOLN
500.0000 [IU] | Freq: Once | INTRAVENOUS | Status: AC | PRN
  Administered 2015-01-27: 500 [IU]
  Filled 2015-01-27: qty 5

## 2015-01-27 MED ORDER — ACETAMINOPHEN 325 MG PO TABS
650.0000 mg | ORAL_TABLET | Freq: Once | ORAL | Status: AC
  Administered 2015-01-27: 650 mg via ORAL

## 2015-01-27 MED ORDER — DIPHENHYDRAMINE HCL 25 MG PO CAPS
ORAL_CAPSULE | ORAL | Status: AC
  Filled 2015-01-27: qty 1

## 2015-01-27 MED ORDER — SODIUM CHLORIDE 0.9 % IV SOLN
Freq: Once | INTRAVENOUS | Status: AC
  Administered 2015-01-27: 11:00:00 via INTRAVENOUS

## 2015-01-27 MED ORDER — ZOLPIDEM TARTRATE ER 12.5 MG PO TBCR: 12.5000 mg | EXTENDED_RELEASE_TABLET | Freq: Every evening | ORAL | Status: DC | PRN

## 2015-01-27 MED ORDER — SODIUM CHLORIDE 0.9 % IJ SOLN
10.0000 mL | INTRAMUSCULAR | Status: DC | PRN
  Administered 2015-01-27: 10 mL
  Filled 2015-01-27: qty 10

## 2015-01-27 MED ORDER — DIPHENHYDRAMINE HCL 25 MG PO CAPS
25.0000 mg | ORAL_CAPSULE | Freq: Once | ORAL | Status: AC
  Administered 2015-01-27: 25 mg via ORAL

## 2015-01-27 MED ORDER — TRASTUZUMAB CHEMO INJECTION 440 MG
6.0000 mg/kg | Freq: Once | INTRAVENOUS | Status: AC
  Administered 2015-01-27: 525 mg via INTRAVENOUS
  Filled 2015-01-27: qty 25

## 2015-01-27 MED ORDER — ACETAMINOPHEN 325 MG PO TABS
ORAL_TABLET | ORAL | Status: AC
Start: 2015-01-27 — End: 2015-01-27
  Filled 2015-01-27: qty 2

## 2015-01-27 NOTE — Telephone Encounter (Signed)
Per staff message and POF I have scheduled appts. Advised scheduler of appts. JMW  

## 2015-01-27 NOTE — Patient Instructions (Signed)
[facility] Discharge Instructions for Patients Receiving Chemotherapy  Today you received the following chemotherapy agents Herceptin.  To help prevent nausea and vomiting after your treatment, we encourage you to take your nausea medication as directed.   If you develop nausea and vomiting that is not controlled by your nausea medication, call the clinic.   BELOW ARE SYMPTOMS THAT SHOULD BE REPORTED IMMEDIATELY:  *FEVER GREATER THAN 100.5 F  *CHILLS WITH OR WITHOUT FEVER  NAUSEA AND VOMITING THAT IS NOT CONTROLLED WITH YOUR NAUSEA MEDICATION  *UNUSUAL SHORTNESS OF BREATH  *UNUSUAL BRUISING OR BLEEDING  TENDERNESS IN MOUTH AND THROAT WITH OR WITHOUT PRESENCE OF ULCERS  *URINARY PROBLEMS  *BOWEL PROBLEMS  UNUSUAL RASH Items with * indicate a potential emergency and should be followed up as soon as possible.  Feel free to call the clinic you have any questions or concerns. The clinic phone number is [phone].  

## 2015-01-27 NOTE — Progress Notes (Signed)
*  PRELIMINARY RESULTS* Echocardiogram 2D Echocardiogram has been performed.  Leavy Cella 01/27/2015, 10:33 AM

## 2015-01-27 NOTE — Progress Notes (Signed)
[place]  Telephone:(336) [phone] [phone]) [phone]  Clinic Follow up Note   Patient Care Team: [name], MD as PCP - General (Nurse Practitioner) 01/27/2015  SUMMARY OF ONCOLOGIC HISTORY: Breast cancer of upper-outer quadrant of right female breast   Staging form: Breast, AJCC 7th Edition     Clinical: Stage IV (T2, N1, M1) - Signed by Truitt Merle, MD on 12/29/2014       Prognostic indicators: ER/PR/HER2NEU +      Pathologic: No stage assigned - Unsigned       Prognostic indicators: ER/PR/HER2NEU +      Breast cancer of upper-outer quadrant of right female breast   01/03/2014 Initial Diagnosis Breast cancer of upper-outer quadrant of right female breast, IDA, grade 2, ER 90%+, PR 1%+, her2 (+)   01/20/2014 PET scan Hypermetabolic mass in the posterior right breast (primary), 2 additional nodules in deep right breast (likely nodes) and 3 hyermetabolic portal nodes (lasrgest 2cm, SUV 13).    02/03/2014 - 05/20/2014 Neo-Adjuvant Chemotherapy docetaxel, Taxotere, Carboplatin, Herceptin, Perjeta for 6 cycles    06/01/2014 PET scan Minimal residual hypermetabolic activity within the residual mass inferolaterally in the right breast, likely surgical change. No  other hypermetabolic node or lesion.    06/22/2014 Surgery right mastectomy with negative margines and axillary node dissection. ypT0N0, complete patholigical resposne.     CURRENT THERAPY:  1.Trastuzumab 6 mg/kg every 3 weeks 2. Anastrozole 1 mg once daily  INTERVAL HISTORY: Madeline Davidson returns for follow-up. She is doing well overall. She tolerates trastuzumab infusion and anastrozole very well without significant side effects, except mild joint discomfort. No skin rash, no dyspnea, or other complains. She has good energy level and appetite, eats well. She has stable weight, works full time.    REVIEW OF SYSTEMS:   Constitutional: Denies fevers, chills or abnormal weight loss Eyes: Denies blurriness of vision Ears,  nose, mouth, throat, and face: Denies mucositis or sore throat Respiratory: Denies cough, dyspnea or wheezes Cardiovascular: Denies palpitation, chest discomfort or lower extremity swelling Gastrointestinal:  Denies nausea, heartburn or change in bowel habits Skin: Denies abnormal skin rashes Lymphatics: Denies new lymphadenopathy or easy bruising Neurological:Denies numbness, tingling or new weaknesses Behavioral/Psych: Mood is stable, no new changes  All other systems were reviewed with the patient and are negative.  MEDICAL HISTORY:  Past Medical History  Diagnosis Date  . Allergy   . Wears glasses   . 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 PONV (postoperative nausea and vomiting)   . Anxiety   . History of blood transfusion ~ 04/2014    "related to chemo"  . Anemia ~ 04/2014  . Arthritis     back    SURGICAL HISTORY: Past Surgical History  Procedure Laterality Date  . Colon resection  2004    "for abscess on my colon"  . Hernia repair  2005    umb  . Cholecystectomy  2005  . Portacath placement Left 01/31/2014    Procedure: INSERTION PORT-A-CATH;  Surgeon: Rolm Bookbinder, MD;  Location: [facility];  Service: General;  Laterality: Left;  . Axillary lymph node biopsy Right 12/2013  . Breast biopsy Right 12/2013  . Colon surgery    . Mastectomy w/ sentinel node biopsy Right 06/22/2014    Procedure: RIGHT TOTAL MASTECTOMY WITH SENTINEL LYMPH NODE BIOPSY;  Surgeon: Rolm Bookbinder, MD;  Location: [facility];  Service: General;  Laterality: Right;  I have reviewed the social history and family history with the patient and they are unchanged from previous note.  ALLERGIES:  is allergic to ciprofloxacin.  MEDICATIONS:  Current Outpatient Prescriptions  Medication Sig Dispense Refill  . acetaminophen (TYLENOL) 500 MG tablet Take 1,000 mg by mouth every 6 (six) hours as needed for mild pain.    Marland Kitchen anastrozole (ARIMIDEX) 1 MG tablet  Take 1 tablet (1 mg total) by mouth daily. 90 tablet 4  . cetirizine (ZYRTEC) 10 MG tablet Take 10 mg by mouth daily as needed for allergies.    . clonazePAM (KLONOPIN) 0.5 MG tablet Take 0.5 mg by mouth 2 (two) times daily as needed for anxiety.    Marland Kitchen ibuprofen (ADVIL,MOTRIN) 800 MG tablet Take 800 mg by mouth every 8 (eight) hours as needed for moderate pain.    Marland Kitchen lidocaine-prilocaine (EMLA) cream Apply 1 application topically as needed (for infusion site).    Marland Kitchen zolpidem (AMBIEN CR) 12.5 MG CR tablet Take 1 tablet (12.5 mg total) by mouth at bedtime as needed for sleep. 30 tablet 0   No current facility-administered medications for this visit.    PHYSICAL EXAMINATION: ECOG PERFORMANCE STATUS: 0 - Asymptomatic  Filed Vitals:   01/27/15 0836  BP: 146/76  Pulse: 85  Temp: 98.7 F (37.1 C)  Resp: 19   Filed Weights   01/27/15 0836  Weight: 189 lb 9.6 oz (86.002 kg)    GENERAL:alert, no distress and comfortable SKIN: skin color, texture, turgor are normal, no rashes or significant lesions EYES: normal, Conjunctiva are pink and non-injected, sclera clear OROPHARYNX:no exudate, no erythema and lips, buccal mucosa, and tongue normal  NECK: supple, thyroid normal size, non-tender, without nodularity LYMPH:  no palpable lymphadenopathy in the cervical, axillary or inguinal LUNGS: clear to auscultation and percussion with normal breathing effort HEART: regular rate & rhythm and no murmurs and no lower extremity edema ABDOMEN:abdomen soft, non-tender and normal bowel sounds Musculoskeletal:no cyanosis of digits and no clubbing  NEURO: alert & oriented x 3 with fluent speech, no focal motor/sensory deficits Right breast surgically absent, there is a small area of skin erythema without discharge at the skin folder of the surgical scar, no palpable lesions on chest wall or axilla note. Left breast exam no palpable mass, skin change or nipple large.  LABORATORY DATA:  I have reviewed the  data as listed CBC Latest Ref Rng 01/27/2015 01/06/2015 12/15/2014  WBC 3.9 - 10.3 10e3/uL 6.4 6.2 6.5  Hemoglobin 11.6 - 15.9 g/dL 12.6 11.8 11.4(L)  Hematocrit 34.8 - 46.6 % 39.1 36.6 34.9  Platelets 145 - 400 10e3/uL 211 190 195     CMP Latest Ref Rng 01/27/2015 01/06/2015 12/15/2014  Glucose 70 - 140 mg/dl 117 111 107  BUN 7.0 - 26.0 mg/dL 20.1 17.0 19.9  Creatinine 0.6 - 1.1 mg/dL 0.9 0.8 0.8  Sodium 136 - 145 mEq/L 146(H) 143 142  Potassium 3.5 - 5.1 mEq/L 4.0 3.8 3.8  Chloride 96 - 112 mEq/L - - -  CO2 22 - 29 mEq/L $Remove'26 28 27  'laoLEFC$ Calcium 8.4 - 10.4 mg/dL 9.7 9.4 9.5  Total Protein 6.4 - 8.3 g/dL 6.9 6.6 6.6  Total Bilirubin 0.20 - 1.20 mg/dL 0.29 0.33 0.28  Alkaline Phos 40 - 150 U/L 137 118 113  AST 5 - 34 U/L $Remo'14 15 16  'mSzJo$ ALT 0 - 55 U/L $Remo'14 15 16      'hVbFK$ RADIOGRAPHIC STUDIES: I have personally reviewed the radiological images as listed and  agreed with the findings in the report.  PET/CT on 12/27/2014 Single 7 mm right neck level IIa lymph node with hypermetabolic activity. This is likely reactive in the absence of other hypermetabolic cervical lymph nodes. No other hypermetabolic lymphadenopathy or definite signs of metastatic disease identified.  Stable focal hypermetabolic soft tissue prominence at the anal verge, which could be inflammatory or neoplastic in etiology. Correlation with physical exam findings is again suggested.  ASSESSMENT AND PLAN  59 year old postmenopausal woman, was found to have a 3.3 right breast mass and a positive axillary lymph nodes, biopsy confirmed invasive ductal carcinoma, ER positive PR positive HER-2 positive.  Her initial PET scan also reviewed 3 hypermetabolic nodes in periportal, measuring up to 2 cm with SUV 14. The periportal node was not bopsied, but likely represented metastatic nodes. She had completed response to neoadjuvant chemotherapy and underwent right mastectomy.  1. Metastatic right breast cancer to periportal nodes,  ER+/PR+/HER2+ -Her case was reviewed in our tumor board this week. Her initial and a post treatment PET scan was reviewed again. Due to the size and metabolic activity of those periportal lymph nodes at diagnosis, she likely has metastatic breast cancer. And the consensus was to continue therapy indefinitely. -Also She had excellent compete response to chemotherapy, She is at VERY high risk for cancer recurrence due to the nature of metastatic disease. We recommend her to continue maintenance Herceptin and anastrozole indefinitely. She agrees with the plan.  -I reviewed her restaging PET scan today in person, the right cervical level II lymph nodes is very small, not palpable, likely reactive to her recent sinusitis.  -We'll monitor her disease status every 3-4 months by PET scan. -Continue monitor her ECHO every 3 months when she is on Herceptin therapy, she is scheduled for today before treatment.  -We discussed the risk of osteoporocess when she is on anastrozole, she will continue calcium and vitamin D supplements. -I suggest her to take Tylenol and ibuprofen alternatively for her arthralgia. -I encouraged her to continue exercise and remain to be active.  2. Hypermetabolic right cervical level II lymph node -This is likely related to her recent sinusitis, less likely metastatic lesion, giving no cervical or other adenopathy on PET scan. -I suggest her to take so zytec to see if her sinus symptoms and improves.   3. Anxiety and depression -She is on low-dose clonazepam, for trouble with sleeping and anxiety. Doing well overall.  -She tried zoloft and lexapro but could not tolerated.   4. Anemia, likely secondary to chemotherapy. -Improved  Follow-up: 3 weeks.   All questions were answered. The patient knows to call the clinic with any problems, questions or concerns. No barriers to learning was detected.  I spent 15 minutes counseling the patient face to face. The total time spent in  the appointment was 20 minutes and more than 50% was on counseling and review of test results   Truitt Merle, MD 01/27/2015

## 2015-02-17 ENCOUNTER — Telehealth: Payer: Self-pay | Admitting: Hematology

## 2015-02-17 ENCOUNTER — Ambulatory Visit (HOSPITAL_BASED_OUTPATIENT_CLINIC_OR_DEPARTMENT_OTHER): Payer: BLUE CROSS/BLUE SHIELD | Admitting: Hematology

## 2015-02-17 ENCOUNTER — Ambulatory Visit (HOSPITAL_BASED_OUTPATIENT_CLINIC_OR_DEPARTMENT_OTHER): Payer: BLUE CROSS/BLUE SHIELD

## 2015-02-17 ENCOUNTER — Other Ambulatory Visit (HOSPITAL_BASED_OUTPATIENT_CLINIC_OR_DEPARTMENT_OTHER): Payer: BLUE CROSS/BLUE SHIELD

## 2015-02-17 ENCOUNTER — Encounter: Payer: Self-pay | Admitting: Hematology

## 2015-02-17 ENCOUNTER — Other Ambulatory Visit: Payer: Self-pay | Admitting: *Deleted

## 2015-02-17 VITALS — BP 152/73 | HR 85 | Temp 98.6°F | Resp 19 | Ht 64.0 in | Wt 189.0 lb

## 2015-02-17 DIAGNOSIS — D649 Anemia, unspecified: Secondary | ICD-10-CM

## 2015-02-17 DIAGNOSIS — C50411 Malignant neoplasm of upper-outer quadrant of right female breast: Secondary | ICD-10-CM

## 2015-02-17 DIAGNOSIS — F419 Anxiety disorder, unspecified: Secondary | ICD-10-CM

## 2015-02-17 DIAGNOSIS — C773 Secondary and unspecified malignant neoplasm of axilla and upper limb lymph nodes: Secondary | ICD-10-CM

## 2015-02-17 DIAGNOSIS — R591 Generalized enlarged lymph nodes: Secondary | ICD-10-CM

## 2015-02-17 DIAGNOSIS — Z5112 Encounter for antineoplastic immunotherapy: Secondary | ICD-10-CM

## 2015-02-17 DIAGNOSIS — M8580 Other specified disorders of bone density and structure, unspecified site: Secondary | ICD-10-CM

## 2015-02-17 LAB — COMPREHENSIVE METABOLIC PANEL (CC13)
ALBUMIN: 3.8 g/dL (ref 3.5–5.0)
ALK PHOS: 143 U/L (ref 40–150)
ALT: 12 U/L (ref 0–55)
AST: 14 U/L (ref 5–34)
Anion Gap: 12 mEq/L — ABNORMAL HIGH (ref 3–11)
BUN: 19 mg/dL (ref 7.0–26.0)
CO2: 26 mEq/L (ref 22–29)
Calcium: 9.9 mg/dL (ref 8.4–10.4)
Chloride: 107 mEq/L (ref 98–109)
Creatinine: 0.8 mg/dL (ref 0.6–1.1)
EGFR: 77 mL/min/{1.73_m2} — ABNORMAL LOW (ref >90)
Glucose: 96 mg/dl (ref 70–140)
POTASSIUM: 3.7 meq/L (ref 3.5–5.1)
Sodium: 146 mEq/L — ABNORMAL HIGH (ref 136–145)
TOTAL PROTEIN: 7 g/dL (ref 6.4–8.3)
Total Bilirubin: 0.41 mg/dL (ref 0.20–1.20)

## 2015-02-17 LAB — CBC WITH DIFFERENTIAL/PLATELET
BASO%: 0.9 % (ref 0.0–2.0)
BASOS ABS: 0 10*3/uL (ref 0.0–0.1)
EOS%: 2.3 % (ref 0.0–7.0)
Eosinophils Absolute: 0.1 10*3/uL (ref 0.0–0.5)
HCT: 39.7 % (ref 34.8–46.6)
HGB: 12.7 g/dL (ref 11.6–15.9)
LYMPH#: 1.2 10*3/uL (ref 0.9–3.3)
LYMPH%: 20.9 % (ref 14.0–49.7)
MCH: 27.6 pg (ref 25.1–34.0)
MCHC: 32.1 g/dL (ref 31.5–36.0)
MCV: 86.1 fL (ref 79.5–101.0)
MONO#: 0.3 10*3/uL (ref 0.1–0.9)
MONO%: 5.8 % (ref 0.0–14.0)
NEUT#: 4 10*3/uL (ref 1.5–6.5)
NEUT%: 70.1 % (ref 38.4–76.8)
Platelets: 231 10*3/uL (ref 145–400)
RBC: 4.61 10*6/uL (ref 3.70–5.45)
RDW: 14 % (ref 11.2–14.5)
WBC: 5.7 10*3/uL (ref 3.9–10.3)

## 2015-02-17 MED ORDER — ACETAMINOPHEN 325 MG PO TABS
650.0000 mg | ORAL_TABLET | Freq: Once | ORAL | Status: AC
  Administered 2015-02-17: 650 mg via ORAL

## 2015-02-17 MED ORDER — HEPARIN SOD (PORK) LOCK FLUSH 100 UNIT/ML IV SOLN
500.0000 [IU] | Freq: Once | INTRAVENOUS | Status: AC | PRN
  Administered 2015-02-17: 500 [IU]
  Filled 2015-02-17: qty 5

## 2015-02-17 MED ORDER — DIPHENHYDRAMINE HCL 25 MG PO CAPS
25.0000 mg | ORAL_CAPSULE | Freq: Once | ORAL | Status: AC
  Administered 2015-02-17: 25 mg via ORAL

## 2015-02-17 MED ORDER — CLONAZEPAM 0.5 MG PO TABS: 0.5000 mg | ORAL_TABLET | Freq: Two times a day (BID) | ORAL | Status: DC | PRN

## 2015-02-17 MED ORDER — ACETAMINOPHEN 325 MG PO TABS
ORAL_TABLET | ORAL | Status: AC
  Filled 2015-02-17: qty 2

## 2015-02-17 MED ORDER — SODIUM CHLORIDE 0.9 % IV SOLN
6.0000 mg/kg | Freq: Once | INTRAVENOUS | Status: AC
  Administered 2015-02-17: 525 mg via INTRAVENOUS
  Filled 2015-02-17: qty 25

## 2015-02-17 MED ORDER — SODIUM CHLORIDE 0.9 % IV SOLN
Freq: Once | INTRAVENOUS | Status: AC
  Administered 2015-02-17: 10:00:00 via INTRAVENOUS

## 2015-02-17 MED ORDER — SODIUM CHLORIDE 0.9 % IJ SOLN
10.0000 mL | INTRAMUSCULAR | Status: DC | PRN
  Administered 2015-02-17: 10 mL
  Filled 2015-02-17: qty 10

## 2015-02-17 MED ORDER — DIPHENHYDRAMINE HCL 25 MG PO CAPS
ORAL_CAPSULE | ORAL | Status: AC
  Filled 2015-02-17: qty 1

## 2015-02-17 NOTE — Telephone Encounter (Signed)
Pt confirmed labs/ov per 02/26 POF, gave pt AVS.... KJ, sent msg to MD to change diagnosis on MM and Bone Density Scan

## 2015-02-17 NOTE — Addendum Note (Signed)
Addended by: Truitt Merle on: 02/17/2015 11:52 AM   Modules accepted: Orders

## 2015-02-17 NOTE — Progress Notes (Signed)
[facility]  Telephone:(336) [phone] Fax:(336) [phone]  Clinic Follow up Note   Patient Care Team: Donnita Falls, MD as PCP - General (Nurse Practitioner) 02/17/2015  SUMMARY OF ONCOLOGIC HISTORY: Breast cancer of upper-outer quadrant of right female breast   Staging form: Breast, AJCC 7th Edition     Clinical: Stage IV (T2, N1, M1) - Signed by Malachy Mood, MD on 12/29/2014       Prognostic indicators: ER/PR/HER2NEU +      Pathologic: No stage assigned - Unsigned       Prognostic indicators: ER/PR/HER2NEU +      Breast cancer of upper-outer quadrant of right female breast   01/03/2014 Initial Diagnosis Breast cancer of upper-outer quadrant of right female breast, IDA, grade 2, ER 90%+, PR 1%+, her2 (+)   01/20/2014 PET scan Hypermetabolic mass in the posterior right breast (primary), 2 additional nodules in deep right breast (likely nodes) and 3 hyermetabolic portal nodes (lasrgest 2cm, SUV 13).    02/03/2014 - 05/20/2014 Neo-Adjuvant Chemotherapy docetaxel, Taxotere, Carboplatin, Herceptin, Perjeta for 6 cycles    06/01/2014 PET scan Minimal residual hypermetabolic activity within the residual mass inferolaterally in the right breast, likely surgical change. No  other hypermetabolic node or lesion.    06/22/2014 Surgery right mastectomy with negative margines and axillary node dissection. ypT0N0, complete patholigical resposne.     CURRENT THERAPY:  1.Trastuzumab 6 mg/kg every 3 weeks indefinitely 2. Anastrozole 1 mg once daily  INTERVAL HISTORY: Madeline Davidson returns for follow-up. She is doing well overall. She tolerates trastuzumab infusion and anastrozole very well without significant side effects, except mild joint discomfort, especially enhanced after day of work. She has some stiffness also.. No skin rash, no dyspnea, or other complains. She has good energy level and appetite, eats well. She has stable weight, works full time. She has chronic numbness and mild discomfort at  the right mastectomy surgical site, which has not changed.   REVIEW OF SYSTEMS:   Constitutional: Denies fevers, chills or abnormal weight loss Eyes: Denies blurriness of vision Ears, nose, mouth, throat, and face: Denies mucositis or sore throat Respiratory: Denies cough, dyspnea or wheezes Cardiovascular: Denies palpitation, chest discomfort or lower extremity swelling Gastrointestinal:  Denies nausea, heartburn or change in bowel habits Skin: Denies abnormal skin rashes Lymphatics: Denies new lymphadenopathy or easy bruising Neurological:Denies numbness, tingling or new weaknesses Behavioral/Psych: Mood is stable, no new changes  All other systems were reviewed with the patient and are negative.  MEDICAL HISTORY:  Past Medical History  Diagnosis Date  . Allergy   . Wears glasses   . 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 PONV (postoperative nausea and vomiting)   . Anxiety   . History of blood transfusion ~ 04/2014    "related to chemo"  . Anemia ~ 04/2014  . Arthritis     back    SURGICAL HISTORY: Past Surgical History  Procedure Laterality Date  . Colon resection  2004    "for abscess on my colon"  . Hernia repair  2005    umb  . Cholecystectomy  2005  . Portacath placement Left 01/31/2014    Procedure: INSERTION PORT-A-CATH;  Surgeon: Emelia Loron, MD;  Location: [facility] OR;  Service: General;  Laterality: Left;  . Axillary lymph node biopsy Right 12/2013  . Breast biopsy Right 12/2013  . Colon surgery    . Mastectomy w/ sentinel node biopsy Right 06/22/2014  Procedure: RIGHT TOTAL MASTECTOMY WITH SENTINEL LYMPH NODE BIOPSY;  Surgeon: Rolm Bookbinder, MD;  Location: [place];  Service: General;  Laterality: Right;    I have reviewed the social history and family history with the patient and they are unchanged from previous note.  ALLERGIES:  is allergic to ciprofloxacin.  MEDICATIONS:  Current Outpatient  Prescriptions  Medication Sig Dispense Refill  . acetaminophen (TYLENOL) 500 MG tablet Take 1,000 mg by mouth every 6 (six) hours as needed for mild pain.    Marland Kitchen anastrozole (ARIMIDEX) 1 MG tablet Take 1 tablet (1 mg total) by mouth daily. 90 tablet 4  . cetirizine (ZYRTEC) 10 MG tablet Take 10 mg by mouth daily as needed for allergies.    . clonazePAM (KLONOPIN) 0.5 MG tablet Take 0.5 mg by mouth 2 (two) times daily as needed for anxiety.    Marland Kitchen ibuprofen (ADVIL,MOTRIN) 800 MG tablet Take 800 mg by mouth every 8 (eight) hours as needed for moderate pain.    Marland Kitchen lidocaine-prilocaine (EMLA) cream Apply 1 application topically as needed (for infusion site).    Marland Kitchen zolpidem (AMBIEN CR) 12.5 MG CR tablet Take 1 tablet (12.5 mg total) by mouth at bedtime as needed for sleep. 30 tablet 0   No current facility-administered medications for this visit.    PHYSICAL EXAMINATION: ECOG PERFORMANCE STATUS: 0 - Asymptomatic  Filed Vitals:   02/17/15 0842  BP: 152/73  Pulse: 85  Temp: 98.6 F (37 C)  Resp: 19   Filed Weights   02/17/15 0842  Weight: 189 lb (85.73 kg)    GENERAL:alert, no distress and comfortable SKIN: skin color, texture, turgor are normal, no rashes or significant lesions EYES: normal, Conjunctiva are pink and non-injected, sclera clear OROPHARYNX:no exudate, no erythema and lips, buccal mucosa, and tongue normal  NECK: supple, thyroid normal size, non-tender, without nodularity LYMPH:  no palpable lymphadenopathy in the cervical, axillary or inguinal LUNGS: clear to auscultation and percussion with normal breathing effort HEART: regular rate & rhythm and no murmurs and no lower extremity edema ABDOMEN:abdomen soft, non-tender and normal bowel sounds Musculoskeletal:no cyanosis of digits and no clubbing  NEURO: alert & oriented x 3 with fluent speech, no focal motor/sensory deficits Right breast surgically absent,  no palpable lesions on chest wall or axilla note. Left breast exam  no palpable mass, skin change or nipple large.  LABORATORY DATA:  I have reviewed the data as listed CBC Latest Ref Rng 02/17/2015 01/27/2015 01/06/2015  WBC 3.9 - 10.3 10e3/uL 5.7 6.4 6.2  Hemoglobin 11.6 - 15.9 g/dL 12.7 12.6 11.8  Hematocrit 34.8 - 46.6 % 39.7 39.1 36.6  Platelets 145 - 400 10e3/uL 231 211 190     CMP Latest Ref Rng 01/27/2015 01/06/2015 12/15/2014  Glucose 70 - 140 mg/dl 117 111 107  BUN 7.0 - 26.0 mg/dL 20.1 17.0 19.9  Creatinine 0.6 - 1.1 mg/dL 0.9 0.8 0.8  Sodium 136 - 145 mEq/L 146(H) 143 142  Potassium 3.5 - 5.1 mEq/L 4.0 3.8 3.8  Chloride 96 - 112 mEq/L - - -  CO2 22 - 29 mEq/L $Remove'26 28 27  'TXcEKXR$ Calcium 8.4 - 10.4 mg/dL 9.7 9.4 9.5  Total Protein 6.4 - 8.3 g/dL 6.9 6.6 6.6  Total Bilirubin 0.20 - 1.20 mg/dL 0.29 0.33 0.28  Alkaline Phos 40 - 150 U/L 137 118 113  AST 5 - 34 U/L $Remo'14 15 16  'HOsxo$ ALT 0 - 55 U/L $Remo'14 15 16      'wItqa$ RADIOGRAPHIC STUDIES: I have  personally reviewed the radiological images as listed and agreed with the findings in the report.  PET/CT on 12/27/2014 Single 7 mm right neck level IIa lymph node with hypermetabolic activity. This is likely reactive in the absence of other hypermetabolic cervical lymph nodes. No other hypermetabolic lymphadenopathy or definite signs of metastatic disease identified.  Stable focal hypermetabolic soft tissue prominence at the anal verge, which could be inflammatory or neoplastic in etiology. Correlation with physical exam findings is again suggested.  ASSESSMENT AND PLAN  59 year old postmenopausal woman, was found to have a 3.3 right breast mass and a positive axillary lymph nodes, biopsy confirmed invasive ductal carcinoma, ER positive PR positive HER-2 positive.  Her initial PET scan also reviewed 3 hypermetabolic nodes in periportal, measuring up to 2 cm with SUV 14. The periportal node was not bopsied, but likely represented metastatic nodes. She had completed response to neoadjuvant chemotherapy and underwent  right mastectomy.  1. Metastatic right breast cancer to periportal nodes, ER+/PR+/HER2+ -Her case was reviewed in our tumor board this week. Her initial and a post treatment PET scan was reviewed again. Due to the size and metabolic activity of those periportal lymph nodes at diagnosis, she likely has metastatic breast cancer. And the consensus was to continue therapy indefinitely. -Also She had excellent compete response to chemotherapy, She is at VERY high risk for cancer recurrence due to the nature of metastatic disease. We recommend her to continue maintenance Herceptin and anastrozole indefinitely. She agrees with the plan.  -I reviewed her restaging PET scan today in person, the right cervical level II lymph nodes is very small, not palpable, likely reactive to her recent sinusitis.  -We'll monitor her disease status every 3-4 months by PET scan next due in May 2016,  -Continue monitor her ECHO every 3 months when she is on Herceptin therapy, next due on 04/23/2059  -We discussed the risk of osteoporocess when she is on anastrozole, she will continue calcium and vitamin D supplements. We'll obtain a bone density scan. Her last was in 2012. We'll check her vitamin D level also -I suggest her to take Tylenol and ibuprofen alternatively for her arthralgia. -I encouraged her to continue exercise and remain to be active.  2. Hypermetabolic right cervical level II lymph node -This was likely related to her recent sinusitis, less likely metastatic lesion, giving no cervical or other adenopathy on PET scan. -Follow up on next PET scan   3. Anxiety and depression -She is on low-dose clonazepam, for trouble with sleeping and anxiety. Doing well overall. I refilled the medication for her today.  -She tried zoloft and lexapro but could not tolerated.   4. Anemia, likely secondary to chemotherapy. -Improved   Plan -Return to clinic in 3 weeks -Screening Mammogram for left breast -A bone  density scan  -PET and echo in May   ll questions were answered. The patient knows to call the clinic with any problems, questions or concerns. No barriers to learning was detected.  I spent 20 minutes counseling the patient face to face. The total time spent in the appointment was 25 minutes and more than 50% was on counseling and review of test results   Truitt Merle, MD 02/17/2015

## 2015-02-17 NOTE — Patient Instructions (Signed)
Cancer Center Discharge Instructions for Patients Receiving Chemotherapy  Today you received the following chemotherapy agents:  Herceptin  To help prevent nausea and vomiting after your treatment, we encourage you to take your nausea medication as ordered per MD.   If you develop nausea and vomiting that is not controlled by your nausea medication, call the clinic.   BELOW ARE SYMPTOMS THAT SHOULD BE REPORTED IMMEDIATELY:  *FEVER GREATER THAN 100.5 F  *CHILLS WITH OR WITHOUT FEVER  NAUSEA AND VOMITING THAT IS NOT CONTROLLED WITH YOUR NAUSEA MEDICATION  *UNUSUAL SHORTNESS OF BREATH  *UNUSUAL BRUISING OR BLEEDING  TENDERNESS IN MOUTH AND THROAT WITH OR WITHOUT PRESENCE OF ULCERS  *URINARY PROBLEMS  *BOWEL PROBLEMS  UNUSUAL RASH Items with * indicate a potential emergency and should be followed up as soon as possible.  Feel free to call the clinic you have any questions or concerns. The clinic phone number is [phone].    

## 2015-02-17 NOTE — Addendum Note (Signed)
Addended by: Truitt Merle on: 02/17/2015 01:59 PM   Modules accepted: Orders

## 2015-03-10 ENCOUNTER — Ambulatory Visit (HOSPITAL_BASED_OUTPATIENT_CLINIC_OR_DEPARTMENT_OTHER): Payer: BLUE CROSS/BLUE SHIELD

## 2015-03-10 ENCOUNTER — Other Ambulatory Visit (HOSPITAL_BASED_OUTPATIENT_CLINIC_OR_DEPARTMENT_OTHER): Payer: BLUE CROSS/BLUE SHIELD

## 2015-03-10 ENCOUNTER — Other Ambulatory Visit: Payer: Self-pay | Admitting: Hematology

## 2015-03-10 DIAGNOSIS — C50411 Malignant neoplasm of upper-outer quadrant of right female breast: Secondary | ICD-10-CM

## 2015-03-10 DIAGNOSIS — F419 Anxiety disorder, unspecified: Secondary | ICD-10-CM

## 2015-03-10 DIAGNOSIS — Z5112 Encounter for antineoplastic immunotherapy: Secondary | ICD-10-CM

## 2015-03-10 LAB — CBC WITH DIFFERENTIAL/PLATELET
BASO%: 0.4 % (ref 0.0–2.0)
Basophils Absolute: 0 10*3/uL (ref 0.0–0.1)
EOS ABS: 0.1 10*3/uL (ref 0.0–0.5)
EOS%: 1.3 % (ref 0.0–7.0)
HCT: 38.7 % (ref 34.8–46.6)
HGB: 12.7 g/dL (ref 11.6–15.9)
LYMPH%: 16.2 % (ref 14.0–49.7)
MCH: 28.7 pg (ref 25.1–34.0)
MCHC: 32.8 g/dL (ref 31.5–36.0)
MCV: 87.6 fL (ref 79.5–101.0)
MONO#: 0.3 10*3/uL (ref 0.1–0.9)
MONO%: 5.1 % (ref 0.0–14.0)
NEUT%: 77 % — ABNORMAL HIGH (ref 38.4–76.8)
NEUTROS ABS: 5.2 10*3/uL (ref 1.5–6.5)
PLATELETS: 212 10*3/uL (ref 145–400)
RBC: 4.42 10*6/uL (ref 3.70–5.45)
RDW: 13.4 % (ref 11.2–14.5)
WBC: 6.7 10*3/uL (ref 3.9–10.3)
lymph#: 1.1 10*3/uL (ref 0.9–3.3)

## 2015-03-10 LAB — COMPREHENSIVE METABOLIC PANEL (CC13)
ALT: 14 U/L (ref 0–55)
ANION GAP: 11 meq/L (ref 3–11)
AST: 14 U/L (ref 5–34)
Albumin: 3.7 g/dL (ref 3.5–5.0)
Alkaline Phosphatase: 129 U/L (ref 40–150)
BUN: 15.9 mg/dL (ref 7.0–26.0)
CALCIUM: 9.6 mg/dL (ref 8.4–10.4)
CO2: 26 meq/L (ref 22–29)
Chloride: 107 mEq/L (ref 98–109)
Creatinine: 0.8 mg/dL (ref 0.6–1.1)
EGFR: 81 mL/min/{1.73_m2} — AB (ref >90)
Glucose: 104 mg/dl (ref 70–140)
Potassium: 3.9 mEq/L (ref 3.5–5.1)
Sodium: 144 mEq/L (ref 136–145)
Total Bilirubin: 0.39 mg/dL (ref 0.20–1.20)
Total Protein: 6.9 g/dL (ref 6.4–8.3)

## 2015-03-10 MED ORDER — SODIUM CHLORIDE 0.9 % IJ SOLN
10.0000 mL | INTRAMUSCULAR | Status: DC | PRN
  Administered 2015-03-10: 10 mL
  Filled 2015-03-10: qty 10

## 2015-03-10 MED ORDER — ACETAMINOPHEN 325 MG PO TABS
650.0000 mg | ORAL_TABLET | Freq: Once | ORAL | Status: AC
  Administered 2015-03-10: 650 mg via ORAL

## 2015-03-10 MED ORDER — TRASTUZUMAB CHEMO INJECTION 440 MG
6.0000 mg/kg | Freq: Once | INTRAVENOUS | Status: AC
  Administered 2015-03-10: 525 mg via INTRAVENOUS
  Filled 2015-03-10: qty 25

## 2015-03-10 MED ORDER — DIPHENHYDRAMINE HCL 25 MG PO CAPS
25.0000 mg | ORAL_CAPSULE | Freq: Once | ORAL | Status: AC
  Administered 2015-03-10: 25 mg via ORAL

## 2015-03-10 MED ORDER — SODIUM CHLORIDE 0.9 % IV SOLN
Freq: Once | INTRAVENOUS | Status: AC
  Administered 2015-03-10: 10:00:00 via INTRAVENOUS

## 2015-03-10 MED ORDER — DIPHENHYDRAMINE HCL 25 MG PO CAPS
ORAL_CAPSULE | ORAL | Status: AC
  Filled 2015-03-10: qty 1

## 2015-03-10 MED ORDER — ACETAMINOPHEN 325 MG PO TABS
ORAL_TABLET | ORAL | Status: AC
  Filled 2015-03-10: qty 2

## 2015-03-10 MED ORDER — HEPARIN SOD (PORK) LOCK FLUSH 100 UNIT/ML IV SOLN
500.0000 [IU] | Freq: Once | INTRAVENOUS | Status: AC | PRN
  Administered 2015-03-10: 500 [IU]
  Filled 2015-03-10: qty 5

## 2015-03-10 NOTE — Patient Instructions (Signed)
[facility] Discharge Instructions for Patients Receiving Chemotherapy  Today you received the following chemotherapy agents:  Herceptin  To help prevent nausea and vomiting after your treatment, we encourage you to take your nausea medication as prescribed.   If you develop nausea and vomiting that is not controlled by your nausea medication, call the clinic.   BELOW ARE SYMPTOMS THAT SHOULD BE REPORTED IMMEDIATELY:  *FEVER GREATER THAN 100.5 F  *CHILLS WITH OR WITHOUT FEVER  NAUSEA AND VOMITING THAT IS NOT CONTROLLED WITH YOUR NAUSEA MEDICATION  *UNUSUAL SHORTNESS OF BREATH  *UNUSUAL BRUISING OR BLEEDING  TENDERNESS IN MOUTH AND THROAT WITH OR WITHOUT PRESENCE OF ULCERS  *URINARY PROBLEMS  *BOWEL PROBLEMS  UNUSUAL RASH Items with * indicate a potential emergency and should be followed up as soon as possible.  Feel free to call the clinic you have any questions or concerns. The clinic phone number is [phone].  Please show the CHEMO ALERT CARD at check-in to the Emergency Department and triage nurse.   

## 2015-03-11 LAB — VITAMIN D 25 HYDROXY (VIT D DEFICIENCY, FRACTURES): VIT D 25 HYDROXY: 10 ng/mL — AB (ref 30–100)

## 2015-03-13 ENCOUNTER — Other Ambulatory Visit: Payer: Self-pay | Admitting: *Deleted

## 2015-03-13 ENCOUNTER — Other Ambulatory Visit: Payer: Self-pay | Admitting: Hematology

## 2015-03-13 DIAGNOSIS — C50919 Malignant neoplasm of unspecified site of unspecified female breast: Secondary | ICD-10-CM

## 2015-03-13 MED ORDER — ZOLPIDEM TARTRATE ER 12.5 MG PO TBCR: 12.5000 mg | EXTENDED_RELEASE_TABLET | Freq: Every evening | ORAL | Status: DC | PRN

## 2015-03-31 ENCOUNTER — Telehealth: Payer: Self-pay | Admitting: Hematology

## 2015-03-31 ENCOUNTER — Other Ambulatory Visit (HOSPITAL_BASED_OUTPATIENT_CLINIC_OR_DEPARTMENT_OTHER): Payer: BLUE CROSS/BLUE SHIELD

## 2015-03-31 ENCOUNTER — Ambulatory Visit (HOSPITAL_BASED_OUTPATIENT_CLINIC_OR_DEPARTMENT_OTHER): Payer: BLUE CROSS/BLUE SHIELD

## 2015-03-31 ENCOUNTER — Ambulatory Visit (HOSPITAL_BASED_OUTPATIENT_CLINIC_OR_DEPARTMENT_OTHER): Payer: BLUE CROSS/BLUE SHIELD | Admitting: Hematology

## 2015-03-31 DIAGNOSIS — C773 Secondary and unspecified malignant neoplasm of axilla and upper limb lymph nodes: Secondary | ICD-10-CM

## 2015-03-31 DIAGNOSIS — C50411 Malignant neoplasm of upper-outer quadrant of right female breast: Secondary | ICD-10-CM

## 2015-03-31 DIAGNOSIS — Z5112 Encounter for antineoplastic immunotherapy: Secondary | ICD-10-CM

## 2015-03-31 DIAGNOSIS — Z170 Estrogen receptor positive status [ER+]: Secondary | ICD-10-CM

## 2015-03-31 DIAGNOSIS — F418 Other specified anxiety disorders: Secondary | ICD-10-CM

## 2015-03-31 DIAGNOSIS — D6481 Anemia due to antineoplastic chemotherapy: Secondary | ICD-10-CM

## 2015-03-31 DIAGNOSIS — C50919 Malignant neoplasm of unspecified site of unspecified female breast: Secondary | ICD-10-CM

## 2015-03-31 LAB — COMPREHENSIVE METABOLIC PANEL (CC13)
ALT: 12 U/L (ref 0–55)
ANION GAP: 10 meq/L (ref 3–11)
AST: 12 U/L (ref 5–34)
Albumin: 3.6 g/dL (ref 3.5–5.0)
Alkaline Phosphatase: 138 U/L (ref 40–150)
BUN: 18.3 mg/dL (ref 7.0–26.0)
CHLORIDE: 108 meq/L (ref 98–109)
CO2: 26 mEq/L (ref 22–29)
CREATININE: 0.8 mg/dL (ref 0.6–1.1)
Calcium: 9.4 mg/dL (ref 8.4–10.4)
EGFR: 82 mL/min/{1.73_m2} — AB (ref >90)
Glucose: 118 mg/dl (ref 70–140)
Potassium: 4.1 mEq/L (ref 3.5–5.1)
Sodium: 144 mEq/L (ref 136–145)
Total Bilirubin: 0.51 mg/dL (ref 0.20–1.20)
Total Protein: 6.7 g/dL (ref 6.4–8.3)

## 2015-03-31 LAB — CBC WITH DIFFERENTIAL/PLATELET
BASO%: 0.4 % (ref 0.0–2.0)
BASOS ABS: 0 10*3/uL (ref 0.0–0.1)
EOS ABS: 0.1 10*3/uL (ref 0.0–0.5)
EOS%: 2.5 % (ref 0.0–7.0)
HCT: 39.4 % (ref 34.8–46.6)
HEMOGLOBIN: 12.8 g/dL (ref 11.6–15.9)
LYMPH%: 17.6 % (ref 14.0–49.7)
MCH: 27.9 pg (ref 25.1–34.0)
MCHC: 32.4 g/dL (ref 31.5–36.0)
MCV: 86 fL (ref 79.5–101.0)
MONO#: 0.3 10*3/uL (ref 0.1–0.9)
MONO%: 4.5 % (ref 0.0–14.0)
NEUT#: 4.4 10*3/uL (ref 1.5–6.5)
NEUT%: 75 % (ref 38.4–76.8)
Platelets: 227 10*3/uL (ref 145–400)
RBC: 4.58 10*6/uL (ref 3.70–5.45)
RDW: 14.6 % — AB (ref 11.2–14.5)
WBC: 5.9 10*3/uL (ref 3.9–10.3)
lymph#: 1 10*3/uL (ref 0.9–3.3)

## 2015-03-31 MED ORDER — ZOLPIDEM TARTRATE ER 12.5 MG PO TBCR: 12.5000 mg | EXTENDED_RELEASE_TABLET | Freq: Every evening | ORAL | Status: DC | PRN

## 2015-03-31 MED ORDER — ACETAMINOPHEN 325 MG PO TABS
ORAL_TABLET | ORAL | Status: AC
  Filled 2015-03-31: qty 2

## 2015-03-31 MED ORDER — SODIUM CHLORIDE 0.9 % IJ SOLN
10.0000 mL | INTRAMUSCULAR | Status: DC | PRN
  Administered 2015-03-31: 10 mL
  Filled 2015-03-31: qty 10

## 2015-03-31 MED ORDER — DIPHENHYDRAMINE HCL 25 MG PO CAPS
ORAL_CAPSULE | ORAL | Status: AC
  Filled 2015-03-31: qty 1

## 2015-03-31 MED ORDER — HEPARIN SOD (PORK) LOCK FLUSH 100 UNIT/ML IV SOLN
500.0000 [IU] | Freq: Once | INTRAVENOUS | Status: AC | PRN
  Administered 2015-03-31: 500 [IU]
  Filled 2015-03-31: qty 5

## 2015-03-31 MED ORDER — DIPHENHYDRAMINE HCL 25 MG PO CAPS
25.0000 mg | ORAL_CAPSULE | Freq: Once | ORAL | Status: AC
  Administered 2015-03-31: 25 mg via ORAL

## 2015-03-31 MED ORDER — ALPRAZOLAM 0.25 MG PO TABS: 0.2500 mg | ORAL_TABLET | Freq: Three times a day (TID) | ORAL | Status: DC | PRN

## 2015-03-31 MED ORDER — ACETAMINOPHEN 325 MG PO TABS
650.0000 mg | ORAL_TABLET | Freq: Once | ORAL | Status: AC
  Administered 2015-03-31: 650 mg via ORAL

## 2015-03-31 MED ORDER — SODIUM CHLORIDE 0.9 % IV SOLN
Freq: Once | INTRAVENOUS | Status: AC
  Administered 2015-03-31: 10:00:00 via INTRAVENOUS

## 2015-03-31 MED ORDER — TRASTUZUMAB CHEMO INJECTION 440 MG
6.0000 mg/kg | Freq: Once | INTRAVENOUS | Status: AC
  Administered 2015-03-31: 525 mg via INTRAVENOUS
  Filled 2015-03-31: qty 25

## 2015-03-31 NOTE — Patient Instructions (Signed)
[facility] Discharge Instructions for Patients Receiving Chemotherapy  Today you received the following chemotherapy agents:  Herceptin  To help prevent nausea and vomiting after your treatment, we encourage you to take your nausea medication as prescribed.   If you develop nausea and vomiting that is not controlled by your nausea medication, call the clinic.   BELOW ARE SYMPTOMS THAT SHOULD BE REPORTED IMMEDIATELY:  *FEVER GREATER THAN 100.5 F  *CHILLS WITH OR WITHOUT FEVER  NAUSEA AND VOMITING THAT IS NOT CONTROLLED WITH YOUR NAUSEA MEDICATION  *UNUSUAL SHORTNESS OF BREATH  *UNUSUAL BRUISING OR BLEEDING  TENDERNESS IN MOUTH AND THROAT WITH OR WITHOUT PRESENCE OF ULCERS  *URINARY PROBLEMS  *BOWEL PROBLEMS  UNUSUAL RASH Items with * indicate a potential emergency and should be followed up as soon as possible.  Feel free to call the clinic you have any questions or concerns. The clinic phone number is [phone].  Please show the CHEMO ALERT CARD at check-in to the Emergency Department and triage nurse.   

## 2015-03-31 NOTE — Telephone Encounter (Signed)
gave and printedj appt sched and avs fo rpt for April adn May....pt does not

## 2015-03-31 NOTE — Telephone Encounter (Signed)
i gave pt the breast center # she will have all her records faxed from [place] and she will sched bone density and Mammo.

## 2015-03-31 NOTE — Progress Notes (Signed)
[name]  [phone] [phone]) [phone]  Clinic Follow up Note   Patient Care Team: [name], MD as PCP - General (Nurse Practitioner) 03/31/2015  SUMMARY OF ONCOLOGIC HISTORY: Breast cancer of upper-outer quadrant of right female breast   Staging form: Breast, AJCC 7th Edition     Clinical: Stage IV (T2, N1, M1) - Signed by Truitt Merle, MD on 12/29/2014       Prognostic indicators: ER/PR/HER2NEU +      Pathologic: No stage assigned - Unsigned       Prognostic indicators: ER/PR/HER2NEU +      Breast cancer of upper-outer quadrant of right female breast   01/03/2014 Initial Diagnosis Breast cancer of upper-outer quadrant of right female breast, IDA, grade 2, ER 90%+, PR 1%+, her2 (+)   01/20/2014 PET scan Hypermetabolic mass in the posterior right breast (primary), 2 additional nodules in deep right breast (likely nodes) and 3 hyermetabolic portal nodes (lasrgest 2cm, SUV 13).    02/03/2014 - 05/20/2014 Neo-Adjuvant Chemotherapy docetaxel, Taxotere, Carboplatin, Herceptin, Perjeta for 6 cycles    06/01/2014 PET scan Minimal residual hypermetabolic activity within the residual mass inferolaterally in the right breast, likely surgical change. No  other hypermetabolic node or lesion.    06/22/2014 Surgery right mastectomy with negative margines and axillary node dissection. ypT0N0, complete patholigical resposne.     CURRENT THERAPY:  1.Trastuzumab 6 mg/kg every 3 weeks indefinitely 2. Anastrozole 1 mg once daily  INTERVAL HISTORY: Madeline Davidson returns for follow-up. She is doing well overall. She tolerates trastuzumab infusion and anastrozole very well without significant side effects, except mild joint discomfort and stiffness on fingers, back and feet. She has mild hot flush, but tolerable. No other issues. She complains being anxious lately, since she was told her that if she has stage IV disease. She was like to try and anxiety medication.  REVIEW OF SYSTEMS:     Constitutional: Denies fevers, chills or abnormal weight loss Eyes: Denies blurriness of vision Ears, nose, mouth, throat, and face: Denies mucositis or sore throat Respiratory: Denies cough, dyspnea or wheezes Cardiovascular: Denies palpitation, chest discomfort or lower extremity swelling Gastrointestinal:  Denies nausea, heartburn or change in bowel habits Skin: Denies abnormal skin rashes Lymphatics: Denies new lymphadenopathy or easy bruising Neurological:Denies numbness, tingling or new weaknesses Behavioral/Psych: Mood is stable, no new changes  All other systems were reviewed with the patient and are negative.  MEDICAL HISTORY:  Past Medical History  Diagnosis Date  . Allergy   . Wears glasses   . 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 PONV (postoperative nausea and vomiting)   . Anxiety   . History of blood transfusion ~ 04/2014    "related to chemo"  . Anemia ~ 04/2014  . Arthritis     back    SURGICAL HISTORY: Past Surgical History  Procedure Laterality Date  . Colon resection  2004    "for abscess on my colon"  . Hernia repair  2005    umb  . Cholecystectomy  2005  . Portacath placement Left 01/31/2014    Procedure: INSERTION PORT-A-CATH;  Surgeon: Rolm Bookbinder, MD;  Location: [place];  Service: General;  Laterality: Left;  . Axillary lymph node biopsy Right 12/2013  . Breast biopsy Right 12/2013  . Colon surgery    . Mastectomy w/ sentinel node biopsy Right 06/22/2014    Procedure: RIGHT TOTAL MASTECTOMY WITH SENTINEL LYMPH NODE BIOPSY;  Surgeon: Rolm Bookbinder, MD;  Location: [place];  Service: General;  Laterality: Right;    I have reviewed the social history and family history with the patient and they are unchanged from previous note.  ALLERGIES:  is allergic to ciprofloxacin.  MEDICATIONS:  Current Outpatient Prescriptions  Medication Sig Dispense Refill  . acetaminophen (TYLENOL) 500 MG tablet  Take 1,000 mg by mouth every 6 (six) hours as needed for mild pain.    Marland Kitchen anastrozole (ARIMIDEX) 1 MG tablet Take 1 tablet (1 mg total) by mouth daily. 90 tablet 4  . cetirizine (ZYRTEC) 10 MG tablet Take 10 mg by mouth daily as needed for allergies.    . clonazePAM (KLONOPIN) 0.5 MG tablet Take 1 tablet (0.5 mg total) by mouth 2 (two) times daily as needed for anxiety. 30 tablet 0  . ibuprofen (ADVIL,MOTRIN) 800 MG tablet Take 800 mg by mouth every 8 (eight) hours as needed for moderate pain.    Marland Kitchen lidocaine-prilocaine (EMLA) cream Apply 1 application topically as needed (for infusion site).    Marland Kitchen zolpidem (AMBIEN CR) 12.5 MG CR tablet Take 1 tablet (12.5 mg total) by mouth at bedtime as needed for sleep. 30 tablet 0   No current facility-administered medications for this visit.    PHYSICAL EXAMINATION: ECOG PERFORMANCE STATUS: 0 - Asymptomatic  Filed Vitals:   03/31/15 0915  BP: 152/95  Pulse: 96  Temp: 98.8 F (37.1 C)  Resp: 18   Filed Weights   03/31/15 0915  Weight: 190 lb 8 oz (86.41 kg)    GENERAL:alert, no distress and comfortable SKIN: skin color, texture, turgor are normal, no rashes or significant lesions EYES: normal, Conjunctiva are pink and non-injected, sclera clear OROPHARYNX:no exudate, no erythema and lips, buccal mucosa, and tongue normal  NECK: supple, thyroid normal size, non-tender, without nodularity LYMPH:  no palpable lymphadenopathy in the cervical, axillary or inguinal LUNGS: clear to auscultation and percussion with normal breathing effort HEART: regular rate & rhythm and no murmurs and no lower extremity edema ABDOMEN:abdomen soft, non-tender and normal bowel sounds Musculoskeletal:no cyanosis of digits and no clubbing  NEURO: alert & oriented x 3 with fluent speech, no focal motor/sensory deficits Right breast surgically absent,  no palpable lesions on chest wall or axilla note. Left breast exam no palpable mass, skin change or nipple  large.  LABORATORY DATA:  I have reviewed the data as listed CBC Latest Ref Rng 03/31/2015 03/10/2015 02/17/2015  WBC 3.9 - 10.3 10e3/uL 5.9 6.7 5.7  Hemoglobin 11.6 - 15.9 g/dL 12.8 12.7 12.7  Hematocrit 34.8 - 46.6 % 39.4 38.7 39.7  Platelets 145 - 400 10e3/uL 227 212 231     CMP Latest Ref Rng 03/10/2015 02/17/2015 01/27/2015  Glucose 70 - 140 mg/dl 104 96 117  BUN 7.0 - 26.0 mg/dL 15.9 19.0 20.1  Creatinine 0.6 - 1.1 mg/dL 0.8 0.8 0.9  Sodium 136 - 145 mEq/L 144 146(H) 146(H)  Potassium 3.5 - 5.1 mEq/L 3.9 3.7 4.0  Chloride 96 - 112 mEq/L - - -  CO2 22 - 29 mEq/L $Remove'26 26 26  'zDjNfWd$ Calcium 8.4 - 10.4 mg/dL 9.6 9.9 9.7  Total Protein 6.4 - 8.3 g/dL 6.9 7.0 6.9  Total Bilirubin 0.20 - 1.20 mg/dL 0.39 0.41 0.29  Alkaline Phos 40 - 150 U/L 129 143 137  AST 5 - 34 U/L $Remo'14 14 14  'numIp$ ALT 0 - 55 U/L $Remo'14 12 14      'ztRqp$ RADIOGRAPHIC STUDIES: I have personally reviewed the radiological  images as listed and agreed with the findings in the report.  PET/CT on 12/27/2014 Single 7 mm right neck level IIa lymph node with hypermetabolic activity. This is likely reactive in the absence of other hypermetabolic cervical lymph nodes. No other hypermetabolic lymphadenopathy or definite signs of metastatic disease identified.  Stable focal hypermetabolic soft tissue prominence at the anal verge, which could be inflammatory or neoplastic in etiology. Correlation with physical exam findings is again suggested.  ASSESSMENT AND PLAN  59 year old postmenopausal woman, was found to have a 3.3 right breast mass and a positive axillary lymph nodes, biopsy confirmed invasive ductal carcinoma, ER positive PR positive HER-2 positive.  Her initial PET scan also reviewed 3 hypermetabolic nodes in periportal, measuring up to 2 cm with SUV 14. The periportal node was not bopsied, but likely represented metastatic nodes. She had completed response to neoadjuvant chemotherapy and underwent right mastectomy.  1. Metastatic right  breast cancer to periportal nodes, ER+/PR+/HER2+ -Her case was reviewed in our tumor board this week. Her initial and a post treatment PET scan was reviewed again. Due to the size and metabolic activity of those periportal lymph nodes at diagnosis, she likely has metastatic breast cancer. And the consensus was to continue therapy indefinitely. -Also She had excellent compete response to chemotherapy, She is at VERY high risk for cancer recurrence due to the nature of metastatic disease. We recommend her to continue maintenance Herceptin and anastrozole indefinitely. She agrees with the plan.  Burnis Medin monitor her disease status every 3-4 months by PET scan next due in May 2016,  -Continue monitor her ECHO every 3 months when she is on Herceptin therapy, next due on 04/23/2059  -We discussed the risk of osteoporocess when she is on anastrozole, she will continue calcium and vitamin D supplements. We'll obtain a bone density scan. Her last was in 2012. We'll check her vitamin D level also -I suggest her to take Tylenol and ibuprofen alternatively for her arthralgia. -I encouraged her to continue exercise and remain to be active.  2. Hypermetabolic right cervical level II lymph node -This was likely related to her recent sinusitis, less likely metastatic lesion, giving no cervical or other adenopathy on PET scan. -Follow up on next PET scan   3. Anxiety and depression -She is on low-dose clonazepam, for trouble with sleeping and anxiety, but feels not very effective.  -will switch her to xanax, prescription given today   -She tried zoloft and lexapro but could not tolerated.   4. Anemia, likely secondary to chemotherapy. -Improved   Plan -Return to clinic in 3 weeks with APP and 6 weeks with me  -Screening Mammogram for left breast -A bone density scan  -PET and echo in May  -continue current therapy -try xanax   ll questions were answered. The patient knows to call the clinic with any  problems, questions or concerns. No barriers to learning was detected.  I spent 20 minutes counseling the patient face to face. The total time spent in the appointment was 25 minutes and more than 50% was on counseling and review of test results   Truitt Merle, MD 03/31/2015

## 2015-04-01 ENCOUNTER — Encounter: Payer: Self-pay | Admitting: Hematology

## 2015-04-03 ENCOUNTER — Telehealth: Payer: Self-pay | Admitting: *Deleted

## 2015-04-17 ENCOUNTER — Telehealth: Payer: Self-pay | Admitting: Hematology

## 2015-04-17 NOTE — Telephone Encounter (Signed)
Confirmed appointment for Echo.

## 2015-04-21 ENCOUNTER — Ambulatory Visit (HOSPITAL_BASED_OUTPATIENT_CLINIC_OR_DEPARTMENT_OTHER): Payer: BLUE CROSS/BLUE SHIELD

## 2015-04-21 ENCOUNTER — Other Ambulatory Visit (HOSPITAL_BASED_OUTPATIENT_CLINIC_OR_DEPARTMENT_OTHER): Payer: BLUE CROSS/BLUE SHIELD

## 2015-04-21 ENCOUNTER — Ambulatory Visit (HOSPITAL_BASED_OUTPATIENT_CLINIC_OR_DEPARTMENT_OTHER): Payer: BLUE CROSS/BLUE SHIELD | Admitting: Nurse Practitioner

## 2015-04-21 ENCOUNTER — Encounter: Payer: Self-pay | Admitting: Nurse Practitioner

## 2015-04-21 VITALS — BP 156/84 | HR 90 | Temp 98.1°F | Resp 20 | Ht 64.0 in | Wt 192.8 lb

## 2015-04-21 DIAGNOSIS — C50411 Malignant neoplasm of upper-outer quadrant of right female breast: Secondary | ICD-10-CM

## 2015-04-21 DIAGNOSIS — C773 Secondary and unspecified malignant neoplasm of axilla and upper limb lymph nodes: Secondary | ICD-10-CM | POA: Diagnosis not present

## 2015-04-21 DIAGNOSIS — Z5112 Encounter for antineoplastic immunotherapy: Secondary | ICD-10-CM | POA: Diagnosis not present

## 2015-04-21 DIAGNOSIS — M8580 Other specified disorders of bone density and structure, unspecified site: Secondary | ICD-10-CM

## 2015-04-21 DIAGNOSIS — F419 Anxiety disorder, unspecified: Secondary | ICD-10-CM

## 2015-04-21 DIAGNOSIS — D6481 Anemia due to antineoplastic chemotherapy: Secondary | ICD-10-CM

## 2015-04-21 DIAGNOSIS — F418 Other specified anxiety disorders: Secondary | ICD-10-CM

## 2015-04-21 DIAGNOSIS — Z170 Estrogen receptor positive status [ER+]: Secondary | ICD-10-CM

## 2015-04-21 LAB — CBC WITH DIFFERENTIAL/PLATELET
BASO%: 0.8 % (ref 0.0–2.0)
Basophils Absolute: 0 10*3/uL (ref 0.0–0.1)
EOS%: 2.3 % (ref 0.0–7.0)
Eosinophils Absolute: 0.1 10*3/uL (ref 0.0–0.5)
HCT: 38.5 % (ref 34.8–46.6)
HGB: 12.7 g/dL (ref 11.6–15.9)
LYMPH%: 17 % (ref 14.0–49.7)
MCH: 28.2 pg (ref 25.1–34.0)
MCHC: 32.9 g/dL (ref 31.5–36.0)
MCV: 85.6 fL (ref 79.5–101.0)
MONO#: 0.3 10*3/uL (ref 0.1–0.9)
MONO%: 4.7 % (ref 0.0–14.0)
NEUT#: 4.3 10*3/uL (ref 1.5–6.5)
NEUT%: 75.2 % (ref 38.4–76.8)
Platelets: 243 10*3/uL (ref 145–400)
RBC: 4.5 10*6/uL (ref 3.70–5.45)
RDW: 14 % (ref 11.2–14.5)
WBC: 5.7 10*3/uL (ref 3.9–10.3)
lymph#: 1 10*3/uL (ref 0.9–3.3)

## 2015-04-21 LAB — COMPREHENSIVE METABOLIC PANEL (CC13)
ALT: 17 U/L (ref 0–55)
AST: 17 U/L (ref 5–34)
Albumin: 3.7 g/dL (ref 3.5–5.0)
Alkaline Phosphatase: 137 U/L (ref 40–150)
Anion Gap: 13 mEq/L — ABNORMAL HIGH (ref 3–11)
BILIRUBIN TOTAL: 0.47 mg/dL (ref 0.20–1.20)
BUN: 19.6 mg/dL (ref 7.0–26.0)
CO2: 27 meq/L (ref 22–29)
CREATININE: 0.9 mg/dL (ref 0.6–1.1)
Calcium: 9.7 mg/dL (ref 8.4–10.4)
Chloride: 104 mEq/L (ref 98–109)
EGFR: 67 mL/min/{1.73_m2} — AB (ref >90)
GLUCOSE: 125 mg/dL (ref 70–140)
Potassium: 4 mEq/L (ref 3.5–5.1)
Sodium: 144 mEq/L (ref 136–145)
Total Protein: 6.9 g/dL (ref 6.4–8.3)

## 2015-04-21 MED ORDER — TRASTUZUMAB CHEMO INJECTION 440 MG
6.0000 mg/kg | Freq: Once | INTRAVENOUS | Status: AC
  Administered 2015-04-21: 525 mg via INTRAVENOUS
  Filled 2015-04-21: qty 25

## 2015-04-21 MED ORDER — DIPHENHYDRAMINE HCL 25 MG PO CAPS
25.0000 mg | ORAL_CAPSULE | Freq: Once | ORAL | Status: AC
  Administered 2015-04-21: 25 mg via ORAL

## 2015-04-21 MED ORDER — DIPHENHYDRAMINE HCL 25 MG PO CAPS
ORAL_CAPSULE | ORAL | Status: AC
  Filled 2015-04-21: qty 1

## 2015-04-21 MED ORDER — IBUPROFEN 800 MG PO TABS: 800.0000 mg | ORAL_TABLET | Freq: Three times a day (TID) | ORAL | Status: AC | PRN

## 2015-04-21 MED ORDER — ACETAMINOPHEN 325 MG PO TABS
ORAL_TABLET | ORAL | Status: AC
  Filled 2015-04-21: qty 2

## 2015-04-21 MED ORDER — ACETAMINOPHEN 325 MG PO TABS
650.0000 mg | ORAL_TABLET | Freq: Once | ORAL | Status: AC
  Administered 2015-04-21: 650 mg via ORAL

## 2015-04-21 MED ORDER — SODIUM CHLORIDE 0.9 % IV SOLN
Freq: Once | INTRAVENOUS | Status: AC
  Administered 2015-04-21: 10:00:00 via INTRAVENOUS

## 2015-04-21 NOTE — Progress Notes (Signed)
[place]  Telephone:([phone] Fax:(336) [phone]  Clinic Follow up Note   Patient Care Team: [name], MD as PCP - General (Nurse Practitioner) 04/21/2015  SUMMARY OF ONCOLOGIC HISTORY: Breast cancer of upper-outer quadrant of right female breast   Staging form: Breast, AJCC 7th Edition     Clinical: Stage IV (T2, N1, M1) - Signed by Truitt Merle, MD on 12/29/2014       Prognostic indicators: ER/PR/HER2NEU +      Pathologic: No stage assigned - Unsigned       Prognostic indicators: ER/PR/HER2NEU +      Breast cancer of upper-outer quadrant of right female breast   01/03/2014 Initial Diagnosis Breast cancer of upper-outer quadrant of right female breast, IDA, grade 2, ER 90%+, PR 1%+, her2 (+)   01/20/2014 PET scan Hypermetabolic mass in the posterior right breast (primary), 2 additional nodules in deep right breast (likely nodes) and 3 hyermetabolic portal nodes (lasrgest 2cm, SUV 13).    02/03/2014 - 05/20/2014 Neo-Adjuvant Chemotherapy docetaxel, Taxotere, Carboplatin, Herceptin, Perjeta for 6 cycles    06/01/2014 PET scan Minimal residual hypermetabolic activity within the residual mass inferolaterally in the right breast, likely surgical change. No  other hypermetabolic node or lesion.    06/22/2014 Surgery right mastectomy with negative margines and axillary node dissection. ypT0N0, complete patholigical resposne.     CURRENT THERAPY:  1.Trastuzumab 6 mg/kg every 3 weeks indefinitely 2. Anastrozole 1 mg once daily  INTERVAL HISTORY: Madeline Davidson returns for follow-up of her breast cancer. She continues to do well with her treatments, and has no complications or complaints with the trastuzumab. She has joint pain, stiff hands, and hot flashes from the anastrozole, but tolerates this well. She has anxiety surrounding her diagnosis, and was prescribed xanax on her last visit. She has stopped the klonopin but is waiting until this weekend to try the xanax.   REVIEW OF  SYSTEMS:   Constitutional: Denies fevers, chills or abnormal weight loss Eyes: Denies blurriness of vision Ears, nose, mouth, throat, and face: Denies mucositis or sore throat Respiratory: Denies cough, dyspnea or wheezes Cardiovascular: Denies palpitation, chest discomfort or lower extremity swelling Gastrointestinal:  Denies nausea, heartburn or change in bowel habits Skin: Denies abnormal skin rashes Lymphatics: Denies new lymphadenopathy or easy bruising Neurological:Denies numbness, tingling or new weaknesses Behavioral/Psych: Mood is stable, no new changes  All other systems were reviewed with the patient and are negative.  MEDICAL HISTORY:  Past Medical History  Diagnosis Date  . Allergy   . Wears glasses   . 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 PONV (postoperative nausea and vomiting)   . Anxiety   . History of blood transfusion ~ 04/2014    "related to chemo"  . Anemia ~ 04/2014  . Arthritis     back    SURGICAL HISTORY: Past Surgical History  Procedure Laterality Date  . Colon resection  2004    "for abscess on my colon"  . Hernia repair  2005    umb  . Cholecystectomy  2005  . Portacath placement Left 01/31/2014    Procedure: INSERTION PORT-A-CATH;  Surgeon: Rolm Bookbinder, MD;  Location: [place];  Service: General;  Laterality: Left;  . Axillary lymph node biopsy Right 12/2013  . Breast biopsy Right 12/2013  . Colon surgery    . Mastectomy w/ sentinel node biopsy Right 06/22/2014    Procedure: RIGHT TOTAL MASTECTOMY WITH SENTINEL  LYMPH NODE BIOPSY;  Surgeon: Rolm Bookbinder, MD;  Location: [place];  Service: General;  Laterality: Right;    I have reviewed the social history and family history with the patient and they are unchanged from previous note.  ALLERGIES:  is allergic to ciprofloxacin.  MEDICATIONS:  Current Outpatient Prescriptions  Medication Sig Dispense Refill  . anastrozole (ARIMIDEX) 1 MG  tablet Take 1 tablet (1 mg total) by mouth daily. 90 tablet 4  . cetirizine (ZYRTEC) 10 MG tablet Take 10 mg by mouth daily as needed for allergies.    . clonazePAM (KLONOPIN) 0.5 MG tablet Take 1 tablet (0.5 mg total) by mouth 2 (two) times daily as needed for anxiety. 30 tablet 0  . ibuprofen (ADVIL,MOTRIN) 800 MG tablet Take 1 tablet (800 mg total) by mouth every 8 (eight) hours as needed for moderate pain. 90 tablet 2  . lidocaine-prilocaine (EMLA) cream Apply 1 application topically as needed (for infusion site).    Marland Kitchen zolpidem (AMBIEN CR) 12.5 MG CR tablet Take 1 tablet (12.5 mg total) by mouth at bedtime as needed for sleep. 30 tablet 3  . acetaminophen (TYLENOL) 500 MG tablet Take 1,000 mg by mouth every 6 (six) hours as needed for mild pain.    Marland Kitchen ALPRAZolam (XANAX) 0.25 MG tablet Take 1 tablet (0.25 mg total) by mouth 3 (three) times daily as needed for anxiety. (Patient not taking: Reported on 04/21/2015) 30 tablet 0   No current facility-administered medications for this visit.    PHYSICAL EXAMINATION: ECOG PERFORMANCE STATUS: 0 - Asymptomatic  Filed Vitals:   04/21/15 0921  BP: 156/84  Pulse: 90  Temp: 98.1 F (36.7 C)  Resp: 20   Filed Weights   04/21/15 0921  Weight: 192 lb 12.8 oz (87.454 kg)   Skin: warm, dry  HEENT: sclerae anicteric, conjunctivae pink, oropharynx clear. No thrush or mucositis.  Lymph Nodes: No cervical or supraclavicular lymphadenopathy  Lungs: clear to auscultation bilaterally, no rales, wheezes, or rhonci  Heart: regular rate and rhythm  Abdomen: round, soft, non tender, positive bowel sounds  Musculoskeletal: No focal spinal tenderness, no peripheral edema  Neuro: non focal, well oriented, positive affect  Breasts: deferred  LABORATORY DATA:  I have reviewed the data as listed CBC Latest Ref Rng 04/21/2015 03/31/2015 03/10/2015  WBC 3.9 - 10.3 10e3/uL 5.7 5.9 6.7  Hemoglobin 11.6 - 15.9 g/dL 12.7 12.8 12.7  Hematocrit 34.8 - 46.6 % 38.5 39.4  38.7  Platelets 145 - 400 10e3/uL 243 227 212     CMP Latest Ref Rng 04/21/2015 03/31/2015 03/10/2015  Glucose 70 - 140 mg/dl 125 118 104  BUN 7.0 - 26.0 mg/dL 19.6 18.3 15.9  Creatinine 0.6 - 1.1 mg/dL 0.9 0.8 0.8  Sodium 136 - 145 mEq/L 144 144 144  Potassium 3.5 - 5.1 mEq/L 4.0 4.1 3.9  Chloride 96 - 112 mEq/L - - -  CO2 22 - 29 mEq/L $Remove'27 26 26  'mprjUKZ$ Calcium 8.4 - 10.4 mg/dL 9.7 9.4 9.6  Total Protein 6.4 - 8.3 g/dL 6.9 6.7 6.9  Total Bilirubin 0.20 - 1.20 mg/dL 0.47 0.51 0.39  Alkaline Phos 40 - 150 U/L 137 138 129  AST 5 - 34 U/L $Remo'17 12 14  'uYFuE$ ALT 0 - 55 U/L $Remo'17 12 14      'xtNdE$ RADIOGRAPHIC STUDIES: I have personally reviewed the radiological images as listed and agreed with the findings in the report.  PET/CT on 12/27/2014 Single 7 mm right neck level IIa lymph node with  hypermetabolic activity. This is likely reactive in the absence of other hypermetabolic cervical lymph nodes. No other hypermetabolic lymphadenopathy or definite signs of metastatic disease identified.  Stable focal hypermetabolic soft tissue prominence at the anal verge, which could be inflammatory or neoplastic in etiology. Correlation with physical exam findings is again suggested.  ASSESSMENT AND PLAN  59 year old postmenopausal woman, was found to have a 3.3 right breast mass and a positive axillary lymph nodes, biopsy confirmed invasive ductal carcinoma, ER positive PR positive HER-2 positive.  Her initial PET scan also reviewed 3 hypermetabolic nodes in periportal, measuring up to 2 cm with SUV 14. The periportal node was not bopsied, but likely represented metastatic nodes. She had completed response to neoadjuvant chemotherapy and underwent right mastectomy.  1. Metastatic right breast cancer to periportal nodes, ER+/PR+/HER2+ -Her case was reviewed in our tumor board this week. Her initial and a post treatment PET scan was reviewed again. Due to the size and metabolic activity of those periportal lymph nodes at  diagnosis, she likely has metastatic breast cancer. And the consensus was to continue therapy indefinitely. -Also She had excellent compete response to chemotherapy, She is at VERY high risk for cancer recurrence due to the nature of metastatic disease. We recommend her to continue maintenance Herceptin and anastrozole indefinitely. She agrees with the plan.  Burnis Medin monitor her disease status every 3-4 months by PET scan next due in May 2016,  -Continue monitor her ECHO every 3 months when she is on Herceptin therapy, next due on 05/08/2015  - The risk of osteoporosis while she is on anastrozole was dicussed, she will continue calcium and vitamin D supplements. We'll obtain a bone density scan. Her last was in 2012. We'll check her vitamin D level also -I suggest her to take Tylenol and ibuprofen alternatively for her arthralgia. -I encouraged her to continue exercise and remain to be active.  2. Hypermetabolic right cervical level II lymph node -This was likely related to her recent sinusitis, less likely metastatic lesion, giving no cervical or other adenopathy on PET scan. -Follow up on next PET scan   3. Anxiety and depression -waiting to try xanax this weekend, stopped klonopin a few days ago   -She tried zoloft and lexapro but could not tolerated.   4. Anemia, likely secondary to chemotherapy. -Improved   Plan -Return to clinic in 3 weeks for visit with Dr. Burr Medico  -Screening Mammogram for left breast and bone density scan to be performed at [facility]. Having schedulers make contact.  -PET and echo in May  -continue current therapy -try xanax this weekend.  ll questions were answered. The patient knows to call the clinic with any problems, questions or concerns. No barriers to learning was detected.  I spent 10 minutes counseling the patient face to face. The total time spent in the appointment was 15 minutes and more than 50% was on counseling and review of test  results   Truitt Merle, MD 04/21/2015

## 2015-04-21 NOTE — Patient Instructions (Signed)
[facility] Discharge Instructions for Patients Receiving Chemotherapy  Today you received the following chemotherapy agents:  Herceptin  To help prevent nausea and vomiting after your treatment, we encourage you to take your nausea medication as prescribed.   If you develop nausea and vomiting that is not controlled by your nausea medication, call the clinic.   BELOW ARE SYMPTOMS THAT SHOULD BE REPORTED IMMEDIATELY:  *FEVER GREATER THAN 100.5 F  *CHILLS WITH OR WITHOUT FEVER  NAUSEA AND VOMITING THAT IS NOT CONTROLLED WITH YOUR NAUSEA MEDICATION  *UNUSUAL SHORTNESS OF BREATH  *UNUSUAL BRUISING OR BLEEDING  TENDERNESS IN MOUTH AND THROAT WITH OR WITHOUT PRESENCE OF ULCERS  *URINARY PROBLEMS  *BOWEL PROBLEMS  UNUSUAL RASH Items with * indicate a potential emergency and should be followed up as soon as possible.  Feel free to call the clinic you have any questions or concerns. The clinic phone number is [phone].  Please show the CHEMO ALERT CARD at check-in to the Emergency Department and triage nurse.   

## 2015-05-04 ENCOUNTER — Other Ambulatory Visit (HOSPITAL_COMMUNITY): Payer: Self-pay | Admitting: Cardiology

## 2015-05-04 DIAGNOSIS — C50411 Malignant neoplasm of upper-outer quadrant of right female breast: Secondary | ICD-10-CM

## 2015-05-08 ENCOUNTER — Ambulatory Visit (HOSPITAL_BASED_OUTPATIENT_CLINIC_OR_DEPARTMENT_OTHER)
Admission: RE | Admit: 2015-05-08 | Discharge: 2015-05-08 | Disposition: A | Discharge status: Home / Self Care | Payer: BLUE CROSS/BLUE SHIELD | Source: Ambulatory Visit | Attending: Internal Medicine | Admitting: Internal Medicine

## 2015-05-08 ENCOUNTER — Ambulatory Visit (HOSPITAL_COMMUNITY)
Admission: RE | Admit: 2015-05-08 | Discharge: 2015-05-08 | Disposition: A | Discharge status: Home / Self Care | Payer: BLUE CROSS/BLUE SHIELD | Source: Ambulatory Visit | Attending: Nurse Practitioner | Admitting: Nurse Practitioner

## 2015-05-08 VITALS — BP 144/80 | HR 94 | Wt 195.4 lb

## 2015-05-08 DIAGNOSIS — E669 Obesity, unspecified: Secondary | ICD-10-CM | POA: Diagnosis not present

## 2015-05-08 DIAGNOSIS — Z79811 Long term (current) use of aromatase inhibitors: Secondary | ICD-10-CM | POA: Diagnosis not present

## 2015-05-08 DIAGNOSIS — Z79899 Other long term (current) drug therapy: Secondary | ICD-10-CM | POA: Insufficient documentation

## 2015-05-08 DIAGNOSIS — C50411 Malignant neoplasm of upper-outer quadrant of right female breast: Secondary | ICD-10-CM

## 2015-05-08 DIAGNOSIS — I10 Essential (primary) hypertension: Secondary | ICD-10-CM | POA: Insufficient documentation

## 2015-05-08 MED ORDER — LISINOPRIL 10 MG PO TABS: 10.0000 mg | ORAL_TABLET | Freq: Every day | ORAL | Status: DC

## 2015-05-08 NOTE — Progress Notes (Signed)
  Echocardiogram 2D Echocardiogram has been performed.  Madeline Davidson 05/08/2015, 9:46 AM

## 2015-05-08 NOTE — Progress Notes (Signed)
Patient ID: Madeline Davidson, female   DOB: [birth year], 59 y.o.   MRN: [id number] Oncologist: Dr Burr Medico PCP: Dr Norma Fredrickson  HPI:  Madeline Davidson is a 59 year old female with HTN, obesity and right breast cancer in the lower outer quadrant stage II (T2, N1). She was referred to the cardio-oncology clinic by Dr Humphrey Rolls   She started chemo 02/03/14. Has finished chemo. Now getting Herceptin every 3 weeks indefinitely due to metastatic disease (lymphadenopathy). S/p mastectomy in July 2015.   Patient stopped her lisinopril/HCTZ in 5/15 due to low blood pressure and dehydration.  BP is now running high (SBP 140s-150s).  She has been doing well symptomatically, no chest pain or dyspnea.     ECHO 01/24/14 EF 60-65% Lateral S' 9.8 ECHO 6/15 EF 60-65%, lateral S' 12.8, strain not done Echo 10/14/14  EF 60-65% lateral s' 11.1 GLS -23.8% Echo 2/16 EF 55-60%, poor strain tracking Echo 5/16 EF 60%, lateral s' 13.9 cm/sec, GLS -19% (poor images for strain)  FH: Uncle has HF. Mom has HTN   SH: Lives with her Mom. Works full time [place]: All systems reviewed and negative except as per HPI.   Past Medical History  Diagnosis Date  . Allergy   . Wears glasses   . 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 PONV (postoperative nausea and vomiting)   . Anxiety   . History of blood transfusion ~ 04/2014    "related to chemo"  . Anemia ~ 04/2014  . Arthritis     back    Current Outpatient Prescriptions  Medication Sig Dispense Refill  . acetaminophen (TYLENOL) 500 MG tablet Take 1,000 mg by mouth every 6 (six) hours as needed for mild pain.    Marland Kitchen anastrozole (ARIMIDEX) 1 MG tablet Take 1 tablet (1 mg total) by mouth daily. 90 tablet 4  . cetirizine (ZYRTEC) 10 MG tablet Take 10 mg by mouth daily as needed for allergies.    . clonazePAM (KLONOPIN) 0.5 MG tablet Take 1 tablet (0.5 mg total) by mouth 2 (two) times daily as needed for anxiety. 30 tablet 0   . ibuprofen (ADVIL,MOTRIN) 800 MG tablet Take 1 tablet (800 mg total) by mouth every 8 (eight) hours as needed for moderate pain. 90 tablet 2  . lidocaine-prilocaine (EMLA) cream Apply 1 application topically as needed (for infusion site).    Marland Kitchen zolpidem (AMBIEN CR) 12.5 MG CR tablet Take 1 tablet (12.5 mg total) by mouth at bedtime as needed for sleep. 30 tablet 3  . ALPRAZolam (XANAX) 0.25 MG tablet Take 1 tablet (0.25 mg total) by mouth 3 (three) times daily as needed for anxiety. (Patient not taking: Reported on 05/08/2015) 30 tablet 0  . lisinopril (PRINIVIL,ZESTRIL) 10 MG tablet Take 1 tablet (10 mg total) by mouth daily. 90 tablet 3   No current facility-administered medications for this encounter.     Allergies  Allergen Reactions  . Ciprofloxacin Other (See Comments)    Patient had a headache after taking Cipro. Made her sinus infection symptoms worse.    History   Social History  . Marital Status: Divorced    Spouse Name: N/A  . Number of Children: N/A  . Years of Education: N/A   Occupational History  . Not on file.   Social History Main Topics  . Smoking status: Never Smoker   . Smokeless tobacco: Never Used  . Alcohol Use: No  . Drug  Use: No  . Sexual Activity: No   Other Topics Concern  . Not on file   Social History Narrative    Family History  Problem Relation Age of Onset  . Cancer Father 29    colon cancer  . Hypertension Mother     PHYSICAL EXAM: Filed Vitals:   05/08/15 0944  BP: 144/80  Pulse: 94   General:  Well appearing. No respiratory difficulty HEENT: normal Neck: supple. no JVD. Carotids 2+ bilat; no bruits. No lymphadenopathy or thryomegaly appreciated. Cor: PMI nondisplaced. Regular rate & rhythm. No rubs, gallops or murmurs. Lungs: clear Abdomen: soft, nontender, nondistended. No hepatosplenomegaly. No bruits or masses. Good bowel sounds. Extremities: no cyanosis, clubbing, rash, edema Neuro: alert & oriented x 3, cranial  nerves grossly intact. moves all 4 extremities w/o difficulty. Affect pleasant.  No results found for this or any previous visit (from the past 24 hour(s)). No results found.   ASSESSMENT & PLAN: 1. R Breast Cancer: I reviewed echos personally. EF and Doppler parameters stable.  Strain images were diffcult on today's echo but I think they look ok.  No HF on exam. Continue Herceptin (she will be on this long-term). Repeat echo in 4 months with followup. 2. HTN: BP running high, I will have her restart lisinopril 10 mg daily with BMET in 2 wks.   Madeline Blake,MD 05/08/2015

## 2015-05-08 NOTE — Patient Instructions (Signed)
Start Lisinopril 10 mg daily  We will contact you in 4 months to schedule your next appointment and echocardiogram

## 2015-05-10 ENCOUNTER — Ambulatory Visit (HOSPITAL_COMMUNITY)
Admission: RE | Admit: 2015-05-10 | Discharge: 2015-05-10 | Disposition: A | Discharge status: Home / Self Care | Payer: BLUE CROSS/BLUE SHIELD | Source: Ambulatory Visit | Attending: Hematology | Admitting: Hematology

## 2015-05-10 DIAGNOSIS — Z853 Personal history of malignant neoplasm of breast: Secondary | ICD-10-CM | POA: Insufficient documentation

## 2015-05-10 DIAGNOSIS — C50919 Malignant neoplasm of unspecified site of unspecified female breast: Secondary | ICD-10-CM

## 2015-05-10 LAB — GLUCOSE, CAPILLARY: GLUCOSE-CAPILLARY: 93 mg/dL (ref 65–99)

## 2015-05-10 MED ORDER — FLUDEOXYGLUCOSE F - 18 (FDG) INJECTION
9.6700 | Freq: Once | INTRAVENOUS | Status: AC | PRN
  Administered 2015-05-10: 9.67 via INTRAVENOUS

## 2015-05-12 ENCOUNTER — Telehealth: Payer: Self-pay | Admitting: Hematology

## 2015-05-12 ENCOUNTER — Ambulatory Visit (HOSPITAL_BASED_OUTPATIENT_CLINIC_OR_DEPARTMENT_OTHER): Payer: BLUE CROSS/BLUE SHIELD | Admitting: Hematology

## 2015-05-12 ENCOUNTER — Ambulatory Visit (HOSPITAL_BASED_OUTPATIENT_CLINIC_OR_DEPARTMENT_OTHER): Payer: BLUE CROSS/BLUE SHIELD

## 2015-05-12 ENCOUNTER — Other Ambulatory Visit (HOSPITAL_BASED_OUTPATIENT_CLINIC_OR_DEPARTMENT_OTHER): Payer: BLUE CROSS/BLUE SHIELD

## 2015-05-12 ENCOUNTER — Encounter: Payer: Self-pay | Admitting: Hematology

## 2015-05-12 VITALS — BP 167/91 | HR 90 | Temp 98.2°F | Resp 18 | Ht 64.0 in | Wt 194.2 lb

## 2015-05-12 DIAGNOSIS — F418 Other specified anxiety disorders: Secondary | ICD-10-CM

## 2015-05-12 DIAGNOSIS — C773 Secondary and unspecified malignant neoplasm of axilla and upper limb lymph nodes: Secondary | ICD-10-CM | POA: Diagnosis not present

## 2015-05-12 DIAGNOSIS — Z5112 Encounter for antineoplastic immunotherapy: Secondary | ICD-10-CM

## 2015-05-12 DIAGNOSIS — C50411 Malignant neoplasm of upper-outer quadrant of right female breast: Secondary | ICD-10-CM

## 2015-05-12 DIAGNOSIS — Z170 Estrogen receptor positive status [ER+]: Secondary | ICD-10-CM | POA: Diagnosis not present

## 2015-05-12 DIAGNOSIS — E559 Vitamin D deficiency, unspecified: Secondary | ICD-10-CM

## 2015-05-12 LAB — COMPREHENSIVE METABOLIC PANEL (CC13)
ALT: 16 U/L (ref 0–55)
AST: 17 U/L (ref 5–34)
Albumin: 3.6 g/dL (ref 3.5–5.0)
Alkaline Phosphatase: 140 U/L (ref 40–150)
Anion Gap: 13 mEq/L — ABNORMAL HIGH (ref 3–11)
BUN: 21.8 mg/dL (ref 7.0–26.0)
CALCIUM: 9.3 mg/dL (ref 8.4–10.4)
CHLORIDE: 106 meq/L (ref 98–109)
CO2: 26 mEq/L (ref 22–29)
Creatinine: 0.8 mg/dL (ref 0.6–1.1)
EGFR: 78 mL/min/{1.73_m2} — AB (ref >90)
GLUCOSE: 114 mg/dL (ref 70–140)
Potassium: 3.9 mEq/L (ref 3.5–5.1)
SODIUM: 145 meq/L (ref 136–145)
TOTAL PROTEIN: 6.8 g/dL (ref 6.4–8.3)
Total Bilirubin: 0.39 mg/dL (ref 0.20–1.20)

## 2015-05-12 LAB — URINALYSIS, MICROSCOPIC - CHCC
BILIRUBIN (URINE): NEGATIVE
Blood: NEGATIVE
Glucose: NEGATIVE mg/dL
Ketones: NEGATIVE mg/dL
NITRITE: NEGATIVE
PH: 5 (ref 4.6–8.0)
Protein: NEGATIVE mg/dL
RBC / HPF: NEGATIVE (ref 0–2)
SPECIFIC GRAVITY, URINE: 1.005 (ref 1.003–1.035)
Urobilinogen, UR: 0.2 mg/dL (ref 0.2–1)

## 2015-05-12 LAB — CBC WITH DIFFERENTIAL/PLATELET
BASO%: 0.5 % (ref 0.0–2.0)
Basophils Absolute: 0 10*3/uL (ref 0.0–0.1)
EOS%: 2 % (ref 0.0–7.0)
Eosinophils Absolute: 0.1 10*3/uL (ref 0.0–0.5)
HCT: 38.5 % (ref 34.8–46.6)
HGB: 12.7 g/dL (ref 11.6–15.9)
LYMPH%: 16.1 % (ref 14.0–49.7)
MCH: 28.3 pg (ref 25.1–34.0)
MCHC: 33.1 g/dL (ref 31.5–36.0)
MCV: 85.5 fL (ref 79.5–101.0)
MONO#: 0.3 10*3/uL (ref 0.1–0.9)
MONO%: 5.3 % (ref 0.0–14.0)
NEUT#: 5 10*3/uL (ref 1.5–6.5)
NEUT%: 76.1 % (ref 38.4–76.8)
Platelets: 227 10*3/uL (ref 145–400)
RBC: 4.5 10*6/uL (ref 3.70–5.45)
RDW: 14 % (ref 11.2–14.5)
WBC: 6.5 10*3/uL (ref 3.9–10.3)
lymph#: 1.1 10*3/uL (ref 0.9–3.3)

## 2015-05-12 MED ORDER — ERGOCALCIFEROL 1.25 MG (50000 UT) PO CAPS: 50000.0000 [IU] | ORAL_CAPSULE | ORAL | Status: DC

## 2015-05-12 MED ORDER — TRASTUZUMAB CHEMO INJECTION 440 MG
6.0000 mg/kg | Freq: Once | INTRAVENOUS | Status: AC
  Administered 2015-05-12: 525 mg via INTRAVENOUS
  Filled 2015-05-12: qty 25

## 2015-05-12 MED ORDER — SODIUM CHLORIDE 0.9 % IJ SOLN
10.0000 mL | INTRAMUSCULAR | Status: DC | PRN
  Administered 2015-05-12: 10 mL
  Filled 2015-05-12: qty 10

## 2015-05-12 MED ORDER — SODIUM CHLORIDE 0.9 % IV SOLN
Freq: Once | INTRAVENOUS | Status: AC
  Administered 2015-05-12: 10:00:00 via INTRAVENOUS

## 2015-05-12 MED ORDER — ACETAMINOPHEN 325 MG PO TABS
650.0000 mg | ORAL_TABLET | Freq: Once | ORAL | Status: AC
  Administered 2015-05-12: 650 mg via ORAL

## 2015-05-12 MED ORDER — DIPHENHYDRAMINE HCL 25 MG PO CAPS
25.0000 mg | ORAL_CAPSULE | Freq: Once | ORAL | Status: AC
  Administered 2015-05-12: 25 mg via ORAL

## 2015-05-12 MED ORDER — DIPHENHYDRAMINE HCL 25 MG PO CAPS
ORAL_CAPSULE | ORAL | Status: AC
  Filled 2015-05-12: qty 1

## 2015-05-12 MED ORDER — HEPARIN SOD (PORK) LOCK FLUSH 100 UNIT/ML IV SOLN
500.0000 [IU] | Freq: Once | INTRAVENOUS | Status: AC | PRN
  Administered 2015-05-12: 500 [IU]
  Filled 2015-05-12: qty 5

## 2015-05-12 MED ORDER — ACETAMINOPHEN 325 MG PO TABS
ORAL_TABLET | ORAL | Status: AC
Start: 2015-05-12 — End: 2015-05-12
  Filled 2015-05-12: qty 2

## 2015-05-12 NOTE — Telephone Encounter (Signed)
Gave adn printed appt sched and avs fo rpt for June thru Aug °

## 2015-05-12 NOTE — Patient Instructions (Signed)
[facility] Discharge Instructions for Patients Receiving Chemotherapy  Today you received the following chemotherapy agents:  Herceptin  To help prevent nausea and vomiting after your treatment, we encourage you to take your nausea medication as prescribed.   If you develop nausea and vomiting that is not controlled by your nausea medication, call the clinic.   BELOW ARE SYMPTOMS THAT SHOULD BE REPORTED IMMEDIATELY:  *FEVER GREATER THAN 100.5 F  *CHILLS WITH OR WITHOUT FEVER  NAUSEA AND VOMITING THAT IS NOT CONTROLLED WITH YOUR NAUSEA MEDICATION  *UNUSUAL SHORTNESS OF BREATH  *UNUSUAL BRUISING OR BLEEDING  TENDERNESS IN MOUTH AND THROAT WITH OR WITHOUT PRESENCE OF ULCERS  *URINARY PROBLEMS  *BOWEL PROBLEMS  UNUSUAL RASH Items with * indicate a potential emergency and should be followed up as soon as possible.  Feel free to call the clinic you have any questions or concerns. The clinic phone number is [phone].  Please show the CHEMO ALERT CARD at check-in to the Emergency Department and triage nurse.   

## 2015-05-12 NOTE — Progress Notes (Signed)
[facility]  [phone] [phone]  Clinic Follow up Note   Patient Care Team: [name], MD as PCP - General (Nurse Practitioner) 05/12/2015  SUMMARY OF ONCOLOGIC HISTORY:  Oncology History   Breast cancer of upper-outer quadrant of right female breast   Staging form: Breast, AJCC 7th Edition     Clinical: Stage IV (T2, N1, M1) - Signed by Truitt Merle, MD on 12/29/2014       Prognostic indicators: ER/PR/HER2NEU +      Pathologic: No stage assigned - Unsigned       Prognostic indicators: ER/PR/HER2NEU +        Breast cancer of upper-outer quadrant of right female breast   01/03/2014 Initial Diagnosis Breast cancer of upper-outer quadrant of right female breast, IDA, grade 2, ER 90%+, PR 1%+, her2 (+)   01/20/2014 PET scan Hypermetabolic mass in the posterior right breast (primary), 2 additional nodules in deep right breast (likely nodes) and 3 hyermetabolic portal nodes (lasrgest 2cm, SUV 13).    02/03/2014 - 05/20/2014 Neo-Adjuvant Chemotherapy docetaxel, Taxotere, Carboplatin, Herceptin, Perjeta for 6 cycles    06/01/2014 PET scan Minimal residual hypermetabolic activity within the residual mass inferolaterally in the right breast, likely surgical change. No  other hypermetabolic node or lesion.    06/10/2014 -  Anti-estrogen oral therapy Anastrozole 1 mg once daily   06/10/2014 -  Chemotherapy Maintenance Herceptin every 3 weeks   06/22/2014 Surgery right mastectomy with negative margines and axillary node dissection. ypT0N0, complete patholigical resposne.    05/10/2015 Imaging No convincing evidence of hypermetabolic metastatic disease.Right level 2 node is decreased in size and hypermetabolism. Favored to be reactive.    CURRENT THERAPY:  1.Trastuzumab 6 mg/kg every 3 weeks indefinitely 2. Anastrozole 1 mg once daily  INTERVAL HISTORY: Madeline Davidson returns for follow-up. She is doing well overall. She tolerates trastuzumab infusion and anastrozole  very well without significant side effects. Recent echo showed normal EF. She has been following up with cardiology also. She noticed moderate dysuria in the past week, no fever, abdominal pain or hematuria. No other new complaints. She works full-time, has good energy and appetite.  REVIEW OF SYSTEMS:   Constitutional: Denies fevers, chills or abnormal weight loss Eyes: Denies blurriness of vision Ears, nose, mouth, throat, and face: Denies mucositis or sore throat Respiratory: Denies cough, dyspnea or wheezes Cardiovascular: Denies palpitation, chest discomfort or lower extremity swelling Gastrointestinal:  Denies nausea, heartburn or change in bowel habits Skin: Denies abnormal skin rashes Lymphatics: Denies new lymphadenopathy or easy bruising Neurological:Denies numbness, tingling or new weaknesses Behavioral/Psych: Mood is stable, no new changes  All other systems were reviewed with the patient and are negative.  MEDICAL HISTORY:  Past Medical History  Diagnosis Date  . Allergy   . Wears glasses   . 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 PONV (postoperative nausea and vomiting)   . Anxiety   . History of blood transfusion ~ 04/2014    "related to chemo"  . Anemia ~ 04/2014  . Arthritis     back    SURGICAL HISTORY: Past Surgical History  Procedure Laterality Date  . Colon resection  2004    "for abscess on my colon"  . Hernia repair  2005    umb  . Cholecystectomy  2005  . Portacath placement Left 01/31/2014    Procedure: INSERTION PORT-A-CATH;  Surgeon: Rolm Bookbinder, MD;  Location: [facility];  Service: General;  Laterality: Left;  . Axillary lymph node biopsy Right 12/2013  . Breast biopsy Right 12/2013  . Colon surgery    . Mastectomy w/ sentinel node biopsy Right 06/22/2014    Procedure: RIGHT TOTAL MASTECTOMY WITH SENTINEL LYMPH NODE BIOPSY;  Surgeon: Rolm Bookbinder, MD;  Location: [facility];  Service: General;   Laterality: Right;    I have reviewed the social history and family history with the patient and they are unchanged from previous note.  ALLERGIES:  is allergic to ciprofloxacin.  MEDICATIONS:  Current Outpatient Prescriptions  Medication Sig Dispense Refill  . acetaminophen (TYLENOL) 500 MG tablet Take 1,000 mg by mouth every 6 (six) hours as needed for mild pain.    Marland Kitchen anastrozole (ARIMIDEX) 1 MG tablet Take 1 tablet (1 mg total) by mouth daily. 90 tablet 4  . cetirizine (ZYRTEC) 10 MG tablet Take 10 mg by mouth daily as needed for allergies.    . clonazePAM (KLONOPIN) 0.5 MG tablet Take 1 tablet (0.5 mg total) by mouth 2 (two) times daily as needed for anxiety. 30 tablet 0  . ibuprofen (ADVIL,MOTRIN) 800 MG tablet Take 1 tablet (800 mg total) by mouth every 8 (eight) hours as needed for moderate pain. 90 tablet 2  . lidocaine-prilocaine (EMLA) cream Apply 1 application topically as needed (for infusion site).    Marland Kitchen zolpidem (AMBIEN CR) 12.5 MG CR tablet Take 1 tablet (12.5 mg total) by mouth at bedtime as needed for sleep. 30 tablet 3  . ALPRAZolam (XANAX) 0.25 MG tablet Take 1 tablet (0.25 mg total) by mouth 3 (three) times daily as needed for anxiety. (Patient not taking: Reported on 05/08/2015) 30 tablet 0  . lisinopril (PRINIVIL,ZESTRIL) 10 MG tablet Take 1 tablet (10 mg total) by mouth daily. (Patient not taking: Reported on 05/12/2015) 90 tablet 3   No current facility-administered medications for this visit.   Facility-Administered Medications Ordered in Other Visits  Medication Dose Route Frequency Provider Last Rate Last Dose  . heparin lock flush 100 unit/mL  500 Units Intracatheter Once PRN Truitt Merle, MD      . sodium chloride 0.9 % injection 10 mL  10 mL Intracatheter PRN Truitt Merle, MD      . trastuzumab (HERCEPTIN) 525 mg in sodium chloride 0.9 % 250 mL chemo infusion  6 mg/kg (Treatment Plan Actual) Intravenous Once Truitt Merle, MD        PHYSICAL EXAMINATION: ECOG PERFORMANCE  STATUS: 0 - Asymptomatic  Filed Vitals:   05/12/15 0835  BP: 167/91  Pulse: 90  Temp: 98.2 F (36.8 C)  Resp: 18   Filed Weights   05/12/15 0835  Weight: 194 lb 3.2 oz (88.089 kg)    GENERAL:alert, no distress and comfortable SKIN: skin color, texture, turgor are normal, no rashes or significant lesions EYES: normal, Conjunctiva are pink and non-injected, sclera clear OROPHARYNX:no exudate, no erythema and lips, buccal mucosa, and tongue normal  NECK: supple, thyroid normal size, non-tender, without nodularity LYMPH:  no palpable lymphadenopathy in the cervical, axillary or inguinal LUNGS: clear to auscultation and percussion with normal breathing effort HEART: regular rate & rhythm and no murmurs and no lower extremity edema ABDOMEN:abdomen soft, non-tender and normal bowel sounds Musculoskeletal:no cyanosis of digits and no clubbing  NEURO: alert & oriented x 3 with fluent speech, no focal motor/sensory deficits Right breast surgically absent,  no palpable lesions on chest wall or axilla note. Left breast exam no palpable mass, skin change or nipple large.  LABORATORY DATA:  I have reviewed the data as listed CBC Latest Ref Rng 05/12/2015 04/21/2015 03/31/2015  WBC 3.9 - 10.3 10e3/uL 6.5 5.7 5.9  Hemoglobin 11.6 - 15.9 g/dL 12.7 12.7 12.8  Hematocrit 34.8 - 46.6 % 38.5 38.5 39.4  Platelets 145 - 400 10e3/uL 227 243 227     CMP Latest Ref Rng 05/12/2015 04/21/2015 03/31/2015  Glucose 70 - 140 mg/dl 114 125 118  BUN 7.0 - 26.0 mg/dL 21.8 19.6 18.3  Creatinine 0.6 - 1.1 mg/dL 0.8 0.9 0.8  Sodium 136 - 145 mEq/L 145 144 144  Potassium 3.5 - 5.1 mEq/L 3.9 4.0 4.1  Chloride 96 - 112 mEq/L - - -  CO2 22 - 29 mEq/L $Remove'26 27 26  'rpQHaqH$ Calcium 8.4 - 10.4 mg/dL 9.3 9.7 9.4  Total Protein 6.4 - 8.3 g/dL 6.8 6.9 6.7  Total Bilirubin 0.20 - 1.20 mg/dL 0.39 0.47 0.51  Alkaline Phos 40 - 150 U/L 140 137 138  AST 5 - 34 U/L $Remo'17 17 12  'AMOKP$ ALT 0 - 55 U/L $Remo'16 17 12      'pfHNv$ RADIOGRAPHIC STUDIES: I have  personally reviewed the radiological images as listed and agreed with the findings in the report.  PET/CT on 05/10/2015 IMPRESSION: 1. No convincing evidence of hypermetabolic metastatic disease. 2. Right level 2 node is decreased in size and hypermetabolism. Favored to be reactive. 3. Right mastectomy with decreased hypermetabolism about the right lateral chest wall. Consider physical exam correlation. 4. Posterior anal hypermetabolism is similar to slightly decreased and warrants physical exam correlation.                *[facility]*            [address] [place], [place] [address]              [phone]  ------------------------------------------------------------------- Transthoracic Echocardiography  Patient:  Madeline Davidson, Madeline Davidson MR #:    [id number] Study Date: 05/08/2015 Gender:   F Age:    3 Height:   162.6 cm Weight:   88 kg BSA:    2.03 m^2 Pt. Status: Room:  SONOGRAPHER Johny Chess, RDCS, CCT ORDERING   Bensimhon, Sterling Big, Outpatient ATTENDING  [name]  cc:  ------------------------------------------------------------------- LV EF: 60%  ------------------------------------------------------------------- History:  PMH: Breast Cancer. 174.9.  ------------------------------------------------------------------- Study Conclusions  - Left ventricle: The cavity size was normal. Wall thickness was normal. The estimated ejection fraction was 60%. Although no diagnostic regional wall motion abnormality was identified, this possibility cannot be completely excluded on the basis of this study. Doppler parameters are consistent with abnormal left ventricular relaxation (grade 1 diastolic dysfunction). Lateral s&' 12.9 cm/sec and GLS -19%. Both of these measures  were difficult due to poor image quality. - Aortic valve: There was no stenosis. - Mitral valve: There was no significant regurgitation. - Right ventricle: The cavity size was normal. Systolic function was normal. - Pulmonary arteries: No complete TR doppler jet so unable to estimate PA systolic pressure. - Inferior vena cava: The vessel was normal in size. The respirophasic diameter changes were in the normal range (>= 50%), consistent with normal central venous pressure.  Echo 05/08/2015 Impressions:  - Technically difficult study with poor acoustic windows. Normal LV size and systolic function, EF 21%. Strain and lateral s&' as above. Normal RV size and systolic function. No significant valvular  abnormalities.  ASSESSMENT AND PLAN  59 year old postmenopausal woman, was found to have a 3.3 right breast mass and a positive axillary lymph nodes, biopsy confirmed invasive ductal carcinoma, ER positive PR positive HER-2 positive.  Her initial PET scan also reviewed 3 hypermetabolic nodes in periportal, measuring up to 2 cm with SUV 14. The periportal node was not bopsied, but likely represented metastatic nodes. She had completed response to neoadjuvant chemotherapy and underwent right mastectomy.  1. Metastatic right breast cancer to periportal nodes, ER+/PR+/HER2+ -Her case was reviewed in our tumor board this week. Her initial and a post treatment PET scan was reviewed again. Due to the size and metabolic activity of those periportal lymph nodes at diagnosis, she likely has metastatic breast cancer. And the consensus was to continue therapy indefinitely. -Also She had excellent compete response to chemotherapy, She is at VERY high risk for cancer recurrence due to the nature of metastatic disease. We recommend her to continue maintenance Herceptin and anastrozole indefinitely. She agrees with the plan.  -Her recent restaging PET scan showed no evidence of disease. This  was reviewed with her in person. -Continue monitor her ECHO every 3-4 months when she is on Herceptin therapy, she is being followed by cardiologist on this, and has recently started with lisinopril for her hypertension. -We discussed the risk of osteoporocess when she is on anastrozole, she will continue calcium and vitamin D supplements. We'll obtain a bone density scan. Her last was in 2012.  -I suggest her to take Tylenol and ibuprofen alternatively for her arthralgia. -I encouraged her to continue exercise and remain to be active. -She is clinically doing well well, we'll continue Herceptin maintenance therapy indefinitely. We'll see her every 6 weeks. -Next restaging scan in 5-6 months.  2. Hypermetabolic right cervical level II lymph node -This was likely related to her recent sinusitis, less likely metastatic lesion, giving no cervical or other adenopathy on PET scan. -Present PET scan showed decreased metabolic activity. No other new adenopathy.   3. Anxiety and depression -She is on low-dose clonazepam, for trouble with sleeping and anxiety, but feels not very effective.  -will switch her to xanax, prescription given today   -She tried zoloft and lexapro but could not tolerated.   4. Anemia, likely secondary to chemotherapy. -Resolved  5. Vit D deficiency -Her vitamin D level was 10 on 03/10/2015 -We'll given her Vit D 50,000u weekly x6 weeks then 1000u daily    Plan -Return to clinic in 6 weeks with APP or me  -Vit D prescription was sent today  ll questions were answered. The patient knows to call the clinic with any problems, questions or concerns. No barriers to learning was detected.  I spent 20 minutes counseling the patient face to face. The total time spent in the appointment was 25 minutes and more than 50% was on counseling and review of test results   Truitt Merle, MD 05/12/2015

## 2015-05-13 LAB — URINE CULTURE

## 2015-05-15 ENCOUNTER — Telehealth: Payer: Self-pay | Admitting: *Deleted

## 2015-05-15 NOTE — Telephone Encounter (Signed)
Called pt's mobile # & message left to call back to let us know if she is still having UTI symptoms.  If so Dr Burr Medico is willing to call in cipro 500 mg bid x 3 days.  Dr. Burr Medico reports that u/a was neg. Culture positive.

## 2015-05-16 NOTE — Telephone Encounter (Signed)
Reached pt today & she states that she has occasional burning but not bad.  She denies fever.  She states she doesn't like cipro.  Discussed with Dr Burr Medico & she states to have pt drink plenty of water & cranberry juice & no ATB for now.  Pt is to call if symptoms worse.

## 2015-05-29 ENCOUNTER — Telehealth: Payer: Self-pay | Admitting: Hematology

## 2015-05-29 NOTE — Telephone Encounter (Signed)
Due to GI [facility] switch 7/1 f/u moved from [facility] to [facility] per [facility]. Start time remains the same. Spoke with patient she is aware and will get new schedule 6/10.

## 2015-06-02 ENCOUNTER — Ambulatory Visit (HOSPITAL_BASED_OUTPATIENT_CLINIC_OR_DEPARTMENT_OTHER): Payer: BLUE CROSS/BLUE SHIELD

## 2015-06-02 ENCOUNTER — Other Ambulatory Visit (HOSPITAL_BASED_OUTPATIENT_CLINIC_OR_DEPARTMENT_OTHER): Payer: BLUE CROSS/BLUE SHIELD

## 2015-06-02 VITALS — BP 138/92 | HR 77 | Temp 97.9°F

## 2015-06-02 DIAGNOSIS — C50411 Malignant neoplasm of upper-outer quadrant of right female breast: Secondary | ICD-10-CM

## 2015-06-02 DIAGNOSIS — Z5112 Encounter for antineoplastic immunotherapy: Secondary | ICD-10-CM

## 2015-06-02 LAB — CBC WITH DIFFERENTIAL/PLATELET
BASO%: 0.8 % (ref 0.0–2.0)
BASOS ABS: 0 10*3/uL (ref 0.0–0.1)
EOS%: 2 % (ref 0.0–7.0)
Eosinophils Absolute: 0.1 10*3/uL (ref 0.0–0.5)
HCT: 39.1 % (ref 34.8–46.6)
HGB: 13 g/dL (ref 11.6–15.9)
LYMPH%: 17.7 % (ref 14.0–49.7)
MCH: 28.6 pg (ref 25.1–34.0)
MCHC: 33.2 g/dL (ref 31.5–36.0)
MCV: 86.2 fL (ref 79.5–101.0)
MONO#: 0.4 10*3/uL (ref 0.1–0.9)
MONO%: 6.1 % (ref 0.0–14.0)
NEUT%: 73.4 % (ref 38.4–76.8)
NEUTROS ABS: 4.8 10*3/uL (ref 1.5–6.5)
PLATELETS: 221 10*3/uL (ref 145–400)
RBC: 4.53 10*6/uL (ref 3.70–5.45)
RDW: 14.1 % (ref 11.2–14.5)
WBC: 6.5 10*3/uL (ref 3.9–10.3)
lymph#: 1.2 10*3/uL (ref 0.9–3.3)

## 2015-06-02 LAB — COMPREHENSIVE METABOLIC PANEL (CC13)
ALBUMIN: 3.6 g/dL (ref 3.5–5.0)
ALT: 14 U/L (ref 0–55)
AST: 18 U/L (ref 5–34)
Alkaline Phosphatase: 136 U/L (ref 40–150)
Anion Gap: 12 mEq/L — ABNORMAL HIGH (ref 3–11)
BILIRUBIN TOTAL: 0.44 mg/dL (ref 0.20–1.20)
BUN: 17 mg/dL (ref 7.0–26.0)
CHLORIDE: 107 meq/L (ref 98–109)
CO2: 26 mEq/L (ref 22–29)
Calcium: 9.6 mg/dL (ref 8.4–10.4)
Creatinine: 0.8 mg/dL (ref 0.6–1.1)
EGFR: 76 mL/min/{1.73_m2} — ABNORMAL LOW (ref >90)
Glucose: 108 mg/dl (ref 70–140)
POTASSIUM: 4.3 meq/L (ref 3.5–5.1)
SODIUM: 145 meq/L (ref 136–145)
Total Protein: 6.9 g/dL (ref 6.4–8.3)

## 2015-06-02 MED ORDER — SODIUM CHLORIDE 0.9 % IV SOLN
Freq: Once | INTRAVENOUS | Status: AC
  Administered 2015-06-02: 10:00:00 via INTRAVENOUS

## 2015-06-02 MED ORDER — DIPHENHYDRAMINE HCL 25 MG PO CAPS
25.0000 mg | ORAL_CAPSULE | Freq: Once | ORAL | Status: AC
  Administered 2015-06-02: 25 mg via ORAL

## 2015-06-02 MED ORDER — ACETAMINOPHEN 325 MG PO TABS
650.0000 mg | ORAL_TABLET | Freq: Once | ORAL | Status: AC
  Administered 2015-06-02: 650 mg via ORAL

## 2015-06-02 MED ORDER — DIPHENHYDRAMINE HCL 25 MG PO CAPS
ORAL_CAPSULE | ORAL | Status: AC
  Filled 2015-06-02: qty 1

## 2015-06-02 MED ORDER — ACETAMINOPHEN 325 MG PO TABS
ORAL_TABLET | ORAL | Status: AC
  Filled 2015-06-02: qty 2

## 2015-06-02 MED ORDER — TRASTUZUMAB CHEMO INJECTION 440 MG
6.0000 mg/kg | Freq: Once | INTRAVENOUS | Status: AC
  Administered 2015-06-02: 525 mg via INTRAVENOUS
  Filled 2015-06-02: qty 25

## 2015-06-02 MED ORDER — SODIUM CHLORIDE 0.9 % IJ SOLN
10.0000 mL | INTRAMUSCULAR | Status: DC | PRN
  Administered 2015-06-02: 10 mL
  Filled 2015-06-02: qty 10

## 2015-06-02 MED ORDER — HEPARIN SOD (PORK) LOCK FLUSH 100 UNIT/ML IV SOLN
500.0000 [IU] | Freq: Once | INTRAVENOUS | Status: AC | PRN
  Administered 2015-06-02: 500 [IU]
  Filled 2015-06-02: qty 5

## 2015-06-02 NOTE — Patient Instructions (Signed)
[facility] Discharge Instructions for Patients Receiving Chemotherapy  Today you received the following chemotherapy agents Herceptin  To help prevent nausea and vomiting after your treatment, we encourage you to take your nausea medication    If you develop nausea and vomiting that is not controlled by your nausea medication, call the clinic.   BELOW ARE SYMPTOMS THAT SHOULD BE REPORTED IMMEDIATELY:  *FEVER GREATER THAN 100.5 F  *CHILLS WITH OR WITHOUT FEVER  NAUSEA AND VOMITING THAT IS NOT CONTROLLED WITH YOUR NAUSEA MEDICATION  *UNUSUAL SHORTNESS OF BREATH  *UNUSUAL BRUISING OR BLEEDING  TENDERNESS IN MOUTH AND THROAT WITH OR WITHOUT PRESENCE OF ULCERS  *URINARY PROBLEMS  *BOWEL PROBLEMS  UNUSUAL RASH Items with * indicate a potential emergency and should be followed up as soon as possible.  Feel free to call the clinic you have any questions or concerns. The clinic phone number is [phone].  Please show the CHEMO ALERT CARD at check-in to the Emergency Department and triage nurse.   

## 2015-06-22 ENCOUNTER — Other Ambulatory Visit: Payer: Self-pay | Admitting: Hematology

## 2015-06-23 ENCOUNTER — Ambulatory Visit (HOSPITAL_BASED_OUTPATIENT_CLINIC_OR_DEPARTMENT_OTHER): Payer: BLUE CROSS/BLUE SHIELD | Admitting: Physician Assistant

## 2015-06-23 ENCOUNTER — Encounter: Payer: Self-pay | Admitting: Physician Assistant

## 2015-06-23 ENCOUNTER — Ambulatory Visit (HOSPITAL_BASED_OUTPATIENT_CLINIC_OR_DEPARTMENT_OTHER): Payer: BLUE CROSS/BLUE SHIELD

## 2015-06-23 ENCOUNTER — Other Ambulatory Visit (HOSPITAL_BASED_OUTPATIENT_CLINIC_OR_DEPARTMENT_OTHER): Payer: BLUE CROSS/BLUE SHIELD

## 2015-06-23 ENCOUNTER — Other Ambulatory Visit: Payer: Self-pay | Admitting: Hematology

## 2015-06-23 VITALS — BP 161/78 | HR 99 | Temp 98.5°F | Resp 18 | Ht 64.0 in | Wt 192.5 lb

## 2015-06-23 DIAGNOSIS — C50411 Malignant neoplasm of upper-outer quadrant of right female breast: Secondary | ICD-10-CM

## 2015-06-23 DIAGNOSIS — C773 Secondary and unspecified malignant neoplasm of axilla and upper limb lymph nodes: Secondary | ICD-10-CM

## 2015-06-23 DIAGNOSIS — F419 Anxiety disorder, unspecified: Secondary | ICD-10-CM

## 2015-06-23 DIAGNOSIS — Z170 Estrogen receptor positive status [ER+]: Secondary | ICD-10-CM | POA: Diagnosis not present

## 2015-06-23 DIAGNOSIS — Z5112 Encounter for antineoplastic immunotherapy: Secondary | ICD-10-CM | POA: Diagnosis not present

## 2015-06-23 DIAGNOSIS — F329 Major depressive disorder, single episode, unspecified: Secondary | ICD-10-CM

## 2015-06-23 LAB — CBC WITH DIFFERENTIAL/PLATELET
BASO%: 0.3 % (ref 0.0–2.0)
BASOS ABS: 0 10*3/uL (ref 0.0–0.1)
EOS%: 1.3 % (ref 0.0–7.0)
Eosinophils Absolute: 0.1 10*3/uL (ref 0.0–0.5)
HCT: 37.8 % (ref 34.8–46.6)
HGB: 12.6 g/dL (ref 11.6–15.9)
LYMPH%: 16.2 % (ref 14.0–49.7)
MCH: 29.2 pg (ref 25.1–34.0)
MCHC: 33.3 g/dL (ref 31.5–36.0)
MCV: 87.7 fL (ref 79.5–101.0)
MONO#: 0.4 10*3/uL (ref 0.1–0.9)
MONO%: 5.3 % (ref 0.0–14.0)
NEUT#: 5.3 10*3/uL (ref 1.5–6.5)
NEUT%: 76.9 % — ABNORMAL HIGH (ref 38.4–76.8)
Platelets: 202 10*3/uL (ref 145–400)
RBC: 4.31 10*6/uL (ref 3.70–5.45)
RDW: 13.6 % (ref 11.2–14.5)
WBC: 6.9 10*3/uL (ref 3.9–10.3)
lymph#: 1.1 10*3/uL (ref 0.9–3.3)

## 2015-06-23 LAB — COMPREHENSIVE METABOLIC PANEL (CC13)
ALK PHOS: 143 U/L (ref 40–150)
ALT: 18 U/L (ref 0–55)
AST: 16 U/L (ref 5–34)
Albumin: 3.6 g/dL (ref 3.5–5.0)
Anion Gap: 10 mEq/L (ref 3–11)
BILIRUBIN TOTAL: 0.43 mg/dL (ref 0.20–1.20)
BUN: 18.3 mg/dL (ref 7.0–26.0)
CHLORIDE: 107 meq/L (ref 98–109)
CO2: 25 meq/L (ref 22–29)
Calcium: 9.6 mg/dL (ref 8.4–10.4)
Creatinine: 0.9 mg/dL (ref 0.6–1.1)
EGFR: 69 mL/min/{1.73_m2} — AB (ref >90)
Glucose: 133 mg/dl (ref 70–140)
POTASSIUM: 4.1 meq/L (ref 3.5–5.1)
Sodium: 143 mEq/L (ref 136–145)
TOTAL PROTEIN: 6.7 g/dL (ref 6.4–8.3)

## 2015-06-23 MED ORDER — ACETAMINOPHEN 325 MG PO TABS
ORAL_TABLET | ORAL | Status: AC
  Filled 2015-06-23: qty 2

## 2015-06-23 MED ORDER — SODIUM CHLORIDE 0.9 % IJ SOLN
10.0000 mL | INTRAMUSCULAR | Status: DC | PRN
  Administered 2015-06-23: 10 mL
  Filled 2015-06-23: qty 10

## 2015-06-23 MED ORDER — HEPARIN SOD (PORK) LOCK FLUSH 100 UNIT/ML IV SOLN
500.0000 [IU] | Freq: Once | INTRAVENOUS | Status: AC | PRN
  Administered 2015-06-23: 500 [IU]
  Filled 2015-06-23: qty 5

## 2015-06-23 MED ORDER — DIPHENHYDRAMINE HCL 25 MG PO CAPS
ORAL_CAPSULE | ORAL | Status: AC
  Filled 2015-06-23: qty 1

## 2015-06-23 MED ORDER — DIPHENHYDRAMINE HCL 25 MG PO CAPS
25.0000 mg | ORAL_CAPSULE | Freq: Once | ORAL | Status: AC
  Administered 2015-06-23: 25 mg via ORAL

## 2015-06-23 MED ORDER — TRASTUZUMAB CHEMO INJECTION 440 MG
6.0000 mg/kg | Freq: Once | INTRAVENOUS | Status: AC
  Administered 2015-06-23: 525 mg via INTRAVENOUS
  Filled 2015-06-23: qty 25

## 2015-06-23 MED ORDER — ACETAMINOPHEN 325 MG PO TABS
650.0000 mg | ORAL_TABLET | Freq: Once | ORAL | Status: AC
  Administered 2015-06-23: 650 mg via ORAL

## 2015-06-23 MED ORDER — SODIUM CHLORIDE 0.9 % IV SOLN
Freq: Once | INTRAVENOUS | Status: AC
  Administered 2015-06-23: 10:00:00 via INTRAVENOUS

## 2015-06-23 NOTE — Patient Instructions (Signed)
[facility] Discharge Instructions for Patients  Today you received the following: Herceptin   To help prevent nausea and vomiting after your treatment, we encourage you to take your nausea medication as directed.   If you develop nausea and vomiting that is not controlled by your nausea medication, call the clinic.   BELOW ARE SYMPTOMS THAT SHOULD BE REPORTED IMMEDIATELY:  *FEVER GREATER THAN 100.5 F  *CHILLS WITH OR WITHOUT FEVER  NAUSEA AND VOMITING THAT IS NOT CONTROLLED WITH YOUR NAUSEA MEDICATION  *UNUSUAL SHORTNESS OF BREATH  *UNUSUAL BRUISING OR BLEEDING  TENDERNESS IN MOUTH AND THROAT WITH OR WITHOUT PRESENCE OF ULCERS  *URINARY PROBLEMS  *BOWEL PROBLEMS  UNUSUAL RASH Items with * indicate a potential emergency and should be followed up as soon as possible.  Feel free to call the clinic you have any questions or concerns. The clinic phone number is [phone].  Please show the CHEMO ALERT CARD at check-in to the Emergency Department and triage nurse.   

## 2015-06-23 NOTE — Progress Notes (Signed)
[place]  Telephone:([phone]) [phone] [phone]) [phone]  Clinic Follow up Note   Patient Care Team: [name], MD as PCP - General (Nurse Practitioner) 06/23/2015  SUMMARY OF ONCOLOGIC HISTORY:  Oncology History   Breast cancer of upper-outer quadrant of right female breast   Staging form: Breast, AJCC 7th Edition     Clinical: Stage IV (T2, N1, M1) - Signed by Truitt Merle, MD on 12/29/2014       Prognostic indicators: ER/PR/HER2NEU +      Pathologic: No stage assigned - Unsigned       Prognostic indicators: ER/PR/HER2NEU +        Breast cancer of upper-outer quadrant of right female breast   01/03/2014 Initial Diagnosis Breast cancer of upper-outer quadrant of right female breast, IDA, grade 2, ER 90%+, PR 1%+, her2 (+)   01/20/2014 PET scan Hypermetabolic mass in the posterior right breast (primary), 2 additional nodules in deep right breast (likely nodes) and 3 hyermetabolic portal nodes (lasrgest 2cm, SUV 13).    02/03/2014 - 05/20/2014 Neo-Adjuvant Chemotherapy docetaxel, Taxotere, Carboplatin, Herceptin, Perjeta for 6 cycles    06/01/2014 PET scan Minimal residual hypermetabolic activity within the residual mass inferolaterally in the right breast, likely surgical change. No  other hypermetabolic node or lesion.    06/10/2014 -  Anti-estrogen oral therapy Anastrozole 1 mg once daily   06/10/2014 -  Chemotherapy Maintenance Herceptin every 3 weeks   06/22/2014 Surgery right mastectomy with negative margines and axillary node dissection. [id number], complete patholigical resposne.    05/10/2015 Imaging No convincing evidence of hypermetabolic metastatic disease.Right level 2 node is decreased in size and hypermetabolism. Favored to be reactive.    CURRENT THERAPY:  1.Trastuzumab 6 mg/kg every 3 weeks indefinitely 2. Anastrozole 1 mg once daily  INTERVAL HISTORY: Madeline Davidson returns for follow-up. She is doing well overall. She tolerates trastuzumab infusion and anastrozole  very well without significant side effects. Recent echo ( May 16,2016)showed normal EF. She has been following up with cardiology also. She reports occasional constipation but manages this well by eating prunes. She reports increased stress at work. She states she had some bruising at her port a cath site after the last infusion. This has resolved. No other new complaints. She works full-time, has good energy and appetite.  REVIEW OF SYSTEMS:   Constitutional: Denies fevers, chills or abnormal weight loss Eyes: Denies blurriness of vision Ears, nose, mouth, throat, and face: Denies mucositis or sore throat Respiratory: Denies cough, dyspnea or wheezes Cardiovascular: Denies palpitation, chest discomfort or lower extremity swelling Gastrointestinal:  Denies nausea, heartburn or change in bowel habits Skin: Denies abnormal skin rashes Lymphatics: Denies new lymphadenopathy or easy bruising Neurological:Denies numbness, tingling or new weaknesses Behavioral/Psych: Mood is stable, no new changes  All other systems were reviewed with the patient and are negative.  MEDICAL HISTORY:  Past Medical History  Diagnosis Date  . Allergy   . Wears glasses   . 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 PONV (postoperative nausea and vomiting)   . Anxiety   . History of blood transfusion ~ 04/2014    "related to chemo"  . Anemia ~ 04/2014  . Arthritis     back    SURGICAL HISTORY: Past Surgical History  Procedure Laterality Date  . Colon resection  2004    "for abscess on my colon"  . Hernia repair  2005    umb  .  Cholecystectomy  2005  . Portacath placement Left 01/31/2014    Procedure: INSERTION PORT-A-CATH;  Surgeon: Rolm Bookbinder, MD;  Location: [place];  Service: General;  Laterality: Left;  . Axillary lymph node biopsy Right 12/2013  . Breast biopsy Right 12/2013  . Colon surgery    . Mastectomy w/ sentinel node biopsy Right 06/22/2014     Procedure: RIGHT TOTAL MASTECTOMY WITH SENTINEL LYMPH NODE BIOPSY;  Surgeon: Rolm Bookbinder, MD;  Location: [place];  Service: General;  Laterality: Right;    I have reviewed the social history and family history with the patient and they are unchanged from previous note.  ALLERGIES:  is allergic to ciprofloxacin.  MEDICATIONS:  Current Outpatient Prescriptions  Medication Sig Dispense Refill  . acetaminophen (TYLENOL) 500 MG tablet Take 1,000 mg by mouth every 6 (six) hours as needed for mild pain.    Marland Kitchen ALPRAZolam (XANAX) 0.25 MG tablet Take 1 tablet (0.25 mg total) by mouth 3 (three) times daily as needed for anxiety. 30 tablet 0  . anastrozole (ARIMIDEX) 1 MG tablet Take 1 tablet (1 mg total) by mouth daily. 90 tablet 4  . cetirizine (ZYRTEC) 10 MG tablet Take 10 mg by mouth daily as needed for allergies.    . clonazePAM (KLONOPIN) 0.5 MG tablet Take 1 tablet (0.5 mg total) by mouth 2 (two) times daily as needed for anxiety. 30 tablet 0  . ibuprofen (ADVIL,MOTRIN) 800 MG tablet Take 1 tablet (800 mg total) by mouth every 8 (eight) hours as needed for moderate pain. 90 tablet 2  . lidocaine-prilocaine (EMLA) cream APPLY EXTERNALLY TO AFFECTED AREA(S) AS NEEDED 30 g 1  . zolpidem (AMBIEN CR) 12.5 MG CR tablet Take 1 tablet (12.5 mg total) by mouth at bedtime as needed for sleep. 30 tablet 3  . ergocalciferol (DRISDOL) 50000 UNITS capsule Take 1 capsule (50,000 Units total) by mouth once a week. (Patient not taking: Reported on 06/23/2015) 6 capsule 0  . lisinopril (PRINIVIL,ZESTRIL) 10 MG tablet Take 1 tablet (10 mg total) by mouth daily. (Patient not taking: Reported on 06/23/2015) 90 tablet 3   No current facility-administered medications for this visit.    PHYSICAL EXAMINATION: ECOG PERFORMANCE STATUS: 0 - Asymptomatic  Filed Vitals:   06/23/15 0906  BP: 161/78  Pulse: 99  Temp: 98.5 F (36.9 C)  Resp: 18   Filed Weights   06/23/15 0906  Weight: 192 lb 8 oz (87.317 kg)     GENERAL:alert, no distress and comfortable SKIN: skin color, texture, turgor are normal, no rashes or significant lesions EYES: normal, Conjunctiva are pink and non-injected, sclera clear OROPHARYNX:no exudate, no erythema and lips, buccal mucosa, and tongue normal  NECK: supple, thyroid normal size, non-tender, without nodularity LYMPH:  no palpable lymphadenopathy in the cervical, axillary or inguinal LUNGS: clear to auscultation and percussion with normal breathing effort HEART: regular rate & rhythm and no murmurs and no lower extremity edema ABDOMEN:abdomen soft, non-tender and normal bowel sounds Musculoskeletal:no cyanosis of digits and no clubbing  NEURO: alert & oriented x 3 with fluent speech, no focal motor/sensory deficits Right breast surgically absent,  no palpable lesions on chest wall or axilla note. Left breast exam no palpable mass, skin change or nipple large.  LABORATORY DATA:  I have reviewed the data as listed CBC Latest Ref Rng 06/23/2015 06/02/2015 05/12/2015  WBC 3.9 - 10.3 10e3/uL 6.9 6.5 6.5  Hemoglobin 11.6 - 15.9 g/dL 12.6 13.0 12.7  Hematocrit 34.8 - 46.6 % 37.8 39.1 38.5  Platelets 145 - 400 10e3/uL 202 221 227     CMP Latest Ref Rng 06/23/2015 06/02/2015 05/12/2015  Glucose 70 - 140 mg/dl 133 108 114  BUN 7.0 - 26.0 mg/dL 18.3 17.0 21.8  Creatinine 0.6 - 1.1 mg/dL 0.9 0.8 0.8  Sodium 136 - 145 mEq/L 143 145 145  Potassium 3.5 - 5.1 mEq/L 4.1 4.3 3.9  Chloride 96 - 112 mEq/L - - -  CO2 22 - 29 mEq/L _0 Calcium 8.4 - 10.4 mg/dL 9.6 9.6 9.3  Total Protein 6.4 - 8.3 g/dL 6.7 6.9 6.8  Total Bilirubin 0.20 - 1.20 mg/dL 0.43 0.44 0.39  Alkaline Phos 40 - 150 U/L 143 136 140  AST 5 - 34 U/L _1 ALT 0 - 55 U/L _2 RADIOGRAPHIC STUDIES: I have personally reviewed the radiological images as listed and agreed with the findings in the report.  PET/CT on 05/10/2015 IMPRESSION: 1. No convincing evidence of hypermetabolic metastatic  disease. 2. Right level 2 node is decreased in size and hypermetabolism. Favored to be reactive. 3. Right mastectomy with decreased hypermetabolism about the right lateral chest wall. Consider physical exam correlation. 4. Posterior anal hypermetabolism is similar to slightly decreased and warrants physical exam correlation.                *[facility]*            [place], [place] [address]              [phone]  ------------------------------------------------------------------- Transthoracic Echocardiography  Patient:  Sybella, Harnish MR #:    [id number] Study Date: 05/08/2015 Gender:   F Age:    59 Height:   162.6 cm Weight:   88 kg BSA:    2.03 m^2 Pt. Status: Room:  SONOGRAPHER Johny Chess, RDCS, CCT ORDERING   Bensimhon, Sterling Big, Outpatient ATTENDING  [name]  cc:  ------------------------------------------------------------------- LV EF: 60%  ------------------------------------------------------------------- History:  PMH: Breast Cancer. 174.9.  ------------------------------------------------------------------- Study Conclusions  - Left ventricle: The cavity size was normal. Wall thickness was normal. The estimated ejection fraction was 60%. Although no diagnostic regional wall motion abnormality was identified, this possibility cannot be completely excluded on the basis of this study. Doppler parameters are consistent with abnormal left ventricular relaxation (grade 1 diastolic dysfunction). Lateral s&' 12.9 cm/sec and GLS -19%. Both of these measures were difficult due to poor image quality. - Aortic valve: There was no stenosis. - Mitral valve: There was no significant regurgitation. - Right ventricle: The cavity size was normal. Systolic  function was normal. - Pulmonary arteries: No complete TR doppler jet so unable to estimate PA systolic pressure. - Inferior vena cava: The vessel was normal in size. The respirophasic diameter changes were in the normal range (>= 50%), consistent with normal central venous pressure.  Echo 05/08/2015 Impressions:  - Technically difficult study with poor acoustic windows. Normal LV size and systolic function, EF 44%. Strain and lateral s&' as above. Normal RV size and systolic function. No significant valvular abnormalities.  ASSESSMENT AND PLAN  59 year old postmenopausal woman, was found to have a 3.3 right breast mass and a positive axillary lymph nodes, biopsy confirmed invasive ductal carcinoma, ER positive PR positive HER-2 positive.  Her initial PET scan also reviewed 3 hypermetabolic nodes  in periportal, measuring up to 2 cm with SUV 14. The periportal node was not bopsied, but likely represented metastatic nodes. She had complete response to neoadjuvant chemotherapy and underwent right mastectomy.  1. Metastatic right breast cancer to periportal nodes, ER+/PR+/HER2+ -Her case was reviewed in our tumor board. Her initial and a post treatment PET scan was reviewed again. Due to the size and metabolic activity of those periportal lymph nodes at diagnosis, she likely has metastatic breast cancer. And the consensus was to continue therapy indefinitely. -Also She had excellent compete response to chemotherapy, She is at VERY high risk for cancer recurrence due to the nature of metastatic disease. We recommend her to continue maintenance Herceptin and anastrozole indefinitely. She agrees with the plan.  -Her recent restaging PET scan showed no evidence of disease. This was reviewed with her in person. -Continue monitor her ECHO every 3-4 months when she is on Herceptin therapy, she is being followed by cardiologist on this, and has recently started with lisinopril for her  hypertension. -We discussed the risk of osteoporosis when she is on anastrozole, she will continue calcium and vitamin D supplements. We'll obtain a bone density scan. Her last was in 2012.  -She may continue to take Tylenol and ibuprofen alternatively for her arthralgia. -She is encouraged to continue exercise and remain to be active. -She is clinically doing well well, we'll continue Herceptin maintenance therapy indefinitely. We'll see her every 6 weeks. -Next restaging scan in 4-5 months.  2. Hypermetabolic right cervical level II lymph node -This was likely related to her recent sinusitis, less likely metastatic lesion, giving no cervical or other adenopathy on PET scan. -Present PET scan showed decreased metabolic activity. No other new adenopathy.   3. Anxiety and depression -For trouble with sleeping and anxiety, she will continue on Xanax as prescribed by Dr. Burr Medico.   4. Anemia, likely secondary to chemotherapy. -Resolved  5. Vit D deficiency -Her vitamin D level was 10 on 03/10/2015 -We'll given her Vit D 50,000u weekly x6 weeks then 1000u daily    Plan -Return to clinic in 6 weeks with Dr. Burr Medico -Continue Vit D as prescribed.  ll questions were answered. The patient knows to call the clinic with any problems, questions or concerns. No barriers to learning was detected.  I spent 20 minutes counseling the patient face to face. The total time spent in the appointment was 25 minutes and more than 50% was on counseling and review of test results   Carlton Adam, PA-C  06/23/2015

## 2015-06-28 NOTE — Patient Instructions (Signed)
Continue labs and hemotherapy as scheduled Follow up in 6 weeks

## 2015-07-13 ENCOUNTER — Other Ambulatory Visit: Payer: Self-pay | Admitting: Hematology

## 2015-07-14 ENCOUNTER — Ambulatory Visit (HOSPITAL_BASED_OUTPATIENT_CLINIC_OR_DEPARTMENT_OTHER): Payer: BLUE CROSS/BLUE SHIELD

## 2015-07-14 ENCOUNTER — Other Ambulatory Visit (HOSPITAL_BASED_OUTPATIENT_CLINIC_OR_DEPARTMENT_OTHER): Payer: BLUE CROSS/BLUE SHIELD

## 2015-07-14 VITALS — BP 154/87 | HR 77 | Temp 98.8°F | Resp 20

## 2015-07-14 DIAGNOSIS — C50411 Malignant neoplasm of upper-outer quadrant of right female breast: Secondary | ICD-10-CM | POA: Diagnosis not present

## 2015-07-14 DIAGNOSIS — Z5112 Encounter for antineoplastic immunotherapy: Secondary | ICD-10-CM | POA: Diagnosis not present

## 2015-07-14 LAB — CBC WITH DIFFERENTIAL/PLATELET
BASO%: 0.2 % (ref 0.0–2.0)
Basophils Absolute: 0 10*3/uL (ref 0.0–0.1)
EOS ABS: 0.1 10*3/uL (ref 0.0–0.5)
EOS%: 2.2 % (ref 0.0–7.0)
HCT: 39 % (ref 34.8–46.6)
HGB: 12.8 g/dL (ref 11.6–15.9)
LYMPH%: 17.3 % (ref 14.0–49.7)
MCH: 29.2 pg (ref 25.1–34.0)
MCHC: 32.8 g/dL (ref 31.5–36.0)
MCV: 88.8 fL (ref 79.5–101.0)
MONO#: 0.3 10*3/uL (ref 0.1–0.9)
MONO%: 4.9 % (ref 0.0–14.0)
NEUT#: 4.8 10*3/uL (ref 1.5–6.5)
NEUT%: 75.4 % (ref 38.4–76.8)
Platelets: 215 10*3/uL (ref 145–400)
RBC: 4.39 10*6/uL (ref 3.70–5.45)
RDW: 13.5 % (ref 11.2–14.5)
WBC: 6.4 10*3/uL (ref 3.9–10.3)
lymph#: 1.1 10*3/uL (ref 0.9–3.3)

## 2015-07-14 LAB — COMPREHENSIVE METABOLIC PANEL (CC13)
ALT: 15 U/L (ref 0–55)
ANION GAP: 9 meq/L (ref 3–11)
AST: 13 U/L (ref 5–34)
Albumin: 3.6 g/dL (ref 3.5–5.0)
Alkaline Phosphatase: 116 U/L (ref 40–150)
BILIRUBIN TOTAL: 0.32 mg/dL (ref 0.20–1.20)
BUN: 20.6 mg/dL (ref 7.0–26.0)
CALCIUM: 9.7 mg/dL (ref 8.4–10.4)
CO2: 28 mEq/L (ref 22–29)
CREATININE: 0.8 mg/dL (ref 0.6–1.1)
Chloride: 107 mEq/L (ref 98–109)
EGFR: 77 mL/min/{1.73_m2} — AB (ref >90)
GLUCOSE: 126 mg/dL (ref 70–140)
POTASSIUM: 4.2 meq/L (ref 3.5–5.1)
SODIUM: 144 meq/L (ref 136–145)
Total Protein: 6.7 g/dL (ref 6.4–8.3)

## 2015-07-14 MED ORDER — ACETAMINOPHEN 325 MG PO TABS
ORAL_TABLET | ORAL | Status: AC
  Filled 2015-07-14: qty 2

## 2015-07-14 MED ORDER — ACETAMINOPHEN 325 MG PO TABS
650.0000 mg | ORAL_TABLET | Freq: Once | ORAL | Status: AC
  Administered 2015-07-14: 650 mg via ORAL

## 2015-07-14 MED ORDER — HEPARIN SOD (PORK) LOCK FLUSH 100 UNIT/ML IV SOLN
500.0000 [IU] | Freq: Once | INTRAVENOUS | Status: AC | PRN
  Administered 2015-07-14: 500 [IU]
  Filled 2015-07-14: qty 5

## 2015-07-14 MED ORDER — SODIUM CHLORIDE 0.9 % IV SOLN
Freq: Once | INTRAVENOUS | Status: AC
  Administered 2015-07-14: 10:00:00 via INTRAVENOUS

## 2015-07-14 MED ORDER — DIPHENHYDRAMINE HCL 25 MG PO CAPS
ORAL_CAPSULE | ORAL | Status: AC
  Filled 2015-07-14: qty 1

## 2015-07-14 MED ORDER — DIPHENHYDRAMINE HCL 25 MG PO CAPS
25.0000 mg | ORAL_CAPSULE | Freq: Once | ORAL | Status: AC
  Administered 2015-07-14: 25 mg via ORAL

## 2015-07-14 MED ORDER — SODIUM CHLORIDE 0.9 % IJ SOLN
10.0000 mL | INTRAMUSCULAR | Status: DC | PRN
  Administered 2015-07-14: 10 mL
  Filled 2015-07-14: qty 10

## 2015-07-14 MED ORDER — TRASTUZUMAB CHEMO INJECTION 440 MG
6.0000 mg/kg | Freq: Once | INTRAVENOUS | Status: AC
  Administered 2015-07-14: 525 mg via INTRAVENOUS
  Filled 2015-07-14: qty 25

## 2015-07-14 NOTE — Patient Instructions (Signed)

## 2015-07-14 NOTE — Progress Notes (Signed)
Discharged at 1110 ambulatory in no distress.

## 2015-08-04 ENCOUNTER — Encounter: Payer: Self-pay | Admitting: Hematology

## 2015-08-04 ENCOUNTER — Other Ambulatory Visit (HOSPITAL_BASED_OUTPATIENT_CLINIC_OR_DEPARTMENT_OTHER): Payer: BLUE CROSS/BLUE SHIELD

## 2015-08-04 ENCOUNTER — Telehealth: Payer: Self-pay | Admitting: Hematology

## 2015-08-04 ENCOUNTER — Ambulatory Visit (HOSPITAL_BASED_OUTPATIENT_CLINIC_OR_DEPARTMENT_OTHER): Payer: BLUE CROSS/BLUE SHIELD

## 2015-08-04 ENCOUNTER — Ambulatory Visit (HOSPITAL_BASED_OUTPATIENT_CLINIC_OR_DEPARTMENT_OTHER): Payer: BLUE CROSS/BLUE SHIELD | Admitting: Hematology

## 2015-08-04 VITALS — BP 121/79 | HR 75

## 2015-08-04 VITALS — BP 174/98 | HR 90 | Temp 98.8°F | Resp 18 | Ht 64.0 in | Wt 194.4 lb

## 2015-08-04 DIAGNOSIS — C773 Secondary and unspecified malignant neoplasm of axilla and upper limb lymph nodes: Secondary | ICD-10-CM | POA: Diagnosis not present

## 2015-08-04 DIAGNOSIS — C50411 Malignant neoplasm of upper-outer quadrant of right female breast: Secondary | ICD-10-CM | POA: Diagnosis not present

## 2015-08-04 DIAGNOSIS — Z170 Estrogen receptor positive status [ER+]: Secondary | ICD-10-CM

## 2015-08-04 DIAGNOSIS — C50919 Malignant neoplasm of unspecified site of unspecified female breast: Secondary | ICD-10-CM

## 2015-08-04 DIAGNOSIS — F419 Anxiety disorder, unspecified: Secondary | ICD-10-CM | POA: Diagnosis not present

## 2015-08-04 DIAGNOSIS — F329 Major depressive disorder, single episode, unspecified: Secondary | ICD-10-CM

## 2015-08-04 DIAGNOSIS — Z5112 Encounter for antineoplastic immunotherapy: Secondary | ICD-10-CM

## 2015-08-04 DIAGNOSIS — M899 Disorder of bone, unspecified: Secondary | ICD-10-CM

## 2015-08-04 DIAGNOSIS — D519 Vitamin B12 deficiency anemia, unspecified: Secondary | ICD-10-CM

## 2015-08-04 DIAGNOSIS — G4700 Insomnia, unspecified: Secondary | ICD-10-CM

## 2015-08-04 LAB — CBC WITH DIFFERENTIAL/PLATELET
BASO%: 0.7 % (ref 0.0–2.0)
BASOS ABS: 0.1 10*3/uL (ref 0.0–0.1)
EOS%: 2.5 % (ref 0.0–7.0)
Eosinophils Absolute: 0.2 10*3/uL (ref 0.0–0.5)
HCT: 39.9 % (ref 34.8–46.6)
HGB: 13.3 g/dL (ref 11.6–15.9)
LYMPH#: 1.2 10*3/uL (ref 0.9–3.3)
LYMPH%: 16.2 % (ref 14.0–49.7)
MCH: 28.9 pg (ref 25.1–34.0)
MCHC: 33.2 g/dL (ref 31.5–36.0)
MCV: 87 fL (ref 79.5–101.0)
MONO#: 0.4 10*3/uL (ref 0.1–0.9)
MONO%: 5 % (ref 0.0–14.0)
NEUT%: 75.6 % (ref 38.4–76.8)
NEUTROS ABS: 5.4 10*3/uL (ref 1.5–6.5)
Platelets: 208 10*3/uL (ref 145–400)
RBC: 4.59 10*6/uL (ref 3.70–5.45)
RDW: 14.3 % (ref 11.2–14.5)
WBC: 7.1 10*3/uL (ref 3.9–10.3)

## 2015-08-04 LAB — COMPREHENSIVE METABOLIC PANEL (CC13)
ALT: 15 U/L (ref 0–55)
AST: 13 U/L (ref 5–34)
Albumin: 3.7 g/dL (ref 3.5–5.0)
Alkaline Phosphatase: 128 U/L (ref 40–150)
Anion Gap: 8 mEq/L (ref 3–11)
BUN: 16.7 mg/dL (ref 7.0–26.0)
CHLORIDE: 108 meq/L (ref 98–109)
CO2: 27 mEq/L (ref 22–29)
CREATININE: 0.9 mg/dL (ref 0.6–1.1)
Calcium: 9.5 mg/dL (ref 8.4–10.4)
EGFR: 73 mL/min/{1.73_m2} — ABNORMAL LOW (ref >90)
Glucose: 143 mg/dl — ABNORMAL HIGH (ref 70–140)
Potassium: 4.2 mEq/L (ref 3.5–5.1)
SODIUM: 143 meq/L (ref 136–145)
Total Bilirubin: 0.36 mg/dL (ref 0.20–1.20)
Total Protein: 6.8 g/dL (ref 6.4–8.3)

## 2015-08-04 MED ORDER — ZOLPIDEM TARTRATE ER 12.5 MG PO TBCR: 12.5000 mg | EXTENDED_RELEASE_TABLET | Freq: Every evening | ORAL | Status: DC | PRN

## 2015-08-04 MED ORDER — DIPHENHYDRAMINE HCL 25 MG PO CAPS
ORAL_CAPSULE | ORAL | Status: AC
  Filled 2015-08-04: qty 1

## 2015-08-04 MED ORDER — TRAZODONE HCL 50 MG PO TABS
50.0000 mg | ORAL_TABLET | Freq: Every day | ORAL | Status: DC
Start: 2015-08-04 — End: 2015-08-04

## 2015-08-04 MED ORDER — TRAZODONE HCL 50 MG PO TABS: 50.0000 mg | ORAL_TABLET | Freq: Every day | ORAL | Status: DC

## 2015-08-04 MED ORDER — ACETAMINOPHEN 325 MG PO TABS
ORAL_TABLET | ORAL | Status: AC
Start: 2015-08-04 — End: 2015-08-04
  Filled 2015-08-04: qty 2

## 2015-08-04 MED ORDER — SODIUM CHLORIDE 0.9 % IV SOLN
Freq: Once | INTRAVENOUS | Status: AC
  Administered 2015-08-04: 10:00:00 via INTRAVENOUS

## 2015-08-04 MED ORDER — DIPHENHYDRAMINE HCL 25 MG PO CAPS
25.0000 mg | ORAL_CAPSULE | Freq: Once | ORAL | Status: AC
Start: 2015-08-04 — End: 2015-08-04
  Administered 2015-08-04: 25 mg via ORAL

## 2015-08-04 MED ORDER — HEPARIN SOD (PORK) LOCK FLUSH 100 UNIT/ML IV SOLN
500.0000 [IU] | Freq: Once | INTRAVENOUS | Status: AC | PRN
  Administered 2015-08-04: 500 [IU]
  Filled 2015-08-04: qty 5

## 2015-08-04 MED ORDER — SODIUM CHLORIDE 0.9 % IJ SOLN
10.0000 mL | INTRAMUSCULAR | Status: DC | PRN
  Administered 2015-08-04: 10 mL
  Filled 2015-08-04: qty 10

## 2015-08-04 MED ORDER — TRASTUZUMAB CHEMO INJECTION 440 MG
6.0000 mg/kg | Freq: Once | INTRAVENOUS | Status: AC
  Administered 2015-08-04: 525 mg via INTRAVENOUS
  Filled 2015-08-04: qty 25

## 2015-08-04 MED ORDER — ACETAMINOPHEN 325 MG PO TABS
650.0000 mg | ORAL_TABLET | Freq: Once | ORAL | Status: AC
  Administered 2015-08-04: 650 mg via ORAL

## 2015-08-04 NOTE — Telephone Encounter (Signed)
per pof tos ch pt appt-sent MW email to sch trmt-will call pt after reply °
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
## 2015-08-04 NOTE — Patient Instructions (Signed)
[facility] Discharge Instructions for Patients  Today you received the following: Herceptin   To help prevent nausea and vomiting after your treatment, we encourage you to take your nausea medication as directed.   If you develop nausea and vomiting that is not controlled by your nausea medication, call the clinic.   BELOW ARE SYMPTOMS THAT SHOULD BE REPORTED IMMEDIATELY:  *FEVER GREATER THAN 100.5 F  *CHILLS WITH OR WITHOUT FEVER  NAUSEA AND VOMITING THAT IS NOT CONTROLLED WITH YOUR NAUSEA MEDICATION  *UNUSUAL SHORTNESS OF BREATH  *UNUSUAL BRUISING OR BLEEDING  TENDERNESS IN MOUTH AND THROAT WITH OR WITHOUT PRESENCE OF ULCERS  *URINARY PROBLEMS  *BOWEL PROBLEMS  UNUSUAL RASH Items with * indicate a potential emergency and should be followed up as soon as possible.  Feel free to call the clinic you have any questions or concerns. The clinic phone number is [phone].  Please show the CHEMO ALERT CARD at check-in to the Emergency Department and triage nurse.   

## 2015-08-04 NOTE — Telephone Encounter (Signed)
per pof to sch pt appt-per reply from [facility] pt to adv of 9/2 appt time & date-spoke to pt-pt understood

## 2015-08-04 NOTE — Progress Notes (Signed)
Received faxed results of mammogram done in May 2016 from [place].  Gave results to md for review. [facility]   Phone    [phone].

## 2015-08-25 ENCOUNTER — Other Ambulatory Visit (HOSPITAL_BASED_OUTPATIENT_CLINIC_OR_DEPARTMENT_OTHER): Payer: BLUE CROSS/BLUE SHIELD

## 2015-08-25 ENCOUNTER — Ambulatory Visit (HOSPITAL_BASED_OUTPATIENT_CLINIC_OR_DEPARTMENT_OTHER): Payer: BLUE CROSS/BLUE SHIELD

## 2015-08-25 VITALS — BP 146/83 | HR 78 | Temp 98.6°F | Resp 16

## 2015-08-25 DIAGNOSIS — C50411 Malignant neoplasm of upper-outer quadrant of right female breast: Secondary | ICD-10-CM

## 2015-08-25 DIAGNOSIS — Z5112 Encounter for antineoplastic immunotherapy: Secondary | ICD-10-CM | POA: Diagnosis not present

## 2015-08-25 LAB — CBC WITH DIFFERENTIAL/PLATELET
BASO%: 0.2 % (ref 0.0–2.0)
Basophils Absolute: 0 10*3/uL (ref 0.0–0.1)
EOS%: 2.4 % (ref 0.0–7.0)
Eosinophils Absolute: 0.2 10*3/uL (ref 0.0–0.5)
HEMATOCRIT: 41.3 % (ref 34.8–46.6)
HGB: 13.6 g/dL (ref 11.6–15.9)
LYMPH#: 1.3 10*3/uL (ref 0.9–3.3)
LYMPH%: 16 % (ref 14.0–49.7)
MCH: 29.2 pg (ref 25.1–34.0)
MCHC: 32.9 g/dL (ref 31.5–36.0)
MCV: 88.8 fL (ref 79.5–101.0)
MONO#: 0.6 10*3/uL (ref 0.1–0.9)
MONO%: 7.6 % (ref 0.0–14.0)
NEUT%: 73.8 % (ref 38.4–76.8)
NEUTROS ABS: 6.1 10*3/uL (ref 1.5–6.5)
Platelets: 201 10*3/uL (ref 145–400)
RBC: 4.65 10*6/uL (ref 3.70–5.45)
RDW: 13.5 % (ref 11.2–14.5)
WBC: 8.3 10*3/uL (ref 3.9–10.3)

## 2015-08-25 LAB — COMPREHENSIVE METABOLIC PANEL (CC13)
ALT: 16 U/L (ref 0–55)
AST: 15 U/L (ref 5–34)
Albumin: 3.9 g/dL (ref 3.5–5.0)
Alkaline Phosphatase: 135 U/L (ref 40–150)
Anion Gap: 9 mEq/L (ref 3–11)
BUN: 16.5 mg/dL (ref 7.0–26.0)
CALCIUM: 10.1 mg/dL (ref 8.4–10.4)
CO2: 29 meq/L (ref 22–29)
CREATININE: 0.8 mg/dL (ref 0.6–1.1)
Chloride: 107 mEq/L (ref 98–109)
EGFR: 78 mL/min/{1.73_m2} — ABNORMAL LOW (ref >90)
Glucose: 100 mg/dl (ref 70–140)
Potassium: 4.3 mEq/L (ref 3.5–5.1)
Sodium: 145 mEq/L (ref 136–145)
Total Bilirubin: 0.36 mg/dL (ref 0.20–1.20)
Total Protein: 7.2 g/dL (ref 6.4–8.3)

## 2015-08-25 MED ORDER — TRASTUZUMAB CHEMO INJECTION 440 MG
6.0000 mg/kg | Freq: Once | INTRAVENOUS | Status: AC
  Administered 2015-08-25: 525 mg via INTRAVENOUS
  Filled 2015-08-25: qty 25

## 2015-08-25 MED ORDER — HEPARIN SOD (PORK) LOCK FLUSH 100 UNIT/ML IV SOLN
500.0000 [IU] | Freq: Once | INTRAVENOUS | Status: AC | PRN
  Administered 2015-08-25: 500 [IU]
  Filled 2015-08-25: qty 5

## 2015-08-25 MED ORDER — ACETAMINOPHEN 325 MG PO TABS
650.0000 mg | ORAL_TABLET | Freq: Once | ORAL | Status: AC
  Administered 2015-08-25: 650 mg via ORAL

## 2015-08-25 MED ORDER — DIPHENHYDRAMINE HCL 25 MG PO CAPS
25.0000 mg | ORAL_CAPSULE | Freq: Once | ORAL | Status: AC
  Administered 2015-08-25: 25 mg via ORAL

## 2015-08-25 MED ORDER — SODIUM CHLORIDE 0.9 % IV SOLN
Freq: Once | INTRAVENOUS | Status: AC
  Administered 2015-08-25: 11:00:00 via INTRAVENOUS

## 2015-08-25 MED ORDER — ACETAMINOPHEN 325 MG PO TABS
ORAL_TABLET | ORAL | Status: AC
  Filled 2015-08-25: qty 2

## 2015-08-25 MED ORDER — SODIUM CHLORIDE 0.9 % IJ SOLN
10.0000 mL | INTRAMUSCULAR | Status: DC | PRN
  Administered 2015-08-25: 10 mL
  Filled 2015-08-25: qty 10

## 2015-08-25 MED ORDER — DIPHENHYDRAMINE HCL 25 MG PO CAPS
ORAL_CAPSULE | ORAL | Status: AC
  Filled 2015-08-25: qty 1

## 2015-08-25 NOTE — Patient Instructions (Signed)
[facility] Discharge Instructions for Patients Receiving Chemotherapy  Today you received the following chemotherapy agents:  Herceptin  To help prevent nausea and vomiting after your treatment, we encourage you to take your nausea medication as prescribed.   If you develop nausea and vomiting that is not controlled by your nausea medication, call the clinic.   BELOW ARE SYMPTOMS THAT SHOULD BE REPORTED IMMEDIATELY:  *FEVER GREATER THAN 100.5 F  *CHILLS WITH OR WITHOUT FEVER  NAUSEA AND VOMITING THAT IS NOT CONTROLLED WITH YOUR NAUSEA MEDICATION  *UNUSUAL SHORTNESS OF BREATH  *UNUSUAL BRUISING OR BLEEDING  TENDERNESS IN MOUTH AND THROAT WITH OR WITHOUT PRESENCE OF ULCERS  *URINARY PROBLEMS  *BOWEL PROBLEMS  UNUSUAL RASH Items with * indicate a potential emergency and should be followed up as soon as possible.  Feel free to call the clinic you have any questions or concerns. The clinic phone number is [phone].  Please show the CHEMO ALERT CARD at check-in to the Emergency Department and triage nurse.   

## 2015-09-15 ENCOUNTER — Telehealth: Payer: Self-pay | Admitting: Hematology

## 2015-09-15 ENCOUNTER — Other Ambulatory Visit (HOSPITAL_BASED_OUTPATIENT_CLINIC_OR_DEPARTMENT_OTHER): Payer: BLUE CROSS/BLUE SHIELD

## 2015-09-15 ENCOUNTER — Telehealth (HOSPITAL_COMMUNITY): Payer: Self-pay | Admitting: Vascular Surgery

## 2015-09-15 ENCOUNTER — Encounter: Payer: Self-pay | Admitting: Hematology

## 2015-09-15 ENCOUNTER — Telehealth: Payer: Self-pay | Admitting: *Deleted

## 2015-09-15 ENCOUNTER — Ambulatory Visit (HOSPITAL_BASED_OUTPATIENT_CLINIC_OR_DEPARTMENT_OTHER): Payer: BLUE CROSS/BLUE SHIELD

## 2015-09-15 ENCOUNTER — Ambulatory Visit (HOSPITAL_BASED_OUTPATIENT_CLINIC_OR_DEPARTMENT_OTHER): Payer: BLUE CROSS/BLUE SHIELD | Admitting: Hematology

## 2015-09-15 VITALS — BP 161/83 | HR 92 | Temp 98.6°F | Resp 18 | Ht 64.0 in | Wt 197.5 lb

## 2015-09-15 DIAGNOSIS — C773 Secondary and unspecified malignant neoplasm of axilla and upper limb lymph nodes: Secondary | ICD-10-CM

## 2015-09-15 DIAGNOSIS — C50411 Malignant neoplasm of upper-outer quadrant of right female breast: Secondary | ICD-10-CM

## 2015-09-15 DIAGNOSIS — Z5112 Encounter for antineoplastic immunotherapy: Secondary | ICD-10-CM

## 2015-09-15 LAB — CBC WITH DIFFERENTIAL/PLATELET
BASO%: 0.6 % (ref 0.0–2.0)
Basophils Absolute: 0 10*3/uL (ref 0.0–0.1)
EOS%: 2.3 % (ref 0.0–7.0)
Eosinophils Absolute: 0.2 10*3/uL (ref 0.0–0.5)
HCT: 39.7 % (ref 34.8–46.6)
HGB: 13.3 g/dL (ref 11.6–15.9)
LYMPH#: 1.1 10*3/uL (ref 0.9–3.3)
LYMPH%: 16.6 % (ref 14.0–49.7)
MCH: 29 pg (ref 25.1–34.0)
MCHC: 33.4 g/dL (ref 31.5–36.0)
MCV: 87.1 fL (ref 79.5–101.0)
MONO#: 0.4 10*3/uL (ref 0.1–0.9)
MONO%: 5.6 % (ref 0.0–14.0)
NEUT#: 5.1 10*3/uL (ref 1.5–6.5)
NEUT%: 74.9 % (ref 38.4–76.8)
Platelets: 231 10*3/uL (ref 145–400)
RBC: 4.56 10*6/uL (ref 3.70–5.45)
RDW: 14 % (ref 11.2–14.5)
WBC: 6.8 10*3/uL (ref 3.9–10.3)

## 2015-09-15 LAB — COMPREHENSIVE METABOLIC PANEL (CC13)
ALK PHOS: 133 U/L (ref 40–150)
ALT: 14 U/L (ref 0–55)
ANION GAP: 8 meq/L (ref 3–11)
AST: 14 U/L (ref 5–34)
Albumin: 3.7 g/dL (ref 3.5–5.0)
BILIRUBIN TOTAL: 0.37 mg/dL (ref 0.20–1.20)
BUN: 17.3 mg/dL (ref 7.0–26.0)
CALCIUM: 9.7 mg/dL (ref 8.4–10.4)
CO2: 29 mEq/L (ref 22–29)
CREATININE: 0.9 mg/dL (ref 0.6–1.1)
Chloride: 108 mEq/L (ref 98–109)
EGFR: 67 mL/min/{1.73_m2} — AB (ref >90)
Glucose: 91 mg/dl (ref 70–140)
Potassium: 4 mEq/L (ref 3.5–5.1)
Sodium: 145 mEq/L (ref 136–145)
TOTAL PROTEIN: 6.8 g/dL (ref 6.4–8.3)

## 2015-09-15 MED ORDER — HEPARIN SOD (PORK) LOCK FLUSH 100 UNIT/ML IV SOLN
500.0000 [IU] | Freq: Once | INTRAVENOUS | Status: AC | PRN
  Administered 2015-09-15: 500 [IU]
  Filled 2015-09-15: qty 5

## 2015-09-15 MED ORDER — DIPHENHYDRAMINE HCL 25 MG PO CAPS
ORAL_CAPSULE | ORAL | Status: AC
  Filled 2015-09-15: qty 1

## 2015-09-15 MED ORDER — DIPHENHYDRAMINE HCL 25 MG PO CAPS
25.0000 mg | ORAL_CAPSULE | Freq: Once | ORAL | Status: AC
  Administered 2015-09-15: 25 mg via ORAL

## 2015-09-15 MED ORDER — ACETAMINOPHEN 325 MG PO TABS
650.0000 mg | ORAL_TABLET | Freq: Once | ORAL | Status: AC
  Administered 2015-09-15: 650 mg via ORAL

## 2015-09-15 MED ORDER — SODIUM CHLORIDE 0.9 % IV SOLN
Freq: Once | INTRAVENOUS | Status: AC
  Administered 2015-09-15: 10:00:00 via INTRAVENOUS

## 2015-09-15 MED ORDER — SODIUM CHLORIDE 0.9 % IJ SOLN
10.0000 mL | INTRAMUSCULAR | Status: AC | PRN
  Administered 2015-09-15: 10 mL
  Filled 2015-09-15: qty 10

## 2015-09-15 MED ORDER — ACETAMINOPHEN 325 MG PO TABS
ORAL_TABLET | ORAL | Status: AC
  Filled 2015-09-15: qty 2

## 2015-09-15 MED ORDER — TRASTUZUMAB CHEMO INJECTION 440 MG
6.0000 mg/kg | Freq: Once | INTRAVENOUS | Status: AC
  Administered 2015-09-15: 525 mg via INTRAVENOUS
  Filled 2015-09-15: qty 25

## 2015-09-15 NOTE — Telephone Encounter (Signed)
Left message with pt mother to make appt w/ ECHO

## 2015-09-15 NOTE — Progress Notes (Signed)
ECHO ordered but not scheduled, left message at Dr. Damaris Schooner office requesting they contact pt with appointment.

## 2015-09-15 NOTE — Progress Notes (Signed)
[facility]  [phone] [phone]  Clinic Follow up Note   Patient Care Team: [name], MD as PCP - General (Nurse Practitioner) 09/15/2015  SUMMARY OF ONCOLOGIC HISTORY:  Oncology History   Breast cancer of upper-outer quadrant of right female breast   Staging form: Breast, AJCC 7th Edition     Clinical: Stage IV (T2, N1, M1) - Signed by Truitt Merle, MD on 12/29/2014       Prognostic indicators: ER/PR/HER2NEU +      Pathologic: No stage assigned - Unsigned       Prognostic indicators: ER/PR/HER2NEU +        Breast cancer of upper-outer quadrant of right female breast   01/03/2014 Initial Diagnosis Breast cancer of upper-outer quadrant of right female breast, IDA, grade 2, ER 90%+, PR 1%+, her2 (+)   01/20/2014 PET scan Hypermetabolic mass in the posterior right breast (primary), 2 additional nodules in deep right breast (likely nodes) and 3 hyermetabolic portal nodes (lasrgest 2cm, SUV 13).    02/03/2014 - 05/20/2014 Neo-Adjuvant Chemotherapy docetaxel, Taxotere, Carboplatin, Herceptin, Perjeta for 6 cycles    06/01/2014 PET scan Minimal residual hypermetabolic activity within the residual mass inferolaterally in the right breast, likely surgical change. No  other hypermetabolic node or lesion.    06/10/2014 -  Anti-estrogen oral therapy Anastrozole 1 mg once daily   06/10/2014 -  Chemotherapy Maintenance Herceptin every 3 weeks   06/22/2014 Surgery right mastectomy with negative margines and axillary node dissection. [id number], complete patholigical resposne.    05/10/2015 Imaging No convincing evidence of hypermetabolic metastatic disease.Right level 2 node is decreased in size and hypermetabolism. Favored to be reactive.    CURRENT THERAPY:  1.Trastuzumab 6 mg/kg every 3 weeks indefinitely, changed to every 4 weeks from Nov 2016 2. Anastrozole 1 mg once daily  INTERVAL HISTORY: Madeline Davidson returns for follow-up and Herceptin treatment. She has been  doing very well. She works full-time, and starts very early in the morning, she does have some stress from her work, and has mild to moderate fatigue. She otherwise doing very well, no complaints of pain, dyspnea, GI symptoms. She has mild hot flash, tolerable. No significant joint or muscular discomfort.  REVIEW OF SYSTEMS:   Constitutional: Denies fevers, chills or abnormal weight loss Eyes: Denies blurriness of vision Ears, nose, mouth, throat, and face: Denies mucositis or sore throat Respiratory: Denies cough, dyspnea or wheezes Cardiovascular: Denies palpitation, chest discomfort or lower extremity swelling Gastrointestinal:  Denies nausea, heartburn or change in bowel habits Skin: Denies abnormal skin rashes Lymphatics: Denies new lymphadenopathy or easy bruising Neurological:Denies numbness, tingling or new weaknesses Behavioral/Psych: Mood is stable, no new changes  All other systems were reviewed with the patient and are negative.  MEDICAL HISTORY:  Past Medical History  Diagnosis Date  . Allergy   . Wears glasses   . 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 PONV (postoperative nausea and vomiting)   . Anxiety   . History of blood transfusion ~ 04/2014    "related to chemo"  . Anemia ~ 04/2014  . Arthritis     back    SURGICAL HISTORY: Past Surgical History  Procedure Laterality Date  . Colon resection  2004    "for abscess on my colon"  . Hernia repair  2005    umb  . Cholecystectomy  2005  . Portacath placement Left 01/31/2014    Procedure: INSERTION PORT-A-CATH;  Surgeon: Rolm Bookbinder, MD;  Location: [facility];  Service: General;  Laterality: Left;  . Axillary lymph node biopsy Right 12/2013  . Breast biopsy Right 12/2013  . Colon surgery    . Mastectomy w/ sentinel node biopsy Right 06/22/2014    Procedure: RIGHT TOTAL MASTECTOMY WITH SENTINEL LYMPH NODE BIOPSY;  Surgeon: Rolm Bookbinder, MD;  Location: [facility] [facility];   Service: General;  Laterality: Right;    I have reviewed the social history and family history with the patient and they are unchanged from previous note.  ALLERGIES:  is allergic to ciprofloxacin.  MEDICATIONS:  Current Outpatient Prescriptions  Medication Sig Dispense Refill  . acetaminophen (TYLENOL) 500 MG tablet Take 1,000 mg by mouth every 6 (six) hours as needed for mild pain.    Marland Kitchen ALPRAZolam (XANAX) 0.25 MG tablet Take 1 tablet (0.25 mg total) by mouth 3 (three) times daily as needed for anxiety. 30 tablet 0  . anastrozole (ARIMIDEX) 1 MG tablet Take 1 tablet (1 mg total) by mouth daily. 90 tablet 4  . cetirizine (ZYRTEC) 10 MG tablet Take 10 mg by mouth daily as needed for allergies.    . clonazePAM (KLONOPIN) 0.5 MG tablet Take 1 tablet (0.5 mg total) by mouth 2 (two) times daily as needed for anxiety. 30 tablet 0  . ergocalciferol (DRISDOL) 50000 UNITS capsule Take 1 capsule (50,000 Units total) by mouth once a week. 6 capsule 0  . ibuprofen (ADVIL,MOTRIN) 800 MG tablet Take 1 tablet (800 mg total) by mouth every 8 (eight) hours as needed for moderate pain. 90 tablet 2  . lidocaine-prilocaine (EMLA) cream APPLY EXTERNALLY TO AFFECTED AREA(S) AS NEEDED 30 g 1  . traZODone (DESYREL) 50 MG tablet Take 1 tablet (50 mg total) by mouth at bedtime. 20 tablet 1  . zolpidem (AMBIEN CR) 12.5 MG CR tablet Take 1 tablet (12.5 mg total) by mouth at bedtime as needed for sleep. 30 tablet 3   No current facility-administered medications for this visit.    PHYSICAL EXAMINATION: ECOG PERFORMANCE STATUS: 0 - Asymptomatic  Filed Vitals:   09/15/15 0851  BP: 161/83  Pulse: 92  Temp: 98.6 F (37 C)  Resp: 18   Filed Weights   09/15/15 0851  Weight: 197 lb 8 oz (89.585 kg)    GENERAL:alert, no distress and comfortable SKIN: skin color, texture, turgor are normal, no rashes or significant lesions EYES: normal, Conjunctiva are pink and non-injected, sclera clear OROPHARYNX:no exudate,  no erythema and lips, buccal mucosa, and tongue normal  NECK: supple, thyroid normal size, non-tender, without nodularity LYMPH:  no palpable lymphadenopathy in the cervical, axillary or inguinal LUNGS: clear to auscultation and percussion with normal breathing effort HEART: regular rate & rhythm and no murmurs and no lower extremity edema ABDOMEN:abdomen soft, non-tender and normal bowel sounds Musculoskeletal:no cyanosis of digits and no clubbing  NEURO: alert & oriented x 3 with fluent speech, no focal motor/sensory deficits Right breast surgically absent,  no palpable lesions on chest wall or axilla note. Left breast exam no palpable mass, skin change or nipple large.  LABORATORY DATA:  I have reviewed the data as listed CBC Latest Ref Rng 09/15/2015 08/25/2015 08/04/2015  WBC 3.9 - 10.3 10e3/uL 6.8 8.3 7.1  Hemoglobin 11.6 - 15.9 g/dL 13.3 13.6 13.3  Hematocrit 34.8 - 46.6 % 39.7 41.3 39.9  Platelets 145 - 400 10e3/uL 231 201 208     CMP Latest Ref Rng 09/15/2015 08/25/2015 08/04/2015  Glucose 70 - 140 mg/dl 91  100 143(H)  BUN 7.0 - 26.0 mg/dL 17.3 16.5 16.7  Creatinine 0.6 - 1.1 mg/dL 0.9 0.8 0.9  Sodium 136 - 145 mEq/L 145 145 143  Potassium 3.5 - 5.1 mEq/L 4.0 4.3 4.2  Chloride 96 - 112 mEq/L - - -  CO2 22 - 29 mEq/L $Remove'29 29 27  'EiFfoOo$ Calcium 8.4 - 10.4 mg/dL 9.7 10.1 9.5  Total Protein 6.4 - 8.3 g/dL 6.8 7.2 6.8  Total Bilirubin 0.20 - 1.20 mg/dL 0.37 0.36 0.36  Alkaline Phos 40 - 150 U/L 133 135 128  AST 5 - 34 U/L $Remo'14 15 13  'NGnwR$ ALT 0 - 55 U/L $Remo'14 16 15      'fdIFW$ RADIOGRAPHIC STUDIES: I have personally reviewed the radiological images as listed and agreed with the findings in the report.  PET/CT on 05/10/2015 IMPRESSION: 1. No convincing evidence of hypermetabolic metastatic disease. 2. Right level 2 node is decreased in size and hypermetabolism. Favored to be reactive. 3. Right mastectomy with decreased hypermetabolism about the right lateral chest wall. Consider physical exam  correlation. 4. Posterior anal hypermetabolism is similar to slightly decreased and warrants physical exam correlation.  Echo 05/08/2015 Impressions:  - Technically difficult study with poor acoustic windows. Normal LV size and systolic function, EF 37%. Strain and lateral s&' as above. Normal RV size and systolic function. No significant valvular abnormalities.  ASSESSMENT AND PLAN  59 year old postmenopausal woman, was found to have a 3.3 right breast mass and a positive axillary lymph nodes, biopsy confirmed invasive ductal carcinoma, ER positive PR positive HER-2 positive.  Her initial PET scan also reviewed 3 hypermetabolic nodes in periportal, measuring up to 2 cm with SUV 14. The periportal node was not bopsied, but likely represented metastatic nodes. She had completed response to neoadjuvant chemotherapy and underwent right mastectomy.  1. Metastatic right breast cancer to periportal nodes, ER+/PR+/HER2+ -Her case was reviewed in our tumor board this week. Her initial and a post treatment PET scan was reviewed again. Due to the size and metabolic activity of those periportal lymph nodes at diagnosis, she likely has metastatic breast cancer. And the consensus was to continue therapy indefinitely. -Also She had excellent compete response to chemotherapy, She is at VERY high risk for cancer recurrence due to the nature of metastatic disease. We recommend her to continue maintenance Herceptin and anastrozole indefinitely. She agrees with the plan.  -Due to her work schedule, she would like to get Herceptin every 4 weeks, we'll changes from next months. - She is due for echo, she will call her cardiologist to schedule it. -We discussed the risk of osteoporocess when she is on anastrozole, she will continue calcium and vitamin D supplements. Her last bone density scan was several years ago, I recommend to obtain a new one, she agreed. -I encouraged her to continue exercise and remain  to be active. -I'll repeat PET scan in November  2. Hypermetabolic right cervical level II lymph node -This was likely related to her sinusitis, less likely metastatic lesion, giving no cervical or other adenopathy on PET scan. -last PET scan showed decreased metabolic activity. No other new adenopathy.  3. Anxiety and depression, insomnia  -Continue xanax as needed -She tried zoloft and lexapro but could not tolerated.  -She is still on Ambien for insomnia  4. Vit D deficiency and bone health  -Her vitamin D level was 10 on 03/10/2015 -She received Vit D 50,000u weekly x6 weeks then 1000u daily  -She is not taking calcium, I strongly  encouraged her to take calcium -I recommend to repeat a bone density scan, she agreed at this time -I discussed that anastrozole will likely weak her bone, and reviewed the treatment option for osteoporosis. She voiced good understanding.   Plan -Continue Herceptin, will change to every 4 weeks -I'll see her back in 8 weeks -Bone density scan in November -PET scan in November before her next visit  ll questions were answered. The patient knows to call the clinic with any problems, questions or concerns. No barriers to learning was detected.  I spent 20 minutes counseling the patient face to face. The total time spent in the appointment was 25 minutes and more than 50% was on counseling and review of test results   Truitt Merle, MD 09/15/2015

## 2015-09-15 NOTE — Telephone Encounter (Signed)
Per staff message and POF I have scheduled appts. Advised scheduler of appts. JMW  

## 2015-09-15 NOTE — Telephone Encounter (Signed)
per pof to sch pt appt-sch DEXA for pt @ [facility] pt appt time & date

## 2015-09-15 NOTE — Patient Instructions (Addendum)
[facility] Discharge Instructions for Patients Receiving Chemotherapy  Today you received the following chemotherapy agents HERCEPTIN   If you develop nausea and vomiting that is not controlled by your nausea medication, call the clinic.   BELOW ARE SYMPTOMS THAT SHOULD BE REPORTED IMMEDIATELY:  *FEVER GREATER THAN 100.5 F  *CHILLS WITH OR WITHOUT FEVER  NAUSEA AND VOMITING THAT IS NOT CONTROLLED WITH YOUR NAUSEA MEDICATION  *UNUSUAL SHORTNESS OF BREATH  *UNUSUAL BRUISING OR BLEEDING  TENDERNESS IN MOUTH AND THROAT WITH OR WITHOUT PRESENCE OF ULCERS  *URINARY PROBLEMS  *BOWEL PROBLEMS  UNUSUAL RASH Items with * indicate a potential emergency and should be followed up as soon as possible.  Feel free to call the clinic should you have any questions or concerns. The clinic phone number is [phone].  Please show the CHEMO ALERT CARD at check-in to the Emergency Department and triage nurse.   

## 2015-10-06 ENCOUNTER — Ambulatory Visit: Payer: BLUE CROSS/BLUE SHIELD

## 2015-10-06 ENCOUNTER — Ambulatory Visit (HOSPITAL_COMMUNITY)
Admission: RE | Admit: 2015-10-06 | Discharge: 2015-10-06 | Disposition: A | Discharge status: Home / Self Care | Payer: BLUE CROSS/BLUE SHIELD | Source: Ambulatory Visit | Attending: Hematology | Admitting: Hematology

## 2015-10-06 ENCOUNTER — Ambulatory Visit (HOSPITAL_BASED_OUTPATIENT_CLINIC_OR_DEPARTMENT_OTHER)
Admission: RE | Admit: 2015-10-06 | Discharge: 2015-10-06 | Disposition: A | Discharge status: Home / Self Care | Payer: BLUE CROSS/BLUE SHIELD | Source: Ambulatory Visit | Attending: Cardiology | Admitting: Cardiology

## 2015-10-06 VITALS — BP 154/86 | HR 80 | Wt 195.0 lb

## 2015-10-06 DIAGNOSIS — C50411 Malignant neoplasm of upper-outer quadrant of right female breast: Secondary | ICD-10-CM | POA: Diagnosis not present

## 2015-10-06 DIAGNOSIS — I340 Nonrheumatic mitral (valve) insufficiency: Secondary | ICD-10-CM | POA: Insufficient documentation

## 2015-10-06 DIAGNOSIS — I10 Essential (primary) hypertension: Secondary | ICD-10-CM | POA: Insufficient documentation

## 2015-10-06 MED ORDER — LISINOPRIL 10 MG PO TABS: 10.0000 mg | ORAL_TABLET | Freq: Every day | ORAL | Status: DC

## 2015-10-06 NOTE — Patient Instructions (Signed)
RESTART Lisinptil 10 mg, one tab daily at bedtime  Your physician recommends that you schedule a follow-up appointment in:  4 months with echo  Your physician has requested that you have an echocardiogram. Echocardiography is a painless test that uses sound waves to create images of your heart. It provides your doctor with information about the size and shape of your heart and how well your heart's chambers and valves are working. This procedure takes approximately one hour. There are no restrictions for this procedure.

## 2015-10-06 NOTE — Progress Notes (Signed)
  Echocardiogram 2D Echocardiogram has been performed.  Madeline Davidson 10/06/2015, 10:49 AM

## 2015-10-07 NOTE — Progress Notes (Signed)
Patient ID: Madeline Davidson, female   DOB: 01-05-1956, 59 y.o.   MRN: [id number] Oncologist: Dr Burr Medico PCP: Dr Norma Fredrickson  HPI:  Madeline Davidson is a 59 year old female with HTN, obesity and right breast cancer in the lower outer quadrant stage II (T2, N1). She was referred to the cardio-oncology clinic by Dr Humphrey Rolls   She started chemo 02/03/14. Has finished chemo.  S/p mastectomy in July 2015. Getting Herceptin indefinitely for metastatic disease, interval has been extended out to 4 weeks.   BP is high today.  She stopped lisinopril because it made her tired.  No dyspnea or chest pain.       ECHO 01/24/14 EF 60-65% Lateral S' 9.8 ECHO 6/15 EF 60-65%, lateral S' 12.8, strain not done Echo 10/14/14  EF 60-65% lateral s' 11.1 GLS -23.8% Echo 2/16 EF 55-60%, poor strain tracking Echo 5/16 EF 60%, lateral s' 13.9 cm/sec, GLS -19% (poor images for strain) Echo 10/16 EF 60%, lateral s' 10/8, GLS -20.8%, normal RV size and systolic function  FH: Uncle has HF. Mom has HTN   SH: Lives with her Mom. Works full time [place]: All systems reviewed and negative except as per HPI.   Past Medical History  Diagnosis Date  . Allergy   . Wears glasses   . 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 PONV (postoperative nausea and vomiting)   . Anxiety   . History of blood transfusion ~ 04/2014    "related to chemo"  . Anemia ~ 04/2014  . Arthritis     back    Current Outpatient Prescriptions  Medication Sig Dispense Refill  . acetaminophen (TYLENOL) 500 MG tablet Take 1,000 mg by mouth every 6 (six) hours as needed for mild pain.    Marland Kitchen ALPRAZolam (XANAX) 0.25 MG tablet Take 1 tablet (0.25 mg total) by mouth 3 (three) times daily as needed for anxiety. 30 tablet 0  . anastrozole (ARIMIDEX) 1 MG tablet Take 1 tablet (1 mg total) by mouth daily. 90 tablet 4  . cetirizine (ZYRTEC) 10 MG tablet Take 10 mg by mouth daily as needed for allergies.    .  clonazePAM (KLONOPIN) 0.5 MG tablet Take 1 tablet (0.5 mg total) by mouth 2 (two) times daily as needed for anxiety. 30 tablet 0  . ergocalciferol (DRISDOL) 50000 UNITS capsule Take 1 capsule (50,000 Units total) by mouth once a week. 6 capsule 0  . ibuprofen (ADVIL,MOTRIN) 800 MG tablet Take 1 tablet (800 mg total) by mouth every 8 (eight) hours as needed for moderate pain. 90 tablet 2  . lidocaine-prilocaine (EMLA) cream APPLY EXTERNALLY TO AFFECTED AREA(S) AS NEEDED 30 g 1  . zolpidem (AMBIEN CR) 12.5 MG CR tablet Take 1 tablet (12.5 mg total) by mouth at bedtime as needed for sleep. 30 tablet 3  . lisinopril (ZESTRIL) 10 MG tablet Take 1 tablet (10 mg total) by mouth at bedtime. 30 tablet 6   No current facility-administered medications for this encounter.   Facility-Administered Medications Ordered in Other Encounters  Medication Dose Route Frequency Provider Last Rate Last Dose  . sodium chloride 0.9 % injection 10 mL  10 mL Intracatheter PRN Truitt Merle, MD   10 mL at 09/15/15 1129     Allergies  Allergen Reactions  . Ciprofloxacin Other (See Comments)    Patient had a headache after taking Cipro. Made her sinus infection symptoms worse.    Social  History   Social History  . Marital Status: Divorced    Spouse Name: N/A  . Number of Children: N/A  . Years of Education: N/A   Occupational History  . Not on file.   Social History Main Topics  . Smoking status: Never Smoker   . Smokeless tobacco: Never Used  . Alcohol Use: No  . Drug Use: No  . Sexual Activity: No   Other Topics Concern  . Not on file   Social History Narrative    Family History  Problem Relation Age of Onset  . Cancer Father 5    colon cancer  . Hypertension Mother     PHYSICAL EXAM: Filed Vitals:   10/06/15 1113  BP: 154/86  Pulse: 80   General:  Well appearing. No respiratory difficulty HEENT: normal Neck: supple. no JVD. Carotids 2+ bilat; no bruits. No lymphadenopathy or thryomegaly  appreciated. Cor: PMI nondisplaced. Regular rate & rhythm. No rubs, gallops or murmurs. Lungs: clear Abdomen: soft, nontender, nondistended. No hepatosplenomegaly. No bruits or masses. Good bowel sounds. Extremities: no cyanosis, clubbing, rash, edema Neuro: alert & oriented x 3, cranial nerves grossly intact. moves all 4 extremities w/o difficulty. Affect pleasant.  ASSESSMENT & PLAN: 1. Breast Cancer: Metastatic.  She will be getting Herceptin q4 wks indefinitely.  Echo today showed stable EF and strain.  Continue Herceptin, repeat echo in 4 months with followup. 2. HTN: BP running high.  She will try lisinopril 10 mg daily again, this time taking it in the evening. BMET in 2 wks.   Madeline Winward,MD 10/07/2015

## 2015-10-12 ENCOUNTER — Other Ambulatory Visit: Payer: Self-pay | Admitting: *Deleted

## 2015-10-12 DIAGNOSIS — C50411 Malignant neoplasm of upper-outer quadrant of right female breast: Secondary | ICD-10-CM

## 2015-10-13 ENCOUNTER — Ambulatory Visit (HOSPITAL_BASED_OUTPATIENT_CLINIC_OR_DEPARTMENT_OTHER): Payer: BLUE CROSS/BLUE SHIELD

## 2015-10-13 ENCOUNTER — Other Ambulatory Visit (HOSPITAL_BASED_OUTPATIENT_CLINIC_OR_DEPARTMENT_OTHER): Payer: BLUE CROSS/BLUE SHIELD

## 2015-10-13 VITALS — BP 120/69 | HR 83 | Temp 98.4°F | Resp 17

## 2015-10-13 DIAGNOSIS — C50411 Malignant neoplasm of upper-outer quadrant of right female breast: Secondary | ICD-10-CM

## 2015-10-13 DIAGNOSIS — Z5112 Encounter for antineoplastic immunotherapy: Secondary | ICD-10-CM | POA: Diagnosis not present

## 2015-10-13 LAB — CBC WITH DIFFERENTIAL/PLATELET
BASO%: 0.7 % (ref 0.0–2.0)
BASOS ABS: 0 10*3/uL (ref 0.0–0.1)
EOS%: 2.5 % (ref 0.0–7.0)
Eosinophils Absolute: 0.2 10*3/uL (ref 0.0–0.5)
HCT: 37.9 % (ref 34.8–46.6)
HGB: 12.6 g/dL (ref 11.6–15.9)
LYMPH%: 14.9 % (ref 14.0–49.7)
MCH: 28.9 pg (ref 25.1–34.0)
MCHC: 33.2 g/dL (ref 31.5–36.0)
MCV: 87.1 fL (ref 79.5–101.0)
MONO#: 0.4 10*3/uL (ref 0.1–0.9)
MONO%: 5.5 % (ref 0.0–14.0)
NEUT#: 5.2 10*3/uL (ref 1.5–6.5)
NEUT%: 76.4 % (ref 38.4–76.8)
Platelets: 234 10*3/uL (ref 145–400)
RBC: 4.35 10*6/uL (ref 3.70–5.45)
RDW: 14.3 % (ref 11.2–14.5)
WBC: 6.8 10*3/uL (ref 3.9–10.3)
lymph#: 1 10*3/uL (ref 0.9–3.3)

## 2015-10-13 LAB — COMPREHENSIVE METABOLIC PANEL (CC13)
ALT: 14 U/L (ref 0–55)
AST: 13 U/L (ref 5–34)
Albumin: 3.6 g/dL (ref 3.5–5.0)
Alkaline Phosphatase: 119 U/L (ref 40–150)
Anion Gap: 9 mEq/L (ref 3–11)
BUN: 14.9 mg/dL (ref 7.0–26.0)
CHLORIDE: 110 meq/L — AB (ref 98–109)
CO2: 26 meq/L (ref 22–29)
Calcium: 9.7 mg/dL (ref 8.4–10.4)
Creatinine: 0.8 mg/dL (ref 0.6–1.1)
EGFR: 78 mL/min/{1.73_m2} — ABNORMAL LOW (ref >90)
GLUCOSE: 93 mg/dL (ref 70–140)
POTASSIUM: 4 meq/L (ref 3.5–5.1)
SODIUM: 145 meq/L (ref 136–145)
Total Bilirubin: 0.31 mg/dL (ref 0.20–1.20)
Total Protein: 6.7 g/dL (ref 6.4–8.3)

## 2015-10-13 MED ORDER — SODIUM CHLORIDE 0.9 % IJ SOLN
10.0000 mL | INTRAMUSCULAR | Status: DC | PRN
  Filled 2015-10-13: qty 10

## 2015-10-13 MED ORDER — SODIUM CHLORIDE 0.9 % IV SOLN
Freq: Once | INTRAVENOUS | Status: AC
  Administered 2015-10-13: 09:00:00 via INTRAVENOUS

## 2015-10-13 MED ORDER — DIPHENHYDRAMINE HCL 25 MG PO CAPS
ORAL_CAPSULE | ORAL | Status: AC
  Filled 2015-10-13: qty 1

## 2015-10-13 MED ORDER — ACETAMINOPHEN 325 MG PO TABS
ORAL_TABLET | ORAL | Status: AC
  Filled 2015-10-13: qty 2

## 2015-10-13 MED ORDER — ACETAMINOPHEN 325 MG PO TABS
650.0000 mg | ORAL_TABLET | Freq: Once | ORAL | Status: AC
  Administered 2015-10-13: 650 mg via ORAL

## 2015-10-13 MED ORDER — TRASTUZUMAB CHEMO INJECTION 440 MG
6.0000 mg/kg | Freq: Once | INTRAVENOUS | Status: AC
  Administered 2015-10-13: 525 mg via INTRAVENOUS
  Filled 2015-10-13: qty 25

## 2015-10-13 MED ORDER — DIPHENHYDRAMINE HCL 25 MG PO CAPS
25.0000 mg | ORAL_CAPSULE | Freq: Once | ORAL | Status: AC
  Administered 2015-10-13: 25 mg via ORAL

## 2015-10-13 MED ORDER — HEPARIN SOD (PORK) LOCK FLUSH 100 UNIT/ML IV SOLN
500.0000 [IU] | Freq: Once | INTRAVENOUS | Status: AC | PRN
  Administered 2015-10-13: 500 [IU]
  Filled 2015-10-13: qty 5

## 2015-10-13 NOTE — Patient Instructions (Signed)
[facility] Discharge Instructions for Patients Receiving Chemotherapy  Today you received the following chemotherapy agents: Herceptin   To help prevent nausea and vomiting after your treatment, we encourage you to take your nausea medication as directed.    If you develop nausea and vomiting that is not controlled by your nausea medication, call the clinic.   BELOW ARE SYMPTOMS THAT SHOULD BE REPORTED IMMEDIATELY:  *FEVER GREATER THAN 100.5 F  *CHILLS WITH OR WITHOUT FEVER  NAUSEA AND VOMITING THAT IS NOT CONTROLLED WITH YOUR NAUSEA MEDICATION  *UNUSUAL SHORTNESS OF BREATH  *UNUSUAL BRUISING OR BLEEDING  TENDERNESS IN MOUTH AND THROAT WITH OR WITHOUT PRESENCE OF ULCERS  *URINARY PROBLEMS  *BOWEL PROBLEMS  UNUSUAL RASH Items with * indicate a potential emergency and should be followed up as soon as possible.  Feel free to call the clinic you have any questions or concerns. The clinic phone number is [phone].  Please show the CHEMO ALERT CARD at check-in to the Emergency Department and triage nurse.   

## 2015-11-07 ENCOUNTER — Encounter (HOSPITAL_COMMUNITY)
Admission: RE | Admit: 2015-11-07 | Discharge: 2015-11-07 | Disposition: A | Discharge status: Home / Self Care | Payer: BLUE CROSS/BLUE SHIELD | Source: Ambulatory Visit | Attending: Hematology | Admitting: Hematology

## 2015-11-07 DIAGNOSIS — C50411 Malignant neoplasm of upper-outer quadrant of right female breast: Secondary | ICD-10-CM | POA: Diagnosis not present

## 2015-11-07 LAB — GLUCOSE, CAPILLARY: GLUCOSE-CAPILLARY: 98 mg/dL (ref 65–99)

## 2015-11-07 MED ORDER — FLUDEOXYGLUCOSE F - 18 (FDG) INJECTION
10.9900 | Freq: Once | INTRAVENOUS | Status: DC | PRN
  Administered 2015-11-07: 10.99 via INTRAVENOUS
  Filled 2015-11-07: qty 10.99

## 2015-11-09 ENCOUNTER — Other Ambulatory Visit: Payer: Self-pay | Admitting: Hematology

## 2015-11-10 ENCOUNTER — Other Ambulatory Visit (HOSPITAL_BASED_OUTPATIENT_CLINIC_OR_DEPARTMENT_OTHER): Payer: BLUE CROSS/BLUE SHIELD

## 2015-11-10 ENCOUNTER — Ambulatory Visit (HOSPITAL_BASED_OUTPATIENT_CLINIC_OR_DEPARTMENT_OTHER): Payer: BLUE CROSS/BLUE SHIELD

## 2015-11-10 ENCOUNTER — Telehealth: Payer: Self-pay | Admitting: Hematology

## 2015-11-10 ENCOUNTER — Encounter: Payer: Self-pay | Admitting: Hematology

## 2015-11-10 ENCOUNTER — Ambulatory Visit (HOSPITAL_BASED_OUTPATIENT_CLINIC_OR_DEPARTMENT_OTHER): Payer: BLUE CROSS/BLUE SHIELD | Admitting: Hematology

## 2015-11-10 VITALS — BP 162/88 | HR 103 | Temp 98.5°F | Resp 18 | Ht 64.0 in | Wt 195.9 lb

## 2015-11-10 DIAGNOSIS — Z5112 Encounter for antineoplastic immunotherapy: Secondary | ICD-10-CM

## 2015-11-10 DIAGNOSIS — C50411 Malignant neoplasm of upper-outer quadrant of right female breast: Secondary | ICD-10-CM

## 2015-11-10 DIAGNOSIS — C50919 Malignant neoplasm of unspecified site of unspecified female breast: Secondary | ICD-10-CM

## 2015-11-10 LAB — CBC WITH DIFFERENTIAL/PLATELET
BASO%: 0.6 % (ref 0.0–2.0)
Basophils Absolute: 0 10*3/uL (ref 0.0–0.1)
EOS%: 2.4 % (ref 0.0–7.0)
Eosinophils Absolute: 0.2 10*3/uL (ref 0.0–0.5)
HEMATOCRIT: 37.7 % (ref 34.8–46.6)
HGB: 12.6 g/dL (ref 11.6–15.9)
LYMPH#: 1.3 10*3/uL (ref 0.9–3.3)
LYMPH%: 16.3 % (ref 14.0–49.7)
MCH: 29.1 pg (ref 25.1–34.0)
MCHC: 33.4 g/dL (ref 31.5–36.0)
MCV: 87.1 fL (ref 79.5–101.0)
MONO#: 0.5 10*3/uL (ref 0.1–0.9)
MONO%: 6.2 % (ref 0.0–14.0)
NEUT#: 5.8 10*3/uL (ref 1.5–6.5)
NEUT%: 74.5 % (ref 38.4–76.8)
Platelets: 240 10*3/uL (ref 145–400)
RBC: 4.33 10*6/uL (ref 3.70–5.45)
RDW: 14.1 % (ref 11.2–14.5)
WBC: 7.8 10*3/uL (ref 3.9–10.3)

## 2015-11-10 LAB — COMPREHENSIVE METABOLIC PANEL (CC13)
ALK PHOS: 130 U/L (ref 40–150)
ALT: 15 U/L (ref 0–55)
AST: 15 U/L (ref 5–34)
Albumin: 3.7 g/dL (ref 3.5–5.0)
Anion Gap: 6 mEq/L (ref 3–11)
BUN: 17 mg/dL (ref 7.0–26.0)
CALCIUM: 9.7 mg/dL (ref 8.4–10.4)
CHLORIDE: 108 meq/L (ref 98–109)
CO2: 28 meq/L (ref 22–29)
Creatinine: 0.8 mg/dL (ref 0.6–1.1)
EGFR: 78 mL/min/{1.73_m2} — ABNORMAL LOW (ref >90)
GLUCOSE: 104 mg/dL (ref 70–140)
POTASSIUM: 4.2 meq/L (ref 3.5–5.1)
SODIUM: 142 meq/L (ref 136–145)
Total Bilirubin: 0.3 mg/dL (ref 0.20–1.20)
Total Protein: 6.8 g/dL (ref 6.4–8.3)

## 2015-11-10 MED ORDER — DIPHENHYDRAMINE HCL 25 MG PO CAPS
ORAL_CAPSULE | ORAL | Status: AC
  Filled 2015-11-10: qty 1

## 2015-11-10 MED ORDER — HEPARIN SOD (PORK) LOCK FLUSH 100 UNIT/ML IV SOLN
500.0000 [IU] | Freq: Once | INTRAVENOUS | Status: AC | PRN
  Administered 2015-11-10: 500 [IU]
  Filled 2015-11-10: qty 5

## 2015-11-10 MED ORDER — TRASTUZUMAB CHEMO INJECTION 440 MG
6.0000 mg/kg | Freq: Once | INTRAVENOUS | Status: AC
  Administered 2015-11-10: 525 mg via INTRAVENOUS
  Filled 2015-11-10: qty 25

## 2015-11-10 MED ORDER — SODIUM CHLORIDE 0.9 % IJ SOLN
10.0000 mL | INTRAMUSCULAR | Status: DC | PRN
  Administered 2015-11-10: 10 mL
  Filled 2015-11-10: qty 10

## 2015-11-10 MED ORDER — DIPHENHYDRAMINE HCL 25 MG PO CAPS
25.0000 mg | ORAL_CAPSULE | Freq: Once | ORAL | Status: AC
Start: 2015-11-10 — End: 2015-11-10
  Administered 2015-11-10: 25 mg via ORAL

## 2015-11-10 MED ORDER — SODIUM CHLORIDE 0.9 % IV SOLN
Freq: Once | INTRAVENOUS | Status: AC
  Administered 2015-11-10: 15:00:00 via INTRAVENOUS

## 2015-11-10 MED ORDER — ACETAMINOPHEN 325 MG PO TABS
ORAL_TABLET | ORAL | Status: AC
  Filled 2015-11-10: qty 2

## 2015-11-10 MED ORDER — ZOLPIDEM TARTRATE ER 12.5 MG PO TBCR: 12.5000 mg | EXTENDED_RELEASE_TABLET | Freq: Every evening | ORAL | Status: DC | PRN

## 2015-11-10 MED ORDER — ACETAMINOPHEN 325 MG PO TABS
650.0000 mg | ORAL_TABLET | Freq: Once | ORAL | Status: AC
  Administered 2015-11-10: 650 mg via ORAL

## 2015-11-10 NOTE — Telephone Encounter (Signed)
Gave patient avs report and appointments for December and January  °

## 2015-11-10 NOTE — Patient Instructions (Signed)
[facility] Discharge Instructions for Patients Receiving Chemotherapy  Today you received the following chemotherapy agents: Herceptin   To help prevent nausea and vomiting after your treatment, we encourage you to take your nausea medication as directed.    If you develop nausea and vomiting that is not controlled by your nausea medication, call the clinic.   BELOW ARE SYMPTOMS THAT SHOULD BE REPORTED IMMEDIATELY:  *FEVER GREATER THAN 100.5 F  *CHILLS WITH OR WITHOUT FEVER  NAUSEA AND VOMITING THAT IS NOT CONTROLLED WITH YOUR NAUSEA MEDICATION  *UNUSUAL SHORTNESS OF BREATH  *UNUSUAL BRUISING OR BLEEDING  TENDERNESS IN MOUTH AND THROAT WITH OR WITHOUT PRESENCE OF ULCERS  *URINARY PROBLEMS  *BOWEL PROBLEMS  UNUSUAL RASH Items with * indicate a potential emergency and should be followed up as soon as possible.  Feel free to call the clinic you have any questions or concerns. The clinic phone number is [phone].  Please show the CHEMO ALERT CARD at check-in to the Emergency Department and triage nurse.   

## 2015-11-10 NOTE — Progress Notes (Signed)
[place]  [phone] [phone]  Clinic Follow up Note   Patient Care Team: [name], MD as PCP - General (Nurse Practitioner) 11/10/2015  SUMMARY OF ONCOLOGIC HISTORY:  Oncology History   Breast cancer of upper-outer quadrant of right female breast   Staging form: Breast, AJCC 7th Edition     Clinical: Stage IV (T2, N1, M1) - Signed by Truitt Merle, MD on 12/29/2014       Prognostic indicators: ER/PR/HER2NEU +      Pathologic: No stage assigned - Unsigned       Prognostic indicators: ER/PR/HER2NEU +        Breast cancer of upper-outer quadrant of right female breast ([place])   01/03/2014 Initial Diagnosis Breast cancer of upper-outer quadrant of right female breast, IDA, grade 2, ER 90%+, PR 1%+, her2 (+)   01/20/2014 PET scan Hypermetabolic mass in the posterior right breast (primary), 2 additional nodules in deep right breast (likely nodes) and 3 hyermetabolic portal nodes (lasrgest 2cm, SUV 13).    02/03/2014 - 05/20/2014 Neo-Adjuvant Chemotherapy docetaxel, Taxotere, Carboplatin, Herceptin, Perjeta for 6 cycles    06/01/2014 PET scan Minimal residual hypermetabolic activity within the residual mass inferolaterally in the right breast, likely surgical change. No  other hypermetabolic node or lesion.    06/10/2014 -  Anti-estrogen oral therapy Anastrozole 1 mg once daily   06/10/2014 -  Chemotherapy Maintenance Herceptin every 3 weeks   06/22/2014 Surgery right mastectomy with negative margines and axillary node dissection. [id number], complete patholigical resposne.    05/10/2015 Imaging No convincing evidence of hypermetabolic metastatic disease.Right level 2 node is decreased in size and hypermetabolism. Favored to be reactive.    CURRENT THERAPY:  1.Trastuzumab 6 mg/kg every 3 weeks indefinitely, changed to every 4 weeks from Nov 2016 2. Anastrozole 1 mg once daily  INTERVAL HISTORY: Kassady returns for follow-up and Herceptin treatment. She has  been doing very well. She has mild fatigue from, but otherwise tolerating well. She works full-time, denies any significant pain, dyspnea, or change of her bowel habits. She denies any constipation or diarrhea, no melena or hematochezia, no abdominal discomfort. She has good appetite and eating well.  REVIEW OF SYSTEMS:   Constitutional: Denies fevers, chills or abnormal weight loss Eyes: Denies blurriness of vision Ears, nose, mouth, throat, and face: Denies mucositis or sore throat Respiratory: Denies cough, dyspnea or wheezes Cardiovascular: Denies palpitation, chest discomfort or lower extremity swelling Gastrointestinal:  Denies nausea, heartburn or change in bowel habits Skin: Denies abnormal skin rashes Lymphatics: Denies new lymphadenopathy or easy bruising Neurological:Denies numbness, tingling or new weaknesses Behavioral/Psych: Mood is stable, no new changes  All other systems were reviewed with the patient and are negative.  MEDICAL HISTORY:  Past Medical History  Diagnosis Date  . Allergy   . Wears glasses   . Breast cancer ([place]) 12/16/13     Invasive ductal Carcinoma; 1/1 nodes positive / self palpated  . PONV (postoperative nausea and vomiting)   . Anxiety   . History of blood transfusion ~ 04/2014    "related to chemo"  . Anemia ~ 04/2014  . Arthritis     back    SURGICAL HISTORY: Past Surgical History  Procedure Laterality Date  . Colon resection  2004    "for abscess on my colon"  . Hernia repair  2005    umb  . Cholecystectomy  2005  . Portacath placement Left 01/31/2014    Procedure: INSERTION PORT-A-CATH;  Surgeon: Rodman Key  Dwain Sarna, MD;  Location: [facility] OR;  Service: General;  Laterality: Left;  . Axillary lymph node biopsy Right 12/2013  . Breast biopsy Right 12/2013  . Colon surgery    . Mastectomy w/ sentinel node biopsy Right 06/22/2014    Procedure: RIGHT TOTAL MASTECTOMY WITH SENTINEL LYMPH NODE BIOPSY;  Surgeon: Emelia Loron, MD;  Location: [facility] OR;   Service: General;  Laterality: Right;    I have reviewed the social history and family history with the patient and they are unchanged from previous note.  ALLERGIES:  is allergic to ciprofloxacin.  MEDICATIONS:  Current Outpatient Prescriptions  Medication Sig Dispense Refill  . acetaminophen (TYLENOL) 500 MG tablet Take 1,000 mg by mouth every 6 (six) hours as needed for mild pain.    Marland Kitchen ALPRAZolam (XANAX) 0.25 MG tablet Take 1 tablet (0.25 mg total) by mouth 3 (three) times daily as needed for anxiety. 30 tablet 0  . anastrozole (ARIMIDEX) 1 MG tablet Take 1 tablet (1 mg total) by mouth daily. 90 tablet 4  . cetirizine (ZYRTEC) 10 MG tablet Take 10 mg by mouth daily as needed for allergies.    . clonazePAM (KLONOPIN) 0.5 MG tablet Take 1 tablet (0.5 mg total) by mouth 2 (two) times daily as needed for anxiety. 30 tablet 0  . ergocalciferol (DRISDOL) 50000 UNITS capsule Take 1 capsule (50,000 Units total) by mouth once a week. 6 capsule 0  . ibuprofen (ADVIL,MOTRIN) 800 MG tablet Take 1 tablet (800 mg total) by mouth every 8 (eight) hours as needed for moderate pain. 90 tablet 2  . lidocaine-prilocaine (EMLA) cream APPLY EXTERNALLY TO AFFECTED AREA(S) AS NEEDED 30 g 1  . lisinopril (ZESTRIL) 10 MG tablet Take 1 tablet (10 mg total) by mouth at bedtime. 30 tablet 6  . zolpidem (AMBIEN CR) 12.5 MG CR tablet Take 1 tablet (12.5 mg total) by mouth at bedtime as needed for sleep. 30 tablet 3   No current facility-administered medications for this visit.   Facility-Administered Medications Ordered in Other Visits  Medication Dose Route Frequency Provider Last Rate Last Dose  . fludeoxyglucose F - 18 (FDG) injection 10.99 milli Curie  10.99 milli Curie Intravenous Once PRN Malachy Mood, MD   10.99 milli Curie at 11/07/15 [phone]  . sodium chloride 0.9 % injection 10 mL  10 mL Intracatheter PRN Malachy Mood, MD   10 mL at 09/15/15 1129    PHYSICAL EXAMINATION: ECOG PERFORMANCE STATUS: 0 -  Asymptomatic  Filed Vitals:   11/10/15 1417  BP: 162/88  Pulse: 103  Temp: 98.5 F (36.9 C)  Resp: 18   Filed Weights   11/10/15 1417  Weight: 195 lb 14.4 oz (88.86 kg)    GENERAL:alert, no distress and comfortable SKIN: skin color, texture, turgor are normal, no rashes or significant lesions EYES: normal, Conjunctiva are pink and non-injected, sclera clear OROPHARYNX:no exudate, no erythema and lips, buccal mucosa, and tongue normal  NECK: supple, thyroid normal size, non-tender, without nodularity LYMPH:  no palpable lymphadenopathy in the cervical, axillary or inguinal LUNGS: clear to auscultation and percussion with normal breathing effort HEART: regular rate & rhythm and no murmurs and no lower extremity edema ABDOMEN:abdomen soft, non-tender and normal bowel sounds, anus/rectal exam and inspection did not show any mass in the anus or distal rectum, no blood on the tip of glove  Musculoskeletal:no cyanosis of digits and no clubbing  NEURO: alert & oriented x 3 with fluent speech, no focal motor/sensory deficits Right breast surgically absent,  no palpable lesions on chest wall or axilla note. Left breast exam no palpable mass, skin change or nipple large.  LABORATORY DATA:  I have reviewed the data as listed CBC Latest Ref Rng 10/13/2015 09/15/2015 08/25/2015  WBC 3.9 - 10.3 10e3/uL 6.8 6.8 8.3  Hemoglobin 11.6 - 15.9 g/dL 12.6 13.3 13.6  Hematocrit 34.8 - 46.6 % 37.9 39.7 41.3  Platelets 145 - 400 10e3/uL 234 231 201     CMP Latest Ref Rng 10/13/2015 09/15/2015 08/25/2015  Glucose 70 - 140 mg/dl 93 91 100  BUN 7.0 - 26.0 mg/dL 14.9 17.3 16.5  Creatinine 0.6 - 1.1 mg/dL 0.8 0.9 0.8  Sodium 136 - 145 mEq/L 145 145 145  Potassium 3.5 - 5.1 mEq/L 4.0 4.0 4.3  Chloride 96 - 112 mEq/L - - -  CO2 22 - 29 mEq/L $Remove'26 29 29  'IihWqJh$ Calcium 8.4 - 10.4 mg/dL 9.7 9.7 10.1  Total Protein 6.4 - 8.3 g/dL 6.7 6.8 7.2  Total Bilirubin 0.20 - 1.20 mg/dL 0.31 0.37 0.36  Alkaline Phos 40 - 150  U/L 119 133 135  AST 5 - 34 U/L $Remo'13 14 15  'hAnWu$ ALT 0 - 55 U/L $Remo'14 14 16      'PCUOW$ RADIOGRAPHIC STUDIES: I have personally reviewed the radiological images as listed and agreed with the findings in the report.  PET/CT on 11/07/2015 IMPRESSION: 1. No evidence of hypermetabolic recurrent or metastatic disease. Areas of hypermetabolism within the right level 2 station of the neck and right chest wall have resolved. 2. Progressive posterior anal hypermetabolism again warrants physical exam correlation. 3. Other chronic findings, including multiple abdominal and pelvic wall hernias, as detailed above.  Echo 10/06/2015 IImpressions:  - Normal LV size with EF 60%. Strain and lateral s&' as above. Normal RV size and systolic function. No significant valvular abnormalities..  ASSESSMENT AND PLAN  59 year old postmenopausal woman, was found to have a 3.3 right breast mass and a positive axillary lymph nodes, biopsy confirmed invasive ductal carcinoma, ER positive PR positive HER-2 positive.  Her initial PET scan also reviewed 3 hypermetabolic nodes in periportal, measuring up to 2 cm with SUV 14. The periportal node was not bopsied, but likely represented metastatic nodes. She had completed response to neoadjuvant chemotherapy and underwent right mastectomy.  1. Metastatic right breast cancer to periportal nodes, ER+/PR+/HER2+ -Her case was reviewed in our tumor board previously. Her initial and a post treatment PET scan was reviewed again. Due to the size and metabolic activity of those periportal lymph nodes at diagnosis, she likely has metastatic breast cancer. And the consensus was to continue therapy indefinitely. -Also She had excellent compete response to chemotherapy, She is at VERY high risk for cancer recurrence due to the nature of metastatic disease. We recommend her to continue maintenance Herceptin and anastrozole indefinitely. She agrees with the plan.  -I reviewed her PET scan  images and result with her in detail. No evidence of disease. Her previous hypermetabolic level II cervical adenopathy has resolved. -Due to her work schedule, we have changed her Herceptin to every 4 weeks. - Repeat echocardiogram from last month's was normal -We discussed the risk of osteoporocess when she is on anastrozole, she will continue calcium and vitamin D supplements. Her last bone density scan was several years ago, I recommend to obtain a new one, she agreed. -I encouraged her to continue exercise and remain to be active. -We will follow-up PET scan every 6 months if she is clinically doing well  2.  Hypermetabolic uptake at anus -She has persistent hypermetabolic uptake at the anus from the past 2 PET scans -She is clinically asymptomatic, no pain, hematochezia or constipation. -Today's rectal exam was negative for mass or bleeding. -She has not had colonoscopy. I strongly encouraged her to consider a colonoscopy  3. Anxiety and depression, insomnia  -Continue xanax as needed -She tried zoloft and lexapro but could not tolerated.  -She is still on Ambien for insomnia  4. Vit D deficiency and bone health  -Her vitamin D level was 10 on 03/10/2015 -She received Vit D 50,000u weekly x6 weeks then 1000u daily  -I strongly encouraged her to continue taking calcium -Her DEXA scan showed normal bone density in 09/2015  -I discussed that anastrozole will likely weak her bone, and reviewed the treatment option for osteoporosis. She voiced good understanding.   Plan -Continue Herceptin every 4 weeks -colonoscopy -I will see her back in 2 months   ll questions were answered. The patient knows to call the clinic with any problems, questions or concerns. No barriers to learning was detected.  I spent 20 minutes counseling the patient face to face. The total time spent in the appointment was 25 minutes and more than 50% was on counseling and review of test results   Truitt Merle,  MD 11/10/2015

## 2015-12-06 ENCOUNTER — Other Ambulatory Visit: Payer: Self-pay | Admitting: Hematology

## 2015-12-08 ENCOUNTER — Other Ambulatory Visit (HOSPITAL_BASED_OUTPATIENT_CLINIC_OR_DEPARTMENT_OTHER): Payer: BLUE CROSS/BLUE SHIELD

## 2015-12-08 ENCOUNTER — Ambulatory Visit (HOSPITAL_BASED_OUTPATIENT_CLINIC_OR_DEPARTMENT_OTHER): Payer: BLUE CROSS/BLUE SHIELD

## 2015-12-08 VITALS — BP 123/73 | HR 82 | Temp 97.2°F | Resp 18

## 2015-12-08 DIAGNOSIS — C50411 Malignant neoplasm of upper-outer quadrant of right female breast: Secondary | ICD-10-CM

## 2015-12-08 DIAGNOSIS — Z5112 Encounter for antineoplastic immunotherapy: Secondary | ICD-10-CM

## 2015-12-08 LAB — COMPREHENSIVE METABOLIC PANEL
ALT: 15 U/L (ref 0–55)
AST: 16 U/L (ref 5–34)
Albumin: 3.9 g/dL (ref 3.5–5.0)
Alkaline Phosphatase: 137 U/L (ref 40–150)
Anion Gap: 11 mEq/L (ref 3–11)
BUN: 20.9 mg/dL (ref 7.0–26.0)
CHLORIDE: 106 meq/L (ref 98–109)
CO2: 27 meq/L (ref 22–29)
CREATININE: 0.9 mg/dL (ref 0.6–1.1)
Calcium: 9.8 mg/dL (ref 8.4–10.4)
EGFR: 66 mL/min/{1.73_m2} — ABNORMAL LOW (ref >90)
Glucose: 80 mg/dl (ref 70–140)
POTASSIUM: 4.2 meq/L (ref 3.5–5.1)
SODIUM: 144 meq/L (ref 136–145)
Total Bilirubin: 0.48 mg/dL (ref 0.20–1.20)
Total Protein: 7.5 g/dL (ref 6.4–8.3)

## 2015-12-08 LAB — CBC WITH DIFFERENTIAL/PLATELET
BASO%: 0.6 % (ref 0.0–2.0)
BASOS ABS: 0 10*3/uL (ref 0.0–0.1)
EOS%: 2.2 % (ref 0.0–7.0)
Eosinophils Absolute: 0.2 10*3/uL (ref 0.0–0.5)
HCT: 42.9 % (ref 34.8–46.6)
HGB: 13.8 g/dL (ref 11.6–15.9)
LYMPH#: 1.4 10*3/uL (ref 0.9–3.3)
LYMPH%: 18.1 % (ref 14.0–49.7)
MCH: 28.8 pg (ref 25.1–34.0)
MCHC: 32.3 g/dL (ref 31.5–36.0)
MCV: 89.1 fL (ref 79.5–101.0)
MONO#: 0.5 10*3/uL (ref 0.1–0.9)
MONO%: 6.1 % (ref 0.0–14.0)
NEUT#: 5.8 10*3/uL (ref 1.5–6.5)
NEUT%: 73 % (ref 38.4–76.8)
Platelets: 234 10*3/uL (ref 145–400)
RBC: 4.81 10*6/uL (ref 3.70–5.45)
RDW: 14.1 % (ref 11.2–14.5)
WBC: 7.9 10*3/uL (ref 3.9–10.3)

## 2015-12-08 MED ORDER — SODIUM CHLORIDE 0.9 % IJ SOLN
10.0000 mL | INTRAMUSCULAR | Status: DC | PRN
  Administered 2015-12-08: 10 mL
  Filled 2015-12-08: qty 10

## 2015-12-08 MED ORDER — DIPHENHYDRAMINE HCL 25 MG PO CAPS
25.0000 mg | ORAL_CAPSULE | Freq: Once | ORAL | Status: AC
  Administered 2015-12-08: 25 mg via ORAL

## 2015-12-08 MED ORDER — DIPHENHYDRAMINE HCL 25 MG PO CAPS
ORAL_CAPSULE | ORAL | Status: AC
  Filled 2015-12-08: qty 1

## 2015-12-08 MED ORDER — ACETAMINOPHEN 325 MG PO TABS
ORAL_TABLET | ORAL | Status: AC
  Filled 2015-12-08: qty 2

## 2015-12-08 MED ORDER — HEPARIN SOD (PORK) LOCK FLUSH 100 UNIT/ML IV SOLN
500.0000 [IU] | Freq: Once | INTRAVENOUS | Status: AC | PRN
  Administered 2015-12-08: 500 [IU]
  Filled 2015-12-08: qty 5

## 2015-12-08 MED ORDER — TRASTUZUMAB CHEMO INJECTION 440 MG
6.0000 mg/kg | Freq: Once | INTRAVENOUS | Status: AC
  Administered 2015-12-08: 525 mg via INTRAVENOUS
  Filled 2015-12-08: qty 25

## 2015-12-08 MED ORDER — ACETAMINOPHEN 325 MG PO TABS
650.0000 mg | ORAL_TABLET | Freq: Once | ORAL | Status: AC
  Administered 2015-12-08: 650 mg via ORAL

## 2015-12-08 MED ORDER — SODIUM CHLORIDE 0.9 % IV SOLN
Freq: Once | INTRAVENOUS | Status: AC
  Administered 2015-12-08: 10:00:00 via INTRAVENOUS

## 2015-12-17 ENCOUNTER — Other Ambulatory Visit: Payer: Self-pay | Admitting: Hematology

## 2016-01-05 ENCOUNTER — Telehealth: Payer: Self-pay | Admitting: Hematology

## 2016-01-05 ENCOUNTER — Encounter: Payer: Self-pay | Admitting: Hematology

## 2016-01-05 ENCOUNTER — Ambulatory Visit (HOSPITAL_BASED_OUTPATIENT_CLINIC_OR_DEPARTMENT_OTHER): Payer: BLUE CROSS/BLUE SHIELD | Admitting: Hematology

## 2016-01-05 ENCOUNTER — Ambulatory Visit (HOSPITAL_BASED_OUTPATIENT_CLINIC_OR_DEPARTMENT_OTHER): Payer: BLUE CROSS/BLUE SHIELD

## 2016-01-05 ENCOUNTER — Other Ambulatory Visit (HOSPITAL_BASED_OUTPATIENT_CLINIC_OR_DEPARTMENT_OTHER): Payer: BLUE CROSS/BLUE SHIELD

## 2016-01-05 VITALS — BP 133/83 | HR 83 | Temp 98.6°F | Resp 17 | Ht 64.0 in | Wt 195.6 lb

## 2016-01-05 DIAGNOSIS — Z5112 Encounter for antineoplastic immunotherapy: Secondary | ICD-10-CM | POA: Diagnosis not present

## 2016-01-05 DIAGNOSIS — C50411 Malignant neoplasm of upper-outer quadrant of right female breast: Secondary | ICD-10-CM

## 2016-01-05 DIAGNOSIS — Z79811 Long term (current) use of aromatase inhibitors: Secondary | ICD-10-CM | POA: Diagnosis not present

## 2016-01-05 DIAGNOSIS — Z170 Estrogen receptor positive status [ER+]: Secondary | ICD-10-CM | POA: Diagnosis not present

## 2016-01-05 DIAGNOSIS — F419 Anxiety disorder, unspecified: Secondary | ICD-10-CM | POA: Diagnosis not present

## 2016-01-05 LAB — CBC WITH DIFFERENTIAL/PLATELET
BASO%: 1.2 % (ref 0.0–2.0)
Basophils Absolute: 0.1 10*3/uL (ref 0.0–0.1)
EOS ABS: 0.2 10*3/uL (ref 0.0–0.5)
EOS%: 2.2 % (ref 0.0–7.0)
HCT: 38.4 % (ref 34.8–46.6)
HGB: 12.7 g/dL (ref 11.6–15.9)
LYMPH#: 1.1 10*3/uL (ref 0.9–3.3)
LYMPH%: 14.2 % (ref 14.0–49.7)
MCH: 28.8 pg (ref 25.1–34.0)
MCHC: 33.1 g/dL (ref 31.5–36.0)
MCV: 87.1 fL (ref 79.5–101.0)
MONO#: 0.3 10*3/uL (ref 0.1–0.9)
MONO%: 4.4 % (ref 0.0–14.0)
NEUT%: 78 % — AB (ref 38.4–76.8)
NEUTROS ABS: 6 10*3/uL (ref 1.5–6.5)
Platelets: 231 10*3/uL (ref 145–400)
RBC: 4.41 10*6/uL (ref 3.70–5.45)
RDW: 13.8 % (ref 11.2–14.5)
WBC: 7.7 10*3/uL (ref 3.9–10.3)

## 2016-01-05 LAB — COMPREHENSIVE METABOLIC PANEL
ALT: 15 U/L (ref 0–55)
AST: 14 U/L (ref 5–34)
Albumin: 3.6 g/dL (ref 3.5–5.0)
Alkaline Phosphatase: 124 U/L (ref 40–150)
Anion Gap: 10 mEq/L (ref 3–11)
BUN: 18.1 mg/dL (ref 7.0–26.0)
CHLORIDE: 108 meq/L (ref 98–109)
CO2: 25 meq/L (ref 22–29)
CREATININE: 0.9 mg/dL (ref 0.6–1.1)
Calcium: 9.4 mg/dL (ref 8.4–10.4)
EGFR: 74 mL/min/{1.73_m2} — ABNORMAL LOW (ref >90)
Glucose: 146 mg/dl — ABNORMAL HIGH (ref 70–140)
Potassium: 4.1 mEq/L (ref 3.5–5.1)
SODIUM: 143 meq/L (ref 136–145)
Total Bilirubin: 0.37 mg/dL (ref 0.20–1.20)
Total Protein: 6.8 g/dL (ref 6.4–8.3)

## 2016-01-05 MED ORDER — DIPHENHYDRAMINE HCL 25 MG PO CAPS
25.0000 mg | ORAL_CAPSULE | Freq: Once | ORAL | Status: AC
  Administered 2016-01-05: 25 mg via ORAL

## 2016-01-05 MED ORDER — TRASTUZUMAB CHEMO INJECTION 440 MG
6.0000 mg/kg | Freq: Once | INTRAVENOUS | Status: AC
  Administered 2016-01-05: 525 mg via INTRAVENOUS
  Filled 2016-01-05: qty 25

## 2016-01-05 MED ORDER — SODIUM CHLORIDE 0.9 % IJ SOLN
10.0000 mL | INTRAMUSCULAR | Status: DC | PRN
Start: 2016-01-05 — End: 2016-01-05
  Administered 2016-01-05: 10 mL
  Filled 2016-01-05: qty 10

## 2016-01-05 MED ORDER — DIPHENHYDRAMINE HCL 25 MG PO CAPS
ORAL_CAPSULE | ORAL | Status: AC
  Filled 2016-01-05: qty 1

## 2016-01-05 MED ORDER — SODIUM CHLORIDE 0.9 % IV SOLN
Freq: Once | INTRAVENOUS | Status: AC
  Administered 2016-01-05: 11:00:00 via INTRAVENOUS

## 2016-01-05 MED ORDER — ACETAMINOPHEN 325 MG PO TABS
650.0000 mg | ORAL_TABLET | Freq: Once | ORAL | Status: AC
Start: 2016-01-05 — End: 2016-01-05
  Administered 2016-01-05: 650 mg via ORAL

## 2016-01-05 MED ORDER — ACETAMINOPHEN 325 MG PO TABS
ORAL_TABLET | ORAL | Status: AC
  Filled 2016-01-05: qty 2

## 2016-01-05 MED ORDER — HEPARIN SOD (PORK) LOCK FLUSH 100 UNIT/ML IV SOLN
500.0000 [IU] | Freq: Once | INTRAVENOUS | Status: AC | PRN
  Administered 2016-01-05: 500 [IU]
  Filled 2016-01-05: qty 5

## 2016-01-05 NOTE — Telephone Encounter (Signed)
Gave patient avs report and appointments for Feb/March.  °

## 2016-01-05 NOTE — Patient Instructions (Signed)
[facility] Discharge Instructions for Patients Receiving Chemotherapy  Today you received the following chemotherapy agents: Herceptin   To help prevent nausea and vomiting after your treatment, we encourage you to take your nausea medication as directed.    If you develop nausea and vomiting that is not controlled by your nausea medication, call the clinic.   BELOW ARE SYMPTOMS THAT SHOULD BE REPORTED IMMEDIATELY:  *FEVER GREATER THAN 100.5 F  *CHILLS WITH OR WITHOUT FEVER  NAUSEA AND VOMITING THAT IS NOT CONTROLLED WITH YOUR NAUSEA MEDICATION  *UNUSUAL SHORTNESS OF BREATH  *UNUSUAL BRUISING OR BLEEDING  TENDERNESS IN MOUTH AND THROAT WITH OR WITHOUT PRESENCE OF ULCERS  *URINARY PROBLEMS  *BOWEL PROBLEMS  UNUSUAL RASH Items with * indicate a potential emergency and should be followed up as soon as possible.  Feel free to call the clinic you have any questions or concerns. The clinic phone number is [phone].  Please show the CHEMO ALERT CARD at check-in to the Emergency Department and triage nurse.   

## 2016-01-05 NOTE — Progress Notes (Signed)
[facility]  Telephone:(336) [phone] Fax:(336) [phone]  Clinic Follow up Note   Patient Care Team: [name], MD as PCP - General (Nurse Practitioner) 01/05/2016  SUMMARY OF ONCOLOGIC HISTORY:  Oncology History   Breast cancer of upper-outer quadrant of right female breast   Staging form: Breast, AJCC 7th Edition     Clinical: Stage IV (T2, N1, M1) - Signed by Truitt Merle, MD on 12/29/2014       Prognostic indicators: ER/PR/HER2NEU +      Pathologic: No stage assigned - Unsigned       Prognostic indicators: ER/PR/HER2NEU +        Breast cancer of upper-outer quadrant of right female breast ([place])   01/03/2014 Initial Diagnosis Breast cancer of upper-outer quadrant of right female breast, IDA, grade 2, ER 90%+, PR 1%+, her2 (+)   01/20/2014 PET scan Hypermetabolic mass in the posterior right breast (primary), 2 additional nodules in deep right breast (likely nodes) and 3 hyermetabolic portal nodes (lasrgest 2cm, SUV 13).    02/03/2014 - 05/20/2014 Neo-Adjuvant Chemotherapy docetaxel, Taxotere, Carboplatin, Herceptin, Perjeta for 6 cycles    06/01/2014 PET scan Minimal residual hypermetabolic activity within the residual mass inferolaterally in the right breast, likely surgical change. No  other hypermetabolic node or lesion.    06/10/2014 -  Anti-estrogen oral therapy Anastrozole 1 mg once daily   06/10/2014 -  Chemotherapy Maintenance Herceptin every 3 weeks   06/22/2014 Surgery right mastectomy with negative margines and axillary node dissection. ypT0N0, complete patholigical resposne.    05/10/2015 Imaging No convincing evidence of hypermetabolic metastatic disease.Right level 2 node is decreased in size and hypermetabolism. Favored to be reactive.    CURRENT THERAPY:  1.Trastuzumab 6 mg/kg every 3 weeks indefinitely, changed to every 4 weeks from Nov 2016 2. Anastrozole 1 mg once daily  INTERVAL HISTORY: Madeline Davidson returns for follow-up and Herceptin treatment. She  complains sinus drainage for the past month, most clear mucus, occasional yellownish and trace of blood. She denies any fever or chill, no headaches, facial congestion. She otherwise is doing well overall. She works full-time, has good appetite and energy level.  REVIEW OF SYSTEMS:   Constitutional: Denies fevers, chills or abnormal weight loss Eyes: Denies blurriness of vision Ears, nose, mouth, throat, and face: Denies mucositis or sore throat Respiratory: Denies cough, dyspnea or wheezes Cardiovascular: Denies palpitation, chest discomfort or lower extremity swelling Gastrointestinal:  Denies nausea, heartburn or change in bowel habits Skin: Denies abnormal skin rashes Lymphatics: Denies new lymphadenopathy or easy bruising Neurological:Denies numbness, tingling or new weaknesses Behavioral/Psych: Mood is stable, no new changes  All other systems were reviewed with the patient and are negative.  MEDICAL HISTORY:  Past Medical History  Diagnosis Date  . Allergy   . Wears glasses   . Breast cancer ([place]) 12/16/13     Invasive ductal Carcinoma; 1/1 nodes positive / self palpated  . PONV (postoperative nausea and vomiting)   . Anxiety   . History of blood transfusion ~ 04/2014    "related to chemo"  . Anemia ~ 04/2014  . Arthritis     back    SURGICAL HISTORY: Past Surgical History  Procedure Laterality Date  . Colon resection  2004    "for abscess on my colon"  . Hernia repair  2005    umb  . Cholecystectomy  2005  . Portacath placement Left 01/31/2014    Procedure: INSERTION PORT-A-CATH;  Surgeon: Rolm Bookbinder, MD;  Location: [facility];  Service: General;  Laterality: Left;  . Axillary lymph node biopsy Right 12/2013  . Breast biopsy Right 12/2013  . Colon surgery    . Mastectomy w/ sentinel node biopsy Right 06/22/2014    Procedure: RIGHT TOTAL MASTECTOMY WITH SENTINEL LYMPH NODE BIOPSY;  Surgeon: Rolm Bookbinder, MD;  Location: [facility];  Service: General;  Laterality: Right;      I have reviewed the social history and family history with the patient and they are unchanged from previous note.  ALLERGIES:  is allergic to ciprofloxacin.  MEDICATIONS:  Current Outpatient Prescriptions  Medication Sig Dispense Refill  . acetaminophen (TYLENOL) 500 MG tablet Take 1,000 mg by mouth every 6 (six) hours as needed for mild pain.    Marland Kitchen anastrozole (ARIMIDEX) 1 MG tablet TAKE 1 TABLET DAILY 90 tablet 3  . cetirizine (ZYRTEC) 10 MG tablet Take 10 mg by mouth daily as needed for allergies.    . clonazePAM (KLONOPIN) 0.5 MG tablet Take 1 tablet (0.5 mg total) by mouth 2 (two) times daily as needed for anxiety. 30 tablet 0  . ergocalciferol (DRISDOL) 50000 UNITS capsule Take 1 capsule (50,000 Units total) by mouth once a week. 6 capsule 0  . ibuprofen (ADVIL,MOTRIN) 800 MG tablet Take 1 tablet (800 mg total) by mouth every 8 (eight) hours as needed for moderate pain. 90 tablet 2  . lidocaine-prilocaine (EMLA) cream APPLY EXTERNALLY TO AFFECTED AREA(S) AS NEEDED 30 g 1  . lisinopril (ZESTRIL) 10 MG tablet Take 1 tablet (10 mg total) by mouth at bedtime. 30 tablet 6  . zolpidem (AMBIEN CR) 12.5 MG CR tablet Take 1 tablet (12.5 mg total) by mouth at bedtime as needed for sleep. 30 tablet 3   No current facility-administered medications for this visit.   Facility-Administered Medications Ordered in Other Visits  Medication Dose Route Frequency Provider Last Rate Last Dose  . sodium chloride 0.9 % injection 10 mL  10 mL Intracatheter PRN Truitt Merle, MD   10 mL at 09/15/15 1129    PHYSICAL EXAMINATION: ECOG PERFORMANCE STATUS: 0 - Asymptomatic  Filed Vitals:   01/05/16 0930  BP: 133/83  Pulse: 83  Temp: 98.6 F (37 C)  Resp: 17   Filed Weights   01/05/16 0930  Weight: 195 lb 9.6 oz (88.724 kg)    GENERAL:alert, no distress and comfortable SKIN: skin color, texture, turgor are normal, no rashes or significant lesions EYES: normal, Conjunctiva are pink and non-injected,  sclera clear OROPHARYNX:no exudate, no erythema and lips, buccal mucosa, and tongue normal  NECK: supple, thyroid normal size, non-tender, without nodularity LYMPH:  no palpable lymphadenopathy in the cervical, axillary or inguinal LUNGS: clear to auscultation and percussion with normal breathing effort HEART: regular rate & rhythm and no murmurs and no lower extremity edema ABDOMEN:abdomen soft, non-tender and normal bowel sounds, anus/rectal exam and inspection did not show any mass in the anus or distal rectum, no blood on the tip of glove  Musculoskeletal:no cyanosis of digits and no clubbing  NEURO: alert & oriented x 3 with fluent speech, no focal motor/sensory deficits Right breast surgically absent,  no palpable lesions on chest wall or axilla note. Surgical scar is well healed. Left breast exam no palpable mass, skin change or nipple discharge.  LABORATORY DATA:  I have reviewed the data as listed CBC Latest Ref Rng 01/05/2016 12/08/2015 11/10/2015  WBC 3.9 - 10.3 10e3/uL 7.7 7.9 7.8  Hemoglobin 11.6 - 15.9 g/dL 12.7 13.8 12.6  Hematocrit 34.8 - 46.6 %  38.4 42.9 37.7  Platelets 145 - 400 10e3/uL 231 234 240     CMP Latest Ref Rng 01/05/2016 12/08/2015 11/10/2015  Glucose 70 - 140 mg/dl 146(H) 80 104  BUN 7.0 - 26.0 mg/dL 18.1 20.9 17.0  Creatinine 0.6 - 1.1 mg/dL 0.9 0.9 0.8  Sodium 136 - 145 mEq/L 143 144 142  Potassium 3.5 - 5.1 mEq/L 4.1 4.2 4.2  Chloride 96 - 112 mEq/L - - -  CO2 22 - 29 mEq/L _0 Calcium 8.4 - 10.4 mg/dL 9.4 9.8 9.7  Total Protein 6.4 - 8.3 g/dL 6.8 7.5 6.8  Total Bilirubin 0.20 - 1.20 mg/dL 0.37 0.48 0.30  Alkaline Phos 40 - 150 U/L 124 137 130  AST 5 - 34 U/L _1 ALT 0 - 55 U/L _2 RADIOGRAPHIC STUDIES: I have personally reviewed the radiological images as listed and agreed with the findings in the report.  PET/CT on 11/07/2015 IMPRESSION: 1. No evidence of hypermetabolic recurrent or metastatic disease. Areas of  hypermetabolism within the right level 2 station of the neck and right chest wall have resolved. 2. Progressive posterior anal hypermetabolism again warrants physical exam correlation. 3. Other chronic findings, including multiple abdominal and pelvic wall hernias, as detailed above.  Echo 10/06/2015 IImpressions:  - Normal LV size with EF 60%. Strain and lateral s&' as above. Normal RV size and systolic function. No significant valvular abnormalities..  ASSESSMENT AND PLAN  60 year old postmenopausal woman, was found to have a 3.3 right breast mass and a positive axillary lymph nodes, biopsy confirmed invasive ductal carcinoma, ER positive PR positive HER-2 positive.  Her initial PET scan also reviewed 3 hypermetabolic nodes in periportal, measuring up to 2 cm with SUV 14. The periportal node was not bopsied, but likely represented metastatic nodes. She had completed response to neoadjuvant chemotherapy and underwent right mastectomy.  1. Metastatic right breast cancer to periportal nodes, ER+/PR+/HER2+ -Her case was reviewed in our tumor board previously. Her initial and a post treatment PET scan was reviewed again. Due to the size and metabolic activity of those periportal lymph nodes at diagnosis, she likely has metastatic breast cancer. And the consensus was to continue therapy indefinitely. -Although she had excellent compete response to chemotherapy, She is at VERY high risk for cancer recurrence due to the nature of metastatic disease. We recommend her to continue maintenance Herceptin and anastrozole indefinitely. She is little reluctant due to the financial stress (she has $6K annual deductible). After a lengthy discussion today, she agrees to continue. -Due to her work schedule, we have changed her Herceptin to every 4 weeks. - Repeat echocardiogram every 4-5 months when she is on Herceptin therapy. -We discussed the risk of osteoporocess when she is on anastrozole, she will  continue calcium and vitamin D supplements. Her last bone density scan from 09/2015 was normal.  -I encouraged her to continue exercise and remain to be active. -We will follow-up PET scan every 6 months if she is clinically doing well -I reviewed her lab results with her today.  2. Hypermetabolic uptake at anus -She has persistent hypermetabolic uptake at the anus from the past 2 PET scans -She is clinically asymptomatic, no pain, hematochezia or constipation. -rectal exam was negative for mass or bleeding. -She has not had colonoscopy. I strongly encouraged her to consider a colonoscopy  3. Anxiety and depression, insomnia  -Continue xanax as needed -She tried zoloft and lexapro but could  not tolerated.  -She is still on Ambien for insomnia  4. Vit D deficiency and bone health  -Her vitamin D level was 10 on 03/10/2015 -She received Vit D 50,000u weekly x6 weeks then 1000u daily  -I strongly encouraged her to continue taking calcium -Her DEXA scan showed normal bone density in 09/2015  -I discussed that anastrozole will likely weak her bone, and reviewed the treatment option for osteoporosis. She voiced good understanding.   Plan -Continue Herceptin every 4 weeks, continue anastrozole -echo in March  -colonoscopy -I will see her back in 2 months   All questions were answered. The patient knows to call the clinic with any problems, questions or concerns. No barriers to learning was detected.  I spent 20 minutes counseling the patient face to face. The total time spent in the appointment was 25 minutes and more than 50% was on counseling and review of test results   Truitt Merle, MD 01/05/2016

## 2016-01-08 ENCOUNTER — Telehealth: Payer: Self-pay | Admitting: Hematology

## 2016-01-08 NOTE — Telephone Encounter (Signed)
per pof to sch pt appt-per pof pt aware °

## 2016-01-24 ENCOUNTER — Telehealth (HOSPITAL_COMMUNITY): Payer: Self-pay | Admitting: Vascular Surgery

## 2016-01-24 NOTE — Telephone Encounter (Signed)
Left pt message to make f/u appt w/ ECHO 

## 2016-02-02 ENCOUNTER — Ambulatory Visit (HOSPITAL_BASED_OUTPATIENT_CLINIC_OR_DEPARTMENT_OTHER): Payer: BLUE CROSS/BLUE SHIELD

## 2016-02-02 ENCOUNTER — Encounter: Payer: Self-pay | Admitting: Hematology

## 2016-02-02 ENCOUNTER — Ambulatory Visit (HOSPITAL_BASED_OUTPATIENT_CLINIC_OR_DEPARTMENT_OTHER): Payer: BLUE CROSS/BLUE SHIELD | Admitting: Hematology

## 2016-02-02 ENCOUNTER — Telehealth: Payer: Self-pay | Admitting: Hematology

## 2016-02-02 ENCOUNTER — Other Ambulatory Visit (HOSPITAL_BASED_OUTPATIENT_CLINIC_OR_DEPARTMENT_OTHER): Payer: BLUE CROSS/BLUE SHIELD

## 2016-02-02 ENCOUNTER — Telehealth: Payer: Self-pay | Admitting: *Deleted

## 2016-02-02 VITALS — BP 157/78 | HR 91 | Temp 98.4°F | Resp 18 | Ht 64.0 in | Wt 196.5 lb

## 2016-02-02 DIAGNOSIS — F419 Anxiety disorder, unspecified: Secondary | ICD-10-CM

## 2016-02-02 DIAGNOSIS — F329 Major depressive disorder, single episode, unspecified: Secondary | ICD-10-CM | POA: Diagnosis not present

## 2016-02-02 DIAGNOSIS — Z79811 Long term (current) use of aromatase inhibitors: Secondary | ICD-10-CM | POA: Diagnosis not present

## 2016-02-02 DIAGNOSIS — G4700 Insomnia, unspecified: Secondary | ICD-10-CM

## 2016-02-02 DIAGNOSIS — C50411 Malignant neoplasm of upper-outer quadrant of right female breast: Secondary | ICD-10-CM

## 2016-02-02 DIAGNOSIS — I10 Essential (primary) hypertension: Secondary | ICD-10-CM

## 2016-02-02 DIAGNOSIS — Z5112 Encounter for antineoplastic immunotherapy: Secondary | ICD-10-CM

## 2016-02-02 DIAGNOSIS — E559 Vitamin D deficiency, unspecified: Secondary | ICD-10-CM

## 2016-02-02 LAB — CBC WITH DIFFERENTIAL/PLATELET
BASO%: 0.3 % (ref 0.0–2.0)
BASOS ABS: 0 10*3/uL (ref 0.0–0.1)
EOS%: 1 % (ref 0.0–7.0)
Eosinophils Absolute: 0.1 10*3/uL (ref 0.0–0.5)
HEMATOCRIT: 38.2 % (ref 34.8–46.6)
HGB: 12.6 g/dL (ref 11.6–15.9)
LYMPH#: 1.4 10*3/uL (ref 0.9–3.3)
LYMPH%: 15.4 % (ref 14.0–49.7)
MCH: 29.4 pg (ref 25.1–34.0)
MCHC: 33 g/dL (ref 31.5–36.0)
MCV: 89.3 fL (ref 79.5–101.0)
MONO#: 0.5 10*3/uL (ref 0.1–0.9)
MONO%: 5.9 % (ref 0.0–14.0)
NEUT#: 7 10*3/uL — ABNORMAL HIGH (ref 1.5–6.5)
NEUT%: 77.4 % — AB (ref 38.4–76.8)
PLATELETS: 220 10*3/uL (ref 145–400)
RBC: 4.28 10*6/uL (ref 3.70–5.45)
RDW: 13.4 % (ref 11.2–14.5)
WBC: 9.1 10*3/uL (ref 3.9–10.3)

## 2016-02-02 LAB — COMPREHENSIVE METABOLIC PANEL
ALT: 18 U/L (ref 0–55)
ANION GAP: 11 meq/L (ref 3–11)
AST: 14 U/L (ref 5–34)
Albumin: 3.5 g/dL (ref 3.5–5.0)
Alkaline Phosphatase: 113 U/L (ref 40–150)
BUN: 15.3 mg/dL (ref 7.0–26.0)
CALCIUM: 9.1 mg/dL (ref 8.4–10.4)
CHLORIDE: 108 meq/L (ref 98–109)
CO2: 25 meq/L (ref 22–29)
CREATININE: 0.8 mg/dL (ref 0.6–1.1)
EGFR: 80 mL/min/{1.73_m2} — AB (ref >90)
Glucose: 99 mg/dl (ref 70–140)
POTASSIUM: 3.8 meq/L (ref 3.5–5.1)
Sodium: 145 mEq/L (ref 136–145)
Total Bilirubin: 0.32 mg/dL (ref 0.20–1.20)
Total Protein: 6.6 g/dL (ref 6.4–8.3)

## 2016-02-02 MED ORDER — SODIUM CHLORIDE 0.9 % IV SOLN
Freq: Once | INTRAVENOUS | Status: AC
  Administered 2016-02-02: 09:00:00 via INTRAVENOUS

## 2016-02-02 MED ORDER — HEPARIN SOD (PORK) LOCK FLUSH 100 UNIT/ML IV SOLN
500.0000 [IU] | Freq: Once | INTRAVENOUS | Status: AC | PRN
  Administered 2016-02-02: 500 [IU]
  Filled 2016-02-02: qty 5

## 2016-02-02 MED ORDER — DIPHENHYDRAMINE HCL 25 MG PO CAPS
ORAL_CAPSULE | ORAL | Status: AC
  Filled 2016-02-02: qty 1

## 2016-02-02 MED ORDER — SODIUM CHLORIDE 0.9 % IJ SOLN
10.0000 mL | INTRAMUSCULAR | Status: DC | PRN
  Administered 2016-02-02: 10 mL
  Filled 2016-02-02: qty 10

## 2016-02-02 MED ORDER — ACETAMINOPHEN 325 MG PO TABS
650.0000 mg | ORAL_TABLET | Freq: Once | ORAL | Status: AC
  Administered 2016-02-02: 650 mg via ORAL

## 2016-02-02 MED ORDER — DIPHENHYDRAMINE HCL 25 MG PO CAPS
25.0000 mg | ORAL_CAPSULE | Freq: Once | ORAL | Status: AC
  Administered 2016-02-02: 25 mg via ORAL

## 2016-02-02 MED ORDER — ACETAMINOPHEN 325 MG PO TABS
ORAL_TABLET | ORAL | Status: AC
  Filled 2016-02-02: qty 2

## 2016-02-02 MED ORDER — TRASTUZUMAB CHEMO INJECTION 440 MG
6.0000 mg/kg | Freq: Once | INTRAVENOUS | Status: AC
  Administered 2016-02-02: 525 mg via INTRAVENOUS
  Filled 2016-02-02: qty 25

## 2016-02-02 NOTE — Telephone Encounter (Signed)
I have adjusted appts 

## 2016-02-02 NOTE — Progress Notes (Signed)
Madeline [facility]  Telephone:(336) [phone] Fax:(336) [phone]  Clinic Follow up Note   Patient Care Team: [name], MD as PCP - General (Nurse Practitioner) 02/02/2016  SUMMARY OF ONCOLOGIC HISTORY:  Oncology History   Breast cancer of upper-outer quadrant of right female breast   Staging form: Breast, AJCC 7th Edition     Clinical: Stage IV (T2, N1, M1) - Signed by Madeline Merle, MD on 12/29/2014       Prognostic indicators: ER/PR/HER2NEU +      Pathologic: No stage assigned - Unsigned       Prognostic indicators: ER/PR/HER2NEU +        Breast cancer of upper-outer quadrant of right female breast ([place])   01/03/2014 Initial Diagnosis Breast cancer of upper-outer quadrant of right female breast, IDA, grade 2, ER 90%+, PR 1%+, her2 (+)   01/20/2014 PET scan Hypermetabolic mass in the posterior right breast (primary), 2 additional nodules in deep right breast (likely nodes) and 3 hyermetabolic portal nodes (lasrgest 2cm, SUV 13).    02/03/2014 - 05/20/2014 Neo-Adjuvant Chemotherapy docetaxel, Taxotere, Carboplatin, Herceptin, Perjeta for 6 cycles    06/01/2014 PET scan Minimal residual hypermetabolic activity within the residual mass inferolaterally in the right breast, likely surgical change. No  other hypermetabolic node or lesion.    06/10/2014 -  Anti-estrogen oral therapy Anastrozole 1 mg once daily   06/10/2014 -  Chemotherapy Maintenance Herceptin every 3 weeks   06/22/2014 Surgery right mastectomy with negative margines and axillary node dissection. ypT0N0, complete patholigical resposne.    05/10/2015 Imaging No convincing evidence of hypermetabolic metastatic disease.Right level 2 node is decreased in size and hypermetabolism. Favored to be reactive.    CURRENT THERAPY:  1.Trastuzumab 6 mg/kg every 3 weeks indefinitely, changed to every 4 weeks from Nov 2016 2. Anastrozole 1 mg once daily  INTERVAL HISTORY: Madeline Davidson returns for follow-up and Herceptin treatment. She had  episode of nausea, vomiting, and stomach discomfort last weekend, no fever or chills or diarrhea, she felt was a virus infection, and her symptom has much improved spontaneously. She otherwise feels well, no other new complains.  REVIEW OF SYSTEMS:   Constitutional: Denies fevers, chills or abnormal weight loss Eyes: Denies blurriness of vision Ears, nose, mouth, throat, and face: Denies mucositis or sore throat Respiratory: Denies cough, dyspnea or wheezes Cardiovascular: Denies palpitation, chest discomfort or lower extremity swelling Gastrointestinal:  Denies nausea, heartburn or change in bowel habits Skin: Denies abnormal skin rashes Lymphatics: Denies new lymphadenopathy or easy bruising Neurological:Denies numbness, tingling or new weaknesses Behavioral/Psych: Davidson is stable, no new changes  All other systems were reviewed with the patient and are negative.  MEDICAL HISTORY:  Past Medical History  Diagnosis Date  . Allergy   . Wears glasses   . Breast cancer (Madeline Davidson) 12/16/13     Invasive ductal Carcinoma; 1/1 nodes positive / self palpated  . PONV (postoperative nausea and vomiting)   . Anxiety   . History of blood transfusion ~ 04/2014    "related to chemo"  . Anemia ~ 04/2014  . Arthritis     back    SURGICAL HISTORY: Past Surgical History  Procedure Laterality Date  . Colon resection  2004    "for abscess on my colon"  . Hernia repair  2005    umb  . Cholecystectomy  2005  . Portacath placement Left 01/31/2014    Procedure: INSERTION PORT-A-CATH;  Surgeon: Madeline Bookbinder, MD;  Location: [facility];  Service: General;  Laterality:  Left;  . Axillary lymph node biopsy Right 12/2013  . Breast biopsy Right 12/2013  . Colon surgery    . Mastectomy w/ sentinel node biopsy Right 06/22/2014    Procedure: RIGHT TOTAL MASTECTOMY WITH SENTINEL LYMPH NODE BIOPSY;  Surgeon: Madeline Loron, MD;  Location: [facility];  Service: General;  Laterality: Right;    I have reviewed the social  history and family history with the patient and they are unchanged from previous note.  ALLERGIES:  is allergic to ciprofloxacin.  MEDICATIONS:  Current Outpatient Prescriptions  Medication Sig Dispense Refill  . acetaminophen (TYLENOL) 500 MG tablet Take 1,000 mg by mouth every 6 (six) hours as needed for mild pain.    Marland Kitchen anastrozole (ARIMIDEX) 1 MG tablet TAKE 1 TABLET DAILY 90 tablet 3  . cetirizine (ZYRTEC) 10 MG tablet Take 10 mg by mouth daily as needed for allergies.    . clonazePAM (KLONOPIN) 0.5 MG tablet Take 1 tablet (0.5 mg total) by mouth 2 (two) times daily as needed for anxiety. 30 tablet 0  . ergocalciferol (DRISDOL) 50000 UNITS capsule Take 1 capsule (50,000 Units total) by mouth once a week. 6 capsule 0  . ibuprofen (ADVIL,MOTRIN) 800 MG tablet Take 1 tablet (800 mg total) by mouth every 8 (eight) hours as needed for moderate pain. 90 tablet 2  . lidocaine-prilocaine (EMLA) cream APPLY EXTERNALLY TO AFFECTED AREA(S) AS NEEDED 30 g 1  . lisinopril (ZESTRIL) 10 MG tablet Take 1 tablet (10 mg total) by mouth at bedtime. 30 tablet 6  . zolpidem (AMBIEN CR) 12.5 MG CR tablet Take 1 tablet (12.5 mg total) by mouth at bedtime as needed for sleep. 30 tablet 3   No current facility-administered medications for this visit.   Facility-Administered Medications Ordered in Other Visits  Medication Dose Route Frequency Provider Last Rate Last Dose  . heparin lock flush 100 unit/mL  500 Units Intracatheter Once PRN Madeline Mood, MD      . sodium chloride 0.9 % injection 10 mL  10 mL Intracatheter PRN Madeline Mood, MD   10 mL at 09/15/15 1129  . sodium chloride 0.9 % injection 10 mL  10 mL Intracatheter PRN Madeline Mood, MD      . trastuzumab (HERCEPTIN) 525 mg in sodium chloride 0.9 % 250 mL chemo infusion  6 mg/kg (Treatment Plan Actual) Intravenous Once Madeline Mood, MD 550 mL/hr at 02/02/16 0859 525 mg at 02/02/16 0859    PHYSICAL EXAMINATION: ECOG PERFORMANCE STATUS: 0 - Asymptomatic  Filed  Vitals:   02/02/16 0827  BP: 157/78  Pulse: 91  Temp: 98.4 F (36.9 C)  Resp: 18   Filed Weights   02/02/16 0827  Weight: 196 lb 8 oz (89.132 kg)    GENERAL:alert, no distress and comfortable SKIN: skin color, texture, turgor are normal, no rashes or significant lesions EYES: normal, Conjunctiva are pink and non-injected, sclera clear OROPHARYNX:no exudate, no erythema and lips, buccal mucosa, and tongue normal  NECK: supple, thyroid normal size, non-tender, without nodularity LYMPH:  no palpable lymphadenopathy in the cervical, axillary or inguinal LUNGS: clear to auscultation and percussion with normal breathing effort HEART: regular rate & rhythm and no murmurs and no lower extremity edema ABDOMEN:abdomen soft, non-tender and normal bowel sounds, anus/rectal exam and inspection did not show any mass in the anus or distal rectum, no blood on the tip of glove  Musculoskeletal:no cyanosis of digits and no clubbing  NEURO: alert & oriented x 3 with fluent speech, no focal motor/sensory  deficits Right breast surgically absent,  no palpable lesions on chest wall or axilla note. Surgical scar is well healed. Left breast exam no palpable mass, skin change or nipple discharge.  LABORATORY DATA:  I have reviewed the data as listed CBC Latest Ref Rng 02/02/2016 01/05/2016 12/08/2015  WBC 3.9 - 10.3 10e3/uL 9.1 7.7 7.9  Hemoglobin 11.6 - 15.9 g/dL 12.6 12.7 13.8  Hematocrit 34.8 - 46.6 % 38.2 38.4 42.9  Platelets 145 - 400 10e3/uL 220 231 234     CMP Latest Ref Rng 02/02/2016 01/05/2016 12/08/2015  Glucose 70 - 140 mg/dl 99 146(H) 80  BUN 7.0 - 26.0 mg/dL 15.3 18.1 20.9  Creatinine 0.6 - 1.1 mg/dL 0.8 0.9 0.9  Sodium 136 - 145 mEq/L 145 143 144  Potassium 3.5 - 5.1 mEq/L 3.8 4.1 4.2  CO2 22 - 29 mEq/L '25 25 27  '$ Calcium 8.4 - 10.4 mg/dL 9.1 9.4 9.8  Total Protein 6.4 - 8.3 g/dL 6.6 6.8 7.5  Total Bilirubin 0.20 - 1.20 mg/dL 0.32 0.37 0.48  Alkaline Phos 40 - 150 U/L 113 124 137  AST  5 - 34 U/L '14 14 16  '$ ALT 0 - 55 U/L '18 15 15      '$ RADIOGRAPHIC STUDIES: I have personally reviewed the radiological images as listed and agreed with the findings in the report.  PET/CT on 11/07/2015 IMPRESSION: 1. No evidence of hypermetabolic recurrent or metastatic disease. Areas of hypermetabolism within the right level 2 station of the neck and right chest wall have resolved. 2. Progressive posterior anal hypermetabolism again warrants physical exam correlation. 3. Other chronic findings, including multiple abdominal and pelvic wall hernias, as detailed above.  Echo 10/06/2015 IImpressions:  - Normal LV size with EF 60%. Strain and lateral s&' as above. Normal RV size and systolic function. No significant valvular abnormalities..  ASSESSMENT AND PLAN  60 year old postmenopausal woman, was found to have a 3.3 right breast mass and a positive axillary lymph nodes, biopsy confirmed invasive ductal carcinoma, ER positive PR positive HER-2 positive.  Her initial PET scan also reviewed 3 hypermetabolic nodes in periportal, measuring up to 2 cm with SUV 14. The periportal node was not bopsied, but likely represented metastatic nodes. She had completed response to neoadjuvant chemotherapy and underwent right mastectomy.  1. Metastatic right breast cancer to periportal nodes, ER+/PR+/HER2+ -Her case was reviewed in our tumor board previously. Her initial and a post treatment PET scan was reviewed again. Due to the size and metabolic activity of those periportal lymph nodes at diagnosis, she likely has metastatic breast cancer. And the consensus was to continue therapy indefinitely. -Although she had excellent compete response to chemotherapy, She is at VERY high risk for cancer recurrence due to the nature of metastatic disease. We recommend her to continue maintenance Herceptin and anastrozole indefinitely. She is little reluctant due to the financial stress (she has $6K annual  deductible). After a lengthy discussion today, she agrees to continue. -Due to her work schedule, we have changed her Herceptin to every 4 weeks. - Repeat echocardiogram every 4-5 months when she is on Herceptin therapy, she is due, will contact her cardiologist for echo and visit  -We discussed the risk of osteoporocess when she is on anastrozole, she will continue calcium and vitamin D supplements. Her last bone density scan from 09/2015 was normal.  -I encouraged her to continue exercise and remain to be active. -We will follow-up PET scan every 6 months if she is clinically doing well -  I reviewed her lab results with her today.  2. Hypermetabolic uptake at anus -She has persistent hypermetabolic uptake at the anus from the past 2 PET scans -She is clinically asymptomatic, no pain, hematochezia or constipation. -rectal exam was negative for mass or bleeding. -She has not had colonoscopy. I strongly encouraged her to consider a colonoscopy, she is agreeable now   3. Anxiety and depression, insomnia  -Continue xanax as needed -She tried zoloft and lexapro but could not tolerated.  -She is still on Ambien for insomnia  4. Vit D deficiency and bone health  -Her vitamin D level was 10 on 03/10/2015 -She received Vit D 50,000u weekly x6 weeks then 1000u daily  -I strongly encouraged her to continue taking calcium -Her DEXA scan showed normal bone density in 09/2015  -I discussed that anastrozole will likely weak her bone, and reviewed the treatment option for osteoporosis. She voiced good understanding.   Plan -Continue Herceptin every 4 weeks, continue anastrozole -echo in March, will contact her cardiologist Dr. Letha Cape office for appointment  -colonoscopy, will refer her to [facility]  -I will see her back in 2 months, will order PET scan on next visit   All questions were answered. The patient knows to call the clinic with any problems, questions or concerns. No barriers to  learning was detected.  I spent 20 minutes counseling the patient face to face. The total time spent in the appointment was 25 minutes and more than 50% was on counseling and review of test results   Madeline Merle, MD 02/02/2016

## 2016-02-02 NOTE — Telephone Encounter (Signed)
per pof to sch pt appt-Per Dr Burr Medico to move appt to 4/7-sent M[facility] email to move trmt to coordinate w/MD appt-pt to get updated copy b4 leaving

## 2016-02-02 NOTE — Progress Notes (Signed)
Pt seen by Dr.Feng in the infusion room today. Pt and MD discussed about future appt with GI Dr and colonoscopy procedure, as well as echocardiogram. Pt will now be seen every 2 months by Dr.Feng.  Pt verbalized understanding and plan of care.

## 2016-02-02 NOTE — Patient Instructions (Signed)
[facility] Discharge Instructions for Patients Receiving Chemotherapy  Today you received the following chemotherapy agents: Herceptin   To help prevent nausea and vomiting after your treatment, we encourage you to take your nausea medication as directed.    If you develop nausea and vomiting that is not controlled by your nausea medication, call the clinic.   BELOW ARE SYMPTOMS THAT SHOULD BE REPORTED IMMEDIATELY:  *FEVER GREATER THAN 100.5 F  *CHILLS WITH OR WITHOUT FEVER  NAUSEA AND VOMITING THAT IS NOT CONTROLLED WITH YOUR NAUSEA MEDICATION  *UNUSUAL SHORTNESS OF BREATH  *UNUSUAL BRUISING OR BLEEDING  TENDERNESS IN MOUTH AND THROAT WITH OR WITHOUT PRESENCE OF ULCERS  *URINARY PROBLEMS  *BOWEL PROBLEMS  UNUSUAL RASH Items with * indicate a potential emergency and should be followed up as soon as possible.  Feel free to call the clinic you have any questions or concerns. The clinic phone number is [phone].  Please show the CHEMO ALERT CARD at check-in to the Emergency Department and triage nurse.   

## 2016-02-05 ENCOUNTER — Encounter (HOSPITAL_COMMUNITY): Payer: Self-pay | Admitting: Vascular Surgery

## 2016-02-07 ENCOUNTER — Encounter: Payer: Self-pay | Admitting: Gastroenterology

## 2016-02-15 ENCOUNTER — Telehealth (HOSPITAL_COMMUNITY): Payer: Self-pay | Admitting: Vascular Surgery

## 2016-02-15 NOTE — Telephone Encounter (Signed)
Left pt message to make f/u appt 

## 2016-02-26 ENCOUNTER — Telehealth: Payer: Self-pay | Admitting: *Deleted

## 2016-02-26 NOTE — Telephone Encounter (Signed)
-----   Message from Scarlette Calico, RN sent at 02/20/2016  1:43 PM EST ----- Hey we have left multiple messages and even mailed her a letter to call us to sch but she has not done so.  I guess the next time she is there maybe you guys can call us to get her scheduled. ----- Message -----    From: Jesse Fall, RN    Sent: 02/13/2016   2:45 PM      To: Scarlette Calico, RN  Heather, I reached pt today on her home ph # [phone] & asked her to call your office.  She stated she received something in the mail this sat from your office & just hadn't called you yet but she will.  ----- Message -----    From: Scarlette Calico, RN    Sent: 02/08/2016  10:34 AM      To: Jesse Fall, RN  Still unable to reach this pt, we have sent her a letter to call us to schedule, thanks ----- Message -----    From: Jesse Fall, RN    Sent: 02/02/2016  10:49 AM      To: Scarlette Calico, RN, Truitt Merle, MD, #  Looks like this pt needs an ECHO in March.  Per Dr. Burr Medico, in next 3-4 weeks.  Thanks, Jesse Fall RN

## 2016-02-26 NOTE — Telephone Encounter (Signed)
Just called pt & she states that she called this am & has ECHO scheduled.

## 2016-03-01 ENCOUNTER — Ambulatory Visit: Payer: BLUE CROSS/BLUE SHIELD | Admitting: Hematology

## 2016-03-01 ENCOUNTER — Ambulatory Visit (HOSPITAL_BASED_OUTPATIENT_CLINIC_OR_DEPARTMENT_OTHER): Payer: BLUE CROSS/BLUE SHIELD

## 2016-03-01 ENCOUNTER — Other Ambulatory Visit (HOSPITAL_BASED_OUTPATIENT_CLINIC_OR_DEPARTMENT_OTHER): Payer: BLUE CROSS/BLUE SHIELD

## 2016-03-01 VITALS — BP 149/80 | HR 88 | Temp 98.4°F

## 2016-03-01 DIAGNOSIS — C50411 Malignant neoplasm of upper-outer quadrant of right female breast: Secondary | ICD-10-CM

## 2016-03-01 DIAGNOSIS — Z5112 Encounter for antineoplastic immunotherapy: Secondary | ICD-10-CM

## 2016-03-01 LAB — CBC WITH DIFFERENTIAL/PLATELET
BASO%: 1 % (ref 0.0–2.0)
Basophils Absolute: 0.1 10*3/uL (ref 0.0–0.1)
EOS%: 2 % (ref 0.0–7.0)
Eosinophils Absolute: 0.1 10*3/uL (ref 0.0–0.5)
HEMATOCRIT: 41.4 % (ref 34.8–46.6)
HEMOGLOBIN: 13.7 g/dL (ref 11.6–15.9)
LYMPH#: 1.4 10*3/uL (ref 0.9–3.3)
LYMPH%: 18.4 % (ref 14.0–49.7)
MCH: 29.1 pg (ref 25.1–34.0)
MCHC: 33 g/dL (ref 31.5–36.0)
MCV: 88 fL (ref 79.5–101.0)
MONO#: 0.4 10*3/uL (ref 0.1–0.9)
MONO%: 5.5 % (ref 0.0–14.0)
NEUT%: 73.1 % (ref 38.4–76.8)
NEUTROS ABS: 5.4 10*3/uL (ref 1.5–6.5)
PLATELETS: 244 10*3/uL (ref 145–400)
RBC: 4.7 10*6/uL (ref 3.70–5.45)
RDW: 13.8 % (ref 11.2–14.5)
WBC: 7.4 10*3/uL (ref 3.9–10.3)

## 2016-03-01 LAB — COMPREHENSIVE METABOLIC PANEL
ALT: 13 U/L (ref 0–55)
ANION GAP: 9 meq/L (ref 3–11)
AST: 15 U/L (ref 5–34)
Albumin: 3.8 g/dL (ref 3.5–5.0)
Alkaline Phosphatase: 127 U/L (ref 40–150)
BUN: 18.5 mg/dL (ref 7.0–26.0)
CHLORIDE: 107 meq/L (ref 98–109)
CO2: 26 meq/L (ref 22–29)
CREATININE: 0.9 mg/dL (ref 0.6–1.1)
Calcium: 9.6 mg/dL (ref 8.4–10.4)
EGFR: 75 mL/min/{1.73_m2} — ABNORMAL LOW (ref >90)
Glucose: 131 mg/dl (ref 70–140)
Potassium: 4.5 mEq/L (ref 3.5–5.1)
Sodium: 142 mEq/L (ref 136–145)
Total Bilirubin: 0.44 mg/dL (ref 0.20–1.20)
Total Protein: 7 g/dL (ref 6.4–8.3)

## 2016-03-01 MED ORDER — DIPHENHYDRAMINE HCL 25 MG PO CAPS
ORAL_CAPSULE | ORAL | Status: AC
  Filled 2016-03-01: qty 1

## 2016-03-01 MED ORDER — SODIUM CHLORIDE 0.9 % IJ SOLN
10.0000 mL | INTRAMUSCULAR | Status: DC | PRN
  Administered 2016-03-01: 10 mL
  Filled 2016-03-01: qty 10

## 2016-03-01 MED ORDER — DIPHENHYDRAMINE HCL 25 MG PO CAPS
25.0000 mg | ORAL_CAPSULE | Freq: Once | ORAL | Status: AC
  Administered 2016-03-01: 25 mg via ORAL

## 2016-03-01 MED ORDER — SODIUM CHLORIDE 0.9 % IV SOLN
Freq: Once | INTRAVENOUS | Status: AC
  Administered 2016-03-01: 09:00:00 via INTRAVENOUS

## 2016-03-01 MED ORDER — TRASTUZUMAB CHEMO INJECTION 440 MG
6.0000 mg/kg | Freq: Once | INTRAVENOUS | Status: AC
  Administered 2016-03-01: 525 mg via INTRAVENOUS
  Filled 2016-03-01: qty 25

## 2016-03-01 MED ORDER — HEPARIN SOD (PORK) LOCK FLUSH 100 UNIT/ML IV SOLN
500.0000 [IU] | Freq: Once | INTRAVENOUS | Status: AC | PRN
  Administered 2016-03-01: 500 [IU]
  Filled 2016-03-01: qty 5

## 2016-03-01 MED ORDER — ACETAMINOPHEN 325 MG PO TABS
ORAL_TABLET | ORAL | Status: AC
  Filled 2016-03-01: qty 2

## 2016-03-01 MED ORDER — ACETAMINOPHEN 325 MG PO TABS
650.0000 mg | ORAL_TABLET | Freq: Once | ORAL | Status: AC
  Administered 2016-03-01: 650 mg via ORAL

## 2016-03-01 NOTE — Patient Instructions (Signed)
[facility] Discharge Instructions for Patients Receiving Chemotherapy  Today you received the following chemotherapy agents:  Herceptin  To help prevent nausea and vomiting after your treatment, we encourage you to take your nausea medication as prescribed.   If you develop nausea and vomiting that is not controlled by your nausea medication, call the clinic.   BELOW ARE SYMPTOMS THAT SHOULD BE REPORTED IMMEDIATELY:  *FEVER GREATER THAN 100.5 F  *CHILLS WITH OR WITHOUT FEVER  NAUSEA AND VOMITING THAT IS NOT CONTROLLED WITH YOUR NAUSEA MEDICATION  *UNUSUAL SHORTNESS OF BREATH  *UNUSUAL BRUISING OR BLEEDING  TENDERNESS IN MOUTH AND THROAT WITH OR WITHOUT PRESENCE OF ULCERS  *URINARY PROBLEMS  *BOWEL PROBLEMS  UNUSUAL RASH Items with * indicate a potential emergency and should be followed up as soon as possible.  Feel free to call the clinic you have any questions or concerns. The clinic phone number is [phone].  Please show the CHEMO ALERT CARD at check-in to the Emergency Department and triage nurse.   

## 2016-03-18 ENCOUNTER — Ambulatory Visit (HOSPITAL_COMMUNITY)
Admission: RE | Admit: 2016-03-18 | Discharge: 2016-03-18 | Disposition: A | Discharge status: Home / Self Care | Payer: BLUE CROSS/BLUE SHIELD | Source: Ambulatory Visit | Attending: Nurse Practitioner | Admitting: Nurse Practitioner

## 2016-03-18 ENCOUNTER — Ambulatory Visit (HOSPITAL_BASED_OUTPATIENT_CLINIC_OR_DEPARTMENT_OTHER)
Admission: RE | Admit: 2016-03-18 | Discharge: 2016-03-18 | Disposition: A | Discharge status: Home / Self Care | Payer: BLUE CROSS/BLUE SHIELD | Source: Ambulatory Visit | Attending: Internal Medicine | Admitting: Internal Medicine

## 2016-03-18 VITALS — BP 124/78 | HR 92 | Wt 196.5 lb

## 2016-03-18 DIAGNOSIS — F419 Anxiety disorder, unspecified: Secondary | ICD-10-CM | POA: Diagnosis not present

## 2016-03-18 DIAGNOSIS — C50411 Malignant neoplasm of upper-outer quadrant of right female breast: Secondary | ICD-10-CM | POA: Insufficient documentation

## 2016-03-18 DIAGNOSIS — Z79899 Other long term (current) drug therapy: Secondary | ICD-10-CM | POA: Diagnosis not present

## 2016-03-18 DIAGNOSIS — E669 Obesity, unspecified: Secondary | ICD-10-CM | POA: Diagnosis not present

## 2016-03-18 DIAGNOSIS — I10 Essential (primary) hypertension: Secondary | ICD-10-CM | POA: Insufficient documentation

## 2016-03-18 DIAGNOSIS — Z6833 Body mass index (BMI) 33.0-33.9, adult: Secondary | ICD-10-CM | POA: Diagnosis not present

## 2016-03-18 MED ORDER — LISINOPRIL 10 MG PO TABS: 10.0000 mg | ORAL_TABLET | Freq: Every day | ORAL | Status: DC

## 2016-03-18 NOTE — Progress Notes (Signed)
  Echocardiogram 2D Echocardiogram has been performed.  Gilberta Peeters 03/18/2016, 10:02 AM

## 2016-03-18 NOTE — Patient Instructions (Signed)
We will contact you in 4 months to schedule your next appointment.  

## 2016-03-18 NOTE — Addendum Note (Signed)
Encounter addended by: Scarlette Calico, RN on: 03/18/2016 11:01 AM<BR>     Documentation filed: Dx Association, Patient Instructions Section, Orders

## 2016-03-18 NOTE — Progress Notes (Signed)
CARDIO-ONCOLOGY CLINIC NOTE Patient ID: Madeline Davidson, female   DOB: [birth year], 60 y.o.   MRN: [id number] Oncologist: Dr Burr Medico PCP: Dr Norma Fredrickson  HPI:  [name] is a 60 year old female with HTN, obesity and right breast cancer in the lower outer quadrant stage II (T2, N1). She was referred to the cardio-oncology clinic by Dr Humphrey Rolls   She started chemo 02/03/14. Has finished chemo.  S/p mastectomy in July 2015. Getting Herceptin indefinitely for metastatic disease, interval has been extended out to 4 weeks.   At last visit BP up so lisinopril restarted at night. Tolerating well.   Tolerating Herceptin fairly well. Does have some diarrhea and has colonoscopy scheduled with Dr. Ardis Hughs. No HF symptoms. No dyspnea or chest pain.   Has f/u PET scan in May.     ECHO 01/24/14 EF 60-65% Lateral S' 9.8 ECHO 6/15 EF 60-65%, lateral S' 12.8, strain not done Echo 10/14/14  EF 60-65% lateral s' 11.1 GLS -23.8% Echo 2/16 EF 55-60%, poor strain tracking Echo 5/16 EF 60%, lateral s' 13.9 cm/sec, GLS -19% (poor images for strain) Echo 10/16 EF 60%, lateral s' 10.8, GLS -20.8%, normal RV size and systolic function Echo [id number] EF 60-65%, lateral s' 10.9, GLS -18.2%, normal RV size and systolic function. Grade I DD  FH: Uncle has HF. Mom has HTN   SH: Lives with her Mom. Works full time [employer]: All systems reviewed and negative except as per HPI.   Past Medical History  Diagnosis Date  . Allergy   . Wears glasses   . Breast cancer ([place]) 12/16/13     Invasive ductal Carcinoma; 1/1 nodes positive / self palpated  . PONV (postoperative nausea and vomiting)   . Anxiety   . History of blood transfusion ~ 04/2014    "related to chemo"  . Anemia ~ 04/2014  . Arthritis     back    Current Outpatient Prescriptions  Medication Sig Dispense Refill  . acetaminophen (TYLENOL) 500 MG tablet Take 1,000 mg by mouth every 6 (six) hours as needed for mild pain.    Marland Kitchen anastrozole (ARIMIDEX) 1 MG  tablet TAKE 1 TABLET DAILY 90 tablet 3  . cetirizine (ZYRTEC) 10 MG tablet Take 10 mg by mouth daily as needed for allergies.    . clonazePAM (KLONOPIN) 0.5 MG tablet Take 1 tablet (0.5 mg total) by mouth 2 (two) times daily as needed for anxiety. 30 tablet 0  . ergocalciferol (DRISDOL) 50000 UNITS capsule Take 1 capsule (50,000 Units total) by mouth once a week. 6 capsule 0  . ibuprofen (ADVIL,MOTRIN) 800 MG tablet Take 1 tablet (800 mg total) by mouth every 8 (eight) hours as needed for moderate pain. 90 tablet 2  . lidocaine-prilocaine (EMLA) cream APPLY EXTERNALLY TO AFFECTED AREA(S) AS NEEDED 30 g 1  . lisinopril (ZESTRIL) 10 MG tablet Take 1 tablet (10 mg total) by mouth at bedtime. 30 tablet 6  . zolpidem (AMBIEN CR) 12.5 MG CR tablet Take 1 tablet (12.5 mg total) by mouth at bedtime as needed for sleep. 30 tablet 3   No current facility-administered medications for this encounter.   Facility-Administered Medications Ordered in Other Encounters  Medication Dose Route Frequency Provider Last Rate Last Dose  . sodium chloride 0.9 % injection 10 mL  10 mL Intracatheter PRN Truitt Merle, MD   10 mL at 09/15/15 1129     Allergies  Allergen Reactions  . Ciprofloxacin Other (See Comments)    Patient had  a headache after taking Cipro. Made her sinus infection symptoms worse.    Social History   Social History  . Marital Status: Divorced    Spouse Name: N/A  . Number of Children: N/A  . Years of Education: N/A   Occupational History  . Not on file.   Social History Main Topics  . Smoking status: Never Smoker   . Smokeless tobacco: Never Used  . Alcohol Use: No  . Drug Use: No  . Sexual Activity: No   Other Topics Concern  . Not on file   Social History Narrative    Family History  Problem Relation Age of Onset  . Cancer Father 84    colon cancer  . Hypertension Mother     PHYSICAL EXAM: Filed Vitals:   03/18/16 1030  BP: 124/78  Pulse: 92   General:  Well  appearing. No respiratory difficulty HEENT: normal Neck: supple. no JVD. Carotids 2+ bilat; no bruits. No lymphadenopathy or thryomegaly appreciated. Cor: s/p R mastectomy. PMI nondisplaced. Regular rate & rhythm. No rubs, gallops or murmurs. Lungs: clear Abdomen: soft, nontender, nondistended. No hepatosplenomegaly. No bruits or masses. Good bowel sounds. Extremities: no cyanosis, clubbing, rash, edema Neuro: alert & oriented x 3, cranial nerves grossly intact. moves all 4 extremities w/o difficulty. Affect pleasant.  ASSESSMENT & PLAN: 1. Breast Cancer: Metastatic.  She will be getting Herceptin q4 wks indefinitely.  Echo today showed stable EF and strain.  Continue Herceptin, repeat echo in 4 months with followup. 2. HTN: Much improved. Continue lisinopril qhs.  Kimbery Harwood,MD 03/18/2016

## 2016-03-26 ENCOUNTER — Telehealth: Payer: Self-pay

## 2016-03-26 ENCOUNTER — Ambulatory Visit (AMBULATORY_SURGERY_CENTER): Payer: Self-pay

## 2016-03-26 VITALS — Ht 64.0 in | Wt 197.4 lb

## 2016-03-26 DIAGNOSIS — Z800 Family history of malignant neoplasm of digestive organs: Secondary | ICD-10-CM

## 2016-03-26 MED ORDER — NA SULFATE-K SULFATE-MG SULF 17.5-3.13-1.6 GM/177ML PO SOLN: ORAL | Status: DC

## 2016-03-26 NOTE — Telephone Encounter (Signed)
Dr Roda Shutters pt is scheduled for a colonoscopy with you on 04/09/16.She is being followed for breast cancer by Dr Burr Medico.She had a mastectomy in 06/2014.She receives Herceptin infusion monthly.Her last infusion was March 2017.Do you need to see her in the office or reschedule her colonoscopy for later date. Please advise,Thanks

## 2016-03-26 NOTE — Telephone Encounter (Signed)
No need for office visit.  OK to go ahead with 'direct' colonsocopy

## 2016-03-26 NOTE — Progress Notes (Signed)
Per pt, no allergies to soy or egg products.Pt not taking any weight loss meds or using  O2 at home. 

## 2016-03-26 NOTE — Telephone Encounter (Signed)
Spoke with pt and informed her she did not need an office visit prior to her colonoscopy per Dr Ardis Hughs.She understood.

## 2016-03-29 ENCOUNTER — Other Ambulatory Visit (HOSPITAL_BASED_OUTPATIENT_CLINIC_OR_DEPARTMENT_OTHER): Payer: BLUE CROSS/BLUE SHIELD

## 2016-03-29 ENCOUNTER — Ambulatory Visit (HOSPITAL_BASED_OUTPATIENT_CLINIC_OR_DEPARTMENT_OTHER): Payer: BLUE CROSS/BLUE SHIELD

## 2016-03-29 ENCOUNTER — Ambulatory Visit (HOSPITAL_BASED_OUTPATIENT_CLINIC_OR_DEPARTMENT_OTHER): Payer: BLUE CROSS/BLUE SHIELD | Admitting: Hematology

## 2016-03-29 ENCOUNTER — Telehealth: Payer: Self-pay | Admitting: Hematology

## 2016-03-29 ENCOUNTER — Encounter: Payer: Self-pay | Admitting: Hematology

## 2016-03-29 VITALS — BP 144/84 | HR 97 | Temp 98.4°F | Resp 18 | Ht 64.0 in | Wt 197.0 lb

## 2016-03-29 DIAGNOSIS — G4700 Insomnia, unspecified: Secondary | ICD-10-CM | POA: Diagnosis not present

## 2016-03-29 DIAGNOSIS — C773 Secondary and unspecified malignant neoplasm of axilla and upper limb lymph nodes: Secondary | ICD-10-CM | POA: Diagnosis not present

## 2016-03-29 DIAGNOSIS — Z5112 Encounter for antineoplastic immunotherapy: Secondary | ICD-10-CM

## 2016-03-29 DIAGNOSIS — C50411 Malignant neoplasm of upper-outer quadrant of right female breast: Secondary | ICD-10-CM

## 2016-03-29 DIAGNOSIS — Z79811 Long term (current) use of aromatase inhibitors: Secondary | ICD-10-CM

## 2016-03-29 DIAGNOSIS — Z170 Estrogen receptor positive status [ER+]: Secondary | ICD-10-CM

## 2016-03-29 DIAGNOSIS — F329 Major depressive disorder, single episode, unspecified: Secondary | ICD-10-CM

## 2016-03-29 DIAGNOSIS — F419 Anxiety disorder, unspecified: Secondary | ICD-10-CM

## 2016-03-29 DIAGNOSIS — C50919 Malignant neoplasm of unspecified site of unspecified female breast: Secondary | ICD-10-CM

## 2016-03-29 DIAGNOSIS — E559 Vitamin D deficiency, unspecified: Secondary | ICD-10-CM

## 2016-03-29 DIAGNOSIS — I10 Essential (primary) hypertension: Secondary | ICD-10-CM

## 2016-03-29 LAB — COMPREHENSIVE METABOLIC PANEL
ALBUMIN: 3.7 g/dL (ref 3.5–5.0)
ALK PHOS: 126 U/L (ref 40–150)
ALT: 15 U/L (ref 0–55)
AST: 14 U/L (ref 5–34)
Anion Gap: 9 mEq/L (ref 3–11)
BILIRUBIN TOTAL: 0.42 mg/dL (ref 0.20–1.20)
BUN: 18 mg/dL (ref 7.0–26.0)
CO2: 29 meq/L (ref 22–29)
CREATININE: 0.9 mg/dL (ref 0.6–1.1)
Calcium: 9.9 mg/dL (ref 8.4–10.4)
Chloride: 107 mEq/L (ref 98–109)
EGFR: 71 mL/min/{1.73_m2} — AB (ref >90)
GLUCOSE: 134 mg/dL (ref 70–140)
Potassium: 4.5 mEq/L (ref 3.5–5.1)
SODIUM: 146 meq/L — AB (ref 136–145)
TOTAL PROTEIN: 7.3 g/dL (ref 6.4–8.3)

## 2016-03-29 LAB — CBC WITH DIFFERENTIAL/PLATELET
BASO%: 0.3 % (ref 0.0–2.0)
Basophils Absolute: 0 10*3/uL (ref 0.0–0.1)
EOS ABS: 0.2 10*3/uL (ref 0.0–0.5)
EOS%: 2.3 % (ref 0.0–7.0)
HCT: 41.3 % (ref 34.8–46.6)
HEMOGLOBIN: 13.6 g/dL (ref 11.6–15.9)
LYMPH%: 16.4 % (ref 14.0–49.7)
MCH: 29.5 pg (ref 25.1–34.0)
MCHC: 32.9 g/dL (ref 31.5–36.0)
MCV: 89.6 fL (ref 79.5–101.0)
MONO#: 0.4 10*3/uL (ref 0.1–0.9)
MONO%: 4.9 % (ref 0.0–14.0)
NEUT%: 76.1 % (ref 38.4–76.8)
NEUTROS ABS: 5.6 10*3/uL (ref 1.5–6.5)
Platelets: 233 10*3/uL (ref 145–400)
RBC: 4.61 10*6/uL (ref 3.70–5.45)
RDW: 13.3 % (ref 11.2–14.5)
WBC: 7.3 10*3/uL (ref 3.9–10.3)
lymph#: 1.2 10*3/uL (ref 0.9–3.3)

## 2016-03-29 MED ORDER — ACETAMINOPHEN 325 MG PO TABS
650.0000 mg | ORAL_TABLET | Freq: Once | ORAL | Status: AC
  Administered 2016-03-29: 650 mg via ORAL

## 2016-03-29 MED ORDER — DIPHENHYDRAMINE HCL 25 MG PO CAPS
25.0000 mg | ORAL_CAPSULE | Freq: Once | ORAL | Status: AC
  Administered 2016-03-29: 25 mg via ORAL

## 2016-03-29 MED ORDER — ACETAMINOPHEN 325 MG PO TABS
ORAL_TABLET | ORAL | Status: AC
  Filled 2016-03-29: qty 2

## 2016-03-29 MED ORDER — SODIUM CHLORIDE 0.9 % IJ SOLN
10.0000 mL | INTRAMUSCULAR | Status: DC | PRN
  Administered 2016-03-29: 10 mL
  Filled 2016-03-29: qty 10

## 2016-03-29 MED ORDER — TRASTUZUMAB CHEMO INJECTION 440 MG
6.0000 mg/kg | Freq: Once | INTRAVENOUS | Status: AC
  Administered 2016-03-29: 525 mg via INTRAVENOUS
  Filled 2016-03-29: qty 25

## 2016-03-29 MED ORDER — DIPHENHYDRAMINE HCL 25 MG PO CAPS
ORAL_CAPSULE | ORAL | Status: AC
  Filled 2016-03-29: qty 1

## 2016-03-29 MED ORDER — SODIUM CHLORIDE 0.9 % IV SOLN
Freq: Once | INTRAVENOUS | Status: AC
  Administered 2016-03-29: 09:00:00 via INTRAVENOUS

## 2016-03-29 MED ORDER — ZOLPIDEM TARTRATE ER 12.5 MG PO TBCR: 12.5000 mg | EXTENDED_RELEASE_TABLET | Freq: Every evening | ORAL | Status: DC | PRN

## 2016-03-29 MED ORDER — HEPARIN SOD (PORK) LOCK FLUSH 100 UNIT/ML IV SOLN
500.0000 [IU] | Freq: Once | INTRAVENOUS | Status: AC | PRN
  Administered 2016-03-29: 500 [IU]
  Filled 2016-03-29: qty 5

## 2016-03-29 NOTE — Telephone Encounter (Signed)
Gave and printed appt sched and avs for pt for MAY and [place]

## 2016-03-29 NOTE — Progress Notes (Signed)
[facility]  [phone] [phone]  Clinic Follow up Note   Patient Care Team: [name], MD as PCP - General (Nurse Practitioner) 03/29/2016  SUMMARY OF ONCOLOGIC HISTORY:  Oncology History   Breast cancer of upper-outer quadrant of right female breast   Staging form: Breast, AJCC 7th Edition     Clinical: Stage IV (T2, N1, M1) - Signed by Truitt Merle, MD on 12/29/2014       Prognostic indicators: ER/PR/HER2NEU +      Pathologic: No stage assigned - Unsigned       Prognostic indicators: ER/PR/HER2NEU +        Breast cancer of upper-outer quadrant of right female breast ([place])   01/03/2014 Initial Diagnosis Breast cancer of upper-outer quadrant of right female breast, IDA, grade 2, ER 90%+, PR 1%+, her2 (+)   01/20/2014 PET scan Hypermetabolic mass in the posterior right breast (primary), 2 additional nodules in deep right breast (likely nodes) and 3 hyermetabolic portal nodes (lasrgest 2cm, SUV 13).    02/03/2014 - 05/20/2014 Neo-Adjuvant Chemotherapy docetaxel, Taxotere, Carboplatin, Herceptin, Perjeta for 6 cycles    06/01/2014 PET scan Minimal residual hypermetabolic activity within the residual mass inferolaterally in the right breast, likely surgical change. No  other hypermetabolic node or lesion.    06/10/2014 -  Anti-estrogen oral therapy Anastrozole 1 mg once daily   06/10/2014 -  Chemotherapy Maintenance Herceptin every 3 weeks   06/22/2014 Surgery right mastectomy with negative margines and axillary node dissection. [id number], complete patholigical resposne.    05/10/2015 Imaging No convincing evidence of hypermetabolic metastatic disease.Right level 2 node is decreased in size and hypermetabolism. Favored to be reactive.    CURRENT THERAPY:  1.Trastuzumab 6 mg/kg every 3 weeks indefinitely, changed to every 4 weeks from Nov 2016 2. Anastrozole 1 mg once daily  INTERVAL HISTORY: Madeline Davidson returns for follow-up and Herceptin treatment. She is  doing well overall. She has mild muscle aches and stiffness in the morning, especially in the legs and hand, mild left hip discomfort from her arthritis, she occasionally take Tylenol or Motrin, is tolerable. No other new pain or other complaints. She was not able to do colonoscopy due to the prep and the long driving distance from [place] to [place]. She would like to have that done in [place] in 2-3 months.  REVIEW OF SYSTEMS:   Constitutional: Denies fevers, chills or abnormal weight loss Eyes: Denies blurriness of vision Ears, nose, mouth, throat, and face: Denies mucositis or sore throat Respiratory: Denies cough, dyspnea or wheezes Cardiovascular: Denies palpitation, chest discomfort or lower extremity swelling Gastrointestinal:  Denies nausea, heartburn or change in bowel habits Skin: Denies abnormal skin rashes Lymphatics: Denies new lymphadenopathy or easy bruising Neurological:Denies numbness, tingling or new weaknesses Behavioral/Psych: Mood is stable, no new changes  All other systems were reviewed with the patient and are negative.  MEDICAL HISTORY:  Past Medical History  Diagnosis Date  . Allergy   . Wears glasses   . Breast cancer ([place]) 12/16/13     Invasive ductal Carcinoma; 1/1 nodes positive / self palpated  . PONV (postoperative nausea and vomiting)     Pt denies  . Anxiety   . History of blood transfusion ~ 04/2014    "related to chemo"  . Anemia ~ 04/2014  . Arthritis     back  . Septic shock ([place])     from chemo in 2015  . Inguinal hernia     Bil  . Diverticulitis  history of    SURGICAL HISTORY: Past Surgical History  Procedure Laterality Date  . Colon resection  2004    "for abscess on my colon"  . Hernia repair  2005    umb  . Cholecystectomy  2005  . Portacath placement Left 01/31/2014    Procedure: INSERTION PORT-A-CATH;  Surgeon: Rolm Bookbinder, MD;  Location: [facility];  Service: General;  Laterality: Left;  . Axillary lymph node biopsy  Right 12/2013  . Breast biopsy Right 12/2013  . Mastectomy w/ sentinel node biopsy Right 06/22/2014    Procedure: RIGHT TOTAL MASTECTOMY WITH SENTINEL LYMPH NODE BIOPSY;  Surgeon: Rolm Bookbinder, MD;  Location: [facility];  Service: General;  Laterality: Right;    I have reviewed the social history and family history with the patient and they are unchanged from previous note.  ALLERGIES:  is allergic to ciprofloxacin.  MEDICATIONS:  Current Outpatient Prescriptions  Medication Sig Dispense Refill  . acetaminophen (TYLENOL) 500 MG tablet Take 1,000 mg by mouth every 6 (six) hours as needed for mild pain.    Marland Kitchen anastrozole (ARIMIDEX) 1 MG tablet TAKE 1 TABLET DAILY 90 tablet 3  . cetirizine (ZYRTEC) 10 MG tablet Take 10 mg by mouth daily as needed for allergies.    . clonazePAM (KLONOPIN) 0.5 MG tablet Take 1 tablet (0.5 mg total) by mouth 2 (two) times daily as needed for anxiety. 30 tablet 0  . ergocalciferol (DRISDOL) 50000 UNITS capsule Take 1 capsule (50,000 Units total) by mouth once a week. 6 capsule 0  . ibuprofen (ADVIL,MOTRIN) 800 MG tablet Take 1 tablet (800 mg total) by mouth every 8 (eight) hours as needed for moderate pain. 90 tablet 2  . lidocaine-prilocaine (EMLA) cream APPLY EXTERNALLY TO AFFECTED AREA(S) AS NEEDED 30 g 1  . lisinopril (ZESTRIL) 10 MG tablet Take 1 tablet (10 mg total) by mouth at bedtime. 90 tablet 3  . Na Sulfate-K Sulfate-Mg Sulf (SUPREP BOWEL PREP) SOLN Suprep as directed / no substitutions 354 mL 0  . zolpidem (AMBIEN CR) 12.5 MG CR tablet Take 1 tablet (12.5 mg total) by mouth at bedtime as needed for sleep. 30 tablet 3   No current facility-administered medications for this visit.   Facility-Administered Medications Ordered in Other Visits  Medication Dose Route Frequency Provider Last Rate Last Dose  . sodium chloride 0.9 % injection 10 mL  10 mL Intracatheter PRN Truitt Merle, MD   10 mL at 09/15/15 1129    PHYSICAL EXAMINATION: ECOG PERFORMANCE STATUS: 0  - Asymptomatic  Filed Vitals:   03/29/16 0808  BP: 144/84  Pulse: 97  Temp: 98.4 F (36.9 C)  Resp: 18   Filed Weights   03/29/16 0808  Weight: 197 lb (89.359 kg)    GENERAL:alert, no distress and comfortable SKIN: skin color, texture, turgor are normal, no rashes or significant lesions EYES: normal, Conjunctiva are pink and non-injected, sclera clear OROPHARYNX:no exudate, no erythema and lips, buccal mucosa, and tongue normal  NECK: supple, thyroid normal size, non-tender, without nodularity LYMPH:  no palpable lymphadenopathy in the cervical, axillary or inguinal LUNGS: clear to auscultation and percussion with normal breathing effort HEART: regular rate & rhythm and no murmurs and no lower extremity edema ABDOMEN:abdomen soft, non-tender and normal bowel sounds, anus/rectal exam and inspection did not show any mass in the anus or distal rectum, no blood on the tip of glove  Musculoskeletal:no cyanosis of digits and no clubbing  NEURO: alert & oriented x 3 with fluent speech,  no focal motor/sensory deficits Right breast surgically absent,  no palpable lesions on chest wall or axilla note. Surgical scar is well healed. Left breast exam no palpable mass, skin change or nipple discharge.  LABORATORY DATA:  I have reviewed the data as listed CBC Latest Ref Rng 03/29/2016 03/01/2016 02/02/2016  WBC 3.9 - 10.3 10e3/uL 7.3 7.4 9.1  Hemoglobin 11.6 - 15.9 g/dL 13.6 13.7 12.6  Hematocrit 34.8 - 46.6 % 41.3 41.4 38.2  Platelets 145 - 400 10e3/uL 233 244 220     CMP Latest Ref Rng 03/29/2016 03/01/2016 02/02/2016  Glucose 70 - 140 mg/dl 134 131 99  BUN 7.0 - 26.0 mg/dL 18.0 18.5 15.3  Creatinine 0.6 - 1.1 mg/dL 0.9 0.9 0.8  Sodium 136 - 145 mEq/L 146(H) 142 145  Potassium 3.5 - 5.1 mEq/L 4.5 4.5 3.8  CO2 22 - 29 mEq/L '29 26 25  '$ Calcium 8.4 - 10.4 mg/dL 9.9 9.6 9.1  Total Protein 6.4 - 8.3 g/dL 7.3 7.0 6.6  Total Bilirubin 0.20 - 1.20 mg/dL 0.42 0.44 0.32  Alkaline Phos 40 - 150 U/L  126 127 113  AST 5 - 34 U/L '14 15 14  '$ ALT 0 - 55 U/L '15 13 18      '$ RADIOGRAPHIC STUDIES: I have personally reviewed the radiological images as listed and agreed with the findings in the report.  PET/CT on 11/07/2015 IMPRESSION: 1. No evidence of hypermetabolic recurrent or metastatic disease. Areas of hypermetabolism within the right level 2 station of the neck and right chest wall have resolved. 2. Progressive posterior anal hypermetabolism again warrants physical exam correlation. 3. Other chronic findings, including multiple abdominal and pelvic wall hernias, as detailed above.  Echo 10/06/2015 IImpressions:  - Normal LV size with EF 60%. Strain and lateral s&' as above. Normal RV size and systolic function. No significant valvular abnormalities..  ASSESSMENT AND PLAN  60 year old postmenopausal woman, was found to have a 3.3cm right breast mass and a positive axillary lymph nodes, biopsy confirmed invasive ductal carcinoma, ER positive PR positive HER-2 positive.  Her initial PET scan also reviewed 3 hypermetabolic nodes in periportal, measuring up to 2 cm with SUV 14. The periportal node was not bopsied, but likely represented metastatic nodes. She had completed response to neoadjuvant chemotherapy and underwent right mastectomy.  1. Breast cancer of upper outer quadrant of right female breast, G2 IDC, with metastasis to to periportal nodes, ER+/PR+/HER2+ -Her case was reviewed in our tumor board previously. Her initial and a post treatment PET scan was reviewed again. Due to the size and metabolic activity of those periportal lymph nodes at diagnosis, she likely has metastatic breast cancer. And the consensus was to continue therapy indefinitely. -Although she had excellent compete response to chemotherapy, She is at VERY high risk for cancer recurrence due to the nature of metastatic disease. We recommend her to continue maintenance Herceptin and anastrozole  indefinitely. She is little reluctant due to the financial stress (she has $6K annual deductible). After a lengthy discussion today, she agrees to continue. -Due to her work schedule, we have changed her Herceptin to every 4 weeks. -her recent repeated echo showed normal EF, she follows up with his cardiologist Dr. Haroldine Laws  -We discussed the risk of osteoporocess when she is on anastrozole, she will continue calcium and vitamin D supplements. Her last bone density scan from 09/2015 was normal.  -I encouraged her to continue exercise and remain to be active. -We will follow-up PET scan every 6 months  if she is clinically doing well, next due in early June  -I reviewed her lab results with her today, post CBC and CMP are within normal limits  2. Hypermetabolic uptake at anus -She has persistent hypermetabolic uptake at the anus from the past 2 PET scans -She is clinically asymptomatic, no pain, hematochezia or constipation. -rectal exam was negative for mass or bleeding. -She has not had colonoscopy. I strongly encouraged her to consider a colonoscopy, she is agreeable now, she wants to get it done in [place] after the next PET scan  3. Anxiety and depression, insomnia  -Continue xanax as needed -She tried zoloft and lexapro but could not tolerated.  -She is still on Ambien for insomnia, I refilled for her today  4. Vit D deficiency and bone health  -Her vitamin D level was 10 on 03/10/2015 -She received Vit D 50,000u weekly x6 weeks then 1000u daily  -I strongly encouraged her to continue taking calcium -Her DEXA scan showed normal bone density in 09/2015  -I discussed that anastrozole will likely weak her bone, and reviewed the treatment option for osteoporosis. She voiced good understanding.   Plan -Continue Herceptin every 4 weeks, continue anastrozole -repeat diagnostic screening mammogram of left breast next months in [place] -I will see her back in 2 months with a PET scan on  the same day -We'll refer her to GI in [place] or surrounding area after next PET scan -I refilled her Lorrin Mais today   All questions were answered. The patient knows to call the clinic with any problems, questions or concerns. No barriers to learning was detected.  I spent 20 minutes counseling the patient face to face. The total time spent in the appointment was 25 minutes and more than 50% was on counseling and review of test results   Truitt Merle, MD 03/29/2016

## 2016-03-29 NOTE — Patient Instructions (Signed)
[facility] Discharge Instructions for Patients Receiving Chemotherapy  Today you received the following chemotherapy agents: Herceptin   To help prevent nausea and vomiting after your treatment, we encourage you to take your nausea medication as directed.    If you develop nausea and vomiting that is not controlled by your nausea medication, call the clinic.   BELOW ARE SYMPTOMS THAT SHOULD BE REPORTED IMMEDIATELY:  *FEVER GREATER THAN 100.5 F  *CHILLS WITH OR WITHOUT FEVER  NAUSEA AND VOMITING THAT IS NOT CONTROLLED WITH YOUR NAUSEA MEDICATION  *UNUSUAL SHORTNESS OF BREATH  *UNUSUAL BRUISING OR BLEEDING  TENDERNESS IN MOUTH AND THROAT WITH OR WITHOUT PRESENCE OF ULCERS  *URINARY PROBLEMS  *BOWEL PROBLEMS  UNUSUAL RASH Items with * indicate a potential emergency and should be followed up as soon as possible.  Feel free to call the clinic you have any questions or concerns. The clinic phone number is [phone].  Please show the CHEMO ALERT CARD at check-in to the Emergency Department and triage nurse.   

## 2016-04-09 ENCOUNTER — Encounter: Payer: BLUE CROSS/BLUE SHIELD | Admitting: Gastroenterology

## 2016-04-26 ENCOUNTER — Other Ambulatory Visit (HOSPITAL_BASED_OUTPATIENT_CLINIC_OR_DEPARTMENT_OTHER): Payer: BLUE CROSS/BLUE SHIELD

## 2016-04-26 ENCOUNTER — Ambulatory Visit (HOSPITAL_BASED_OUTPATIENT_CLINIC_OR_DEPARTMENT_OTHER): Payer: BLUE CROSS/BLUE SHIELD

## 2016-04-26 VITALS — BP 143/85 | HR 79 | Temp 98.9°F | Resp 18

## 2016-04-26 DIAGNOSIS — C50411 Malignant neoplasm of upper-outer quadrant of right female breast: Secondary | ICD-10-CM

## 2016-04-26 DIAGNOSIS — Z5112 Encounter for antineoplastic immunotherapy: Secondary | ICD-10-CM

## 2016-04-26 LAB — CBC WITH DIFFERENTIAL/PLATELET
BASO%: 0.7 % (ref 0.0–2.0)
BASOS ABS: 0 10*3/uL (ref 0.0–0.1)
EOS%: 2.4 % (ref 0.0–7.0)
Eosinophils Absolute: 0.2 10*3/uL (ref 0.0–0.5)
HCT: 39.6 % (ref 34.8–46.6)
HEMOGLOBIN: 13 g/dL (ref 11.6–15.9)
LYMPH%: 16.5 % (ref 14.0–49.7)
MCH: 28.9 pg (ref 25.1–34.0)
MCHC: 32.7 g/dL (ref 31.5–36.0)
MCV: 88.2 fL (ref 79.5–101.0)
MONO#: 0.4 10*3/uL (ref 0.1–0.9)
MONO%: 5.4 % (ref 0.0–14.0)
NEUT#: 5 10*3/uL (ref 1.5–6.5)
NEUT%: 75 % (ref 38.4–76.8)
Platelets: 230 10*3/uL (ref 145–400)
RBC: 4.49 10*6/uL (ref 3.70–5.45)
RDW: 14.1 % (ref 11.2–14.5)
WBC: 6.7 10*3/uL (ref 3.9–10.3)
lymph#: 1.1 10*3/uL (ref 0.9–3.3)

## 2016-04-26 LAB — COMPREHENSIVE METABOLIC PANEL
ALT: 18 U/L (ref 0–55)
AST: 17 U/L (ref 5–34)
Albumin: 3.7 g/dL (ref 3.5–5.0)
Alkaline Phosphatase: 105 U/L (ref 40–150)
Anion Gap: 9 mEq/L (ref 3–11)
BUN: 18.1 mg/dL (ref 7.0–26.0)
CHLORIDE: 109 meq/L (ref 98–109)
CO2: 27 mEq/L (ref 22–29)
Calcium: 9.6 mg/dL (ref 8.4–10.4)
Creatinine: 0.9 mg/dL (ref 0.6–1.1)
EGFR: 75 mL/min/{1.73_m2} — ABNORMAL LOW (ref >90)
GLUCOSE: 110 mg/dL (ref 70–140)
POTASSIUM: 4.6 meq/L (ref 3.5–5.1)
SODIUM: 145 meq/L (ref 136–145)
Total Bilirubin: 0.42 mg/dL (ref 0.20–1.20)
Total Protein: 6.9 g/dL (ref 6.4–8.3)

## 2016-04-26 MED ORDER — ACETAMINOPHEN 325 MG PO TABS
ORAL_TABLET | ORAL | Status: AC
  Filled 2016-04-26: qty 2

## 2016-04-26 MED ORDER — ACETAMINOPHEN 325 MG PO TABS
650.0000 mg | ORAL_TABLET | Freq: Once | ORAL | Status: AC
  Administered 2016-04-26: 650 mg via ORAL

## 2016-04-26 MED ORDER — SODIUM CHLORIDE 0.9 % IJ SOLN
10.0000 mL | INTRAMUSCULAR | Status: DC | PRN
  Administered 2016-04-26: 10 mL
  Filled 2016-04-26: qty 10

## 2016-04-26 MED ORDER — HEPARIN SOD (PORK) LOCK FLUSH 100 UNIT/ML IV SOLN
500.0000 [IU] | Freq: Once | INTRAVENOUS | Status: AC | PRN
  Administered 2016-04-26: 500 [IU]
  Filled 2016-04-26: qty 5

## 2016-04-26 MED ORDER — DIPHENHYDRAMINE HCL 25 MG PO CAPS
ORAL_CAPSULE | ORAL | Status: AC
  Filled 2016-04-26: qty 1

## 2016-04-26 MED ORDER — DIPHENHYDRAMINE HCL 25 MG PO CAPS
25.0000 mg | ORAL_CAPSULE | Freq: Once | ORAL | Status: AC
  Administered 2016-04-26: 25 mg via ORAL

## 2016-04-26 MED ORDER — TRASTUZUMAB CHEMO INJECTION 440 MG
6.0000 mg/kg | Freq: Once | INTRAVENOUS | Status: AC
  Administered 2016-04-26: 525 mg via INTRAVENOUS
  Filled 2016-04-26: qty 25

## 2016-04-26 MED ORDER — SODIUM CHLORIDE 0.9 % IV SOLN
Freq: Once | INTRAVENOUS | Status: AC
  Administered 2016-04-26: 09:00:00 via INTRAVENOUS

## 2016-04-26 NOTE — Patient Instructions (Signed)
[facility] Discharge Instructions for Patients Receiving Chemotherapy  Today you received the following chemotherapy agents: Herceptin   To help prevent nausea and vomiting after your treatment, we encourage you to take your nausea medication as directed.    If you develop nausea and vomiting that is not controlled by your nausea medication, call the clinic.   BELOW ARE SYMPTOMS THAT SHOULD BE REPORTED IMMEDIATELY:  *FEVER GREATER THAN 100.5 F  *CHILLS WITH OR WITHOUT FEVER  NAUSEA AND VOMITING THAT IS NOT CONTROLLED WITH YOUR NAUSEA MEDICATION  *UNUSUAL SHORTNESS OF BREATH  *UNUSUAL BRUISING OR BLEEDING  TENDERNESS IN MOUTH AND THROAT WITH OR WITHOUT PRESENCE OF ULCERS  *URINARY PROBLEMS  *BOWEL PROBLEMS  UNUSUAL RASH Items with * indicate a potential emergency and should be followed up as soon as possible.  Feel free to call the clinic you have any questions or concerns. The clinic phone number is [phone].  Please show the CHEMO ALERT CARD at check-in to the Emergency Department and triage nurse.   

## 2016-05-15 ENCOUNTER — Other Ambulatory Visit: Payer: Self-pay | Admitting: *Deleted

## 2016-05-15 DIAGNOSIS — C50411 Malignant neoplasm of upper-outer quadrant of right female breast: Secondary | ICD-10-CM

## 2016-05-19 IMAGING — CT CT ABD-PELV W/ CM
2 of 5 series · 16 of 46 positions shown, 18 images · IV contrast (OMNIPAQUE)
Comparison: CTs 01/20/2014.  PET-CT performed today.

CLINICAL DATA: Chichi Barringer 57 y.o. female with stage IIB ER
positive, [id number]/Hurd positive infiltrating ductal carcinoma of the
right breast. Evaluate response to chemotherapy.

EXAM:
CT CHEST, ABDOMEN, AND PELVIS WITH CONTRAST
TECHNIQUE: Multidetector CT imaging of the chest, abdomen and pelvis was
performed following the standard protocol during bolus
administration of intravenous contrast.
CONTRAST:  100mL OMNIPAQUE IOHEXOL 300 MG/ML  SOLN

[Series 2: cap with st · axial · 0.86mm/px · z∈[-574,-44]mm · 13 of 122 slices shown, 15 images]
[im 8/122  soft-tissue]
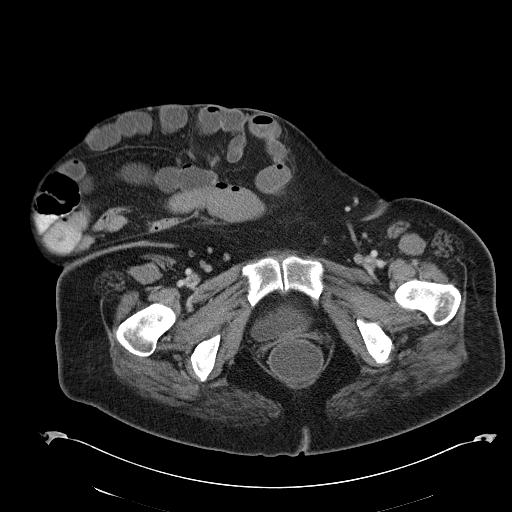
[im 8/122  bone]
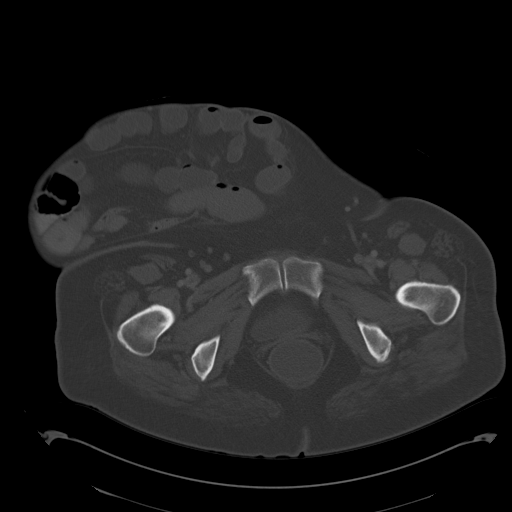
[im 15/122  soft-tissue]
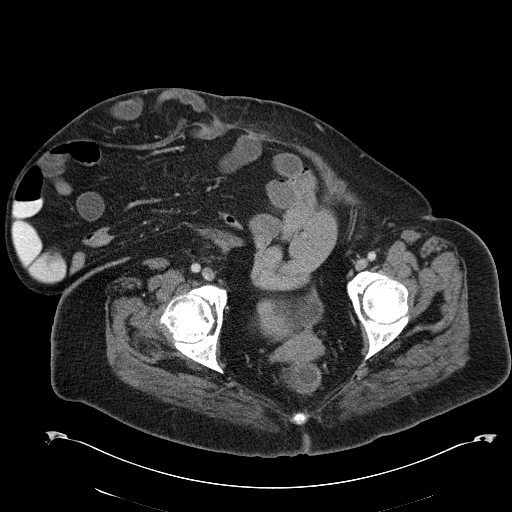
[im 29/122  soft-tissue]
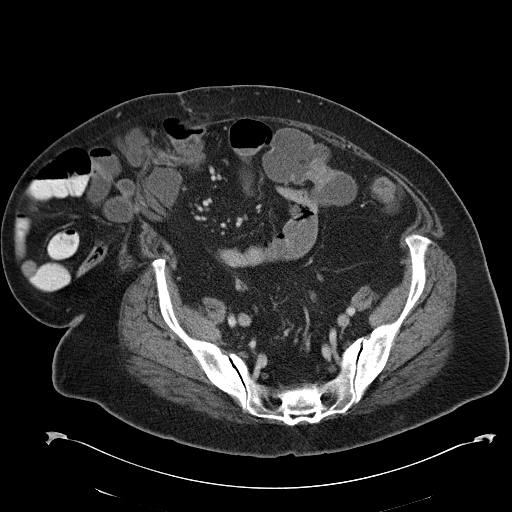
[im 36/122  soft-tissue]
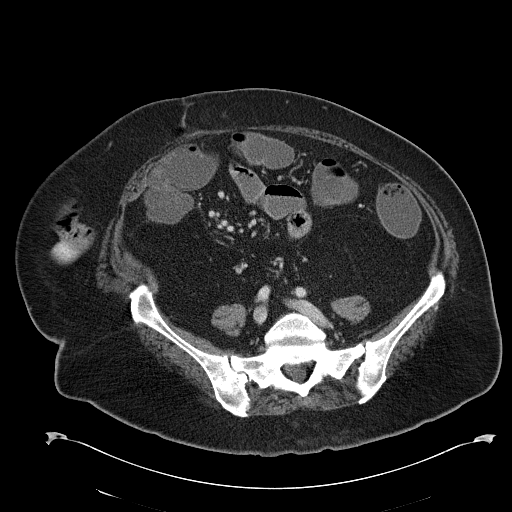
[im 43/122  soft-tissue]
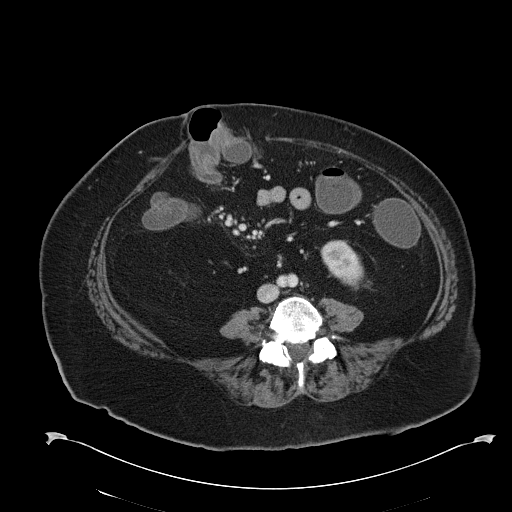
[im 50/122  soft-tissue]
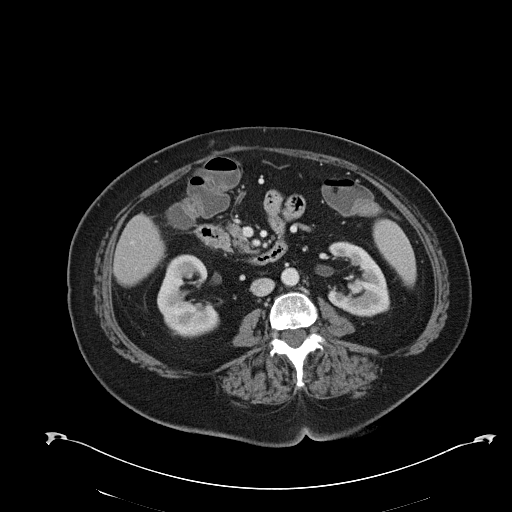
[im 65/122  soft-tissue]
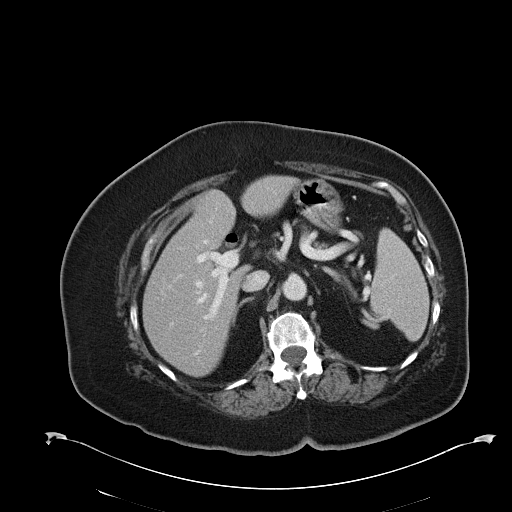
[im 72/122  soft-tissue]
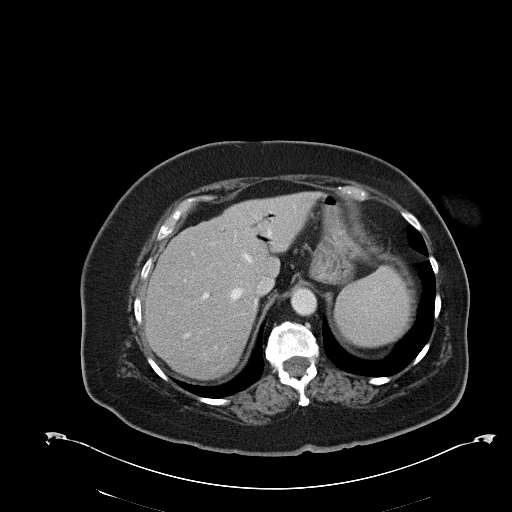
[im 79/122  soft-tissue]
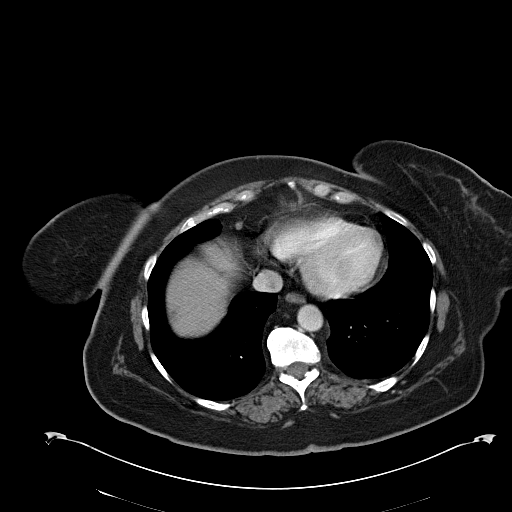
[im 79/122  bone]
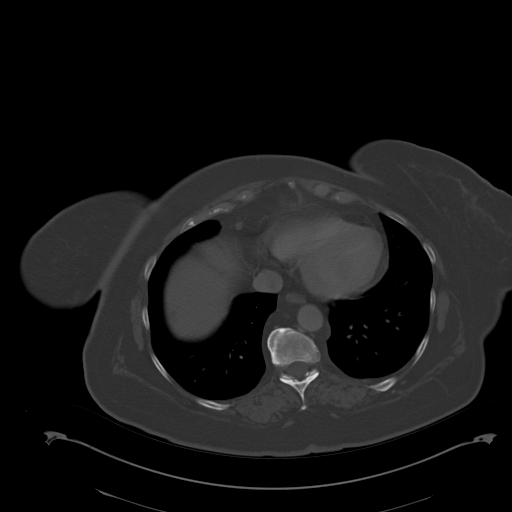
[im 86/122  soft-tissue]
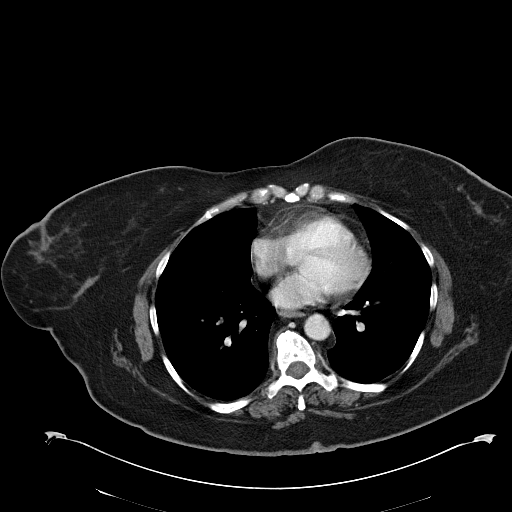
[im 93/122  soft-tissue]
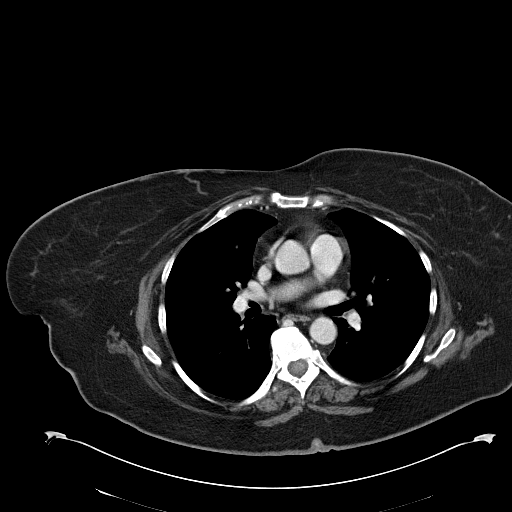
[im 107/122  soft-tissue]
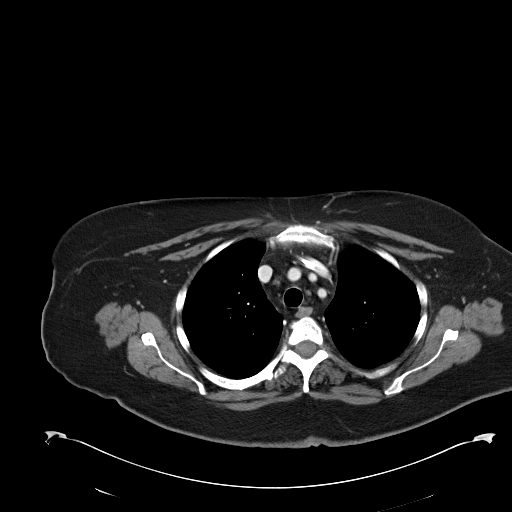
[im 114/122  soft-tissue]
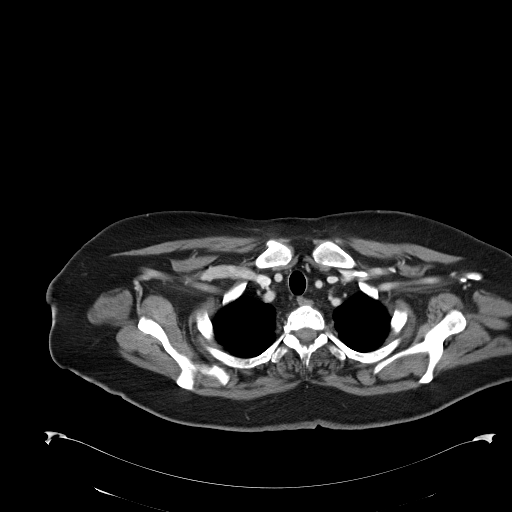

[Series 602: <mpr thick range> · coronal · 1.19mm/px · 3 of 108 slices shown]
[im 36/108  soft-tissue]
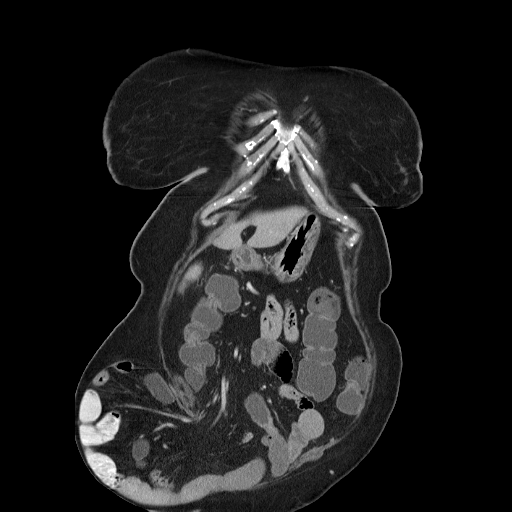
[im 48/108  soft-tissue]
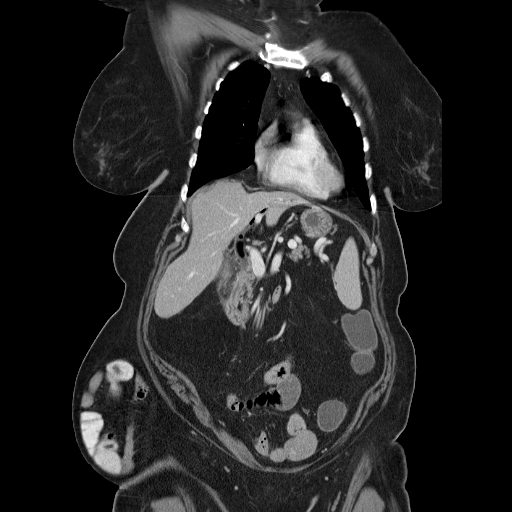
[im 60/108  soft-tissue]
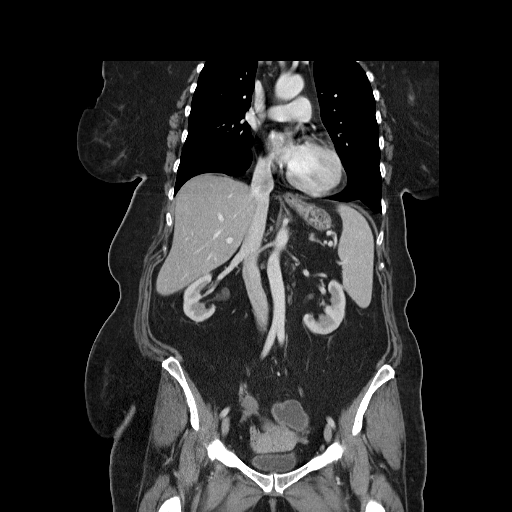

[16 of 46 positions shown; findings below may reference images not displayed]

FINDINGS: CT CHEST FINDINGS

Previously demonstrated mass inferolaterally in the right breast is
significantly smaller and flatter, now measuring 2.4 x 0.7 cm on
image 38. The probable enlarged lymph nodes previously noted in the
axillary portion of the right breast have also resolved. There are
currently no enlarged axillary, internal mammary, mediastinal or
hilar lymph nodes. 6 mm right pericardiac node on image 44 has
slightly decreased in size compared with the prior study.

Thyroid nodularity and mild atherosclerosis are stable. There is no
pleural or pericardial effusion. The heart size is normal.

The lungs are clear.  There are no worrisome osseous findings.

CT ABDOMEN AND PELVIS FINDINGS

There is stable pneumobilia status post cholecystectomy. Previously
demonstrated hepatic steatosis has improved. No focal lesions are
identified within the liver. The spleen, adrenal glands, pancreas
and kidneys appear normal.

There are no residual enlarged lymph nodes within the upper abdomen.
The largest remaining node is portacaval, measuring 6 mm short axis
on image 62. There is no retroperitoneal or pelvic adenopathy. There
is no ascites or peritoneal nodularity.

Multiple large ventral abdominal wall hernias are again noted, the
largest in the right lower quadrant containing significant portions
of the small bowel and cecum. There is no evidence of bowel
obstruction or extraluminal fluid collection. The uterus, ovaries
and bladder appear unchanged.

There are no worrisome osseous findings. Degenerative changes are
present throughout the spine. There is a unilateral pars defect on
the right at L5.
IMPRESSION: 1. Interval marked improvement in or resolution of right breast
mass, axillary and upper abdominal lymphadenopathy.
2. No evidence of metastatic disease or acute findings.
3. Stable large abdominal wall hernias without signs of bowel
obstruction or incarceration.

## 2016-05-24 ENCOUNTER — Ambulatory Visit (HOSPITAL_BASED_OUTPATIENT_CLINIC_OR_DEPARTMENT_OTHER): Payer: BLUE CROSS/BLUE SHIELD | Admitting: Hematology

## 2016-05-24 ENCOUNTER — Telehealth: Payer: Self-pay | Admitting: Hematology

## 2016-05-24 ENCOUNTER — Ambulatory Visit (HOSPITAL_BASED_OUTPATIENT_CLINIC_OR_DEPARTMENT_OTHER): Payer: BLUE CROSS/BLUE SHIELD

## 2016-05-24 ENCOUNTER — Encounter: Payer: Self-pay | Admitting: Hematology

## 2016-05-24 ENCOUNTER — Other Ambulatory Visit (HOSPITAL_BASED_OUTPATIENT_CLINIC_OR_DEPARTMENT_OTHER): Payer: BLUE CROSS/BLUE SHIELD

## 2016-05-24 VITALS — BP 171/72 | HR 77 | Temp 98.7°F | Resp 18 | Wt 198.6 lb

## 2016-05-24 DIAGNOSIS — F329 Major depressive disorder, single episode, unspecified: Secondary | ICD-10-CM

## 2016-05-24 DIAGNOSIS — Z5112 Encounter for antineoplastic immunotherapy: Secondary | ICD-10-CM | POA: Diagnosis not present

## 2016-05-24 DIAGNOSIS — F419 Anxiety disorder, unspecified: Secondary | ICD-10-CM | POA: Diagnosis not present

## 2016-05-24 DIAGNOSIS — L237 Allergic contact dermatitis due to plants, except food: Secondary | ICD-10-CM

## 2016-05-24 DIAGNOSIS — C50411 Malignant neoplasm of upper-outer quadrant of right female breast: Secondary | ICD-10-CM | POA: Diagnosis not present

## 2016-05-24 DIAGNOSIS — E559 Vitamin D deficiency, unspecified: Secondary | ICD-10-CM | POA: Diagnosis not present

## 2016-05-24 DIAGNOSIS — Z79811 Long term (current) use of aromatase inhibitors: Secondary | ICD-10-CM

## 2016-05-24 DIAGNOSIS — G4700 Insomnia, unspecified: Secondary | ICD-10-CM

## 2016-05-24 LAB — COMPREHENSIVE METABOLIC PANEL
ALBUMIN: 3.8 g/dL (ref 3.5–5.0)
ALK PHOS: 116 U/L (ref 40–150)
ALT: 16 U/L (ref 0–55)
AST: 16 U/L (ref 5–34)
Anion Gap: 10 mEq/L (ref 3–11)
BUN: 18.7 mg/dL (ref 7.0–26.0)
CALCIUM: 9.9 mg/dL (ref 8.4–10.4)
CO2: 26 mEq/L (ref 22–29)
Chloride: 107 mEq/L (ref 98–109)
Creatinine: 0.8 mg/dL (ref 0.6–1.1)
EGFR: 77 mL/min/{1.73_m2} — AB (ref >90)
GLUCOSE: 115 mg/dL (ref 70–140)
POTASSIUM: 4.3 meq/L (ref 3.5–5.1)
SODIUM: 143 meq/L (ref 136–145)
Total Bilirubin: 0.4 mg/dL (ref 0.20–1.20)
Total Protein: 7.1 g/dL (ref 6.4–8.3)

## 2016-05-24 LAB — CBC WITH DIFFERENTIAL/PLATELET
BASO%: 0.6 % (ref 0.0–2.0)
Basophils Absolute: 0.1 10*3/uL (ref 0.0–0.1)
EOS ABS: 0.2 10*3/uL (ref 0.0–0.5)
EOS%: 2 % (ref 0.0–7.0)
HCT: 39.5 % (ref 34.8–46.6)
HEMOGLOBIN: 13.1 g/dL (ref 11.6–15.9)
LYMPH%: 14.7 % (ref 14.0–49.7)
MCH: 28.8 pg (ref 25.1–34.0)
MCHC: 33 g/dL (ref 31.5–36.0)
MCV: 87.2 fL (ref 79.5–101.0)
MONO#: 0.5 10*3/uL (ref 0.1–0.9)
MONO%: 5.1 % (ref 0.0–14.0)
NEUT%: 77.6 % — ABNORMAL HIGH (ref 38.4–76.8)
NEUTROS ABS: 7.1 10*3/uL — AB (ref 1.5–6.5)
Platelets: 235 10*3/uL (ref 145–400)
RBC: 4.53 10*6/uL (ref 3.70–5.45)
RDW: 14.2 % (ref 11.2–14.5)
WBC: 9.2 10*3/uL (ref 3.9–10.3)
lymph#: 1.4 10*3/uL (ref 0.9–3.3)

## 2016-05-24 MED ORDER — SODIUM CHLORIDE 0.9 % IV SOLN
Freq: Once | INTRAVENOUS | Status: AC
  Administered 2016-05-24: 15:00:00 via INTRAVENOUS

## 2016-05-24 MED ORDER — SODIUM CHLORIDE 0.9 % IJ SOLN
10.0000 mL | INTRAMUSCULAR | Status: DC | PRN
  Administered 2016-05-24: 10 mL
  Filled 2016-05-24: qty 10

## 2016-05-24 MED ORDER — TRASTUZUMAB CHEMO INJECTION 440 MG
6.0000 mg/kg | Freq: Once | INTRAVENOUS | Status: AC
  Administered 2016-05-24: 525 mg via INTRAVENOUS
  Filled 2016-05-24: qty 25

## 2016-05-24 MED ORDER — DIPHENHYDRAMINE HCL 25 MG PO CAPS
ORAL_CAPSULE | ORAL | Status: AC
  Filled 2016-05-24: qty 1

## 2016-05-24 MED ORDER — DIPHENHYDRAMINE HCL 25 MG PO CAPS
25.0000 mg | ORAL_CAPSULE | Freq: Once | ORAL | Status: AC
  Administered 2016-05-24: 25 mg via ORAL

## 2016-05-24 MED ORDER — ACETAMINOPHEN 325 MG PO TABS
650.0000 mg | ORAL_TABLET | Freq: Once | ORAL | Status: AC
Start: 2016-05-24 — End: 2016-05-24
  Administered 2016-05-24: 650 mg via ORAL

## 2016-05-24 MED ORDER — ACETAMINOPHEN 325 MG PO TABS
ORAL_TABLET | ORAL | Status: AC
  Filled 2016-05-24: qty 2

## 2016-05-24 MED ORDER — CLOBETASOL PROPIONATE 0.05 % EX CREA: 1.0000 "application " | TOPICAL_CREAM | Freq: Two times a day (BID) | CUTANEOUS | Status: DC

## 2016-05-24 MED ORDER — HEPARIN SOD (PORK) LOCK FLUSH 100 UNIT/ML IV SOLN
500.0000 [IU] | Freq: Once | INTRAVENOUS | Status: AC | PRN
  Administered 2016-05-24: 500 [IU]
  Filled 2016-05-24: qty 5

## 2016-05-24 NOTE — Telephone Encounter (Signed)
Gave and printed appt sched and avs for pt for June and July  °

## 2016-05-24 NOTE — Progress Notes (Signed)
[place]  [phone] [phone]  Clinic Follow up Note   Patient Care Team: [name], MD as PCP - General (Nurse Practitioner) 05/24/2016  SUMMARY OF ONCOLOGIC HISTORY:  Oncology History   Breast cancer of upper-outer quadrant of right female breast   Staging form: Breast, AJCC 7th Edition     Clinical: Stage IV (T2, N1, M1) - Signed by Truitt Merle, MD on 12/29/2014       Prognostic indicators: ER/PR/HER2NEU +      Pathologic: No stage assigned - Unsigned       Prognostic indicators: ER/PR/HER2NEU +        Breast cancer of upper-outer quadrant of right female breast ([place])   01/03/2014 Initial Diagnosis Breast cancer of upper-outer quadrant of right female breast, IDA, grade 2, ER 90%+, PR 1%+, her2 (+)   01/20/2014 PET scan Hypermetabolic mass in the posterior right breast (primary), 2 additional nodules in deep right breast (likely nodes) and 3 hyermetabolic portal nodes (lasrgest 2cm, SUV 13).    02/03/2014 - 05/20/2014 Neo-Adjuvant Chemotherapy docetaxel, Taxotere, Carboplatin, Herceptin, Perjeta for 6 cycles    06/01/2014 PET scan Minimal residual hypermetabolic activity within the residual mass inferolaterally in the right breast, likely surgical change. No  other hypermetabolic node or lesion.    06/10/2014 -  Anti-estrogen oral therapy Anastrozole 1 mg once daily   06/10/2014 -  Chemotherapy Maintenance Herceptin every 3 weeks   06/22/2014 Surgery right mastectomy with negative margines and axillary node dissection. [id number], complete patholigical resposne.    05/10/2015 Imaging No convincing evidence of hypermetabolic metastatic disease.Right level 2 node is decreased in size and hypermetabolism. Favored to be reactive.    CURRENT THERAPY:  1.Trastuzumab 6 mg/kg every 3 weeks indefinitely, changed to every 4 weeks from Nov 2016 2. Anastrozole 1 mg once daily  INTERVAL HISTORY: Madeline Davidson returns for follow-up and Herceptin treatment. She is  doing well overall. She gets up  3:00am to go to work,  She feels exhausted after work.  She otherwise feels well, mild joint discomfort is stable. No other new complaint. She worked in the yard yesterday, and noticed multiple skin rash on her arms this morning, mild itchiness. No blister. No fever or chills.  REVIEW OF SYSTEMS:   Constitutional: Denies fevers, chills or abnormal weight loss Eyes: Denies blurriness of vision Ears, nose, mouth, throat, and face: Denies mucositis or sore throat Respiratory: Denies cough, dyspnea or wheezes Cardiovascular: Denies palpitation, chest discomfort or lower extremity swelling Gastrointestinal:  Denies nausea, heartburn or change in bowel habits Skin: Denies abnormal skin rashes Lymphatics: Denies new lymphadenopathy or easy bruising Neurological:Denies numbness, tingling or new weaknesses Behavioral/Psych: Mood is stable, no new changes  All other systems were reviewed with the patient and are negative.  MEDICAL HISTORY:  Past Medical History  Diagnosis Date  . Allergy   . Wears glasses   . Breast cancer ([place]) 12/16/13     Invasive ductal Carcinoma; 1/1 nodes positive / self palpated  . PONV (postoperative nausea and vomiting)     Pt denies  . Anxiety   . History of blood transfusion ~ 04/2014    "related to chemo"  . Anemia ~ 04/2014  . Arthritis     back  . Septic shock ([place])     from chemo in 2015  . Inguinal hernia     Bil  . Diverticulitis     history of    SURGICAL HISTORY: Past Surgical History  Procedure Laterality  Date  . Colon resection  2004    "for abscess on my colon"  . Hernia repair  2005    umb  . Cholecystectomy  2005  . Portacath placement Left 01/31/2014    Procedure: INSERTION PORT-A-CATH;  Surgeon: Matthew Wakefield, MD;  Location: [facility] OR;  Service: General;  Laterality: Left;  . Axillary lymph node biopsy Right 12/2013  . Breast biopsy Right 12/2013  . Mastectomy w/ sentinel node biopsy Right 06/22/2014     Procedure: RIGHT TOTAL MASTECTOMY WITH SENTINEL LYMPH NODE BIOPSY;  Surgeon: Matthew Wakefield, MD;  Location: [facility] OR;  Service: General;  Laterality: Right;    I have reviewed the social history and family history with the patient and they are unchanged from previous note.  ALLERGIES:  is allergic to ciprofloxacin.  MEDICATIONS:  Current Outpatient Prescriptions  Medication Sig Dispense Refill  . acetaminophen (TYLENOL) 500 MG tablet Take 1,000 mg by mouth every 6 (six) hours as needed for mild pain.    . anastrozole (ARIMIDEX) 1 MG tablet TAKE 1 TABLET DAILY 90 tablet 3  . cetirizine (ZYRTEC) 10 MG tablet Take 10 mg by mouth daily as needed for allergies.    . clonazePAM (KLONOPIN) 0.5 MG tablet Take 1 tablet (0.5 mg total) by mouth 2 (two) times daily as needed for anxiety. 30 tablet 0  . ergocalciferol (DRISDOL) 50000 UNITS capsule Take 1 capsule (50,000 Units total) by mouth once a week. 6 capsule 0  . ibuprofen (ADVIL,MOTRIN) 800 MG tablet Take 1 tablet (800 mg total) by mouth every 8 (eight) hours as needed for moderate pain. 90 tablet 2  . lidocaine-prilocaine (EMLA) cream APPLY EXTERNALLY TO AFFECTED AREA(S) AS NEEDED 30 g 1  . lisinopril (ZESTRIL) 10 MG tablet Take 1 tablet (10 mg total) by mouth at bedtime. 90 tablet 3  . zolpidem (AMBIEN CR) 12.5 MG CR tablet Take 1 tablet (12.5 mg total) by mouth at bedtime as needed for sleep. 30 tablet 3  . clobetasol cream (TEMOVATE) 0.05 % Apply 1 application topically 2 (two) times daily. 30 g 1   No current facility-administered medications for this visit.   Facility-Administered Medications Ordered in Other Visits  Medication Dose Route Frequency Provider Last Rate Last Dose  . sodium chloride 0.9 % injection 10 mL  10 mL Intracatheter PRN Yan Feng, MD   10 mL at 09/15/15 1129    PHYSICAL EXAMINATION: ECOG PERFORMANCE STATUS: 1  Filed Vitals:   05/24/16 1256  BP: 171/72  Pulse: 77  Temp: 98.7 F (37.1 C)  Resp: 18   Filed  Weights   05/24/16 1256  Weight: 198 lb 9.6 oz (90.084 kg)    GENERAL:alert, no distress and comfortable SKIN: skin color, texture, turgor are normal, no rashes or significant lesions(+) Diffuse small  Papular skin rash on her forearms, no blister, no skin erythema. OROPHARYNX:no exudate, no erythema and lips, buccal mucosa, and tongue normal  NECK: supple, thyroid normal size, non-tender, without nodularity LYMPH:  no palpable lymphadenopathy in the cervical, axillary or inguinal LUNGS: clear to auscultation and percussion with normal breathing effort HEART: regular rate & rhythm and no murmurs and no lower extremity edema ABDOMEN:abdomen soft, non-tender and normal bowel sounds, anus/rectal exam and inspection did not show any mass in the anus or distal rectum, no blood on the tip of glove  Musculoskeletal:no cyanosis of digits and no clubbing  NEURO: alert & oriented x 3 with fluent speech, no focal motor/sensory deficits Right breast surgically absent,    no palpable lesions on chest wall or axilla note. Surgical scar is well healed. Left breast exam no palpable mass, skin change or nipple discharge.  LABORATORY DATA:  I have reviewed the data as listed CBC Latest Ref Rng 05/24/2016 04/26/2016 03/29/2016  WBC 3.9 - 10.3 10e3/uL 9.2 6.7 7.3  Hemoglobin 11.6 - 15.9 g/dL 13.1 13.0 13.6  Hematocrit 34.8 - 46.6 % 39.5 39.6 41.3  Platelets 145 - 400 10e3/uL 235 230 233     CMP Latest Ref Rng 05/24/2016 04/26/2016 03/29/2016  Glucose 70 - 140 mg/dl 115 110 134  BUN 7.0 - 26.0 mg/dL 18.7 18.1 18.0  Creatinine 0.6 - 1.1 mg/dL 0.8 0.9 0.9  Sodium 136 - 145 mEq/L 143 145 146(H)  Potassium 3.5 - 5.1 mEq/L 4.3 4.6 4.5  CO2 22 - 29 mEq/L _0 Calcium 8.4 - 10.4 mg/dL 9.9 9.6 9.9  Total Protein 6.4 - 8.3 g/dL 7.1 6.9 7.3  Total Bilirubin 0.20 - 1.20 mg/dL 0.40 0.42 0.42  Alkaline Phos 40 - 150 U/L 116 105 126  AST 5 - 34 U/L _1 ALT 0 - 55 U/L _2 RADIOGRAPHIC STUDIES: I  have personally reviewed the radiological images as listed and agreed with the findings in the report.  PET/CT on 11/07/2015 IMPRESSION: 1. No evidence of hypermetabolic recurrent or metastatic disease. Areas of hypermetabolism within the right level 2 station of the neck and right chest wall have resolved. 2. Progressive posterior anal hypermetabolism again warrants physical exam correlation. 3. Other chronic findings, including multiple abdominal and pelvic wall hernias, as detailed above.  Echo 10/06/2015 IImpressions:  - Normal LV size with EF 60%. Strain and lateral s&' as above. Normal RV size and systolic function. No significant valvular abnormalities..  ASSESSMENT AND PLAN  60 year old postmenopausal woman, was found to have a 3.3cm right breast mass and a positive axillary lymph nodes, biopsy confirmed invasive ductal carcinoma, ER positive PR positive HER-2 positive.  Her initial PET scan also reviewed 3 hypermetabolic nodes in periportal, measuring up to 2 cm with SUV 14. The periportal node was not bopsied, but likely represented metastatic nodes. She had completed response to neoadjuvant chemotherapy and underwent right mastectomy.  1. Breast cancer of upper outer quadrant of right female breast, G2 IDC, with metastasis to to periportal nodes, ER+/PR+/HER2+ -Her case was reviewed in our tumor board previously. Her initial and a post treatment PET scan was reviewed again. Due to the size and metabolic activity of those periportal lymph nodes at diagnosis, she likely has metastatic breast cancer. And the consensus was to continue therapy indefinitely. -Although she had excellent compete response to chemotherapy, She is at VERY high risk for cancer recurrence due to the nature of metastatic disease. We recommend her to continue maintenance Herceptin and anastrozole indefinitely. She is little reluctant due to the financial stress (she has $6K annual deductible). After a  lengthy discussion today, she agrees to continue. -Due to her work schedule, we have changed her Herceptin to every 4 weeks. -her recent repeated echo showed normal EF, she follows up with his cardiologist Dr. Haroldine Laws  -We discussed the risk of osteoporocess when she is on anastrozole, she will continue calcium and vitamin D supplements. Her last bone density scan from 09/2015 was normal.  - her recent left breat mammogram was normal - his clinically doing well,  No clinical concern for disease progression.I encouraged her to continue exercise and remain to  be active. - his restaging PET scan is scheduled for next week. -I reviewed her lab results with her today, both CBC and CMP are within normal limits,  We'll continue Herceptin.  2. Hypermetabolic uptake at anus -She has persistent hypermetabolic uptake at the anus from the past 2 PET scans -She is clinically asymptomatic, no pain, hematochezia or constipation. -rectal exam was negative for mass or bleeding. -She has not had colonoscopy. I strongly encouraged her to consider a colonoscopy, she is agreeable now, she wants to get it done in [place] after the next PET scan  3. Anxiety and depression, insomnia  -Continue xanax as needed -She tried zoloft and lexapro but could not tolerated.  -She is still on Ambien for insomnia, I refilled for her today  4. Vit D deficiency and bone health  -Her vitamin D level was 10 on 03/10/2015 -She received Vit D 50,000u weekly x6 weeks then 1000u daily  -I strongly encouraged her to continue taking calcium -Her DEXA scan showed normal bone density in 09/2015  -I discussed that anastrozole will likely weak her bone, and reviewed the treatment option for osteoporosis. She voiced good understanding.  5. Poison ivy - the skin rash on her forearms are suspicious for poison ivy - activity up prescription of  Clobetasol cream, she will use twice a day.   Plan -Continue Herceptin every 4 weeks,  continue anastrozole -I will see her back in 2 months, or sooner if her PET scan is abnormal. If PET scan shows no disease progression, I will give her a call after the scan.  -We'll refer her to GI in [place] or surrounding area on next visit  -I called in  Clobetasol.   All questions were answered. The patient knows to call the clinic with any problems, questions or concerns. No barriers to learning was detected.  I spent 20 minutes counseling the patient face to face. The total time spent in the appointment was 25 minutes and more than 50% was on counseling and review of test results   Feng, Yan, MD 05/24/2016 

## 2016-05-24 NOTE — Patient Instructions (Signed)
[facility] Discharge Instructions for Patients Receiving Chemotherapy  Today you received the following chemotherapy agents Herceptin  To help prevent nausea and vomiting after your treatment, we encourage you to take your nausea medication    If you develop nausea and vomiting that is not controlled by your nausea medication, call the clinic.   BELOW ARE SYMPTOMS THAT SHOULD BE REPORTED IMMEDIATELY:  *FEVER GREATER THAN 100.5 F  *CHILLS WITH OR WITHOUT FEVER  NAUSEA AND VOMITING THAT IS NOT CONTROLLED WITH YOUR NAUSEA MEDICATION  *UNUSUAL SHORTNESS OF BREATH  *UNUSUAL BRUISING OR BLEEDING  TENDERNESS IN MOUTH AND THROAT WITH OR WITHOUT PRESENCE OF ULCERS  *URINARY PROBLEMS  *BOWEL PROBLEMS  UNUSUAL RASH Items with * indicate a potential emergency and should be followed up as soon as possible.  Feel free to call the clinic you have any questions or concerns. The clinic phone number is [phone].  Please show the CHEMO ALERT CARD at check-in to the Emergency Department and triage nurse.   

## 2016-06-03 ENCOUNTER — Ambulatory Visit (HOSPITAL_COMMUNITY)
Admission: RE | Admit: 2016-06-03 | Discharge: 2016-06-03 | Disposition: A | Discharge status: Home / Self Care | Payer: BLUE CROSS/BLUE SHIELD | Source: Ambulatory Visit | Attending: Hematology | Admitting: Hematology

## 2016-06-03 ENCOUNTER — Telehealth: Payer: Self-pay | Admitting: *Deleted

## 2016-06-03 DIAGNOSIS — K439 Ventral hernia without obstruction or gangrene: Secondary | ICD-10-CM | POA: Insufficient documentation

## 2016-06-03 DIAGNOSIS — Z9011 Acquired absence of right breast and nipple: Secondary | ICD-10-CM | POA: Insufficient documentation

## 2016-06-03 DIAGNOSIS — C50411 Malignant neoplasm of upper-outer quadrant of right female breast: Secondary | ICD-10-CM | POA: Diagnosis not present

## 2016-06-03 LAB — GLUCOSE, CAPILLARY: Glucose-Capillary: 96 mg/dL (ref 65–99)

## 2016-06-03 MED ORDER — FLUDEOXYGLUCOSE F - 18 (FDG) INJECTION
9.5200 | Freq: Once | INTRAVENOUS | Status: AC | PRN
  Administered 2016-06-03: 9.52 via INTRAVENOUS

## 2016-06-03 NOTE — Telephone Encounter (Signed)
Notified pt of PET results per Dr Ernestina Penna note & suggested she see GI for colonoscopy due to same uptake in the rectal area as before.  She expressed understanding & appreciation.

## 2016-06-03 NOTE — Telephone Encounter (Signed)
-----   Message from Truitt Merle, MD sent at 06/03/2016  9:09 AM EDT ----- Madeline Davidson,   Please call pt and let her know that PET scan looks good, no evidence of cancer. I want her to see GI for colonoscopy at her local facility, which we discussed before and she agreed to do so after the scan.  Thanks  Truitt Merle

## 2016-06-03 NOTE — Telephone Encounter (Signed)
Received vm call from pt stating that she would like Dr Burr Medico to call her with PET results @ [phone].  Message left for Dr Burr Medico.

## 2016-06-20 ENCOUNTER — Telehealth: Payer: Self-pay | Admitting: *Deleted

## 2016-06-20 NOTE — Telephone Encounter (Signed)
Patient called stating that she was bitten by a tick recently. PCP told patient that she possibly had lyme disease. Patient is still awaiting results and was given Amoxicillin 875 mg prescription. Patient would like to know will this interfere with her herceptin that she is scheduled to receive tomorrow. Patient can be contacted at [phone]. Message forwarded to RN/MD Burr Medico

## 2016-06-20 NOTE — Telephone Encounter (Signed)
Called pt & informed per Dr Burr Medico that herceptin should be ok for tomorrow.  Pt asked if she could skip treatment this month b/c she is so tired & the herceptin makes her very tired afterwards.  Discussed with Dr Burr Medico & OK to hold treatment this month. Will cancel 06/21/16 appt.

## 2016-06-21 ENCOUNTER — Other Ambulatory Visit: Payer: BLUE CROSS/BLUE SHIELD

## 2016-06-21 ENCOUNTER — Ambulatory Visit: Payer: BLUE CROSS/BLUE SHIELD

## 2016-07-15 ENCOUNTER — Other Ambulatory Visit: Payer: Self-pay | Admitting: Hematology

## 2016-07-15 DIAGNOSIS — C50411 Malignant neoplasm of upper-outer quadrant of right female breast: Secondary | ICD-10-CM

## 2016-07-18 ENCOUNTER — Ambulatory Visit (HOSPITAL_COMMUNITY): Payer: BLUE CROSS/BLUE SHIELD

## 2016-07-18 ENCOUNTER — Encounter (HOSPITAL_COMMUNITY): Payer: BLUE CROSS/BLUE SHIELD | Admitting: Internal Medicine

## 2016-07-19 ENCOUNTER — Ambulatory Visit (HOSPITAL_BASED_OUTPATIENT_CLINIC_OR_DEPARTMENT_OTHER): Payer: BLUE CROSS/BLUE SHIELD

## 2016-07-19 ENCOUNTER — Ambulatory Visit (HOSPITAL_BASED_OUTPATIENT_CLINIC_OR_DEPARTMENT_OTHER): Payer: BLUE CROSS/BLUE SHIELD | Admitting: Hematology

## 2016-07-19 ENCOUNTER — Telehealth: Payer: Self-pay | Admitting: Hematology

## 2016-07-19 ENCOUNTER — Encounter: Payer: Self-pay | Admitting: Hematology

## 2016-07-19 ENCOUNTER — Other Ambulatory Visit (HOSPITAL_BASED_OUTPATIENT_CLINIC_OR_DEPARTMENT_OTHER): Payer: BLUE CROSS/BLUE SHIELD

## 2016-07-19 VITALS — BP 144/79 | HR 89 | Temp 98.4°F | Resp 17 | Ht 64.0 in | Wt 198.5 lb

## 2016-07-19 DIAGNOSIS — E559 Vitamin D deficiency, unspecified: Secondary | ICD-10-CM

## 2016-07-19 DIAGNOSIS — F419 Anxiety disorder, unspecified: Secondary | ICD-10-CM | POA: Diagnosis not present

## 2016-07-19 DIAGNOSIS — C773 Secondary and unspecified malignant neoplasm of axilla and upper limb lymph nodes: Secondary | ICD-10-CM | POA: Diagnosis not present

## 2016-07-19 DIAGNOSIS — F329 Major depressive disorder, single episode, unspecified: Secondary | ICD-10-CM

## 2016-07-19 DIAGNOSIS — C50411 Malignant neoplasm of upper-outer quadrant of right female breast: Secondary | ICD-10-CM | POA: Diagnosis not present

## 2016-07-19 DIAGNOSIS — E669 Obesity, unspecified: Secondary | ICD-10-CM

## 2016-07-19 DIAGNOSIS — Z5112 Encounter for antineoplastic immunotherapy: Secondary | ICD-10-CM | POA: Diagnosis not present

## 2016-07-19 DIAGNOSIS — C50919 Malignant neoplasm of unspecified site of unspecified female breast: Secondary | ICD-10-CM

## 2016-07-19 DIAGNOSIS — G4700 Insomnia, unspecified: Secondary | ICD-10-CM

## 2016-07-19 DIAGNOSIS — I10 Essential (primary) hypertension: Secondary | ICD-10-CM

## 2016-07-19 LAB — CBC WITH DIFFERENTIAL/PLATELET
BASO%: 0.7 % (ref 0.0–2.0)
BASOS ABS: 0.1 10*3/uL (ref 0.0–0.1)
EOS%: 2.3 % (ref 0.0–7.0)
Eosinophils Absolute: 0.2 10*3/uL (ref 0.0–0.5)
HEMATOCRIT: 40.8 % (ref 34.8–46.6)
HGB: 13.6 g/dL (ref 11.6–15.9)
LYMPH#: 1.2 10*3/uL (ref 0.9–3.3)
LYMPH%: 14.8 % (ref 14.0–49.7)
MCH: 29.1 pg (ref 25.1–34.0)
MCHC: 33.3 g/dL (ref 31.5–36.0)
MCV: 87.4 fL (ref 79.5–101.0)
MONO#: 0.4 10*3/uL (ref 0.1–0.9)
MONO%: 5.7 % (ref 0.0–14.0)
NEUT#: 6 10*3/uL (ref 1.5–6.5)
NEUT%: 76.5 % (ref 38.4–76.8)
PLATELETS: 239 10*3/uL (ref 145–400)
RBC: 4.68 10*6/uL (ref 3.70–5.45)
RDW: 14.3 % (ref 11.2–14.5)
WBC: 7.9 10*3/uL (ref 3.9–10.3)

## 2016-07-19 LAB — COMPREHENSIVE METABOLIC PANEL
ALT: 19 U/L (ref 0–55)
ANION GAP: 12 meq/L — AB (ref 3–11)
AST: 17 U/L (ref 5–34)
Albumin: 3.6 g/dL (ref 3.5–5.0)
Alkaline Phosphatase: 118 U/L (ref 40–150)
BUN: 16.6 mg/dL (ref 7.0–26.0)
CALCIUM: 9.9 mg/dL (ref 8.4–10.4)
CHLORIDE: 108 meq/L (ref 98–109)
CO2: 25 mEq/L (ref 22–29)
CREATININE: 0.9 mg/dL (ref 0.6–1.1)
EGFR: 72 mL/min/{1.73_m2} — ABNORMAL LOW (ref >90)
Glucose: 113 mg/dl (ref 70–140)
POTASSIUM: 4.2 meq/L (ref 3.5–5.1)
Sodium: 144 mEq/L (ref 136–145)
Total Bilirubin: 0.3 mg/dL (ref 0.20–1.20)
Total Protein: 7.4 g/dL (ref 6.4–8.3)

## 2016-07-19 MED ORDER — SODIUM CHLORIDE 0.9 % IV SOLN
Freq: Once | INTRAVENOUS | Status: AC
  Administered 2016-07-19: 10:00:00 via INTRAVENOUS

## 2016-07-19 MED ORDER — ZOLPIDEM TARTRATE ER 12.5 MG PO TBCR: 12.5000 mg | EXTENDED_RELEASE_TABLET | Freq: Every evening | ORAL | 3 refills | Status: DC | PRN

## 2016-07-19 MED ORDER — DIPHENHYDRAMINE HCL 25 MG PO CAPS
ORAL_CAPSULE | ORAL | Status: AC
  Filled 2016-07-19: qty 1

## 2016-07-19 MED ORDER — SODIUM CHLORIDE 0.9 % IJ SOLN
10.0000 mL | INTRAMUSCULAR | Status: DC | PRN
  Administered 2016-07-19: 10 mL
  Filled 2016-07-19: qty 10

## 2016-07-19 MED ORDER — ACETAMINOPHEN 325 MG PO TABS
ORAL_TABLET | ORAL | Status: AC
  Filled 2016-07-19: qty 2

## 2016-07-19 MED ORDER — HEPARIN SOD (PORK) LOCK FLUSH 100 UNIT/ML IV SOLN
500.0000 [IU] | Freq: Once | INTRAVENOUS | Status: AC | PRN
  Administered 2016-07-19: 500 [IU]
  Filled 2016-07-19: qty 5

## 2016-07-19 MED ORDER — TRASTUZUMAB CHEMO 150 MG IV SOLR
6.0000 mg/kg | Freq: Once | INTRAVENOUS | Status: AC
  Administered 2016-07-19: 525 mg via INTRAVENOUS
  Filled 2016-07-19: qty 25

## 2016-07-19 MED ORDER — ACETAMINOPHEN 325 MG PO TABS
650.0000 mg | ORAL_TABLET | Freq: Once | ORAL | Status: AC
  Administered 2016-07-19: 650 mg via ORAL

## 2016-07-19 MED ORDER — DIPHENHYDRAMINE HCL 25 MG PO CAPS
25.0000 mg | ORAL_CAPSULE | Freq: Once | ORAL | Status: AC
  Administered 2016-07-19: 25 mg via ORAL

## 2016-07-19 NOTE — Telephone Encounter (Signed)
per pof to sch pt appt-gave pt copy of avs °

## 2016-07-19 NOTE — Progress Notes (Signed)
Madeline Davidson  [phone] [phone]  Clinic Follow up Note   Patient Care Team: [name], MD as PCP - General (Nurse Practitioner) 07/19/2016  SUMMARY OF ONCOLOGIC HISTORY:  Oncology History   Breast cancer of upper-outer quadrant of right female breast   Staging form: Breast, AJCC 7th Edition     Clinical: Stage IV (T2, N1, M1) - Signed by Truitt Merle, MD on 12/29/2014       Prognostic indicators: ER/PR/HER2NEU +      Pathologic: No stage assigned - Unsigned       Prognostic indicators: ER/PR/HER2NEU +        Breast cancer of upper-outer quadrant of right female breast (B and E)   01/03/2014 Initial Diagnosis    Breast cancer of upper-outer quadrant of right female breast, IDA, grade 2, ER 90%+, PR 1%+, her2 (+)     01/20/2014 PET scan    Hypermetabolic mass in the posterior right breast (primary), 2 additional nodules in deep right breast (likely nodes) and 3 hyermetabolic portal nodes (lasrgest 2cm, SUV 13).      02/03/2014 - 05/20/2014 Neo-Adjuvant Chemotherapy    docetaxel, Taxotere, Carboplatin, Herceptin, Perjeta for 6 cycles      06/01/2014 PET scan    Minimal residual hypermetabolic activity within the residual mass inferolaterally in the right breast, likely surgical change. No  other hypermetabolic node or lesion.      06/10/2014 -  Anti-estrogen oral therapy    Anastrozole 1 mg once daily     06/10/2014 -  Chemotherapy    Maintenance Herceptin every 3 weeks     06/22/2014 Surgery    right mastectomy with negative margines and axillary node dissection. [id number], complete patholigical resposne.      05/10/2015 Imaging    No convincing evidence of hypermetabolic metastatic disease.Right level 2 node is decreased in size and hypermetabolism. Favored to be reactive.      CURRENT THERAPY:  1.Trastuzumab 6 mg/kg every 3 weeks indefinitely, changed to every 4 weeks from Nov 2016 2. Anastrozole 1 mg once daily  INTERVAL HISTORY: Madeline Davidson  returns for follow-up and Herceptin treatment. She is doing well overall.  She had a tick bite on her right upper arm last month, was seen by her  primary care physician, test or  Lyme's  disease and [facility] fever was negative,  And she as treated with antiiotic.  She still has mild-to-moderate fatigue, able to tolerate her routine daily  activities and job,  No other compliant  REVIEW OF SYSTEMS:   Constitutional: Denies fevers, chills or abnormal weight loss Eyes: Denies blurriness of vision Ears, nose, mouth, throat, and face: Denies mucositis or sore throat Respiratory: Denies cough, dyspnea or wheezes Cardiovascular: Denies palpitation, chest discomfort or lower extremity swelling Gastrointestinal:  Denies nausea, heartburn or change in bowel habits Skin: Denies abnormal skin rashes Lymphatics: Denies new lymphadenopathy or easy bruising Neurological:Denies numbness, tingling or new weaknesses Behavioral/Psych: Mood is stable, no new changes  All other systems were reviewed with the patient and are negative.  MEDICAL HISTORY:  Past Medical History:  Diagnosis Date  . Allergy   . Anemia ~ 04/2014  . Anxiety   . Arthritis    back  . Breast cancer ([place]) 12/16/13    Invasive ductal Carcinoma; 1/1 nodes positive / self palpated  . Diverticulitis    history of  . History of blood transfusion ~ 04/2014   "related to chemo"  . Inguinal hernia    Bil  .  PONV (postoperative nausea and vomiting)    Pt denies  . Septic shock ([place])    from chemo in 2015  . Wears glasses     SURGICAL HISTORY: Past Surgical History:  Procedure Laterality Date  . AXILLARY LYMPH NODE BIOPSY Right 12/2013  . BREAST BIOPSY Right 12/2013  . CHOLECYSTECTOMY  2005  . COLON RESECTION  2004   "for abscess on my colon"  . HERNIA REPAIR  2005   umb  . MASTECTOMY W/ SENTINEL NODE BIOPSY Right 06/22/2014   Procedure: RIGHT TOTAL MASTECTOMY WITH SENTINEL LYMPH NODE BIOPSY;  Surgeon: Rolm Bookbinder, MD;   Location: [place];  Service: General;  Laterality: Right;  . PORTACATH PLACEMENT Left 01/31/2014   Procedure: INSERTION PORT-A-CATH;  Surgeon: Rolm Bookbinder, MD;  Location: [place];  Service: General;  Laterality: Left;    I have reviewed the social history and family history with the patient and they are unchanged from previous note.  ALLERGIES:  is allergic to ciprofloxacin.  MEDICATIONS:  Current Outpatient Prescriptions  Medication Sig Dispense Refill  . acetaminophen (TYLENOL) 500 MG tablet Take 1,000 mg by mouth every 6 (six) hours as needed for mild pain.    Marland Kitchen anastrozole (ARIMIDEX) 1 MG tablet Take 1 tablet (1 mg total) by mouth daily. 90 tablet 3  . cetirizine (ZYRTEC) 10 MG tablet Take 10 mg by mouth daily as needed for allergies.    . clonazePAM (KLONOPIN) 0.5 MG tablet Take 1 tablet (0.5 mg total) by mouth 2 (two) times daily as needed for anxiety. 30 tablet 0  . Cyanocobalamin (VITAMIN B 12 PO) Take 1,000 mcg by mouth daily.    . ergocalciferol (DRISDOL) 50000 UNITS capsule Take 1 capsule (50,000 Units total) by mouth once a week. 6 capsule 0  . ibuprofen (ADVIL,MOTRIN) 800 MG tablet Take 1 tablet (800 mg total) by mouth every 8 (eight) hours as needed for moderate pain. 90 tablet 2  . lidocaine-prilocaine (EMLA) cream APPLY EXTERNALLY TO AFFECTED AREA(S) AS NEEDED 30 g 1  . lisinopril (ZESTRIL) 10 MG tablet Take 1 tablet (10 mg total) by mouth at bedtime. 90 tablet 3  . zolpidem (AMBIEN CR) 12.5 MG CR tablet Take 1 tablet (12.5 mg total) by mouth at bedtime as needed for sleep. 30 tablet 3  . clobetasol cream (TEMOVATE) 9.56 % Apply 1 application topically 2 (two) times daily. (Patient not taking: Reported on 07/19/2016) 30 g 1   No current facility-administered medications for this visit.    Facility-Administered Medications Ordered in Other Visits  Medication Dose Route Frequency Provider Last Rate Last Dose  . sodium chloride 0.9 % injection 10 mL  10 mL Intracatheter PRN  Truitt Merle, MD   10 mL at 09/15/15 1129    PHYSICAL EXAMINATION: ECOG PERFORMANCE STATUS: 1  Vitals:   07/19/16 0832  BP: (!) 144/79  Pulse: 89  Resp: 17  Temp: 98.4 F (36.9 C)   Filed Weights   07/19/16 0832  Weight: 198 lb 8 oz (90 kg)    GENERAL:alert, no distress and comfortable SKIN: skin color, texture, turgor are normal, no rashes or significant lesions(+) Diffuse small  Papular skin rash on her forearms, no blister, no skin erythema. OROPHARYNX:no exudate, no erythema and lips, buccal mucosa, and tongue normal  NECK: supple, thyroid normal size, non-tender, without nodularity LYMPH:  no palpable lymphadenopathy in the cervical, axillary or inguinal LUNGS: clear to auscultation and percussion with normal breathing effort HEART: regular rate & rhythm and no murmurs  and no lower extremity edema ABDOMEN:abdomen soft, non-tender and normal bowel sounds, no organomegaly, (+) abdominal wall hernia in the right lower quadrant.  Musculoskeletal:no cyanosis of digits and no clubbing  NEURO: alert & oriented x 3 with fluent speech, no focal motor/sensory deficits Right breast surgically absent,  no palpable lesions on chest wall or axilla note. Surgical scar is well healed. Left breast exam no palpable mass, skin change or nipple discharge.  LABORATORY DATA:  I have reviewed the data as listed CBC Latest Ref Rng & Units 07/19/2016 05/24/2016 04/26/2016  WBC 3.9 - 10.3 10e3/uL 7.9 9.2 6.7  Hemoglobin 11.6 - 15.9 g/dL 13.6 13.1 13.0  Hematocrit 34.8 - 46.6 % 40.8 39.5 39.6  Platelets 145 - 400 10e3/uL 239 235 230     CMP Latest Ref Rng & Units 07/19/2016 05/24/2016 04/26/2016  Glucose 70 - 140 mg/dl 113 115 110  BUN 7.0 - 26.0 mg/dL 16.6 18.7 18.1  Creatinine 0.6 - 1.1 mg/dL 0.9 0.8 0.9  Sodium 136 - 145 mEq/L 144 143 145  Potassium 3.5 - 5.1 mEq/L 4.2 4.3 4.6  Chloride 96 - 112 mEq/L - - -  CO2 22 - 29 mEq/L '25 26 27  '$ Calcium 8.4 - 10.4 mg/dL 9.9 9.9 9.6  Total Protein 6.4 - 8.3  g/dL 7.4 7.1 6.9  Total Bilirubin 0.20 - 1.20 mg/dL 0.30 0.40 0.42  Alkaline Phos 40 - 150 U/L 118 116 105  AST 5 - 34 U/L '17 16 17  '$ ALT 0 - 55 U/L '19 16 18      '$ RADIOGRAPHIC STUDIES: I have personally reviewed the radiological images as listed and agreed with the findings in the report.  PET/CT on 06/03/2016 IMPRESSION: 1. No evidence of hypermetabolic recurrent or metastatic disease. 2. Anal hypermetabolism again identified and warrants physical exam correlation. 3. Incidental findings, including complex right abdominal wall hernia and laxity containing nonobstructive bowel.   Echo 03/18/2016 Study Conclusions  - Left ventricle: The cavity size was normal. Wall thickness was   normal. Systolic function was normal. The estimated ejection   fraction was in the range of 60% to 65%. Wall motion was normal;   there were no regional wall motion abnormalities. Doppler   parameters are consistent with abnormal left ventricular   relaxation (grade 1 diastolic dysfunction). - Impressions: GLS -18.2% (underestimated), Lateral s&' 10.9 cm/s.  Impressions:  - GLS -18.2% (underestimated), Lateral s&' 10.9 cm/s.   ASSESSMENT AND PLAN  60 year old postmenopausal woman, was found to have a 3.3cm right breast mass and a positive axillary lymph nodes, biopsy confirmed invasive ductal carcinoma, ER positive PR positive HER-2 positive.  Her initial PET scan also reviewed 3 hypermetabolic nodes in periportal, measuring up to 2 cm with SUV 14. The periportal node was not bopsied, but likely represented metastatic nodes. She had completed response to neoadjuvant chemotherapy and underwent right mastectomy.  1. Breast cancer of upper outer quadrant of right female breast, G2 IDC, with metastasis to to periportal nodes, ER+/PR+/HER2+ -Her case was reviewed in our tumor board previously. Her initial and a post treatment PET scan was reviewed again. Due to the size and metabolic activity of  those periportal lymph nodes at diagnosis, she likely has metastatic breast cancer. And the consensus was to continue therapy indefinitely. -Although she had excellent compete response to chemotherapy, She is at VERY high risk for cancer recurrence due to the nature of metastatic disease. We recommend her to continue maintenance Herceptin and anastrozole indefinitely. She is little  reluctant due to the financial stress (she has $6K annual deductible). After a lengthy discussion today, she agrees to continue. -Due to her work schedule, we have changed her Herceptin to every 4 weeks. -We discussed her restaging PET scan from June 2017, which showed no evidence of disease. -She has been on Herceptin for 2 and half years, if her next PET scan still remains negative, I think it's reasonable to stop Herceptin, and continue her anastrozole indefinitely. She is quite happy about the possibility that she can stop Herceptin. -her last echo showed normal EF in 02/2016 she follows up with his cardiologist Dr. Haroldine Laws  - her recent left breat mammogram was normal - his clinically doing well,  No clinical concern for disease progression.I encouraged her to continue exercise and remain to be active. -I reviewed her lab results with her today, both CBC and CMP are within normal limits,  We'll continue Herceptin.  2. Hypermetabolic uptake at anus -She has persistent hypermetabolic uptake at the anus from the past 2 PET scans -She is clinically asymptomatic, no pain, hematochezia or constipation. -rectal exam was negative for mass or bleeding. -She has not had colonoscopy. I strongly encouraged her to consider a colonoscopy, she is agreeable now, she wants to get it done in [place] some time this year   3. Anxiety and depression, insomnia  -Continue xanax as needed -She tried zoloft and lexapro but could not tolerated.  -She is still on Ambien for insomnia, I refilled for her today  4. Vit D deficiency and  bone health  -Her vitamin D level was 10 on 03/10/2015 -She received Vit D 50,000u weekly x6 weeks then 1000u daily  -I strongly encouraged her to continue taking calcium -Her DEXA scan showed normal bone density in 09/2015  -I discussed that anastrozole will likely weak her bone, and reviewed the treatment option for osteoporosis. She voiced good understanding.   Plan -Continue Herceptin every 4 weeks, continue anastrozole -I refilled her Ambien -I will see her back in 3 months, will order restaging PET on next visit to be done in late Dec    All questions were answered. The patient knows to call the clinic with any problems, questions or concerns. No barriers to learning was detected.  I spent 20 minutes counseling the patient face to face. The total time spent in the appointment was 25 minutes and more than 50% was on counseling and review of test results   Truitt Merle, MD 07/19/2016

## 2016-08-05 ENCOUNTER — Ambulatory Visit (HOSPITAL_BASED_OUTPATIENT_CLINIC_OR_DEPARTMENT_OTHER)
Admission: RE | Admit: 2016-08-05 | Discharge: 2016-08-05 | Disposition: A | Discharge status: Home / Self Care | Payer: BLUE CROSS/BLUE SHIELD | Source: Ambulatory Visit | Attending: Internal Medicine | Admitting: Internal Medicine

## 2016-08-05 ENCOUNTER — Ambulatory Visit (HOSPITAL_COMMUNITY)
Admission: RE | Admit: 2016-08-05 | Discharge: 2016-08-05 | Disposition: A | Discharge status: Home / Self Care | Payer: BLUE CROSS/BLUE SHIELD | Source: Ambulatory Visit | Attending: Nurse Practitioner | Admitting: Nurse Practitioner

## 2016-08-05 ENCOUNTER — Other Ambulatory Visit (HOSPITAL_COMMUNITY): Payer: Self-pay

## 2016-08-05 VITALS — BP 144/80 | HR 91 | Wt 199.0 lb

## 2016-08-05 DIAGNOSIS — E559 Vitamin D deficiency, unspecified: Secondary | ICD-10-CM | POA: Diagnosis not present

## 2016-08-05 DIAGNOSIS — I10 Essential (primary) hypertension: Secondary | ICD-10-CM | POA: Insufficient documentation

## 2016-08-05 DIAGNOSIS — Z09 Encounter for follow-up examination after completed treatment for conditions other than malignant neoplasm: Secondary | ICD-10-CM | POA: Diagnosis present

## 2016-08-05 DIAGNOSIS — F329 Major depressive disorder, single episode, unspecified: Secondary | ICD-10-CM | POA: Insufficient documentation

## 2016-08-05 DIAGNOSIS — C50411 Malignant neoplasm of upper-outer quadrant of right female breast: Secondary | ICD-10-CM

## 2016-08-05 DIAGNOSIS — G4700 Insomnia, unspecified: Secondary | ICD-10-CM | POA: Insufficient documentation

## 2016-08-05 DIAGNOSIS — Z79899 Other long term (current) drug therapy: Secondary | ICD-10-CM | POA: Insufficient documentation

## 2016-08-05 DIAGNOSIS — C50919 Malignant neoplasm of unspecified site of unspecified female breast: Secondary | ICD-10-CM | POA: Diagnosis not present

## 2016-08-05 DIAGNOSIS — I77810 Thoracic aortic ectasia: Secondary | ICD-10-CM

## 2016-08-05 DIAGNOSIS — C50511 Malignant neoplasm of lower-outer quadrant of right female breast: Secondary | ICD-10-CM | POA: Insufficient documentation

## 2016-08-05 DIAGNOSIS — E669 Obesity, unspecified: Secondary | ICD-10-CM | POA: Diagnosis not present

## 2016-08-05 DIAGNOSIS — F419 Anxiety disorder, unspecified: Secondary | ICD-10-CM | POA: Diagnosis not present

## 2016-08-05 NOTE — Patient Instructions (Signed)
Your physician has requested that you regularly monitor and record your blood pressure readings at home. Please use the same machine at the same time of day to check your readings and record them to bring to your follow-up visit.  We will contact you in 6 months to schedule your next appointment and echocardiogram 

## 2016-08-05 NOTE — Progress Notes (Signed)
  Echocardiogram 2D Echocardiogram has been performed.  Madeline Davidson 08/05/2016, 10:09 AM

## 2016-08-05 NOTE — Addendum Note (Signed)
Encounter addended by: Scarlette Calico, RN on: 08/05/2016 10:37 AM<BR>    Actions taken: Sign clinical note

## 2016-08-05 NOTE — Progress Notes (Signed)
Advanced Heart Failure Medication Review by a Pharmacist  Does the patient  feel that his/her medications are working for him/her?  yes  Has the patient been experiencing any side effects to the medications prescribed?  no  Does the patient measure his/her own blood pressure or blood glucose at home?  no   Does the patient have any problems obtaining medications due to transportation or finances?   no  Understanding of regimen: good Understanding of indications: good Potential of compliance: good Patient understands to avoid NSAIDs. Patient understands to avoid decongestants.  Issues to address at subsequent visits: None   Pharmacist comments:  Madeline Davidson is a pleasant 60 yo F presenting with her mother and a current medication list. She reports good compliance with her regimen and did not have any specific medication-related questions or concerns for me at this time.   Madeline Davidson. Madeline Davidson, PharmD, BCPS, CPP Clinical Pharmacist Pager: [phone] Phone: [phone] 08/05/2016 10:33 AM      Time with patient: 8 minutes Preparation and documentation time: 2 minutes Total time: 10 minutes

## 2016-08-05 NOTE — Progress Notes (Signed)
CARDIO-ONCOLOGY CLINIC NOTE Patient ID: Madeline Davidson, female   DOB: 06-28-1956, 60 y.o.   MRN: [id number] Oncologist: Dr Burr Medico PCP: Dr Norma Fredrickson  HPI:  Madeline Davidson is a 60 year old female with HTN, obesity and right breast cancer in the lower outer quadrant stage II (T2, N1). She was referred to the cardio-oncology clinic by Dr Humphrey Rolls. She is now followed by Dr. Burr Medico.   She started chemo 02/03/14. Has finished chemo.  S/p mastectomy in July 2015. Getting Herceptin indefinitely for metastatic disease, interval has been extended out to 4 months  Tolerating Herceptin very well. PET scan in 6/17 with no evidence of metastatic disease.  No dyspnea or chest pain.   No edema.    ECHO 01/24/14 EF 60-65% Lateral S' 9.8 ECHO 6/15 EF 60-65%, lateral S' 12.8, strain not done Echo 10/14/14  EF 60-65% lateral s' 11.1 GLS -23.8% Echo 2/16 EF 55-60%, poor strain tracking Echo 5/16 EF 60%, lateral s' 13.9 cm/sec, GLS -19% (poor images for strain) Echo 10/16 EF 60%, lateral s' 10.8, GLS -20.8%, normal RV size and systolic function Echo [id number] EF 60-65%, lateral s' 10.9, GLS -18.2%, normal RV size and systolic function. Grade I DD Echo 8/17 EF 60-65%, lateral s' 10.8, GLS -20.3%, normal RV size and systolic function. Grade II DD   FH: Uncle has HF. Mom has HTN   SH: Lives with her Mom. Works full time [place]: All systems reviewed and negative except as per HPI.   Past Medical History:  Diagnosis Date  . Allergy   . Anemia ~ 04/2014  . Anxiety   . Arthritis    back  . Breast cancer ([place]) 12/16/13    Invasive ductal Carcinoma; 1/1 nodes positive / self palpated  . Diverticulitis    history of  . History of blood transfusion ~ 04/2014   "related to chemo"  . Inguinal hernia    Bil  . PONV (postoperative nausea and vomiting)    Pt denies  . Septic shock ([place])    from chemo in 2015  . Wears glasses     Current Outpatient Prescriptions  Medication Sig Dispense Refill  .  acetaminophen (TYLENOL) 500 MG tablet Take 1,000 mg by mouth every 6 (six) hours as needed for mild pain.    Marland Kitchen anastrozole (ARIMIDEX) 1 MG tablet Take 1 tablet (1 mg total) by mouth daily. 90 tablet 3  . cetirizine (ZYRTEC) 10 MG tablet Take 10 mg by mouth daily as needed for allergies.    . clobetasol cream (TEMOVATE) [id number] % Apply 1 application topically 2 (two) times daily. (Patient not taking: Reported on 07/19/2016) 30 g 1  . clonazePAM (KLONOPIN) 0.5 MG tablet Take 1 tablet (0.5 mg total) by mouth 2 (two) times daily as needed for anxiety. 30 tablet 0  . Cyanocobalamin (VITAMIN B 12 PO) Take 1,000 mcg by mouth daily.    . ergocalciferol (DRISDOL) 50000 UNITS capsule Take 1 capsule (50,000 Units total) by mouth once a week. 6 capsule 0  . ibuprofen (ADVIL,MOTRIN) 800 MG tablet Take 1 tablet (800 mg total) by mouth every 8 (eight) hours as needed for moderate pain. 90 tablet 2  . lidocaine-prilocaine (EMLA) cream APPLY EXTERNALLY TO AFFECTED AREA(S) AS NEEDED 30 g 1  . lisinopril (ZESTRIL) 10 MG tablet Take 1 tablet (10 mg total) by mouth at bedtime. 90 tablet 3  . zolpidem (AMBIEN CR) 12.5 MG CR tablet Take 1 tablet (12.5 mg total) by mouth at bedtime as  needed for sleep. 30 tablet 3   No current facility-administered medications for this encounter.    Facility-Administered Medications Ordered in Other Encounters  Medication Dose Route Frequency Provider Last Rate Last Dose  . sodium chloride 0.9 % injection 10 mL  10 mL Intracatheter PRN Truitt Merle, MD   10 mL at 09/15/15 1129     Allergies  Allergen Reactions  . Ciprofloxacin Other (See Comments)    Patient had a headache after taking Cipro. Made her sinus infection symptoms worse.    Social History   Social History  . Marital status: Divorced    Spouse name: N/A  . Number of children: N/A  . Years of education: N/A   Occupational History  . Not on file.   Social History Main Topics  . Smoking status: Never Smoker  .  Smokeless tobacco: Never Used  . Alcohol use No  . Drug use: No  . Sexual activity: No   Other Topics Concern  . Not on file   Social History Narrative  . No narrative on file    Family History  Problem Relation Age of Onset  . Cancer Father 44    colon cancer  . Colon cancer Father   . Hypertension Mother     PHYSICAL EXAM: Vitals:   08/05/16 1013  BP: (!) 144/80  Pulse: 91   General:  Well appearing. No respiratory difficulty. Mom present HEENT: normal Neck: supple. no JVD. Carotids 2+ bilat; no bruits. No lymphadenopathy or thryomegaly appreciated. Cor: s/p R mastectomy. PMI nondisplaced. Regular rate & rhythm. No rubs, gallops or murmurs. Lungs: clear Abdomen: obese soft, nontender, nondistended. No hepatosplenomegaly. No bruits or masses. Good bowel sounds. Extremities: no cyanosis, clubbing, rash, edema Neuro: alert & oriented x 3, cranial nerves grossly intact. moves all 4 extremities w/o difficulty. Affect pleasant.  ASSESSMENT & PLAN: 1. Breast Cancer: Metastatic.  She will be getting Herceptin q4 wks indefinitely.  Echo today showed stable EF and strain.  Continue Herceptin, repeat echo in 6 months with followup. 2. HTN: Slightly elevated. Was unable to tolerate lisinopril 20 in past. Will take BP at home. Call me if persistently over [id number] systolic.  3. Aortic root dilation: Very mild. Will recheck on echo 6 months.   [name],MD 08/05/2016

## 2016-08-16 ENCOUNTER — Other Ambulatory Visit (HOSPITAL_BASED_OUTPATIENT_CLINIC_OR_DEPARTMENT_OTHER): Payer: BLUE CROSS/BLUE SHIELD

## 2016-08-16 ENCOUNTER — Ambulatory Visit (HOSPITAL_BASED_OUTPATIENT_CLINIC_OR_DEPARTMENT_OTHER): Payer: BLUE CROSS/BLUE SHIELD

## 2016-08-16 VITALS — BP 129/88 | HR 78 | Temp 99.2°F | Resp 16

## 2016-08-16 DIAGNOSIS — C50411 Malignant neoplasm of upper-outer quadrant of right female breast: Secondary | ICD-10-CM | POA: Diagnosis not present

## 2016-08-16 DIAGNOSIS — Z5112 Encounter for antineoplastic immunotherapy: Secondary | ICD-10-CM | POA: Diagnosis not present

## 2016-08-16 LAB — CBC WITH DIFFERENTIAL/PLATELET
BASO%: 0.8 % (ref 0.0–2.0)
BASOS ABS: 0.1 10*3/uL (ref 0.0–0.1)
EOS ABS: 0.2 10*3/uL (ref 0.0–0.5)
EOS%: 2.3 % (ref 0.0–7.0)
HCT: 42.2 % (ref 34.8–46.6)
HGB: 13.8 g/dL (ref 11.6–15.9)
LYMPH%: 16.6 % (ref 14.0–49.7)
MCH: 28.9 pg (ref 25.1–34.0)
MCHC: 32.6 g/dL (ref 31.5–36.0)
MCV: 88.5 fL (ref 79.5–101.0)
MONO#: 0.5 10*3/uL (ref 0.1–0.9)
MONO%: 6.5 % (ref 0.0–14.0)
NEUT#: 5.1 10*3/uL (ref 1.5–6.5)
NEUT%: 73.8 % (ref 38.4–76.8)
PLATELETS: 233 10*3/uL (ref 145–400)
RBC: 4.77 10*6/uL (ref 3.70–5.45)
RDW: 14.2 % (ref 11.2–14.5)
WBC: 6.9 10*3/uL (ref 3.9–10.3)
lymph#: 1.1 10*3/uL (ref 0.9–3.3)

## 2016-08-16 LAB — COMPREHENSIVE METABOLIC PANEL
ALT: 20 U/L (ref 0–55)
AST: 19 U/L (ref 5–34)
Albumin: 3.7 g/dL (ref 3.5–5.0)
Alkaline Phosphatase: 121 U/L (ref 40–150)
Anion Gap: 10 mEq/L (ref 3–11)
BILIRUBIN TOTAL: 0.5 mg/dL (ref 0.20–1.20)
BUN: 20 mg/dL (ref 7.0–26.0)
CALCIUM: 9.9 mg/dL (ref 8.4–10.4)
CO2: 27 meq/L (ref 22–29)
CREATININE: 0.8 mg/dL (ref 0.6–1.1)
Chloride: 108 mEq/L (ref 98–109)
EGFR: 76 mL/min/{1.73_m2} — ABNORMAL LOW (ref >90)
Glucose: 91 mg/dl (ref 70–140)
Potassium: 4 mEq/L (ref 3.5–5.1)
Sodium: 145 mEq/L (ref 136–145)
TOTAL PROTEIN: 7.4 g/dL (ref 6.4–8.3)

## 2016-08-16 MED ORDER — ACETAMINOPHEN 325 MG PO TABS
ORAL_TABLET | ORAL | Status: AC
  Filled 2016-08-16: qty 2

## 2016-08-16 MED ORDER — DIPHENHYDRAMINE HCL 25 MG PO CAPS
25.0000 mg | ORAL_CAPSULE | Freq: Once | ORAL | Status: AC
  Administered 2016-08-16: 25 mg via ORAL

## 2016-08-16 MED ORDER — SODIUM CHLORIDE 0.9 % IV SOLN: Freq: Once | INTRAVENOUS | Status: DC

## 2016-08-16 MED ORDER — ACETAMINOPHEN 325 MG PO TABS
650.0000 mg | ORAL_TABLET | Freq: Once | ORAL | Status: AC
  Administered 2016-08-16: 650 mg via ORAL

## 2016-08-16 MED ORDER — HEPARIN SOD (PORK) LOCK FLUSH 100 UNIT/ML IV SOLN
500.0000 [IU] | Freq: Once | INTRAVENOUS | Status: AC | PRN
  Administered 2016-08-16: 500 [IU]
  Filled 2016-08-16: qty 5

## 2016-08-16 MED ORDER — SODIUM CHLORIDE 0.9 % IJ SOLN
10.0000 mL | INTRAMUSCULAR | Status: DC | PRN
  Administered 2016-08-16: 10 mL
  Filled 2016-08-16: qty 10

## 2016-08-16 MED ORDER — DIPHENHYDRAMINE HCL 25 MG PO CAPS
ORAL_CAPSULE | ORAL | Status: AC
  Filled 2016-08-16: qty 1

## 2016-08-16 MED ORDER — TRASTUZUMAB CHEMO 150 MG IV SOLR
6.0000 mg/kg | Freq: Once | INTRAVENOUS | Status: AC
  Administered 2016-08-16: 525 mg via INTRAVENOUS
  Filled 2016-08-16: qty 25

## 2016-08-16 NOTE — Patient Instructions (Signed)
[facility] Discharge Instructions for Patients Receiving Chemotherapy  Today you received the following chemotherapy agents herceptin   To help prevent nausea and vomiting after your treatment, we encourage you to take your nausea medication as directed   If you develop nausea and vomiting that is not controlled by your nausea medication, call the clinic.   BELOW ARE SYMPTOMS THAT SHOULD BE REPORTED IMMEDIATELY:  *FEVER GREATER THAN 100.5 F  *CHILLS WITH OR WITHOUT FEVER  NAUSEA AND VOMITING THAT IS NOT CONTROLLED WITH YOUR NAUSEA MEDICATION  *UNUSUAL SHORTNESS OF BREATH  *UNUSUAL BRUISING OR BLEEDING  TENDERNESS IN MOUTH AND THROAT WITH OR WITHOUT PRESENCE OF ULCERS  *URINARY PROBLEMS  *BOWEL PROBLEMS  UNUSUAL RASH Items with * indicate a potential emergency and should be followed up as soon as possible.  Feel free to call the clinic you have any questions or concerns. The clinic phone number is [phone].  

## 2016-08-28 ENCOUNTER — Other Ambulatory Visit: Payer: Self-pay | Admitting: Hematology

## 2016-09-13 ENCOUNTER — Other Ambulatory Visit (HOSPITAL_BASED_OUTPATIENT_CLINIC_OR_DEPARTMENT_OTHER): Payer: BLUE CROSS/BLUE SHIELD

## 2016-09-13 ENCOUNTER — Ambulatory Visit (HOSPITAL_BASED_OUTPATIENT_CLINIC_OR_DEPARTMENT_OTHER): Payer: BLUE CROSS/BLUE SHIELD

## 2016-09-13 VITALS — BP 132/81 | HR 77 | Temp 98.9°F | Resp 18

## 2016-09-13 DIAGNOSIS — C50411 Malignant neoplasm of upper-outer quadrant of right female breast: Secondary | ICD-10-CM

## 2016-09-13 DIAGNOSIS — Z5112 Encounter for antineoplastic immunotherapy: Secondary | ICD-10-CM

## 2016-09-13 LAB — COMPREHENSIVE METABOLIC PANEL
ALBUMIN: 3.6 g/dL (ref 3.5–5.0)
ALK PHOS: 120 U/L (ref 40–150)
ALT: 18 U/L (ref 0–55)
AST: 16 U/L (ref 5–34)
Anion Gap: 11 mEq/L (ref 3–11)
BUN: 19.4 mg/dL (ref 7.0–26.0)
CHLORIDE: 108 meq/L (ref 98–109)
CO2: 26 meq/L (ref 22–29)
Calcium: 9.9 mg/dL (ref 8.4–10.4)
Creatinine: 0.8 mg/dL (ref 0.6–1.1)
EGFR: 79 mL/min/{1.73_m2} — AB (ref >90)
GLUCOSE: 115 mg/dL (ref 70–140)
POTASSIUM: 4 meq/L (ref 3.5–5.1)
SODIUM: 144 meq/L (ref 136–145)
Total Bilirubin: 0.43 mg/dL (ref 0.20–1.20)
Total Protein: 7.3 g/dL (ref 6.4–8.3)

## 2016-09-13 LAB — CBC WITH DIFFERENTIAL/PLATELET
BASO%: 0.6 % (ref 0.0–2.0)
Basophils Absolute: 0 10*3/uL (ref 0.0–0.1)
EOS%: 3.7 % (ref 0.0–7.0)
Eosinophils Absolute: 0.2 10*3/uL (ref 0.0–0.5)
HCT: 40.1 % (ref 34.8–46.6)
HGB: 13.1 g/dL (ref 11.6–15.9)
LYMPH%: 19.7 % (ref 14.0–49.7)
MCH: 29.1 pg (ref 25.1–34.0)
MCHC: 32.7 g/dL (ref 31.5–36.0)
MCV: 89.1 fL (ref 79.5–101.0)
MONO#: 0.4 10*3/uL (ref 0.1–0.9)
MONO%: 6.1 % (ref 0.0–14.0)
NEUT%: 69.9 % (ref 38.4–76.8)
NEUTROS ABS: 4.6 10*3/uL (ref 1.5–6.5)
PLATELETS: 226 10*3/uL (ref 145–400)
RBC: 4.5 10*6/uL (ref 3.70–5.45)
RDW: 13.5 % (ref 11.2–14.5)
WBC: 6.6 10*3/uL (ref 3.9–10.3)
lymph#: 1.3 10*3/uL (ref 0.9–3.3)

## 2016-09-13 MED ORDER — DIPHENHYDRAMINE HCL 25 MG PO CAPS
ORAL_CAPSULE | ORAL | Status: AC
  Filled 2016-09-13: qty 1

## 2016-09-13 MED ORDER — ACETAMINOPHEN 325 MG PO TABS
650.0000 mg | ORAL_TABLET | Freq: Once | ORAL | Status: AC
  Administered 2016-09-13: 650 mg via ORAL

## 2016-09-13 MED ORDER — DIPHENHYDRAMINE HCL 25 MG PO CAPS
25.0000 mg | ORAL_CAPSULE | Freq: Once | ORAL | Status: AC
  Administered 2016-09-13: 25 mg via ORAL

## 2016-09-13 MED ORDER — TRASTUZUMAB CHEMO 150 MG IV SOLR
6.0000 mg/kg | Freq: Once | INTRAVENOUS | Status: AC
  Administered 2016-09-13: 525 mg via INTRAVENOUS
  Filled 2016-09-13: qty 25

## 2016-09-13 MED ORDER — SODIUM CHLORIDE 0.9 % IJ SOLN
10.0000 mL | INTRAMUSCULAR | Status: DC | PRN
  Administered 2016-09-13: 10 mL
  Filled 2016-09-13: qty 10

## 2016-09-13 MED ORDER — HEPARIN SOD (PORK) LOCK FLUSH 100 UNIT/ML IV SOLN
500.0000 [IU] | Freq: Once | INTRAVENOUS | Status: AC | PRN
  Administered 2016-09-13: 500 [IU]
  Filled 2016-09-13: qty 5

## 2016-09-13 MED ORDER — SODIUM CHLORIDE 0.9 % IV SOLN
Freq: Once | INTRAVENOUS | Status: AC
  Administered 2016-09-13: 09:00:00 via INTRAVENOUS

## 2016-09-13 MED ORDER — ACETAMINOPHEN 325 MG PO TABS
ORAL_TABLET | ORAL | Status: AC
  Filled 2016-09-13: qty 2

## 2016-09-13 NOTE — Patient Instructions (Signed)
[facility] Discharge Instructions for Patients Receiving Chemotherapy  Today you received the following chemotherapy agents: Herceptin   To help prevent nausea and vomiting after your treatment, we encourage you to take your nausea medication as directed.    If you develop nausea and vomiting that is not controlled by your nausea medication, call the clinic.   BELOW ARE SYMPTOMS THAT SHOULD BE REPORTED IMMEDIATELY:  *FEVER GREATER THAN 100.5 F  *CHILLS WITH OR WITHOUT FEVER  NAUSEA AND VOMITING THAT IS NOT CONTROLLED WITH YOUR NAUSEA MEDICATION  *UNUSUAL SHORTNESS OF BREATH  *UNUSUAL BRUISING OR BLEEDING  TENDERNESS IN MOUTH AND THROAT WITH OR WITHOUT PRESENCE OF ULCERS  *URINARY PROBLEMS  *BOWEL PROBLEMS  UNUSUAL RASH Items with * indicate a potential emergency and should be followed up as soon as possible.  Feel free to call the clinic you have any questions or concerns. The clinic phone number is [phone].  Please show the CHEMO ALERT CARD at check-in to the Emergency Department and triage nurse.   

## 2016-10-09 ENCOUNTER — Other Ambulatory Visit: Payer: Self-pay | Admitting: Hematology

## 2016-10-10 ENCOUNTER — Other Ambulatory Visit (HOSPITAL_BASED_OUTPATIENT_CLINIC_OR_DEPARTMENT_OTHER): Payer: BLUE CROSS/BLUE SHIELD

## 2016-10-10 ENCOUNTER — Telehealth: Payer: Self-pay | Admitting: Hematology

## 2016-10-10 ENCOUNTER — Ambulatory Visit (HOSPITAL_BASED_OUTPATIENT_CLINIC_OR_DEPARTMENT_OTHER): Payer: BLUE CROSS/BLUE SHIELD | Admitting: Hematology

## 2016-10-10 ENCOUNTER — Encounter: Payer: Self-pay | Admitting: Hematology

## 2016-10-10 ENCOUNTER — Ambulatory Visit (HOSPITAL_BASED_OUTPATIENT_CLINIC_OR_DEPARTMENT_OTHER): Payer: BLUE CROSS/BLUE SHIELD

## 2016-10-10 VITALS — BP 169/83 | HR 79 | Temp 98.7°F | Resp 18 | Ht 64.0 in | Wt 198.0 lb

## 2016-10-10 DIAGNOSIS — E559 Vitamin D deficiency, unspecified: Secondary | ICD-10-CM | POA: Diagnosis not present

## 2016-10-10 DIAGNOSIS — Z23 Encounter for immunization: Secondary | ICD-10-CM | POA: Diagnosis not present

## 2016-10-10 DIAGNOSIS — F419 Anxiety disorder, unspecified: Secondary | ICD-10-CM

## 2016-10-10 DIAGNOSIS — G4700 Insomnia, unspecified: Secondary | ICD-10-CM | POA: Diagnosis not present

## 2016-10-10 DIAGNOSIS — Z79811 Long term (current) use of aromatase inhibitors: Secondary | ICD-10-CM

## 2016-10-10 DIAGNOSIS — C50411 Malignant neoplasm of upper-outer quadrant of right female breast: Secondary | ICD-10-CM

## 2016-10-10 DIAGNOSIS — Z5112 Encounter for antineoplastic immunotherapy: Secondary | ICD-10-CM

## 2016-10-10 DIAGNOSIS — Z170 Estrogen receptor positive status [ER+]: Principal | ICD-10-CM

## 2016-10-10 DIAGNOSIS — I10 Essential (primary) hypertension: Secondary | ICD-10-CM

## 2016-10-10 LAB — CBC WITH DIFFERENTIAL/PLATELET
BASO%: 0.3 % (ref 0.0–2.0)
BASOS ABS: 0 10*3/uL (ref 0.0–0.1)
EOS ABS: 0.2 10*3/uL (ref 0.0–0.5)
EOS%: 2.3 % (ref 0.0–7.0)
HEMATOCRIT: 39.1 % (ref 34.8–46.6)
HEMOGLOBIN: 12.8 g/dL (ref 11.6–15.9)
LYMPH#: 1.1 10*3/uL (ref 0.9–3.3)
LYMPH%: 15.5 % (ref 14.0–49.7)
MCH: 29.2 pg (ref 25.1–34.0)
MCHC: 32.7 g/dL (ref 31.5–36.0)
MCV: 89.3 fL (ref 79.5–101.0)
MONO#: 0.5 10*3/uL (ref 0.1–0.9)
MONO%: 7 % (ref 0.0–14.0)
NEUT#: 5.1 10*3/uL (ref 1.5–6.5)
NEUT%: 74.9 % (ref 38.4–76.8)
PLATELETS: 219 10*3/uL (ref 145–400)
RBC: 4.38 10*6/uL (ref 3.70–5.45)
RDW: 13.1 % (ref 11.2–14.5)
WBC: 6.8 10*3/uL (ref 3.9–10.3)

## 2016-10-10 LAB — COMPREHENSIVE METABOLIC PANEL
ALBUMIN: 3.5 g/dL (ref 3.5–5.0)
ALK PHOS: 113 U/L (ref 40–150)
ALT: 17 U/L (ref 0–55)
ANION GAP: 9 meq/L (ref 3–11)
AST: 18 U/L (ref 5–34)
BUN: 16.3 mg/dL (ref 7.0–26.0)
CALCIUM: 9.6 mg/dL (ref 8.4–10.4)
CO2: 26 mEq/L (ref 22–29)
Chloride: 110 mEq/L — ABNORMAL HIGH (ref 98–109)
Creatinine: 0.8 mg/dL (ref 0.6–1.1)
EGFR: 78 mL/min/{1.73_m2} — ABNORMAL LOW (ref >90)
Glucose: 105 mg/dl (ref 70–140)
POTASSIUM: 4.4 meq/L (ref 3.5–5.1)
Sodium: 145 mEq/L (ref 136–145)
Total Bilirubin: 0.31 mg/dL (ref 0.20–1.20)
Total Protein: 6.9 g/dL (ref 6.4–8.3)

## 2016-10-10 MED ORDER — DIPHENHYDRAMINE HCL 25 MG PO CAPS
ORAL_CAPSULE | ORAL | Status: AC
  Filled 2016-10-10: qty 1

## 2016-10-10 MED ORDER — INFLUENZA VAC SPLIT QUAD 0.5 ML IM SUSY
0.5000 mL | PREFILLED_SYRINGE | Freq: Once | INTRAMUSCULAR | Status: AC
  Administered 2016-10-10: 0.5 mL via INTRAMUSCULAR
  Filled 2016-10-10: qty 0.5

## 2016-10-10 MED ORDER — INFLUENZA VAC SPLIT QUAD 0.5 ML IM SUSY
0.5000 mL | PREFILLED_SYRINGE | Freq: Once | INTRAMUSCULAR | Status: DC
  Filled 2016-10-10: qty 0.5

## 2016-10-10 MED ORDER — SODIUM CHLORIDE 0.9 % IV SOLN
Freq: Once | INTRAVENOUS | Status: AC
  Administered 2016-10-10: 09:00:00 via INTRAVENOUS

## 2016-10-10 MED ORDER — HEPARIN SOD (PORK) LOCK FLUSH 100 UNIT/ML IV SOLN
500.0000 [IU] | Freq: Once | INTRAVENOUS | Status: AC | PRN
  Administered 2016-10-10: 500 [IU]
  Filled 2016-10-10: qty 5

## 2016-10-10 MED ORDER — DIPHENHYDRAMINE HCL 25 MG PO CAPS
25.0000 mg | ORAL_CAPSULE | Freq: Once | ORAL | Status: AC
Start: 2016-10-10 — End: 2016-10-10
  Administered 2016-10-10: 25 mg via ORAL

## 2016-10-10 MED ORDER — CLONAZEPAM 0.5 MG PO TABS: 0.5000 mg | ORAL_TABLET | Freq: Every day | ORAL | 0 refills | Status: DC | PRN

## 2016-10-10 MED ORDER — ACETAMINOPHEN 325 MG PO TABS
ORAL_TABLET | ORAL | Status: AC
Start: 2016-10-10 — End: 2016-10-10
  Filled 2016-10-10: qty 2

## 2016-10-10 MED ORDER — TRASTUZUMAB CHEMO 150 MG IV SOLR
6.0000 mg/kg | Freq: Once | INTRAVENOUS | Status: AC
  Administered 2016-10-10: 525 mg via INTRAVENOUS
  Filled 2016-10-10: qty 25

## 2016-10-10 MED ORDER — SODIUM CHLORIDE 0.9 % IJ SOLN
10.0000 mL | INTRAMUSCULAR | Status: DC | PRN
  Administered 2016-10-10: 10 mL
  Filled 2016-10-10: qty 10

## 2016-10-10 MED ORDER — ACETAMINOPHEN 325 MG PO TABS
650.0000 mg | ORAL_TABLET | Freq: Once | ORAL | Status: AC
  Administered 2016-10-10: 650 mg via ORAL

## 2016-10-10 NOTE — Telephone Encounter (Signed)
Message sent to chemo scheduler to be added.  AVS report and appointment schedule given to patient, per 10/10/16 los. ° °

## 2016-10-10 NOTE — Patient Instructions (Signed)
[facility] Discharge Instructions for Patients Receiving Chemotherapy  Today you received the following chemotherapy agents: Herceptin   To help prevent nausea and vomiting after your treatment, we encourage you to take your nausea medication as directed.    If you develop nausea and vomiting that is not controlled by your nausea medication, call the clinic.   BELOW ARE SYMPTOMS THAT SHOULD BE REPORTED IMMEDIATELY:  *FEVER GREATER THAN 100.5 F  *CHILLS WITH OR WITHOUT FEVER  NAUSEA AND VOMITING THAT IS NOT CONTROLLED WITH YOUR NAUSEA MEDICATION  *UNUSUAL SHORTNESS OF BREATH  *UNUSUAL BRUISING OR BLEEDING  TENDERNESS IN MOUTH AND THROAT WITH OR WITHOUT PRESENCE OF ULCERS  *URINARY PROBLEMS  *BOWEL PROBLEMS  UNUSUAL RASH Items with * indicate a potential emergency and should be followed up as soon as possible.  Feel free to call the clinic you have any questions or concerns. The clinic phone number is [phone].  Please show the CHEMO ALERT CARD at check-in to the Emergency Department and triage nurse.   

## 2016-10-10 NOTE — Progress Notes (Signed)
[name]  [phone] [phone]  Clinic Follow up Note   Patient Care Team: [name], MD as PCP - General (Nurse Practitioner) 10/10/2016  SUMMARY OF ONCOLOGIC HISTORY:  Oncology History   Breast cancer of upper-outer quadrant of right female breast   Staging form: Breast, AJCC 7th Edition     Clinical: Stage IV (T2, N1, M1) - Signed by Truitt Merle, MD on 12/29/2014       Prognostic indicators: ER/PR/HER2NEU +      Pathologic: No stage assigned - Unsigned       Prognostic indicators: ER/PR/HER2NEU +        Breast cancer of upper-outer quadrant of right female breast ([facility])   01/03/2014 Initial Diagnosis    Breast cancer of upper-outer quadrant of right female breast, IDA, grade 2, ER 90%+, PR 1%+, her2 (+)      01/20/2014 PET scan    Hypermetabolic mass in the posterior right breast (primary), 2 additional nodules in deep right breast (likely nodes) and 3 hyermetabolic portal nodes (lasrgest 2cm, SUV 13).       02/03/2014 - 05/20/2014 Neo-Adjuvant Chemotherapy    docetaxel, Taxotere, Carboplatin, Herceptin, Perjeta for 6 cycles       06/01/2014 PET scan    Minimal residual hypermetabolic activity within the residual mass inferolaterally in the right breast, likely surgical change. No  other hypermetabolic node or lesion.       06/10/2014 -  Anti-estrogen oral therapy    Anastrozole 1 mg once daily      06/10/2014 -  Chemotherapy    Maintenance Herceptin every 3 weeks      06/22/2014 Surgery    right mastectomy with negative margines and axillary node dissection. [id number], complete patholigical resposne.       05/10/2015 Imaging    No convincing evidence of hypermetabolic metastatic disease.Right level 2 node is decreased in size and hypermetabolism. Favored to be reactive.       CURRENT THERAPY:  1.Trastuzumab 6 mg/kg every 3 weeks indefinitely, changed to every 4 weeks from Nov 2016 2. Anastrozole 1 mg once daily  INTERVAL  HISTORY: [name] returns for follow-up and Herceptin treatment. She is doing well overall, No significant change clinically. She denies any significant pain or other complaints. She goes to work very early in the morning, does feel tired after work. She has insomnia, takes Ambien as needed. She is also under some stress due to her mother's health issue. She otherwise feels well, weight is stable.  REVIEW OF SYSTEMS:   Constitutional: Denies fevers, chills or abnormal weight loss Eyes: Denies blurriness of vision Ears, nose, mouth, throat, and face: Denies mucositis or sore throat Respiratory: Denies cough, dyspnea or wheezes Cardiovascular: Denies palpitation, chest discomfort or lower extremity swelling Gastrointestinal:  Denies nausea, heartburn or change in bowel habits Skin: Denies abnormal skin rashes Lymphatics: Denies new lymphadenopathy or easy bruising Neurological:Denies numbness, tingling or new weaknesses Behavioral/Psych: Mood is stable, no new changes  All other systems were reviewed with the patient and are negative.  MEDICAL HISTORY:  Past Medical History:  Diagnosis Date  . Allergy   . Anemia ~ 04/2014  . Anxiety   . Arthritis    back  . Breast cancer ([place]) 12/16/13    Invasive ductal Carcinoma; 1/1 nodes positive / self palpated  . Diverticulitis    history of  . History of blood transfusion ~ 04/2014   "related to chemo"  . Inguinal hernia    Bil  .  PONV (postoperative nausea and vomiting)    Pt denies  . Septic shock ([place] [place])    from chemo in 2015  . Wears glasses     SURGICAL HISTORY: Past Surgical History:  Procedure Laterality Date  . AXILLARY LYMPH NODE BIOPSY Right 12/2013  . BREAST BIOPSY Right 12/2013  . CHOLECYSTECTOMY  2005  . COLON RESECTION  2004   "for abscess on my colon"  . HERNIA REPAIR  2005   umb  . MASTECTOMY W/ SENTINEL NODE BIOPSY Right 06/22/2014   Procedure: RIGHT TOTAL MASTECTOMY WITH SENTINEL LYMPH NODE BIOPSY;  Surgeon: Rolm Bookbinder, MD;  Location: [facility];  Service: General;  Laterality: Right;  . PORTACATH PLACEMENT Left 01/31/2014   Procedure: INSERTION PORT-A-CATH;  Surgeon: Rolm Bookbinder, MD;  Location: [facility];  Service: General;  Laterality: Left;    I have reviewed the social history and family history with the patient and they are unchanged from previous note.  ALLERGIES:  is allergic to ciprofloxacin.  MEDICATIONS:  Current Outpatient Prescriptions  Medication Sig Dispense Refill  . acetaminophen (TYLENOL) 500 MG tablet Take 1,000 mg by mouth every 6 (six) hours as needed for mild pain.    Marland Kitchen anastrozole (ARIMIDEX) 1 MG tablet Take 1 tablet (1 mg total) by mouth daily. 90 tablet 3  . cetirizine (ZYRTEC) 10 MG tablet Take 10 mg by mouth daily.     . clonazePAM (KLONOPIN) 0.5 MG tablet Take 1 tablet (0.5 mg total) by mouth daily as needed for anxiety. 30 tablet 0  . Cyanocobalamin (VITAMIN B 12 PO) Take 1,000 mcg by mouth daily.    Marland Kitchen ibuprofen (ADVIL,MOTRIN) 800 MG tablet Take 1 tablet (800 mg total) by mouth every 8 (eight) hours as needed for moderate pain. 90 tablet 2  . lidocaine-prilocaine (EMLA) cream APPLY EXTERNALLY TO AFFECTED AREA(S) AS NEEDED 30 g 1  . lisinopril (ZESTRIL) 10 MG tablet Take 1 tablet (10 mg total) by mouth at bedtime. 90 tablet 3  . zolpidem (AMBIEN CR) 12.5 MG CR tablet Take 1 tablet (12.5 mg total) by mouth at bedtime as needed for sleep. 30 tablet 3   Current Facility-Administered Medications  Medication Dose Route Frequency Provider Last Rate Last Dose  . Influenza vac split quadrivalent PF (FLUARIX) injection 0.5 mL  0.5 mL Intramuscular Once Truitt Merle, MD       Facility-Administered Medications Ordered in Other Visits  Medication Dose Route Frequency Provider Last Rate Last Dose  . sodium chloride 0.9 % injection 10 mL  10 mL Intracatheter PRN Truitt Merle, MD   10 mL at 09/15/15 1129    PHYSICAL EXAMINATION: ECOG PERFORMANCE STATUS: 1  Vitals:   10/10/16 0829  BP:  (!) 169/83  Pulse: 79  Resp: 18  Temp: 98.7 F (37.1 C)   Filed Weights   10/10/16 0829  Weight: 198 lb (89.8 kg)    GENERAL:alert, no distress and comfortable SKIN: skin color, texture, turgor are normal, no rashes or significant lesions(+) Diffuse small  Papular skin rash on her forearms, no blister, no skin erythema. OROPHARYNX:no exudate, no erythema and lips, buccal mucosa, and tongue normal  NECK: supple, thyroid normal size, non-tender, without nodularity LYMPH:  no palpable lymphadenopathy in the cervical, axillary or inguinal LUNGS: clear to auscultation and percussion with normal breathing effort HEART: regular rate & rhythm and no murmurs and no lower extremity edema ABDOMEN:abdomen soft, non-tender and normal bowel sounds, no organomegaly, (+) abdominal wall hernia in the right lower quadrant.  Musculoskeletal:no  cyanosis of digits and no clubbing  NEURO: alert & oriented x 3 with fluent speech, no focal motor/sensory deficits Right breast surgically absent,  no palpable lesions on chest wall or axilla note. Surgical scar is well healed. Left breast exam no palpable mass, skin change or nipple discharge.  LABORATORY DATA:  I have reviewed the data as listed CBC Latest Ref Rng & Units 10/10/2016 09/13/2016 08/16/2016  WBC 3.9 - 10.3 10e3/uL 6.8 6.6 6.9  Hemoglobin 11.6 - 15.9 g/dL 12.8 13.1 13.8  Hematocrit 34.8 - 46.6 % 39.1 40.1 42.2  Platelets 145 - 400 10e3/uL 219 226 233     CMP Latest Ref Rng & Units 10/10/2016 09/13/2016 08/16/2016  Glucose 70 - 140 mg/dl 105 115 91  BUN 7.0 - 26.0 mg/dL 16.3 19.4 20.0  Creatinine 0.6 - 1.1 mg/dL 0.8 0.8 0.8  Sodium 136 - 145 mEq/L 145 144 145  Potassium 3.5 - 5.1 mEq/L 4.4 4.0 4.0  Chloride 96 - 112 mEq/L - - -  CO2 22 - 29 mEq/L 26 26 27  Calcium 8.4 - 10.4 mg/dL 9.6 9.9 9.9  Total Protein 6.4 - 8.3 g/dL 6.9 7.3 7.4  Total Bilirubin 0.20 - 1.20 mg/dL 0.31 0.43 0.50  Alkaline Phos 40 - 150 U/L 113 120 121  AST 5 - 34 U/L  18 16 19  ALT 0 - 55 U/L 17 18 20      RADIOGRAPHIC STUDIES: I have personally reviewed the radiological images as listed and agreed with the findings in the report.  PET/CT on 06/03/2016 IMPRESSION: 1. No evidence of hypermetabolic recurrent or metastatic disease. 2. Anal hypermetabolism again identified and warrants physical exam correlation. 3. Incidental findings, including complex right abdominal wall hernia and laxity containing nonobstructive bowel.   Echo 03/18/2016 Study Conclusions  - Left ventricle: The cavity size was normal. Wall thickness was   normal. Systolic function was normal. The estimated ejection   fraction was in the range of 60% to 65%. Wall motion was normal;   there were no regional wall motion abnormalities. Doppler   parameters are consistent with abnormal left ventricular   relaxation (grade 1 diastolic dysfunction). - Impressions: GLS -18.2% (underestimated), Lateral s&' 10.9 cm/s.  Impressions:  - GLS -18.2% (underestimated), Lateral s&' 10.9 cm/s.   ASSESSMENT AND PLAN  59-year-old postmenopausal woman, was found to have a 3.3cm right breast mass and a positive axillary lymph nodes, biopsy confirmed invasive ductal carcinoma, ER positive PR positive HER-2 positive.  Her initial PET scan also reviewed 3 hypermetabolic nodes in periportal, measuring up to 2 cm with SUV 14. The periportal node was not bopsied, but likely represented metastatic nodes. She had completed response to neoadjuvant chemotherapy and underwent right mastectomy.  1. Breast cancer of upper outer quadrant of right female breast, G2 IDC, with presumed metastasis to to periportal nodes, ER+/PR+/HER2+ -Her case was reviewed in our tumor board previously. Her initial and a post treatment PET scan was reviewed again. Due to the size and metabolic activity of those periportal lymph nodes at diagnosis, she likely has metastatic breast cancer. And the consensus was to continue  therapy indefinitely. -Although she had excellent compete response to chemotherapy, She is at VERY high risk for cancer recurrence due to the nature of metastatic disease. We recommend her to continue maintenance Herceptin and anastrozole indefinitely. She is little reluctant due to the financial stress (she has $6K annual deductible).  -Due to her work schedule, we have changed her Herceptin to every   4 weeks. -We discussed her restaging PET scan from June 2017, which showed no evidence of disease. -She has been on Herceptin for 2 and half years, if her next PET scan still remains negative, I think it's reasonable to stop Herceptin, and continue her anastrozole indefinitely.  -her last echo showed normal EF in 07/2016 she follows up with his cardiologist Dr. Bensimhon  - her recent left breat mammogram was normal - she is clinically doing well,  No clinical concern for disease progression.I encouraged her to continue exercise and remain to be active. -I reviewed her lab results with her today, both CBC and CMP are within normal limits,  We'll continue Herceptin.  2. Hypermetabolic uptake at anus -She has persistent hypermetabolic uptake at the anus from the past 2 PET scans -She is clinically asymptomatic, no pain, hematochezia or constipation. -rectal exam was negative for mass or bleeding. -She has not had colonoscopy. I strongly encouraged her to consider a colonoscopy, she is agreeable now, she wants to get it done in [place] some time this year   3. Anxiety and depression, insomnia  -Continue clonazepam as needed -She tried zoloft and lexapro but could not tolerated.  -She is still on Ambien for insomnia  4. Vit D deficiency and bone health  -Her vitamin D level was 10 on 03/10/2015 -She received Vit D 50,000u weekly x6 weeks then 1000u daily  -I strongly encouraged her to continue taking calcium -Her DEXA scan showed normal bone density in 09/2015  -I discussed that anastrozole will  likely weak her bone, and reviewed the treatment option for osteoporosis. She voiced good understanding.   Plan -Continue Herceptin every 4 weeks, continue anastrozole -I refilled her clonazepam -I will see her back in 8 weeks, with restaging PET 2 days before her next visit   All questions were answered. The patient knows to call the clinic with any problems, questions or concerns. No barriers to learning was detected.  I spent 20 minutes counseling the patient face to face. The total time spent in the appointment was 25 minutes and more than 50% was on counseling and review of test results   Feng, Yan, MD 10/10/2016 

## 2016-10-10 NOTE — Telephone Encounter (Signed)
Appointments complete per 10/19 los. Lab/flush and f/u already on schedule as ordered per los. Added infusions for 11/17 and 12/15. Gave patient avs report and appointments for November and December.

## 2016-10-11 ENCOUNTER — Ambulatory Visit: Payer: BLUE CROSS/BLUE SHIELD

## 2016-11-08 ENCOUNTER — Ambulatory Visit (HOSPITAL_BASED_OUTPATIENT_CLINIC_OR_DEPARTMENT_OTHER): Payer: BLUE CROSS/BLUE SHIELD

## 2016-11-08 ENCOUNTER — Other Ambulatory Visit (HOSPITAL_BASED_OUTPATIENT_CLINIC_OR_DEPARTMENT_OTHER): Payer: BLUE CROSS/BLUE SHIELD

## 2016-11-08 VITALS — BP 138/87 | HR 83 | Temp 98.2°F | Resp 16

## 2016-11-08 DIAGNOSIS — C50411 Malignant neoplasm of upper-outer quadrant of right female breast: Secondary | ICD-10-CM

## 2016-11-08 DIAGNOSIS — Z5112 Encounter for antineoplastic immunotherapy: Secondary | ICD-10-CM

## 2016-11-08 DIAGNOSIS — Z170 Estrogen receptor positive status [ER+]: Principal | ICD-10-CM

## 2016-11-08 LAB — CBC WITH DIFFERENTIAL/PLATELET
BASO%: 1 % (ref 0.0–2.0)
Basophils Absolute: 0.1 10*3/uL (ref 0.0–0.1)
EOS%: 2.6 % (ref 0.0–7.0)
Eosinophils Absolute: 0.2 10*3/uL (ref 0.0–0.5)
HCT: 41.1 % (ref 34.8–46.6)
HGB: 13.3 g/dL (ref 11.6–15.9)
LYMPH%: 17.2 % (ref 14.0–49.7)
MCH: 28.4 pg (ref 25.1–34.0)
MCHC: 32.5 g/dL (ref 31.5–36.0)
MCV: 87.5 fL (ref 79.5–101.0)
MONO#: 0.4 10*3/uL (ref 0.1–0.9)
MONO%: 5.5 % (ref 0.0–14.0)
NEUT%: 73.7 % (ref 38.4–76.8)
NEUTROS ABS: 5.5 10*3/uL (ref 1.5–6.5)
Platelets: 252 10*3/uL (ref 145–400)
RBC: 4.7 10*6/uL (ref 3.70–5.45)
RDW: 13.6 % (ref 11.2–14.5)
WBC: 7.5 10*3/uL (ref 3.9–10.3)
lymph#: 1.3 10*3/uL (ref 0.9–3.3)

## 2016-11-08 LAB — COMPREHENSIVE METABOLIC PANEL
ALT: 21 U/L (ref 0–55)
AST: 20 U/L (ref 5–34)
Albumin: 3.6 g/dL (ref 3.5–5.0)
Alkaline Phosphatase: 130 U/L (ref 40–150)
Anion Gap: 10 mEq/L (ref 3–11)
BUN: 15.9 mg/dL (ref 7.0–26.0)
CHLORIDE: 107 meq/L (ref 98–109)
CO2: 27 meq/L (ref 22–29)
Calcium: 9.9 mg/dL (ref 8.4–10.4)
Creatinine: 0.8 mg/dL (ref 0.6–1.1)
EGFR: 80 mL/min/{1.73_m2} — AB (ref >90)
GLUCOSE: 108 mg/dL (ref 70–140)
POTASSIUM: 4.2 meq/L (ref 3.5–5.1)
SODIUM: 144 meq/L (ref 136–145)
Total Bilirubin: 0.44 mg/dL (ref 0.20–1.20)
Total Protein: 7.4 g/dL (ref 6.4–8.3)

## 2016-11-08 MED ORDER — SODIUM CHLORIDE 0.9 % IJ SOLN
10.0000 mL | INTRAMUSCULAR | Status: DC | PRN
  Administered 2016-11-08: 10 mL
  Filled 2016-11-08: qty 10

## 2016-11-08 MED ORDER — ACETAMINOPHEN 325 MG PO TABS
ORAL_TABLET | ORAL | Status: AC
  Filled 2016-11-08: qty 2

## 2016-11-08 MED ORDER — DIPHENHYDRAMINE HCL 25 MG PO CAPS
ORAL_CAPSULE | ORAL | Status: AC
  Filled 2016-11-08: qty 2

## 2016-11-08 MED ORDER — ACETAMINOPHEN 325 MG PO TABS
650.0000 mg | ORAL_TABLET | Freq: Once | ORAL | Status: AC
  Administered 2016-11-08: 650 mg via ORAL

## 2016-11-08 MED ORDER — DIPHENHYDRAMINE HCL 25 MG PO CAPS
25.0000 mg | ORAL_CAPSULE | Freq: Once | ORAL | Status: AC
  Administered 2016-11-08: 25 mg via ORAL

## 2016-11-08 MED ORDER — HEPARIN SOD (PORK) LOCK FLUSH 100 UNIT/ML IV SOLN
500.0000 [IU] | Freq: Once | INTRAVENOUS | Status: AC | PRN
  Administered 2016-11-08: 500 [IU]
  Filled 2016-11-08: qty 5

## 2016-11-08 MED ORDER — SODIUM CHLORIDE 0.9 % IV SOLN
Freq: Once | INTRAVENOUS | Status: AC
  Administered 2016-11-08: 10:00:00 via INTRAVENOUS

## 2016-11-08 MED ORDER — TRASTUZUMAB CHEMO 150 MG IV SOLR
6.0000 mg/kg | Freq: Once | INTRAVENOUS | Status: AC
  Administered 2016-11-08: 525 mg via INTRAVENOUS
  Filled 2016-11-08: qty 25

## 2016-12-03 ENCOUNTER — Ambulatory Visit (HOSPITAL_COMMUNITY)
Admission: RE | Admit: 2016-12-03 | Discharge: 2016-12-03 | Disposition: A | Discharge status: Home / Self Care | Payer: BLUE CROSS/BLUE SHIELD | Source: Ambulatory Visit | Attending: Hematology | Admitting: Hematology

## 2016-12-03 DIAGNOSIS — F419 Anxiety disorder, unspecified: Secondary | ICD-10-CM

## 2016-12-03 DIAGNOSIS — I10 Essential (primary) hypertension: Secondary | ICD-10-CM

## 2016-12-03 DIAGNOSIS — K760 Fatty (change of) liver, not elsewhere classified: Secondary | ICD-10-CM | POA: Diagnosis not present

## 2016-12-03 DIAGNOSIS — K439 Ventral hernia without obstruction or gangrene: Secondary | ICD-10-CM | POA: Insufficient documentation

## 2016-12-03 DIAGNOSIS — C50411 Malignant neoplasm of upper-outer quadrant of right female breast: Secondary | ICD-10-CM | POA: Diagnosis not present

## 2016-12-03 DIAGNOSIS — Z170 Estrogen receptor positive status [ER+]: Secondary | ICD-10-CM | POA: Insufficient documentation

## 2016-12-03 LAB — GLUCOSE, CAPILLARY: Glucose-Capillary: 112 mg/dL — ABNORMAL HIGH (ref 65–99)

## 2016-12-03 MED ORDER — FLUDEOXYGLUCOSE F - 18 (FDG) INJECTION
9.7000 | Freq: Once | INTRAVENOUS | Status: AC | PRN
  Administered 2016-12-03: 9.7 via INTRAVENOUS

## 2016-12-06 ENCOUNTER — Encounter: Payer: Self-pay | Admitting: Hematology

## 2016-12-06 ENCOUNTER — Ambulatory Visit (HOSPITAL_BASED_OUTPATIENT_CLINIC_OR_DEPARTMENT_OTHER): Payer: BLUE CROSS/BLUE SHIELD | Admitting: Hematology

## 2016-12-06 ENCOUNTER — Other Ambulatory Visit (HOSPITAL_BASED_OUTPATIENT_CLINIC_OR_DEPARTMENT_OTHER): Payer: BLUE CROSS/BLUE SHIELD

## 2016-12-06 ENCOUNTER — Telehealth: Payer: Self-pay | Admitting: Hematology

## 2016-12-06 ENCOUNTER — Ambulatory Visit (HOSPITAL_BASED_OUTPATIENT_CLINIC_OR_DEPARTMENT_OTHER): Payer: BLUE CROSS/BLUE SHIELD

## 2016-12-06 VITALS — BP 160/70 | HR 86 | Temp 99.0°F | Resp 18 | Ht 64.0 in | Wt 197.3 lb

## 2016-12-06 DIAGNOSIS — F419 Anxiety disorder, unspecified: Secondary | ICD-10-CM

## 2016-12-06 DIAGNOSIS — E559 Vitamin D deficiency, unspecified: Secondary | ICD-10-CM | POA: Diagnosis not present

## 2016-12-06 DIAGNOSIS — Z170 Estrogen receptor positive status [ER+]: Principal | ICD-10-CM

## 2016-12-06 DIAGNOSIS — C773 Secondary and unspecified malignant neoplasm of axilla and upper limb lymph nodes: Secondary | ICD-10-CM | POA: Diagnosis not present

## 2016-12-06 DIAGNOSIS — E669 Obesity, unspecified: Secondary | ICD-10-CM

## 2016-12-06 DIAGNOSIS — C50411 Malignant neoplasm of upper-outer quadrant of right female breast: Secondary | ICD-10-CM

## 2016-12-06 DIAGNOSIS — I10 Essential (primary) hypertension: Secondary | ICD-10-CM

## 2016-12-06 DIAGNOSIS — Z79811 Long term (current) use of aromatase inhibitors: Secondary | ICD-10-CM

## 2016-12-06 DIAGNOSIS — Z5112 Encounter for antineoplastic immunotherapy: Secondary | ICD-10-CM | POA: Diagnosis not present

## 2016-12-06 DIAGNOSIS — F329 Major depressive disorder, single episode, unspecified: Secondary | ICD-10-CM

## 2016-12-06 DIAGNOSIS — G4700 Insomnia, unspecified: Secondary | ICD-10-CM

## 2016-12-06 LAB — COMPREHENSIVE METABOLIC PANEL
ALBUMIN: 3.6 g/dL (ref 3.5–5.0)
ALK PHOS: 117 U/L (ref 40–150)
ALT: 22 U/L (ref 0–55)
AST: 21 U/L (ref 5–34)
Anion Gap: 10 mEq/L (ref 3–11)
BUN: 19.4 mg/dL (ref 7.0–26.0)
CALCIUM: 10.1 mg/dL (ref 8.4–10.4)
CO2: 27 mEq/L (ref 22–29)
CREATININE: 0.9 mg/dL (ref 0.6–1.1)
Chloride: 109 mEq/L (ref 98–109)
EGFR: 74 mL/min/{1.73_m2} — ABNORMAL LOW (ref >90)
Glucose: 96 mg/dl (ref 70–140)
Potassium: 4.2 mEq/L (ref 3.5–5.1)
Sodium: 146 mEq/L — ABNORMAL HIGH (ref 136–145)
Total Bilirubin: 0.26 mg/dL (ref 0.20–1.20)
Total Protein: 7.5 g/dL (ref 6.4–8.3)

## 2016-12-06 LAB — CBC WITH DIFFERENTIAL/PLATELET
BASO%: 1 % (ref 0.0–2.0)
Basophils Absolute: 0.1 10*3/uL (ref 0.0–0.1)
EOS%: 2.6 % (ref 0.0–7.0)
Eosinophils Absolute: 0.2 10*3/uL (ref 0.0–0.5)
HEMATOCRIT: 40.6 % (ref 34.8–46.6)
HEMOGLOBIN: 13.3 g/dL (ref 11.6–15.9)
LYMPH#: 1.3 10*3/uL (ref 0.9–3.3)
LYMPH%: 16.2 % (ref 14.0–49.7)
MCH: 28.5 pg (ref 25.1–34.0)
MCHC: 32.6 g/dL (ref 31.5–36.0)
MCV: 87.3 fL (ref 79.5–101.0)
MONO#: 0.5 10*3/uL (ref 0.1–0.9)
MONO%: 5.9 % (ref 0.0–14.0)
NEUT%: 74.3 % (ref 38.4–76.8)
NEUTROS ABS: 6.2 10*3/uL (ref 1.5–6.5)
Platelets: 271 10*3/uL (ref 145–400)
RBC: 4.65 10*6/uL (ref 3.70–5.45)
RDW: 14.2 % (ref 11.2–14.5)
WBC: 8.3 10*3/uL (ref 3.9–10.3)

## 2016-12-06 MED ORDER — SODIUM CHLORIDE 0.9 % IJ SOLN
10.0000 mL | INTRAMUSCULAR | Status: DC | PRN
  Administered 2016-12-06: 10 mL
  Filled 2016-12-06: qty 10

## 2016-12-06 MED ORDER — HEPARIN SOD (PORK) LOCK FLUSH 100 UNIT/ML IV SOLN
500.0000 [IU] | Freq: Once | INTRAVENOUS | Status: AC | PRN
  Administered 2016-12-06: 500 [IU]
  Filled 2016-12-06: qty 5

## 2016-12-06 MED ORDER — DIPHENHYDRAMINE HCL 25 MG PO CAPS
ORAL_CAPSULE | ORAL | Status: AC
  Filled 2016-12-06: qty 1

## 2016-12-06 MED ORDER — ZOLPIDEM TARTRATE ER 12.5 MG PO TBCR: 12.5000 mg | EXTENDED_RELEASE_TABLET | Freq: Every evening | ORAL | 3 refills | Status: DC | PRN

## 2016-12-06 MED ORDER — ACETAMINOPHEN 325 MG PO TABS
650.0000 mg | ORAL_TABLET | Freq: Once | ORAL | Status: AC
  Administered 2016-12-06: 650 mg via ORAL

## 2016-12-06 MED ORDER — ACETAMINOPHEN 325 MG PO TABS
ORAL_TABLET | ORAL | Status: AC
  Filled 2016-12-06: qty 2

## 2016-12-06 MED ORDER — DIPHENHYDRAMINE HCL 25 MG PO CAPS
25.0000 mg | ORAL_CAPSULE | Freq: Once | ORAL | Status: AC
  Administered 2016-12-06: 25 mg via ORAL

## 2016-12-06 MED ORDER — SODIUM CHLORIDE 0.9 % IV SOLN
Freq: Once | INTRAVENOUS | Status: AC
  Administered 2016-12-06: 16:00:00 via INTRAVENOUS

## 2016-12-06 MED ORDER — SODIUM CHLORIDE 0.9 % IV SOLN
6.0000 mg/kg | Freq: Once | INTRAVENOUS | Status: AC
  Administered 2016-12-06: 525 mg via INTRAVENOUS
  Filled 2016-12-06: qty 25

## 2016-12-06 NOTE — Progress Notes (Signed)
[facility]  [phone] [phone]  Clinic Follow up Note   Patient Care Team: [name], MD as PCP - General (Nurse Practitioner) 12/06/2016  SUMMARY OF ONCOLOGIC HISTORY:  Oncology History   Breast cancer of upper-outer quadrant of right female breast   Staging form: Breast, AJCC 7th Edition     Clinical: Stage IV (T2, N1, M1) - Signed by Truitt Merle, MD on 12/29/2014       Prognostic indicators: ER/PR/HER2NEU +      Pathologic: No stage assigned - Unsigned       Prognostic indicators: ER/PR/HER2NEU +        Breast cancer of upper-outer quadrant of right female breast ([place])   01/03/2014 Initial Diagnosis    Breast cancer of upper-outer quadrant of right female breast, IDA, grade 2, ER 90%+, PR 1%+, her2 (+)      01/20/2014 PET scan    Hypermetabolic mass in the posterior right breast (primary), 2 additional nodules in deep right breast (likely nodes) and 3 hyermetabolic portal nodes (lasrgest 2cm, SUV 13).       02/03/2014 - 05/20/2014 Neo-Adjuvant Chemotherapy    docetaxel, Taxotere, Carboplatin, Herceptin, Perjeta for 6 cycles       06/01/2014 PET scan    Minimal residual hypermetabolic activity within the residual mass inferolaterally in the right breast, likely surgical change. No  other hypermetabolic node or lesion.       06/10/2014 -  Anti-estrogen oral therapy    Anastrozole 1 mg once daily      06/10/2014 -  Chemotherapy    Maintenance Herceptin every 3 weeks      06/22/2014 Surgery    right mastectomy with negative margines and axillary node dissection. [id number], complete patholigical resposne.       05/10/2015 Imaging    No convincing evidence of hypermetabolic metastatic disease.Right level 2 node is decreased in size and hypermetabolism. Favored to be reactive.       CURRENT THERAPY:  1.Trastuzumab 6 mg/kg every 3 weeks since 05/2014, changed to every 4 weeks from Nov 2016 2. Anastrozole 1 mg once daily  INTERVAL  HISTORY: Madeline Davidson returns for follow-up and Herceptin treatment. She is doing well overall, No significant change clinically. She has chronic arthralgia of the left hip, manageable, doesn't limit her activities. She denies any other new pain, or other discomfort. She has a flare of hemorrhoids from constipation last week, resolved now. No other complains.   REVIEW OF SYSTEMS:   Constitutional: Denies fevers, chills or abnormal weight loss Eyes: Denies blurriness of vision Ears, nose, mouth, throat, and face: Denies mucositis or sore throat Respiratory: Denies cough, dyspnea or wheezes Cardiovascular: Denies palpitation, chest discomfort or lower extremity swelling Gastrointestinal:  Denies nausea, heartburn or change in bowel habits Skin: Denies abnormal skin rashes Lymphatics: Denies new lymphadenopathy or easy bruising Neurological:Denies numbness, tingling or new weaknesses Behavioral/Psych: Mood is stable, no new changes  All other systems were reviewed with the patient and are negative.  MEDICAL HISTORY:  Past Medical History:  Diagnosis Date  . Allergy   . Anemia ~ 04/2014  . Anxiety   . Arthritis    back  . Breast cancer ([place]) 12/16/13    Invasive ductal Carcinoma; 1/1 nodes positive / self palpated  . Diverticulitis    history of  . History of blood transfusion ~ 04/2014   "related to chemo"  . Inguinal hernia    Bil  . PONV (postoperative nausea and vomiting)  Pt denies  . Septic shock ([place])    from chemo in 2015  . Wears glasses     SURGICAL HISTORY: Past Surgical History:  Procedure Laterality Date  . AXILLARY LYMPH NODE BIOPSY Right 12/2013  . BREAST BIOPSY Right 12/2013  . CHOLECYSTECTOMY  2005  . COLON RESECTION  2004   "for abscess on my colon"  . HERNIA REPAIR  2005   umb  . MASTECTOMY W/ SENTINEL NODE BIOPSY Right 06/22/2014   Procedure: RIGHT TOTAL MASTECTOMY WITH SENTINEL LYMPH NODE BIOPSY;  Surgeon: Rolm Bookbinder, MD;  Location: [facility];  Service:  General;  Laterality: Right;  . PORTACATH PLACEMENT Left 01/31/2014   Procedure: INSERTION PORT-A-CATH;  Surgeon: Rolm Bookbinder, MD;  Location: [facility];  Service: General;  Laterality: Left;    I have reviewed the social history and family history with the patient and they are unchanged from previous note.  ALLERGIES:  is allergic to ciprofloxacin.  MEDICATIONS:  Current Outpatient Prescriptions  Medication Sig Dispense Refill  . acetaminophen (TYLENOL) 500 MG tablet Take 1,000 mg by mouth every 6 (six) hours as needed for mild pain.    Marland Kitchen anastrozole (ARIMIDEX) 1 MG tablet Take 1 tablet (1 mg total) by mouth daily. 90 tablet 3  . cetirizine (ZYRTEC) 10 MG tablet Take 10 mg by mouth daily.     . clonazePAM (KLONOPIN) 0.5 MG tablet Take 1 tablet (0.5 mg total) by mouth daily as needed for anxiety. 30 tablet 0  . Cyanocobalamin (VITAMIN B 12 PO) Take 1,000 mcg by mouth daily.    Marland Kitchen ibuprofen (ADVIL,MOTRIN) 800 MG tablet Take 1 tablet (800 mg total) by mouth every 8 (eight) hours as needed for moderate pain. 90 tablet 2  . lidocaine-prilocaine (EMLA) cream APPLY EXTERNALLY TO AFFECTED AREA(S) AS NEEDED 30 g 1  . lisinopril (ZESTRIL) 10 MG tablet Take 1 tablet (10 mg total) by mouth at bedtime. 90 tablet 3  . zolpidem (AMBIEN CR) 12.5 MG CR tablet Take 1 tablet (12.5 mg total) by mouth at bedtime as needed for sleep. 30 tablet 3   No current facility-administered medications for this visit.    Facility-Administered Medications Ordered in Other Visits  Medication Dose Route Frequency Provider Last Rate Last Dose  . heparin lock flush 100 unit/mL  500 Units Intracatheter Once PRN Truitt Merle, MD      . sodium chloride 0.9 % injection 10 mL  10 mL Intracatheter PRN Truitt Merle, MD   10 mL at 09/15/15 1129  . sodium chloride 0.9 % injection 10 mL  10 mL Intracatheter PRN Truitt Merle, MD      . trastuzumab (HERCEPTIN) 525 mg in sodium chloride 0.9 % 250 mL chemo infusion  6 mg/kg (Treatment Plan Recorded)  Intravenous Once Truitt Merle, MD 550 mL/hr at 12/06/16 1542 525 mg at 12/06/16 1542    PHYSICAL EXAMINATION: ECOG PERFORMANCE STATUS: 1  Vitals:   12/06/16 1411  BP: (!) 160/70  Pulse: 86  Resp: 18  Temp: 99 F (37.2 C)   Filed Weights   12/06/16 1411  Weight: 197 lb 4.8 oz (89.5 kg)    GENERAL:alert, no distress and comfortable SKIN: skin color, texture, turgor are normal, no rashes or significant lesions(+) Diffuse small  Papular skin rash on her forearms, no blister, no skin erythema. OROPHARYNX:no exudate, no erythema and lips, buccal mucosa, and tongue normal  NECK: supple, thyroid normal size, non-tender, without nodularity LYMPH:  no palpable lymphadenopathy in the cervical, axillary or inguinal  LUNGS: clear to auscultation and percussion with normal breathing effort HEART: regular rate & rhythm and no murmurs and no lower extremity edema ABDOMEN:abdomen soft, non-tender and normal bowel sounds, no organomegaly, (+) abdominal wall hernia in the right lower quadrant.  Musculoskeletal:no cyanosis of digits and no clubbing  NEURO: alert & oriented x 3 with fluent speech, no focal motor/sensory deficits Right breast surgically absent,  no palpable lesions on chest wall or axilla note. Surgical scar is well healed. Left breast exam no palpable mass, skin change or nipple discharge.  LABORATORY DATA:  I have reviewed the data as listed CBC Latest Ref Rng & Units 12/06/2016 11/08/2016 10/10/2016  WBC 3.9 - 10.3 10e3/uL 8.3 7.5 6.8  Hemoglobin 11.6 - 15.9 g/dL 13.3 13.3 12.8  Hematocrit 34.8 - 46.6 % 40.6 41.1 39.1  Platelets 145 - 400 10e3/uL 271 252 219     CMP Latest Ref Rng & Units 12/06/2016 11/08/2016 10/10/2016  Glucose 70 - 140 mg/dl 96 108 105  BUN 7.0 - 26.0 mg/dL 19.4 15.9 16.3  Creatinine 0.6 - 1.1 mg/dL 0.9 0.8 0.8  Sodium 136 - 145 mEq/L 146(H) 144 145  Potassium 3.5 - 5.1 mEq/L 4.2 4.2 4.4  Chloride 96 - 112 mEq/L - - -  CO2 22 - 29 mEq/L '27 27 26  '$ Calcium  8.4 - 10.4 mg/dL 10.1 9.9 9.6  Total Protein 6.4 - 8.3 g/dL 7.5 7.4 6.9  Total Bilirubin 0.20 - 1.20 mg/dL 0.26 0.44 0.31  Alkaline Phos 40 - 150 U/L 117 130 113  AST 5 - 34 U/L '21 20 18  '$ ALT 0 - 55 U/L '22 21 17      '$ RADIOGRAPHIC STUDIES: I have personally reviewed the radiological images as listed and agreed with the findings in the report.  PET/CT on 12/03/2016 IMPRESSION: 1. No hypermetabolic recurrent or metastatic disease. 2. Increase in anal hypermetabolism, as detailed on prior exams. 3. Hepatic steatosis. 4. Grossly similar abdominopelvic wall laxity and hernia containing nonobstructive large and small bowel.   Echo 03/18/2016 Study Conclusions  - Left ventricle: The cavity size was normal. Wall thickness was   normal. Systolic function was normal. The estimated ejection   fraction was in the range of 60% to 65%. Wall motion was normal;   there were no regional wall motion abnormalities. Doppler   parameters are consistent with abnormal left ventricular   relaxation (grade 1 diastolic dysfunction). - Impressions: GLS -18.2% (underestimated), Lateral s&' 10.9 cm/s.  Impressions:  - GLS -18.2% (underestimated), Lateral s&' 10.9 cm/s.   ASSESSMENT AND PLAN 60 y.o.  postmenopausal woman, was found to have a 3.3cm right breast mass and a positive axillary lymph nodes, biopsy confirmed invasive ductal carcinoma, ER positive PR positive HER-2 positive.  Her initial PET scan also reviewed 3 hypermetabolic nodes in periportal, measuring up to 2 cm with SUV 14. The periportal node was not bopsied, but likely represented metastatic nodes. She had completed response to neoadjuvant chemotherapy and underwent right mastectomy.  1. Breast cancer of upper outer quadrant of right female breast, G2 IDC, with presumed metastasis to to periportal nodes, ER+/PR+/HER2+ -Her case was reviewed in our tumor board previously. Her initial and post treatment PET scan was reviewed again.  Due to the size and metabolic activity of those periportal lymph nodes at diagnosis, she likely has metastatic breast cancer, but she did not have biopsy to confirm it. And the consensus was to continue therapy indefinitely. -Although she had excellent compete response to chemotherapy,  She is at VERY high risk for cancer recurrence due to the nature of metastatic disease. We recommend her to continue maintenance Herceptin and anastrozole indefinitely. She is little reluctant due to the financial stress (she has $6K annual deductible).  -Due to her work schedule, we have changed her Herceptin to every 4 weeks. -We discussed her restaging PET scan from June 2017, which showed no evidence of disease. -She has been on Herceptin for almost 3 yearsrs, I think it's reasonable to stop Herceptin, and continue her anastrozole indefinitely.  -her last echo showed normal EF in 07/2016 she follows up with his cardiologist Dr. Haroldine Laws  - her recent left breat mammogram was normal - she is clinically doing well,  No clinical concern for disease progression.I encouraged her to continue exercise and remain to be active. -I reviewed her lab results with her today, both CBC and CMP are within normal limits, her clinical exam was unremarkable. We'll proceed last Herceptin treatment today. -All continues surveillance PET scan every 4-6 months for the next year  2. Hypermetabolic uptake at anus -She has persistent hypermetabolic uptake at the anus from the past 3 PET scans -She is clinically asymptomatic, no pain, hematochezia or constipation. -rectal exam was negative for mass or bleeding. -She has not had colonoscopy. I have repeated encouraged her to consider a colonoscopy, she is agreeable, but has not called for appointment. she wants to get it done in [place].   3. Anxiety and depression, insomnia  -Continue clonazepam as needed -She tried zoloft and lexapro but could not tolerated.  -She is still on Ambien  for insomnia, we discussed weaning her off Ambien, I encouraged her to establish her care with a primary care physician, and consider switch ambien to other medication for insomnia   4. Vit D deficiency and bone health  -Her vitamin D level was 10 on 03/10/2015 -She received Vit D 50,000u weekly x6 weeks then 1000u daily  -I strongly encouraged her to continue taking calcium -Her DEXA scan showed normal bone density in 09/2015  -I discussed that anastrozole will likely weak her bone, and reviewed the treatment option for osteoporosis. She voiced good understanding.   Plan -last dose Herceptin today, will stop afterwards. She will continue anastrozole -I refilled her Lorrin Mais  -I will see her back in 4 month, with restaging PET 2 days before her next visit  -port flush every 4-6 weeks   All questions were answered. The patient knows to call the clinic with any problems, questions or concerns. No barriers to learning was detected.  I spent 20 minutes counseling the patient face to face. The total time spent in the appointment was 25 minutes and more than 50% was on counseling and review of test results   Truitt Merle, MD 12/06/2016

## 2016-12-06 NOTE — Telephone Encounter (Signed)
Appointments scheduled per 12/15 LOS. Patient given AVS report and calendars with future scheduled appointments. Patient requested to not have port flush appointments scheduled. She stated it has not been done and she does not wish for the appointments to be scheduled.

## 2016-12-06 NOTE — Patient Instructions (Signed)
[facility] Discharge Instructions for Patients Receiving Chemotherapy  Today you received the following chemotherapy agents: Herceptin   To help prevent nausea and vomiting after your treatment, we encourage you to take your nausea medication as directed.    If you develop nausea and vomiting that is not controlled by your nausea medication, call the clinic.   BELOW ARE SYMPTOMS THAT SHOULD BE REPORTED IMMEDIATELY:  *FEVER GREATER THAN 100.5 F  *CHILLS WITH OR WITHOUT FEVER  NAUSEA AND VOMITING THAT IS NOT CONTROLLED WITH YOUR NAUSEA MEDICATION  *UNUSUAL SHORTNESS OF BREATH  *UNUSUAL BRUISING OR BLEEDING  TENDERNESS IN MOUTH AND THROAT WITH OR WITHOUT PRESENCE OF ULCERS  *URINARY PROBLEMS  *BOWEL PROBLEMS  UNUSUAL RASH Items with * indicate a potential emergency and should be followed up as soon as possible.  Feel free to call the clinic you have any questions or concerns. The clinic phone number is [phone].  Please show the CHEMO ALERT CARD at check-in to the Emergency Department and triage nurse.   

## 2017-03-07 ENCOUNTER — Telehealth: Payer: Self-pay | Admitting: Hematology

## 2017-03-07 NOTE — Telephone Encounter (Signed)
Patient called and said "I need to make an appointment to have my port flushed.  It needs to be next Monday or Tuesday anything later than that they will need to call me to discuss the time and date"  Call back is [phone]

## 2017-03-25 ENCOUNTER — Ambulatory Visit (HOSPITAL_BASED_OUTPATIENT_CLINIC_OR_DEPARTMENT_OTHER): Payer: BLUE CROSS/BLUE SHIELD

## 2017-03-25 DIAGNOSIS — C50411 Malignant neoplasm of upper-outer quadrant of right female breast: Secondary | ICD-10-CM

## 2017-03-25 DIAGNOSIS — Z95828 Presence of other vascular implants and grafts: Secondary | ICD-10-CM

## 2017-03-25 DIAGNOSIS — Z452 Encounter for adjustment and management of vascular access device: Secondary | ICD-10-CM

## 2017-03-25 MED ORDER — SODIUM CHLORIDE 0.9% FLUSH
10.0000 mL | INTRAVENOUS | Status: DC | PRN
  Administered 2017-03-25: 10 mL via INTRAVENOUS
  Filled 2017-03-25: qty 10

## 2017-03-25 MED ORDER — HEPARIN SOD (PORK) LOCK FLUSH 100 UNIT/ML IV SOLN
500.0000 [IU] | Freq: Once | INTRAVENOUS | Status: AC | PRN
  Administered 2017-03-25: 500 [IU] via INTRAVENOUS
  Filled 2017-03-25: qty 5

## 2017-04-05 ENCOUNTER — Other Ambulatory Visit (HOSPITAL_COMMUNITY): Payer: Self-pay | Admitting: Internal Medicine

## 2017-04-05 DIAGNOSIS — I10 Essential (primary) hypertension: Secondary | ICD-10-CM

## 2017-04-07 ENCOUNTER — Other Ambulatory Visit: Payer: BLUE CROSS/BLUE SHIELD

## 2017-04-09 ENCOUNTER — Encounter: Payer: BLUE CROSS/BLUE SHIELD | Admitting: Hematology

## 2017-04-14 NOTE — Progress Notes (Signed)
This encounter was created in error - please disregard.

## 2017-04-16 ENCOUNTER — Other Ambulatory Visit: Payer: Self-pay | Admitting: *Deleted

## 2017-04-16 ENCOUNTER — Telehealth: Payer: Self-pay | Admitting: *Deleted

## 2017-04-16 MED ORDER — ZOLPIDEM TARTRATE ER 12.5 MG PO TBCR: 12.5000 mg | EXTENDED_RELEASE_TABLET | Freq: Every evening | ORAL | 0 refills | Status: DC | PRN

## 2017-04-16 NOTE — Telephone Encounter (Signed)
Received vm call from pt stating that she has moved her PET scan to 05/05/17 & will be out of ambien before next visit.  Noted that pt does not have f/u MD appt.  Schedule message to [place] to check on this.  Will refill ambien to next appt.

## 2017-04-17 ENCOUNTER — Telehealth: Payer: Self-pay | Admitting: Hematology

## 2017-04-17 NOTE — Telephone Encounter (Signed)
Called patient to inform her of next scheduled appointmet. Spoke with mother and tried work number as well.

## 2017-04-22 ENCOUNTER — Encounter (HOSPITAL_COMMUNITY): Payer: BLUE CROSS/BLUE SHIELD

## 2017-04-27 ENCOUNTER — Other Ambulatory Visit: Payer: Self-pay | Admitting: Hematology

## 2017-04-27 DIAGNOSIS — Z170 Estrogen receptor positive status [ER+]: Principal | ICD-10-CM

## 2017-04-27 DIAGNOSIS — C50411 Malignant neoplasm of upper-outer quadrant of right female breast: Secondary | ICD-10-CM

## 2017-05-05 ENCOUNTER — Ambulatory Visit (HOSPITAL_COMMUNITY)
Admission: RE | Admit: 2017-05-05 | Discharge: 2017-05-05 | Disposition: A | Discharge status: Home / Self Care | Payer: BLUE CROSS/BLUE SHIELD | Source: Ambulatory Visit | Attending: Hematology | Admitting: Hematology

## 2017-05-05 DIAGNOSIS — Z170 Estrogen receptor positive status [ER+]: Secondary | ICD-10-CM | POA: Insufficient documentation

## 2017-05-05 DIAGNOSIS — I700 Atherosclerosis of aorta: Secondary | ICD-10-CM | POA: Insufficient documentation

## 2017-05-05 DIAGNOSIS — K469 Unspecified abdominal hernia without obstruction or gangrene: Secondary | ICD-10-CM | POA: Insufficient documentation

## 2017-05-05 DIAGNOSIS — M47816 Spondylosis without myelopathy or radiculopathy, lumbar region: Secondary | ICD-10-CM | POA: Diagnosis not present

## 2017-05-05 DIAGNOSIS — C50411 Malignant neoplasm of upper-outer quadrant of right female breast: Secondary | ICD-10-CM | POA: Diagnosis not present

## 2017-05-05 LAB — GLUCOSE, CAPILLARY: GLUCOSE-CAPILLARY: 106 mg/dL — AB (ref 65–99)

## 2017-05-05 MED ORDER — FLUDEOXYGLUCOSE F - 18 (FDG) INJECTION: 9.8000 | Freq: Once | INTRAVENOUS | Status: DC | PRN

## 2017-05-07 ENCOUNTER — Encounter: Payer: Self-pay | Admitting: Hematology

## 2017-05-07 ENCOUNTER — Ambulatory Visit (HOSPITAL_BASED_OUTPATIENT_CLINIC_OR_DEPARTMENT_OTHER): Payer: BLUE CROSS/BLUE SHIELD | Admitting: Hematology

## 2017-05-07 ENCOUNTER — Telehealth: Payer: Self-pay | Admitting: Hematology

## 2017-05-07 ENCOUNTER — Ambulatory Visit (HOSPITAL_BASED_OUTPATIENT_CLINIC_OR_DEPARTMENT_OTHER): Payer: BLUE CROSS/BLUE SHIELD

## 2017-05-07 VITALS — BP 177/97 | HR 91 | Temp 98.5°F | Resp 18 | Ht 64.0 in | Wt 199.0 lb

## 2017-05-07 DIAGNOSIS — I10 Essential (primary) hypertension: Secondary | ICD-10-CM

## 2017-05-07 DIAGNOSIS — C50411 Malignant neoplasm of upper-outer quadrant of right female breast: Secondary | ICD-10-CM

## 2017-05-07 DIAGNOSIS — G4700 Insomnia, unspecified: Secondary | ICD-10-CM

## 2017-05-07 DIAGNOSIS — Z170 Estrogen receptor positive status [ER+]: Principal | ICD-10-CM

## 2017-05-07 DIAGNOSIS — F419 Anxiety disorder, unspecified: Secondary | ICD-10-CM | POA: Diagnosis not present

## 2017-05-07 DIAGNOSIS — Z79811 Long term (current) use of aromatase inhibitors: Secondary | ICD-10-CM | POA: Diagnosis not present

## 2017-05-07 DIAGNOSIS — F329 Major depressive disorder, single episode, unspecified: Secondary | ICD-10-CM | POA: Diagnosis not present

## 2017-05-07 LAB — COMPREHENSIVE METABOLIC PANEL
ALBUMIN: 3.8 g/dL (ref 3.5–5.0)
ALT: 22 U/L (ref 0–55)
ANION GAP: 9 meq/L (ref 3–11)
AST: 17 U/L (ref 5–34)
Alkaline Phosphatase: 114 U/L (ref 40–150)
BUN: 16 mg/dL (ref 7.0–26.0)
CHLORIDE: 110 meq/L — AB (ref 98–109)
CO2: 25 mEq/L (ref 22–29)
Calcium: 10 mg/dL (ref 8.4–10.4)
Creatinine: 0.8 mg/dL (ref 0.6–1.1)
EGFR: 82 mL/min/{1.73_m2} — AB (ref >90)
GLUCOSE: 93 mg/dL (ref 70–140)
Potassium: 4.3 mEq/L (ref 3.5–5.1)
SODIUM: 144 meq/L (ref 136–145)
Total Bilirubin: 0.27 mg/dL (ref 0.20–1.20)
Total Protein: 7.2 g/dL (ref 6.4–8.3)

## 2017-05-07 LAB — CBC WITH DIFFERENTIAL/PLATELET
BASO%: 0.9 % (ref 0.0–2.0)
BASOS ABS: 0.1 10*3/uL (ref 0.0–0.1)
EOS%: 2.4 % (ref 0.0–7.0)
Eosinophils Absolute: 0.2 10*3/uL (ref 0.0–0.5)
HEMATOCRIT: 41.3 % (ref 34.8–46.6)
HGB: 13.8 g/dL (ref 11.6–15.9)
LYMPH%: 15.7 % (ref 14.0–49.7)
MCH: 29.4 pg (ref 25.1–34.0)
MCHC: 33.5 g/dL (ref 31.5–36.0)
MCV: 87.6 fL (ref 79.5–101.0)
MONO#: 0.5 10*3/uL (ref 0.1–0.9)
MONO%: 5.4 % (ref 0.0–14.0)
NEUT#: 6.8 10*3/uL — ABNORMAL HIGH (ref 1.5–6.5)
NEUT%: 75.6 % (ref 38.4–76.8)
Platelets: 254 10*3/uL (ref 145–400)
RBC: 4.72 10*6/uL (ref 3.70–5.45)
RDW: 13.6 % (ref 11.2–14.5)
WBC: 8.9 10*3/uL (ref 3.9–10.3)
lymph#: 1.4 10*3/uL (ref 0.9–3.3)

## 2017-05-07 MED ORDER — ZOLPIDEM TARTRATE ER 12.5 MG PO TBCR: 12.5000 mg | EXTENDED_RELEASE_TABLET | Freq: Every evening | ORAL | 2 refills | Status: DC | PRN

## 2017-05-07 MED ORDER — ANASTROZOLE 1 MG PO TABS: 1.0000 mg | ORAL_TABLET | Freq: Every day | ORAL | 3 refills | Status: DC

## 2017-05-07 NOTE — Telephone Encounter (Signed)
Gave patient AVS and calender per 5/16 los.  

## 2017-05-07 NOTE — Progress Notes (Signed)
[place]  [phone] [phone]  Clinic Follow up Note   Patient Care Team: Patient, No Pcp Per as PCP - General (General Practice) 05/07/2017  SUMMARY OF ONCOLOGIC HISTORY:  Oncology History   Breast cancer of upper-outer quadrant of right female breast   Staging form: Breast, AJCC 7th Edition     Clinical: Stage IV (T2, N1, M1) - Signed by Truitt Merle, MD on 12/29/2014       Prognostic indicators: ER/PR/HER2NEU +      Pathologic: No stage assigned - Unsigned       Prognostic indicators: ER/PR/HER2NEU +        Breast cancer of upper-outer quadrant of right female breast ([place])   01/03/2014 Initial Diagnosis    Breast cancer of upper-outer quadrant of right female breast, IDA, grade 2, ER 90%+, PR 1%+, her2 (+)      01/20/2014 PET scan    Hypermetabolic mass in the posterior right breast (primary), 2 additional nodules in deep right breast (likely nodes) and 3 hyermetabolic portal nodes (lasrgest 2cm, SUV 13).       02/03/2014 - 05/20/2014 Neo-Adjuvant Chemotherapy    docetaxel, Taxotere, Carboplatin, Herceptin, Perjeta for 6 cycles       06/01/2014 PET scan    Minimal residual hypermetabolic activity within the residual mass inferolaterally in the right breast, likely surgical change. No  other hypermetabolic node or lesion.       06/10/2014 -  Anti-estrogen oral therapy    Anastrozole 1 mg once daily      06/10/2014 - 12/06/2016 Chemotherapy    Maintenance Herceptin every 3 weeks      06/22/2014 Surgery    right mastectomy with negative margines and axillary node dissection. [id number], complete patholigical resposne.       05/10/2015 Imaging    No convincing evidence of hypermetabolic metastatic disease.Right level 2 node is decreased in size and hypermetabolism. Favored to be reactive.      12/03/2016 PET scan    IMPRESSION: 1. No hypermetabolic recurrent or metastatic disease. 2. Increase in anal hypermetabolism, as detailed on  prior exams. 3. Hepatic steatosis. 4. Grossly similar abdominopelvic wall laxity and hernia containing nonobstructive large and small bowel.      05/05/2017 PET scan    PET Scan 05/05/17 IMPRESSION: 1. No recurrent malignancy identified. 2. Stable appearance of anterior abdominal wall laxity and hernias with herniation of much of the large and small bowel. 3.  Aortic Atherosclerosis (ICD10-I70.0). 4. Stable pneumobilia. 5. 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  Lumbar spondylosis       CURRENT THERAPY:  Anastrozole 1 mg once daily  INTERVAL HISTORY: Madeline Davidson returns for follow-up She presents to the clinic today reporting nothing new. Will have mammogram this month. Her appetite is good but her energy is fair. She is going to cut back for work. She wants the next PET scan later, so it will be at the end of the year. She does not exercise but she does do housework. Overall she is doing well.    REVIEW OF SYSTEMS:   Constitutional: Denies fevers, chills or abnormal weight loss (+) Energy is fair Eyes: Denies blurriness of vision Ears, nose, mouth, throat, and face: Denies mucositis or sore throat Respiratory: Denies cough, dyspnea or wheezes Cardiovascular: Denies palpitation, chest discomfort or lower extremity swelling Gastrointestinal:  Denies nausea, heartburn or change in bowel habits  Skin: Denies abnormal skin rashes Lymphatics: Denies new lymphadenopathy or easy bruising Neurological:Denies numbness, tingling  or new weaknesses Behavioral/Psych: Mood is stable, no new changes (+)one time  depression All other systems were reviewed with the patient and are negative.  MEDICAL HISTORY:  Past Medical History:  Diagnosis Date  . Allergy   . Anemia ~ 04/2014  . Anxiety   . Arthritis    back  . Breast cancer ([place]) 12/16/13    Invasive ductal Carcinoma; 1/1 nodes positive / self palpated  . Diverticulitis    history of  . History of blood transfusion ~ 04/2014   "related to  chemo"  . Inguinal hernia    Bil  . PONV (postoperative nausea and vomiting)    Pt denies  . Septic shock ([place])    from chemo in 2015  . Wears glasses     SURGICAL HISTORY: Past Surgical History:  Procedure Laterality Date  . AXILLARY LYMPH NODE BIOPSY Right 12/2013  . BREAST BIOPSY Right 12/2013  . CHOLECYSTECTOMY  2005  . COLON RESECTION  2004   "for abscess on my colon"  . HERNIA REPAIR  2005   umb  . MASTECTOMY W/ SENTINEL NODE BIOPSY Right 06/22/2014   Procedure: RIGHT TOTAL MASTECTOMY WITH SENTINEL LYMPH NODE BIOPSY;  Surgeon: Rolm Bookbinder, MD;  Location: [facility];  Service: General;  Laterality: Right;  . PORTACATH PLACEMENT Left 01/31/2014   Procedure: INSERTION PORT-A-CATH;  Surgeon: Rolm Bookbinder, MD;  Location: [facility];  Service: General;  Laterality: Left;    I have reviewed the social history and family history with the patient and they are unchanged from previous note.  ALLERGIES:  is allergic to ciprofloxacin.  MEDICATIONS:  Current Outpatient Prescriptions  Medication Sig Dispense Refill  . acetaminophen (TYLENOL) 500 MG tablet Take 1,000 mg by mouth every 6 (six) hours as needed for mild pain.    Marland Kitchen anastrozole (ARIMIDEX) 1 MG tablet Take 1 tablet (1 mg total) by mouth daily. 90 tablet 3  . cetirizine (ZYRTEC) 10 MG tablet Take 10 mg by mouth daily.     . clonazePAM (KLONOPIN) 0.5 MG tablet Take 1 tablet (0.5 mg total) by mouth daily as needed for anxiety. 30 tablet 0  . Cyanocobalamin (VITAMIN B 12 PO) Take 1,000 mcg by mouth daily.    Marland Kitchen ibuprofen (ADVIL,MOTRIN) 800 MG tablet Take 1 tablet (800 mg total) by mouth every 8 (eight) hours as needed for moderate pain. 90 tablet 2  . lidocaine-prilocaine (EMLA) cream APPLY EXTERNALLY TO AFFECTED AREA(S) AS NEEDED 30 g 1  . lisinopril (PRINIVIL,ZESTRIL) 10 MG tablet TAKE 1 TABLET AT BEDTIME 90 tablet 3  . zolpidem (AMBIEN CR) 12.5 MG CR tablet Take 1 tablet (12.5 mg total) by mouth at bedtime as needed. for sleep 30  tablet 2   No current facility-administered medications for this visit.    Facility-Administered Medications Ordered in Other Visits  Medication Dose Route Frequency Provider Last Rate Last Dose  . fludeoxyglucose F - 18 (FDG) injection 9.8 millicurie  9.8 millicurie Intravenous Once PRN [place], Lennette Bihari, MD      . sodium chloride 0.9 % injection 10 mL  10 mL Intracatheter PRN Truitt Merle, MD   10 mL at 09/15/15 1129    PHYSICAL EXAMINATION: ECOG PERFORMANCE STATUS: 1  Vitals:   05/07/17 1418  BP: (!) 177/97  Pulse: 91  Resp: 18  Temp: 98.5 F (36.9 C)   Filed Weights   05/07/17 1418  Weight: 199 lb (90.3 kg)    GENERAL:alert, no distress and comfortable SKIN: skin color, texture, turgor are  normal, no rashes or significant lesions(+) Diffuse small  Papular skin rash on her forearms, no blister, no skin erythema. OROPHARYNX:no exudate, no erythema and lips, buccal mucosa, and tongue normal  NECK: supple, thyroid normal size, non-tender, without nodularity LYMPH:  no palpable lymphadenopathy in the cervical, axillary or inguinal LUNGS: clear to auscultation and percussion with normal breathing effort HEART: regular rate & rhythm and no murmurs and no lower extremity edema ABDOMEN:abdomen soft, non-tender and normal bowel sounds, no organomegaly, (+) abdominal wall hernia in the right lower quadrant.  Musculoskeletal:no cyanosis of digits and no clubbing  NEURO: alert & oriented x 3 with fluent speech, no focal motor/sensory deficits Right breast surgically absent,  no palpable lesions on chest wall or axilla note. Surgical scar is well healed. Left breast exam no palpable mass, skin change or nipple discharge.  LABORATORY DATA:  I have reviewed the data as listed CBC Latest Ref Rng & Units 05/07/2017 12/06/2016 11/08/2016  WBC 3.9 - 10.3 10e3/uL 8.9 8.3 7.5  Hemoglobin 11.6 - 15.9 g/dL 13.8 13.3 13.3  Hematocrit 34.8 - 46.6 % 41.3 40.6 41.1  Platelets 145 - 400 10e3/uL 254 271  252     CMP Latest Ref Rng & Units 05/07/2017 12/06/2016 11/08/2016  Glucose 70 - 140 mg/dl 93 96 108  BUN 7.0 - 26.0 mg/dL 16.0 19.4 15.9  Creatinine 0.6 - 1.1 mg/dL 0.8 0.9 0.8  Sodium 136 - 145 mEq/L 144 146(H) 144  Potassium 3.5 - 5.1 mEq/L 4.3 4.2 4.2  Chloride 96 - 112 mEq/L - - -  CO2 22 - 29 mEq/L '25 27 27  '$ Calcium 8.4 - 10.4 mg/dL 10.0 10.1 9.9  Total Protein 6.4 - 8.3 g/dL 7.2 7.5 7.4  Total Bilirubin 0.20 - 1.20 mg/dL 0.27 0.26 0.44  Alkaline Phos 40 - 150 U/L 114 117 130  AST 5 - 34 U/L '17 21 20  '$ ALT 0 - 55 U/L '22 22 21      '$ RADIOGRAPHIC STUDIES: I have personally reviewed the radiological images as listed and agreed with the findings in the report.   PET Scan 05/05/17 IMPRESSION: 1. No recurrent malignancy identified. 2. Stable appearance of anterior abdominal wall laxity and hernias with herniation of much of the large and small bowel. 3.  Aortic Atherosclerosis (ICD10-I70.0). 4. Stable pneumobilia. 5. 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  Lumbar spondylosis  PET/CT on 12/03/2016 IMPRESSION: 1. No hypermetabolic recurrent or metastatic disease. 2. Increase in anal hypermetabolism, as detailed on prior exams. 3. Hepatic steatosis. 4. Grossly similar abdominopelvic wall laxity and hernia containing nonobstructive large and small bowel.   Echo 03/18/2016 Study Conclusions  - Left ventricle: The cavity size was normal. Wall thickness was   normal. Systolic function was normal. The estimated ejection   fraction was in the range of 60% to 65%. Wall motion was normal;   there were no regional wall motion abnormalities. Doppler   parameters are consistent with abnormal left ventricular   relaxation (grade 1 diastolic dysfunction). - Impressions: GLS -18.2% (underestimated), Lateral s&' 10.9 cm/s.  Impressions:  - GLS -18.2% (underestimated), Lateral s&' 10.9 cm/s.   ASSESSMENT AND PLAN 61 y.o.  postmenopausal woman, was found to have a 3.3cm  right breast mass and a positive axillary lymph nodes, biopsy confirmed invasive ductal carcinoma, ER positive PR positive HER-2 positive.  Her initial PET scan also reviewed 3 hypermetabolic nodes in periportal, measuring up to 2 cm with SUV 14. The periportal node was not bopsied, but  likely represented metastatic nodes. She had completed response to neoadjuvant chemotherapy and underwent right mastectomy.  1. Breast cancer of upper outer quadrant of right female breast, G2 IDC, with presumed metastasis to to periportal nodes, ER+/PR+/HER2+ -Her case was reviewed in our tumor board previously. Her initial and post treatment PET scan was reviewed again. Due to the size and metabolic activity of those periportal lymph nodes at diagnosis, she likely has metastatic breast cancer, but she did not have biopsy to confirm it.  -She was on Herceptin for almost 3 yearsrs, I stopped Herceptin after multiple PET scan of NED, and continue her anastrozole indefinitely.  -her last echo showed normal EF in 07/2016 she follows up with his cardiologist Dr. Haroldine Laws  - her recent left breat mammogram was normal - she is clinically doing well,  No clinical concern for disease progression.I encouraged her to continue exercise and remain to be active. -I reviewed her lab results with her today, both CBC and CMP are within normal limits -I reviewed her recent surveillance PET scan from 05/05/2017, which is negative for metastatic disease. -will continues surveillance PET scan every 6 months for a few years -She will get her next mammogram this month and will redo PET at the end of the year  -will repeat lab today   2. Hypermetabolic uptake at anus -She has persistent hypermetabolic uptake at the anus from the past 4 PET scans -She is clinically asymptomatic, no pain, hematochezia or constipation. -rectal exam was negative for mass or bleeding. -She has not had colonoscopy. I have repeated encouraged her to consider a  colonoscopy, she is agreeable, but has not called for appointment. she wants to get it done in [place].   3. Anxiety and depression, insomnia  -Continue clonazepam as needed -She tried zoloft and lexapro but could not tolerated.  -She is still on Ambien for insomnia, we discussed weaning her off Ambien, I encouraged her to establish her care with a primary care physician, and consider switch ambien to other medication for insomnia   4. Vit D deficiency and bone health  -Her vitamin D level was 10 on 03/10/2015 -She received Vit D 50,000u weekly x6 weeks then 1000u daily  -I strongly encouraged her to continue taking calcium -Her DEXA scan showed normal bone density in 09/2015  -I discussed that anastrozole will likely weak her bone, and reviewed the treatment option for osteoporosis. She voiced good understanding.   Plan -PET scan reviewed, no evidence of disease.  -Continue anastrozole  -Mammogram this month  -labs today  -labs and F/u in 3 months   All questions were answered. The patient knows to call the clinic with any problems, questions or concerns. No barriers to learning was detected.  I spent 20 minutes counseling the patient face to face. The total time spent in the appointment was 25 minutes and more than 50% was on counseling and review of test results  This document serves as a record of services personally performed by Truitt Merle, MD. It was created on her behalf by Joslyn Devon, a trained medical scribe. The creation of this record is based on the scribe's personal observations and the provider's statements to them. This document has been checked and approved by the attending provider.   Truitt Merle, MD 05/07/2017

## 2017-05-08 ENCOUNTER — Telehealth: Payer: Self-pay | Admitting: *Deleted

## 2017-05-08 LAB — CANCER ANTIGEN 27.29: CAN 27.29: 8.5 U/mL (ref 0.0–38.6)

## 2017-05-08 NOTE — Progress Notes (Signed)
Left message to call back for result

## 2017-05-08 NOTE — Telephone Encounter (Signed)
-----   Message from Truitt Merle, MD sent at 05/08/2017  7:20 AM EDT ----- Please let pt know the normal lab results. Thanks   Thanks  Truitt Merle  05/08/2017

## 2017-05-08 NOTE — Telephone Encounter (Signed)
Left message on pt's mobile # to call back for lab results.

## 2017-05-08 NOTE — Telephone Encounter (Signed)
Results given to pt

## 2017-05-08 NOTE — Telephone Encounter (Signed)
Spoke with pt and informed pt of normal lab results as per Dr. Feng's instructions.  Pt voiced understanding.  

## 2017-08-01 ENCOUNTER — Encounter: Payer: Self-pay | Admitting: Hematology

## 2017-08-01 NOTE — Progress Notes (Signed)
[facility]  [phone]) [phone] Fax:([phone]) [phone]  Clinic Follow up Note   Patient Care Team: Patient, No Pcp Per as PCP - General (General Practice) 08/08/2017  SUMMARY OF ONCOLOGIC HISTORY:  Oncology History   Breast cancer of upper-outer quadrant of right female breast   Staging form: Breast, AJCC 7th Edition     Clinical: Stage IV (T2, N1, M1) - Signed by Truitt Merle, MD on 12/29/2014       Prognostic indicators: ER/PR/HER2NEU +      Pathologic: No stage assigned - Unsigned       Prognostic indicators: ER/PR/HER2NEU +        Breast cancer of upper-outer quadrant of right female breast ([place])   01/03/2014 Initial Diagnosis    Breast cancer of upper-outer quadrant of right female breast, IDA, grade 2, ER 90%+, PR 1%+, her2 (+)      01/20/2014 PET scan    Hypermetabolic mass in the posterior right breast (primary), 2 additional nodules in deep right breast (likely nodes) and 3 hyermetabolic portal nodes (lasrgest 2cm, SUV 13).       02/03/2014 - 05/20/2014 Neo-Adjuvant Chemotherapy    docetaxel, Taxotere, Carboplatin, Herceptin, Perjeta for 6 cycles       06/01/2014 PET scan    Minimal residual hypermetabolic activity within the residual mass inferolaterally in the right breast, likely surgical change. No  other hypermetabolic node or lesion.       06/10/2014 -  Anti-estrogen oral therapy    Anastrozole 1 mg once daily      06/10/2014 - 12/06/2016 Chemotherapy    Maintenance Herceptin every 3 weeks      06/22/2014 Surgery    right mastectomy with negative margines and axillary node dissection. [id number], complete patholigical resposne.       05/10/2015 Imaging    No convincing evidence of hypermetabolic metastatic disease.Right level 2 node is decreased in size and hypermetabolism. Favored to be reactive.      12/03/2016 PET scan    IMPRESSION: 1. No hypermetabolic recurrent or metastatic disease. 2. Increase in anal hypermetabolism, as detailed on  prior exams. 3. Hepatic steatosis. 4. Grossly similar abdominopelvic wall laxity and hernia containing nonobstructive large and small bowel.      05/05/2017 PET scan    PET Scan 05/05/17 IMPRESSION: 1. No recurrent malignancy identified. 2. Stable appearance of anterior abdominal wall laxity and hernias with herniation of much of the large and small bowel. 3.  Aortic Atherosclerosis (ICD10-I70.0). 4. Stable pneumobilia. 5. 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  Lumbar spondylosis      CURRENT THERAPY:  Anastrozole 1 mg once daily.   INTERVAL HISTORY: Madeline Davidson returns for follow-up. Overall she is doing well. Pt reports that she had her mammogram Friday and that this went well. She has not had any recent changes since her last visit otherwise. Pt notes that she has had a flare up of her allergies recently, she is currently taking OTC medications to remedy this at home. She also is scheduled to undergo dental surgery in the near future. Additionally, she is also requesting a refill on her Ambien for sleep her nightly sleep aid. She has been taking this nightly; however, she states that she has been taking less of this at home and attempting to wean herself off of this.  On review of system, pt denies abdominal pain, cough, pain otherwise. She reports some increase in her seasonal allergies.    REVIEW OF SYSTEMS:   Constitutional: Denies  fevers, chills or abnormal weight loss. No generalized pain.  Eyes: Denies blurriness of vision Ears, nose, mouth, throat, and face: Denies mucositis or sore throat Respiratory: Denies cough, dyspnea or wheezes Cardiovascular: Denies palpitation, chest discomfort or lower extremity swelling Gastrointestinal:  Denies nausea, heartburn or change in bowel habits. Denies abdominal pain Skin: Denies abnormal skin rashes Immunological: (+) Seasonal allergies Lymphatics: Denies new lymphadenopathy or easy bruising Neurological:Denies numbness, tingling or new  weaknesses Behavioral/Psych: Mood is stable, no new changes  All other systems were reviewed with the patient and are negative.  MEDICAL HISTORY:  Past Medical History:  Diagnosis Date  . Allergy   . Anemia ~ 04/2014  . Anxiety   . Arthritis    back  . Breast cancer ([place]) 12/16/13    Invasive ductal Carcinoma; 1/1 nodes positive / self palpated  . Diverticulitis    history of  . History of blood transfusion ~ 04/2014   "related to chemo"  . Inguinal hernia    Bil  . PONV (postoperative nausea and vomiting)    Pt denies  . Septic shock ([place])    from chemo in 2015  . Wears glasses    SURGICAL HISTORY: Past Surgical History:  Procedure Laterality Date  . AXILLARY LYMPH NODE BIOPSY Right 12/2013  . BREAST BIOPSY Right 12/2013  . CHOLECYSTECTOMY  2005  . COLON RESECTION  2004   "for abscess on my colon"  . HERNIA REPAIR  2005   umb  . MASTECTOMY W/ SENTINEL NODE BIOPSY Right 06/22/2014   Procedure: RIGHT TOTAL MASTECTOMY WITH SENTINEL LYMPH NODE BIOPSY;  Surgeon: Rolm Bookbinder, MD;  Location: [facility];  Service: General;  Laterality: Right;  . PORTACATH PLACEMENT Left 01/31/2014   Procedure: INSERTION PORT-A-CATH;  Surgeon: Rolm Bookbinder, MD;  Location: [facility];  Service: General;  Laterality: Left;   I have reviewed the social history and family history with the patient and they are unchanged from previous note.  ALLERGIES:  is allergic to ciprofloxacin.  MEDICATIONS:  Current Outpatient Prescriptions  Medication Sig Dispense Refill  . acetaminophen (TYLENOL) 500 MG tablet Take 1,000 mg by mouth every 6 (six) hours as needed for mild pain.    Marland Kitchen anastrozole (ARIMIDEX) 1 MG tablet Take 1 tablet (1 mg total) by mouth daily. 90 tablet 3  . cetirizine (ZYRTEC) 10 MG tablet Take 10 mg by mouth daily.     . clonazePAM (KLONOPIN) 0.5 MG tablet Take 1 tablet (0.5 mg total) by mouth daily as needed for anxiety. 30 tablet 0  . Cyanocobalamin (VITAMIN B 12 PO) Take 1,000 mcg by mouth  daily.    Marland Kitchen ibuprofen (ADVIL,MOTRIN) 800 MG tablet Take 1 tablet (800 mg total) by mouth every 8 (eight) hours as needed for moderate pain. 90 tablet 2  . lidocaine-prilocaine (EMLA) cream APPLY EXTERNALLY TO AFFECTED AREA(S) AS NEEDED 30 g 1  . lisinopril (PRINIVIL,ZESTRIL) 10 MG tablet TAKE 1 TABLET AT BEDTIME 90 tablet 3  . zolpidem (AMBIEN) 5 MG tablet Take 1 tablet (5 mg total) by mouth at bedtime as needed for sleep. 30 tablet 2   Current Facility-Administered Medications  Medication Dose Route Frequency Provider Last Rate Last Dose  . sodium chloride flush (NS) 0.9 % injection 10 mL  10 mL Intravenous PRN Truitt Merle, MD   10 mL at 08/08/17 0941   Facility-Administered Medications Ordered in Other Visits  Medication Dose Route Frequency Provider Last Rate Last Dose  . sodium chloride 0.9 % injection 10 mL  10 mL Intracatheter PRN Truitt Merle, MD   10 mL at 09/15/15 1129    PHYSICAL EXAMINATION:  ECOG PERFORMANCE STATUS: 1  Vitals:   08/08/17 0837  BP: (!) 150/91  Pulse: 78  Resp: 20  Temp: 98.7 F (37.1 C)  SpO2: 100%   Filed Weights   08/08/17 0837  Weight: 196 lb 15.4 oz (89.3 kg)    GENERAL:alert, no distress and comfortable SKIN: skin color, texture, turgor are normal, no rashes or significant lesions. OROPHARYNX:no exudate, no erythema and lips, buccal mucosa, and tongue normal  NECK: supple, thyroid normal size, non-tender, without nodularity LYMPH:  no palpable lymphadenopathy in the cervical, axillary or inguinal LUNGS: clear to auscultation and percussion with normal breathing effort HEART: regular rate & rhythm and no murmurs and no lower extremity edema ABDOMEN:abdomen soft, non-tender and normal bowel sounds, no organomegaly, (+) abdominal wall hernia in the right lower quadrant.  Musculoskeletal:no cyanosis of digits and no clubbing  NEURO: alert & oriented x 3 with fluent speech, no focal motor/sensory deficits Right breast surgically absent,  no palpable  lesions on chest wall or axilla note. Surgical scar is well healed. Left breast exam no palpable mass, skin change or nipple discharge.  LABORATORY DATA:  I have reviewed the data as listed CBC Latest Ref Rng & Units 08/08/2017 05/07/2017 12/06/2016  WBC 3.9 - 10.3 10e3/uL 7.8 8.9 8.3  Hemoglobin 11.6 - 15.9 g/dL 13.9 13.8 13.3  Hematocrit 34.8 - 46.6 % 42.3 41.3 40.6  Platelets 145 - 400 10e3/uL 253 254 271   CMP Latest Ref Rng & Units 08/08/2017 05/07/2017 12/06/2016  Glucose 70 - 140 mg/dl 97 93 96  BUN 7.0 - 26.0 mg/dL 17.8 16.0 19.4  Creatinine 0.6 - 1.1 mg/dL 0.8 0.8 0.9  Sodium 136 - 145 mEq/L 143 144 146(H)  Potassium 3.5 - 5.1 mEq/L 4.1 4.3 4.2  Chloride 96 - 112 mEq/L - - -  CO2 22 - 29 mEq/L '27 25 27  '$ Calcium 8.4 - 10.4 mg/dL 10.0 10.0 10.1  Total Protein 6.4 - 8.3 g/dL 7.1 7.2 7.5  Total Bilirubin 0.20 - 1.20 mg/dL 0.41 0.27 0.26  Alkaline Phos 40 - 150 U/L 115 114 117  AST 5 - 34 U/L '25 17 21  '$ ALT 0 - 55 U/L '31 22 22   '$ RADIOGRAPHIC STUDIES: I have personally reviewed the radiological images as listed and agreed with the findings in the report.  PET Scan 05/05/17 IMPRESSION: 1. No recurrent malignancy identified. 2. Stable appearance of anterior abdominal wall laxity and hernias with herniation of much of the large and small bowel. 3.  Aortic Atherosclerosis (ICD10-I70.0). 4. Stable pneumobilia. 5. 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  Lumbar spondylosis  PET/CT on 12/03/2016 IMPRESSION: 1. No hypermetabolic recurrent or metastatic disease. 2. Increase in anal hypermetabolism, as detailed on prior exams. 3. Hepatic steatosis. 4. Grossly similar abdominopelvic wall laxity and hernia containing nonobstructive large and small bowel.  Echo 03/18/2016 Study Conclusions  - Left ventricle: The cavity size was normal. Wall thickness was   normal. Systolic function was normal. The estimated ejection   fraction was in the range of 60% to 65%. Wall motion was normal;    there were no regional wall motion abnormalities. Doppler  parameters are consistent with abnormal left ventricular relaxation (grade 1 diastolic dysfunction). - Impressions: GLS -18.2% (underestimated), Lateral s&' 10.9 cm/s.  Impressions: - GLS -18.2% (underestimated), Lateral s&' 10.9 cm/s.   ASSESSMENT AND PLAN 61 y.o.  postmenopausal  woman, was found to have a 3.3cm right breast mass and a positive axillary lymph nodes, biopsy confirmed invasive ductal carcinoma, ER positive PR positive HER-2 positive.  Her initial PET scan also reviewed 3 hypermetabolic nodes in periportal, measuring up to 2 cm with SUV 14. The periportal node was not bopsied, but likely represented metastatic nodes. She had completed response to neoadjuvant chemotherapy and underwent right mastectomy.  1. Breast cancer of upper outer quadrant of right female breast, G2 IDC, with presumed metastasis to to periportal nodes, ER+/PR+/HER2+ -Her case was reviewed in our tumor board previously. Her initial and post treatment PET scan was reviewed again. Due to the size and metabolic activity of those periportal lymph nodes at diagnosis, she likely has metastatic breast cancer, but she did not have biopsy to confirm it.  -She was on Herceptin for almost 3 yearsrs, I stopped Herceptin after multiple PET scan of NED, and continue her anastrozole indefinitely.  -her last echo showed normal EF in 07/2016 she follows up with his cardiologist Dr. Haroldine Laws  - her recent left breat mammogram was normal - she is clinically doing well,  No clinical concern for disease progression.I encouraged her to continue exercise and remain to be active. -I reviewed her lab results with her today, both CBC and CMP are within normal limits -I reviewed her recent surveillance PET scan from 05/05/2017, which is negative for metastatic disease. -We discussed her CBC results which have all been normal today (08/08/17); we will discuss remaining pending lab  results at next visit. Additionally, we discussed that we will have her f/u scan at the end of this year. We then will have her f/u for scans yearly following this.  -I recommend the patient to have a PET scan in 3 months, her last scan was 05/05/17, she states that she would like to have this towards the end of the year d/t her insurance coverage.   -Pt want to keep her port, will flush today and every 6 weeks.   2. Hypermetabolic uptake at anus -She has persistent hypermetabolic uptake at the anus from the past 4 PET scans -She is clinically asymptomatic, no pain, hematochezia or constipation. -rectal exam was negative for mass or bleeding. -She has not had colonoscopy. I have previously encouraged her to consider a colonoscopy repeatly and she has not scheduled. -I encouraged her again today (08/08/17) to have a colonoscopy, but she is very reluctant to have one due to her difficulty to prep for colonoscopy. She is open for sigmoidoscopy at GI office, she does not want to return to her local GI, and I will refer her to [facility]. She is agreeable with this plan and I will refer her for this. Will send a message to Gi today for close f/u in the near future.    3. Anxiety and depression, insomnia  -Continue clonazepam as needed -She tried zoloft and lexapro but could not tolerate.  -She is still on Ambien for insomnia, we discussed weaning her off Ambien, I encouraged her to establish her care with a primary care physician, and consider switch ambien to other medication for insomnia.  -We will refill her Ambien today and reduce this to '5mg'$  (08/08/17). Discussed that she could cut this in half at home PRN. Encouraged her to begin taking this every other night to reduce her usage.  4. Vitamin D deficiency and bone health  -Her vitamin D level was 10 on 03/10/2015 -She received Vit D 50,000u weekly x6 weeks  then 1000u daily  -I previously strongly encouraged her to continue taking calcium -Her  DEXA scan showed normal bone density in 09/2015.  -I discussed that anastrozole will likely weaken her bones, and reviewed the treatment option for osteoporosis. She voiced good understanding. -I encouraged the to have her DEXA scan by the end of this year, pt would like to have this ordered towards the end of the year d/t her insurance coverage and would also like this ordered near her home in [place].   5.) Hypertension  -Her BP is mildly elevated today (08/08/17). Encouraged her to f/u w/ her PCP for this issue for ongoing management and continue taking her rx'd anti-hypertensives.   Plan -Labs reviewed; stable.  -continue letrozole. -F/u in 3 months  -Lab, flush, and PET a few days before office visit  -Port flush today and 6 week -will refer her to low bowel GI for sigmoidoscopy or colonoscopy    All questions were answered. The patient knows to call the clinic with any problems, questions or concerns. No barriers to learning was detected.  I spent 25 minutes counseling the patient face to face. The total time spent in the appointment was 30 minutes and more than 50% was on counseling and review of test results  This document serves as a record of services personally performed by Truitt Merle, MD. It was created on her behalf by Reola Mosher, a trained medical scribe. The creation of this record is based on the scribe's personal observations and the provider's statements to them.  This document has been checked and approved by the attending provider.   Truitt Merle 08/08/2017

## 2017-08-04 ENCOUNTER — Telehealth: Payer: Self-pay | Admitting: *Deleted

## 2017-08-04 NOTE — Telephone Encounter (Signed)
Received faxed results of mammogram from [facility].   Copy left on md's desk for review.

## 2017-08-08 ENCOUNTER — Ambulatory Visit (HOSPITAL_BASED_OUTPATIENT_CLINIC_OR_DEPARTMENT_OTHER): Payer: BLUE CROSS/BLUE SHIELD | Admitting: Hematology

## 2017-08-08 ENCOUNTER — Other Ambulatory Visit (HOSPITAL_BASED_OUTPATIENT_CLINIC_OR_DEPARTMENT_OTHER): Payer: BLUE CROSS/BLUE SHIELD

## 2017-08-08 ENCOUNTER — Encounter: Payer: Self-pay | Admitting: Hematology

## 2017-08-08 ENCOUNTER — Telehealth: Payer: Self-pay | Admitting: Hematology

## 2017-08-08 VITALS — BP 150/91 | HR 78 | Temp 98.7°F | Resp 20 | Ht 64.0 in | Wt 197.0 lb

## 2017-08-08 DIAGNOSIS — F329 Major depressive disorder, single episode, unspecified: Secondary | ICD-10-CM

## 2017-08-08 DIAGNOSIS — Z170 Estrogen receptor positive status [ER+]: Secondary | ICD-10-CM | POA: Diagnosis not present

## 2017-08-08 DIAGNOSIS — Z79811 Long term (current) use of aromatase inhibitors: Secondary | ICD-10-CM | POA: Diagnosis not present

## 2017-08-08 DIAGNOSIS — F419 Anxiety disorder, unspecified: Secondary | ICD-10-CM | POA: Diagnosis not present

## 2017-08-08 DIAGNOSIS — I10 Essential (primary) hypertension: Secondary | ICD-10-CM

## 2017-08-08 DIAGNOSIS — C50411 Malignant neoplasm of upper-outer quadrant of right female breast: Secondary | ICD-10-CM

## 2017-08-08 LAB — CBC WITH DIFFERENTIAL/PLATELET
BASO%: 0.8 % (ref 0.0–2.0)
Basophils Absolute: 0.1 10*3/uL (ref 0.0–0.1)
EOS%: 1.9 % (ref 0.0–7.0)
Eosinophils Absolute: 0.2 10*3/uL (ref 0.0–0.5)
HCT: 42.3 % (ref 34.8–46.6)
HGB: 13.9 g/dL (ref 11.6–15.9)
LYMPH%: 16.1 % (ref 14.0–49.7)
MCH: 29.2 pg (ref 25.1–34.0)
MCHC: 33 g/dL (ref 31.5–36.0)
MCV: 88.5 fL (ref 79.5–101.0)
MONO#: 0.5 10*3/uL (ref 0.1–0.9)
MONO%: 6.7 % (ref 0.0–14.0)
NEUT#: 5.8 10*3/uL (ref 1.5–6.5)
NEUT%: 74.5 % (ref 38.4–76.8)
Platelets: 253 10*3/uL (ref 145–400)
RBC: 4.78 10*6/uL (ref 3.70–5.45)
RDW: 13.4 % (ref 11.2–14.5)
WBC: 7.8 10*3/uL (ref 3.9–10.3)
lymph#: 1.3 10*3/uL (ref 0.9–3.3)

## 2017-08-08 LAB — COMPREHENSIVE METABOLIC PANEL
ALT: 31 U/L (ref 0–55)
AST: 25 U/L (ref 5–34)
Albumin: 3.7 g/dL (ref 3.5–5.0)
Alkaline Phosphatase: 115 U/L (ref 40–150)
Anion Gap: 10 mEq/L (ref 3–11)
BUN: 17.8 mg/dL (ref 7.0–26.0)
CHLORIDE: 107 meq/L (ref 98–109)
CO2: 27 meq/L (ref 22–29)
CREATININE: 0.8 mg/dL (ref 0.6–1.1)
Calcium: 10 mg/dL (ref 8.4–10.4)
EGFR: 76 mL/min/{1.73_m2} — ABNORMAL LOW (ref >90)
Glucose: 97 mg/dl (ref 70–140)
Potassium: 4.1 mEq/L (ref 3.5–5.1)
Sodium: 143 mEq/L (ref 136–145)
Total Bilirubin: 0.41 mg/dL (ref 0.20–1.20)
Total Protein: 7.1 g/dL (ref 6.4–8.3)

## 2017-08-08 MED ORDER — HEPARIN SOD (PORK) LOCK FLUSH 100 UNIT/ML IV SOLN
500.0000 [IU] | Freq: Once | INTRAVENOUS | Status: AC
  Administered 2017-08-08: 500 [IU] via INTRAVENOUS
  Filled 2017-08-08: qty 5

## 2017-08-08 MED ORDER — SODIUM CHLORIDE 0.9% FLUSH
10.0000 mL | INTRAVENOUS | Status: DC | PRN
  Administered 2017-08-08: 10 mL via INTRAVENOUS
  Filled 2017-08-08: qty 10

## 2017-08-08 MED ORDER — ZOLPIDEM TARTRATE 5 MG PO TABS: 5.0000 mg | ORAL_TABLET | Freq: Every evening | ORAL | 2 refills | Status: DC | PRN

## 2017-08-08 NOTE — Telephone Encounter (Signed)
Scheduled appt per 8/17 los - Gave patient AVS and calender per los. Central radiology to contact patient with PET schedule.

## 2017-08-09 LAB — CANCER ANTIGEN 27.29: CA 27.29: 5.5 U/mL (ref 0.0–38.6)

## 2017-08-11 ENCOUNTER — Telehealth: Payer: Self-pay

## 2017-08-11 NOTE — Telephone Encounter (Signed)
No, that is not soon enough. Can you put her on with extender in next 2-3 weeks instead.  Put on my office waitlist as well. thanks

## 2017-08-11 NOTE — Telephone Encounter (Signed)
10/21/17 AT 2:15 PM FOLLOW UP with Dr Ardis Hughs.  Dr Ardis Hughs this is your first available.  Is this ok?

## 2017-08-11 NOTE — Telephone Encounter (Signed)
-----   Message from Milus Banister, MD sent at 08/10/2017  6:30 AM EDT ----- Happy to, we'll get her in.  Svetlana Bagby, She needs my next available ngi appt for abnormal PET scan.  Thanks  DJ ----- Message ----- From: Truitt Merle, MD Sent: 08/08/2017   9:07 AM To: Milus Banister, MD  Hi Dan,  This pt has persistent hypermetabolic activity at rectal/anal area on PET which was ordered for her breast cancer,  although my rectal exam was negative. She has repeated declined colonoscopy but oepn to sigmoidoscopy. Could you see her? She does not want to go back to her GI in [place].  Thanks much,   Krista Blue

## 2017-08-11 NOTE — Telephone Encounter (Signed)
The pt has been scheduled and message left to advise pt

## 2017-08-11 NOTE — Telephone Encounter (Signed)
The pt has been scheduled to see Ellouise Newer on 8/31 at 1:15 pm Left message on machine to call back

## 2017-08-12 NOTE — Telephone Encounter (Signed)
Messages left at all available numbers.  I called the work number and the pt is not at work today.

## 2017-08-12 NOTE — Telephone Encounter (Signed)
Appointment information sent to the patient via My chart.  I have been unable to reach her by phone.  Letter was also mailed

## 2017-08-22 ENCOUNTER — Ambulatory Visit: Payer: BLUE CROSS/BLUE SHIELD | Admitting: Physician Assistant

## 2017-08-22 ENCOUNTER — Encounter: Payer: Self-pay | Admitting: Gastroenterology

## 2017-08-22 ENCOUNTER — Ambulatory Visit (INDEPENDENT_AMBULATORY_CARE_PROVIDER_SITE_OTHER): Payer: BLUE CROSS/BLUE SHIELD | Admitting: Physician Assistant

## 2017-08-22 ENCOUNTER — Encounter: Payer: Self-pay | Admitting: Physician Assistant

## 2017-08-22 VITALS — BP 132/78 | HR 88 | Ht 63.0 in | Wt 196.5 lb

## 2017-08-22 DIAGNOSIS — Z800 Family history of malignant neoplasm of digestive organs: Secondary | ICD-10-CM | POA: Diagnosis not present

## 2017-08-22 DIAGNOSIS — R948 Abnormal results of function studies of other organs and systems: Secondary | ICD-10-CM | POA: Diagnosis not present

## 2017-08-22 MED ORDER — NA SULFATE-K SULFATE-MG SULF 17.5-3.13-1.6 GM/177ML PO SOLN: 1.0000 | ORAL | 0 refills | Status: DC

## 2017-08-22 MED ORDER — METOCLOPRAMIDE HCL 5 MG PO TABS: 5.0000 mg | ORAL_TABLET | ORAL | 0 refills | Status: DC

## 2017-08-22 NOTE — Patient Instructions (Signed)

## 2017-08-22 NOTE — Progress Notes (Signed)
Chief Complaint: Abnormal PET scan, family history of colon cancer  HPI:  Madeline Davidson is a 61 year old Caucasian female with past medical history of breast cancer and multiple others listed below including large ventral hernia containing large and small bowel, who presents to clinic today as a referral from Dr. Burr Medico her oncologist, for abnormal PET scan of the rectum.      Most recent PET scan done after patient's treatment for breast cancer on 05/05/17 continued to show accentuated left posterior anal activity. This also showed an anterior abdominal wall laxity, supraumbilical hernia and right lateral abdominal wall hernia with herniation of most of the large and small bowel in the lower right lateral abdominal wall hernia.   Today, the patient presents to clinic and tells me that she has never had a colonoscopy. Her father was diagnosed with colon cancer at the age of 64. The patient has previously declined this procedure as she lives in [place] and is afraid that she will have an accident on her way to the clinic due to the split prep dosing.   Past medical history is also positive for previous colorectal surgery, patient tells me she had abscessed diverticula at one time and had a bout a "foot of my colon removed". Apparently patient has been following with [facility] regarding possible hernia repair, but was diagnosed with breast cancer and has delayed this.   Patient denies fever, chills, blood in her stool, melena, change in bowel habits, weight loss, anorexia, nausea, vomiting or abdominal pain.  Past Medical History:  Diagnosis Date  . Allergy   . Anemia ~ 04/2014  . Anxiety   . Arthritis    back  . 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 Diverticulitis    history of  . History of blood transfusion ~ 04/2014   "related to chemo"  . Inguinal hernia    Bil  . PONV (postoperative nausea and vomiting)    Pt denies  . Septic  shock ([place])    from chemo in 2015  . Wears glasses     Past Surgical History:  Procedure Laterality Date  . AXILLARY LYMPH NODE BIOPSY Right 12/2013  . BREAST BIOPSY Right 12/2013  . CHOLECYSTECTOMY  2005  . COLON RESECTION  2004   "for abscess on my colon"  . HERNIA REPAIR  2005   umb  . MASTECTOMY W/ SENTINEL NODE BIOPSY Right 06/22/2014   Procedure: RIGHT TOTAL MASTECTOMY WITH SENTINEL LYMPH NODE BIOPSY;  Surgeon: Rolm Bookbinder, MD;  Location: [facility];  Service: General;  Laterality: Right;  . PORTACATH PLACEMENT Left 01/31/2014   Procedure: INSERTION PORT-A-CATH;  Surgeon: Rolm Bookbinder, MD;  Location: [facility] OR;  Service: General;  Laterality: Left;    Current Outpatient Prescriptions  Medication Sig Dispense Refill  . acetaminophen (TYLENOL) 500 MG tablet Take 1,000 mg by mouth every 6 (six) hours as needed for mild pain.    Marland Kitchen anastrozole (ARIMIDEX) 1 MG tablet Take 1 tablet (1 mg total) by mouth daily. 90 tablet 3  . cetirizine (ZYRTEC) 10 MG tablet Take 10 mg by mouth daily.     . clonazePAM (KLONOPIN) 0.5 MG tablet Take 1 tablet (0.5 mg total) by mouth daily as needed for anxiety. 30 tablet 0  . Cyanocobalamin (VITAMIN B 12 PO) Take 1,000 mcg by mouth daily.    Marland Kitchen ibuprofen (ADVIL,MOTRIN) 800 MG tablet Take 1 tablet (800 mg total) by mouth every  8 (eight) hours as needed for moderate pain. 90 tablet 2  . lidocaine-prilocaine (EMLA) cream APPLY EXTERNALLY TO AFFECTED AREA(S) AS NEEDED 30 g 1  . lisinopril (PRINIVIL,ZESTRIL) 10 MG tablet TAKE 1 TABLET AT BEDTIME 90 tablet 3  . zolpidem (AMBIEN) 5 MG tablet Take 1 tablet (5 mg total) by mouth at bedtime as needed for sleep. 30 tablet 2   No current facility-administered medications for this visit.    Facility-Administered Medications Ordered in Other Visits  Medication Dose Route Frequency Provider Last Rate Last Dose  . sodium chloride 0.9 % injection 10 mL  10 mL Intracatheter PRN Madeline Merle, MD   10 mL at 09/15/15 1129     Allergies as of 08/22/2017 - Review Complete 08/22/2017  Allergen Reaction Noted  . Ciprofloxacin Other (See Comments) 01/10/2014    Family History  Problem Relation Age of Onset  . Cancer Father 89       colon cancer  . Colon cancer Father   . Hypertension Mother     Social History   Social History  . Marital status: Divorced    Spouse name: N/A  . Number of children: N/A  . Years of education: N/A   Occupational History  . Not on file.   Social History Main Topics  . Smoking status: Never Smoker  . Smokeless tobacco: Never Used  . Alcohol use No  . Drug use: No  . Sexual activity: No   Other Topics Concern  . Not on file   Social History Narrative  . No narrative on file    Review of Systems:    Constitutional: No weight loss, fever, chills, weakness or fatigue Skin: No rash  Cardiovascular: No chest pain Respiratory: No SOB  Gastrointestinal: See HPI and otherwise negative Genitourinary: No dysuria  Neurological: No headache Musculoskeletal: No new muscle or joint pain Hematologic: No bleeding or bruising Psychiatric: Positive for anxiety   Physical Exam:  Vital signs: BP 132/78 (BP Location: Left Arm, Patient Position: Sitting, Cuff Size: Normal)   Pulse 88   Ht 5\' 3"  (1.6 m) Comment: height measured without shoes  Wt 196 lb 8 oz (89.1 kg)   BMI 34.81 kg/m   Constitutional:   Pleasant Obese Caucasian female appears to be in NAD, Well developed, Well nourished, alert and cooperative Head:  Normocephalic and atraumatic. Eyes:   PEERL, EOMI. No icterus. Conjunctiva pink. Ears:  Normal auditory acuity. Neck:  Supple Throat: Oral cavity and pharynx without inflammation, swelling or lesion.  Respiratory: Respirations even and unlabored. Lungs clear to auscultation bilaterally.   No wheezes, crackles, or rhonchi.  Cardiovascular: Normal S1, S2. No MRG. Regular rate and rhythm. No peripheral edema, cyanosis or pallor.  Gastrointestinal:  Soft,  nondistended, nontender. No rebound or guarding. Normal bowel sounds. No appreciable masses or hepatomegaly.Ventral wall hernia RLQ appreciated, soft, no TTP, surgical scar midline Rectal:  Not performed.  Msk:  Symmetrical without gross deformities. Without edema, no deformity or joint abnormality.  Neurologic:  Alert and  oriented x4;  grossly normal neurologically.  Skin:   Dry and intact without significant lesions or rashes. Psychiatric: Demonstrates good judgement and reason without abnormal affect or behaviors.  MOST RECENT LABS AND IMAGING: CBC    Component Value Date/Time   WBC 7.8 08/08/2017 0823   WBC 7.7 06/24/2014 0015   RBC 4.78 08/08/2017 0823   RBC 2.46 (L) 06/24/2014 0015   HGB 13.9 08/08/2017 0823   HCT 42.3 08/08/2017 0823   PLT  253 08/08/2017 0823   MCV 88.5 08/08/2017 0823   MCH 29.2 08/08/2017 0823   MCH 32.1 06/24/2014 0015   MCHC 33.0 08/08/2017 0823   MCHC 31.0 06/24/2014 0015   RDW 13.4 08/08/2017 0823   LYMPHSABS 1.3 08/08/2017 0823   MONOABS 0.5 08/08/2017 0823   EOSABS 0.2 08/08/2017 0823   BASOSABS 0.1 08/08/2017 0823    CMP     Component Value Date/Time   NA 143 08/08/2017 0823   K 4.1 08/08/2017 0823   CL 105 06/23/2014 0338   CO2 27 08/08/2017 0823   GLUCOSE 97 08/08/2017 0823   BUN 17.8 08/08/2017 0823   CREATININE 0.8 08/08/2017 0823   CALCIUM 10.0 08/08/2017 0823   PROT 7.1 08/08/2017 0823   ALBUMIN 3.7 08/08/2017 0823   AST 25 08/08/2017 0823   ALT 31 08/08/2017 0823   ALKPHOS 115 08/08/2017 0823   BILITOT 0.41 08/08/2017 0823   GFRNONAA >90 06/23/2014 0338   GFRAA >90 06/23/2014 0338   PET SCAN 05/05/17 NUCLEAR MEDICINE PET SKULL BASE TO THIGH  TECHNIQUE: 9.8 mCi F-18 FDG was injected intravenously. Full-ring PET imaging was performed from the skull base to thigh after the radiotracer. CT data was obtained and used for attenuation correction and anatomic localization.  FASTING BLOOD GLUCOSE:  Value: 106  mg/dl  COMPARISON:  12/03/2016  FINDINGS: NECK  Slowly asymmetric but likely physiologic tonsillar activity, greater on the left than the right. No hypermetabolic adenopathy in neck.  CHEST  Port-A-Cath tip: SVC. No pathologic adenopathy in the chest. Mild atherosclerotic calcification of the aortic arch. Mild scarring in the left lower lobe, stable. Right mastectomy.  ABDOMEN/PELVIS  Stable pneumobilia  There is combination of anterior abdominal wall laxity, supraumbilical hernia, an right lateral abdominal wall hernia, with herniation of most of the large and small bowel through the lower right lateral abdominal wall hernia. The supraumbilical hernia contains part of the transverse colon. There is some scattered air-fluid levels in upper normal size loops of small bowel.  Accentuated left posterior anal activity, maximum SUV 13.8, formerly 19.2.  SKELETON  There is at least a right unilateral pars defect at L5, along with lumbar spondylosis at several levels. No findings of osseous metastatic disease.  IMPRESSION: 1. No recurrent malignancy identified. 2. Stable appearance of anterior abdominal wall laxity and hernias with herniation of much of the large and small bowel. 3.  Aortic Atherosclerosis (ICD10-I70.0). 4. Stable pneumobilia. 5. 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  Lumbar spondylosis.   Electronically Signed   By: Van Clines M.D.   On: 05/05/2017 10:07  Assessment: 1. Abnormal PET scan as above: showing increase rectal uptake, rectal exam was previously normal by oncologist; concern for rectal cancer 2. Family history of colon cancer: In her father diagnosed at 44 years old  Plan: 1. Discussed colonoscopy with the patient, she is agreeable. She agrees to do a split prep dose, but we have scheduled her for an afternoon colonoscopy at 2:00. She was advised to take her second dose of prep around 7 AM (7 hours before scheduled  procedure), this will allow her a few extra hours to "get everything out". 2. Patient also requested some antinausea medication to help her get through her prep. Prescribe Metoclopramide 5 mg 30 minutes before prep 2 3. Patient to return to clinic per recommendations from Dr. Ardis Hughs after time of procedure. Did discuss this case with him and he reviewed PET scan images regarding ventral hernia at time of patient's  appointment today.  Ellouise Newer, PA-C [facility] 08/22/2017, 1:14 PM  Cc: No ref. provider found

## 2017-08-22 NOTE — Progress Notes (Signed)
I agree with the above note, plan 

## 2017-09-03 ENCOUNTER — Ambulatory Visit (AMBULATORY_SURGERY_CENTER): Payer: BLUE CROSS/BLUE SHIELD | Admitting: Gastroenterology

## 2017-09-03 ENCOUNTER — Encounter: Payer: Self-pay | Admitting: Gastroenterology

## 2017-09-03 VITALS — BP 141/81 | HR 77 | Temp 99.3°F | Resp 18 | Ht 63.0 in | Wt 196.0 lb

## 2017-09-03 DIAGNOSIS — K649 Unspecified hemorrhoids: Secondary | ICD-10-CM | POA: Diagnosis not present

## 2017-09-03 DIAGNOSIS — R933 Abnormal findings on diagnostic imaging of other parts of digestive tract: Secondary | ICD-10-CM | POA: Diagnosis not present

## 2017-09-03 DIAGNOSIS — Z800 Family history of malignant neoplasm of digestive organs: Secondary | ICD-10-CM

## 2017-09-03 DIAGNOSIS — D125 Benign neoplasm of sigmoid colon: Secondary | ICD-10-CM

## 2017-09-03 DIAGNOSIS — K635 Polyp of colon: Secondary | ICD-10-CM

## 2017-09-03 MED ORDER — SODIUM CHLORIDE 0.9 % IV SOLN: 500.0000 mL | INTRAVENOUS | Status: DC

## 2017-09-03 NOTE — Patient Instructions (Signed)
YOU HAD AN ENDOSCOPIC PROCEDURE TODAY AT [facility]:   Refer to the procedure report that was given to you for any specific questions about what was found during the examination.  If the procedure report does not answer your questions, please call your gastroenterologist to clarify.  If you requested that your care partner not be given the details of your procedure findings, then the procedure report has been included in a sealed envelope for you to review at your convenience later.  YOU SHOULD EXPECT: Some feelings of bloating in the abdomen. Passage of more gas than usual.  Walking can help get rid of the air that was put into your GI tract during the procedure and reduce the bloating. If you had a lower endoscopy (such as a colonoscopy or flexible sigmoidoscopy) you may notice spotting of blood in your stool or on the toilet paper. If you underwent a bowel prep for your procedure, you may not have a normal bowel movement for a few days.  Please Note:  You might notice some irritation and congestion in your nose or some drainage.  This is from the oxygen used during your procedure.  There is no need for concern and it should clear up in a day or so.  SYMPTOMS TO REPORT IMMEDIATELY:   Following lower endoscopy (colonoscopy or flexible sigmoidoscopy):  Excessive amounts of blood in the stool  Significant tenderness or worsening of abdominal pains  Swelling of the abdomen that is new, acute  Fever of 100F or higher  For urgent or emergent issues, a gastroenterologist can be reached at any hour by calling [phone].   DIET:  We do recommend a small meal at first, but then you may proceed to your regular diet.  Drink plenty of fluids but you should avoid alcoholic beverages for 24 hours.  ACTIVITY:  You should plan to take it easy for the rest of today and you should NOT DRIVE or use heavy machinery until tomorrow (because of the sedation medicines used during the test).     FOLLOW UP: Our staff will call the number listed on your records the next business day following your procedure to check on you and address any questions or concerns that you may have regarding the information given to you following your procedure. If we do not reach you, we will leave a message.  However, if you are feeling well and you are not experiencing any problems, there is no need to return our call.  We will assume that you have returned to your regular daily activities without incident.  If any biopsies were taken you will be contacted by phone or by letter within the next 1-3 weeks.  Please call us at [phone] if you have not heard about the biopsies in 3 weeks.   Await for biopsy results to determine next repeat Colonoscopy screening Polyps (handout given) Hemorrhoids (handout given) High Fiber Diet (handout given)   SIGNATURES/CONFIDENTIALITY: You and/or your care partner have signed paperwork which will be entered into your electronic medical record.  These signatures attest to the fact that that the information above on your After Visit Summary has been reviewed and is understood.  Full responsibility of the confidentiality of this discharge information lies with you and/or your care-partner.

## 2017-09-03 NOTE — Progress Notes (Signed)
Called to room to assist during endoscopic procedure.  Patient ID and intended procedure confirmed with present staff. Received instructions for my participation in the procedure from the performing physician.  

## 2017-09-03 NOTE — Progress Notes (Signed)
Report given to PACU, vss 

## 2017-09-03 NOTE — Op Note (Signed)
[facility] Patient Name: Jina Olenick Procedure Date: 09/03/2017 1:56 PM MRN: [id number] Endoscopist: Milus Banister , MD Age: 61 Referring MD:  Date of Birth: July 12, 1956 Gender: Female Account #: [id number] Procedure:                Colonoscopy Indications:              Abnormal PET scan of the GI tract (uptake in rectum) Medicines:                Monitored Anesthesia Care Procedure:                Pre-Anesthesia Assessment:                           - Prior to the procedure, a History and Physical                            was performed, and patient medications and                            allergies were reviewed. The patient's tolerance of                            previous anesthesia was also reviewed. The risks                            and benefits of the procedure and the sedation                            options and risks were discussed with the patient.                            All questions were answered, and informed consent                            was obtained. Prior Anticoagulants: The patient has                            taken no previous anticoagulant or antiplatelet                            agents. ASA Grade Assessment: III - A patient with                            severe systemic disease. After reviewing the risks                            and benefits, the patient was deemed in                            satisfactory condition to undergo the procedure.                           After obtaining informed consent, the colonoscope  was passed under direct vision. Throughout the                            procedure, the patient's blood pressure, pulse, and                            oxygen saturations were monitored continuously. The                            Colonoscope was introduced through the anus and                            advanced to the the cecum, identified by   appendiceal orifice and ileocecal valve. The                            colonoscopy was performed without difficulty. The                            patient tolerated the procedure well. The quality                            of the bowel preparation was excellent. The                            ileocecal valve, appendiceal orifice, and rectum                            were photographed. Scope In: 2:04:13 PM Scope Out: 2:16:36 PM Scope Withdrawal Time: 0 hours 6 minutes 54 seconds  Total Procedure Duration: 0 hours 12 minutes 23 seconds  Findings:                 A 5 mm polyp was found in the sigmoid colon. The                            polyp was sessile. The polyp was removed with a                            cold snare. Resection and retrieval were complete.                           Colo-colonic anastomosis in the region of the                            sigmoid colon (from 2004 diverticuloitis surgery).                           Small internal and external hemorroids, otherwise                            normal anorectum.                           The exam was otherwise without abnormality on  direct and retroflexion views. Complications:            No immediate complications. Estimated blood loss:                            None. Estimated Blood Loss:     Estimated blood loss: none. Impression:               - One 5 mm polyp in the sigmoid colon, removed with                            a cold snare. Resected and retrieved.                           - Colo-colonic anastomosis in sigmoid region from                            2004 diverticultitis surgery.                           - Internal and external hemorrhoids. The anorectum                            was otherwise normal. PET avid findings in this                            area may have been related to transiently inflammed                            hemorrhoid. Nothing neoplastic appearing  currently. Recommendation:           - Patient has a contact number available for                            emergencies. The signs and symptoms of potential                            delayed complications were discussed with the                            patient. Return to normal activities tomorrow.                            Written discharge instructions were provided to the                            patient.                           - Resume previous diet.                           - Continue present medications.                           You will receive a letter within 2-3 weeks with the  pathology results and my final recommendations.                           If the polyp(s) is proven to be 'pre-cancerous' on                            pathology, you will need repeat colonoscopy in 5                            years. If the polyp(s) is NOT 'precancerous' on                            pathology then you should repeat colon cancer                            screening in 10 years with colonoscopy without need                            for colon cancer screening by any method prior to                            then (including stool testing). Milus Banister, MD 09/03/2017 2:23:11 PM This report has been signed electronically.

## 2017-09-04 ENCOUNTER — Telehealth: Payer: Self-pay

## 2017-09-04 NOTE — Telephone Encounter (Signed)
  Follow up Call-  Call Jyoti Harju number 09/03/2017  Post procedure Call Dacy Enrico phone  # [phone]  Permission to leave phone message No  comments no voice mail  Some recent data might be hidden     Patient questions:  Do you have a fever, pain , or abdominal swelling? No. Pain Score  0 *  Have you tolerated food without any problems? Yes.    Have you been able to return to your normal activities? Yes.    Do you have any questions about your discharge instructions: Diet   No. Medications  No. Follow up visit  No.  Do you have questions or concerns about your Care? No.  Actions: * If pain score is 4 or above: No action needed, pain <4.

## 2017-09-09 ENCOUNTER — Encounter: Payer: Self-pay | Admitting: Gastroenterology

## 2017-09-19 ENCOUNTER — Ambulatory Visit (HOSPITAL_BASED_OUTPATIENT_CLINIC_OR_DEPARTMENT_OTHER): Payer: BLUE CROSS/BLUE SHIELD

## 2017-09-19 DIAGNOSIS — Z95828 Presence of other vascular implants and grafts: Secondary | ICD-10-CM

## 2017-09-19 DIAGNOSIS — Z452 Encounter for adjustment and management of vascular access device: Secondary | ICD-10-CM | POA: Diagnosis not present

## 2017-09-19 DIAGNOSIS — C50411 Malignant neoplasm of upper-outer quadrant of right female breast: Secondary | ICD-10-CM | POA: Diagnosis not present

## 2017-09-19 MED ORDER — HEPARIN SOD (PORK) LOCK FLUSH 100 UNIT/ML IV SOLN
500.0000 [IU] | Freq: Once | INTRAVENOUS | Status: AC | PRN
  Administered 2017-09-19: 500 [IU] via INTRAVENOUS
  Filled 2017-09-19: qty 5

## 2017-09-19 MED ORDER — SODIUM CHLORIDE 0.9% FLUSH
10.0000 mL | INTRAVENOUS | Status: DC | PRN
  Administered 2017-09-19: 10 mL via INTRAVENOUS
  Filled 2017-09-19: qty 10

## 2017-10-21 ENCOUNTER — Ambulatory Visit: Payer: BLUE CROSS/BLUE SHIELD | Admitting: Gastroenterology

## 2017-11-04 ENCOUNTER — Other Ambulatory Visit: Payer: Self-pay | Admitting: Hematology

## 2017-11-04 DIAGNOSIS — C50411 Malignant neoplasm of upper-outer quadrant of right female breast: Secondary | ICD-10-CM

## 2017-11-04 DIAGNOSIS — Z170 Estrogen receptor positive status [ER+]: Principal | ICD-10-CM

## 2017-11-05 ENCOUNTER — Telehealth: Payer: Self-pay | Admitting: *Deleted

## 2017-11-05 NOTE — Telephone Encounter (Signed)
Spoke with pt and informed pt that PET scan was denied by her insurance.  Dr. Burr Medico has reordered CT scans - which will need to be authorized first.  Informed pt that radiology scheduler will contact pt with CT scan appts.  Appts for lab and flush will need to be scheduled when pt knows scans date.  Office visit with Dr. Burr Medico will be rescheduled to [facility],  December 3.   Pt voiced understanding.   Schedule message sent.

## 2017-11-06 ENCOUNTER — Telehealth: Payer: Self-pay

## 2017-11-06 NOTE — Telephone Encounter (Signed)
Pt called asking where to get her contrast for her CT. Instruction given.

## 2017-11-07 ENCOUNTER — Ambulatory Visit (HOSPITAL_COMMUNITY): Payer: BLUE CROSS/BLUE SHIELD

## 2017-11-07 ENCOUNTER — Other Ambulatory Visit: Payer: BLUE CROSS/BLUE SHIELD

## 2017-11-08 ENCOUNTER — Telehealth: Payer: Self-pay | Admitting: Hematology

## 2017-11-08 NOTE — Telephone Encounter (Signed)
Scheduled appt per 11/14 sch msg - spoke with patient regarding appts.

## 2017-11-10 ENCOUNTER — Ambulatory Visit: Payer: BLUE CROSS/BLUE SHIELD | Admitting: Hematology

## 2017-11-20 ENCOUNTER — Other Ambulatory Visit (HOSPITAL_BASED_OUTPATIENT_CLINIC_OR_DEPARTMENT_OTHER): Payer: BLUE CROSS/BLUE SHIELD

## 2017-11-20 DIAGNOSIS — C50411 Malignant neoplasm of upper-outer quadrant of right female breast: Secondary | ICD-10-CM

## 2017-11-20 DIAGNOSIS — Z170 Estrogen receptor positive status [ER+]: Principal | ICD-10-CM

## 2017-11-20 LAB — CBC WITH DIFFERENTIAL/PLATELET
BASO%: 0.4 % (ref 0.0–2.0)
Basophils Absolute: 0 10*3/uL (ref 0.0–0.1)
EOS ABS: 0.2 10*3/uL (ref 0.0–0.5)
EOS%: 2 % (ref 0.0–7.0)
HCT: 41.2 % (ref 34.8–46.6)
HGB: 13.3 g/dL (ref 11.6–15.9)
LYMPH%: 16.4 % (ref 14.0–49.7)
MCH: 29.3 pg (ref 25.1–34.0)
MCHC: 32.3 g/dL (ref 31.5–36.0)
MCV: 90.7 fL (ref 79.5–101.0)
MONO#: 0.5 10*3/uL (ref 0.1–0.9)
MONO%: 6.2 % (ref 0.0–14.0)
NEUT%: 75 % (ref 38.4–76.8)
NEUTROS ABS: 6.3 10*3/uL (ref 1.5–6.5)
Platelets: 230 10*3/uL (ref 145–400)
RBC: 4.54 10*6/uL (ref 3.70–5.45)
RDW: 13.2 % (ref 11.2–14.5)
WBC: 8.4 10*3/uL (ref 3.9–10.3)
lymph#: 1.4 10*3/uL (ref 0.9–3.3)

## 2017-11-20 LAB — COMPREHENSIVE METABOLIC PANEL
ALT: 46 U/L (ref 0–55)
AST: 39 U/L — ABNORMAL HIGH (ref 5–34)
Albumin: 3.9 g/dL (ref 3.5–5.0)
Alkaline Phosphatase: 101 U/L (ref 40–150)
Anion Gap: 10 mEq/L (ref 3–11)
BUN: 19.5 mg/dL (ref 7.0–26.0)
CHLORIDE: 106 meq/L (ref 98–109)
CO2: 28 meq/L (ref 22–29)
Calcium: 9.8 mg/dL (ref 8.4–10.4)
Creatinine: 0.8 mg/dL (ref 0.6–1.1)
GLUCOSE: 102 mg/dL (ref 70–140)
POTASSIUM: 3.8 meq/L (ref 3.5–5.1)
SODIUM: 143 meq/L (ref 136–145)
TOTAL PROTEIN: 7.4 g/dL (ref 6.4–8.3)
Total Bilirubin: 0.36 mg/dL (ref 0.20–1.20)

## 2017-11-21 ENCOUNTER — Ambulatory Visit (HOSPITAL_COMMUNITY)
Admission: RE | Admit: 2017-11-21 | Discharge: 2017-11-21 | Disposition: A | Discharge status: Home / Self Care | Payer: BLUE CROSS/BLUE SHIELD | Source: Ambulatory Visit | Attending: Hematology | Admitting: Hematology

## 2017-11-21 DIAGNOSIS — I700 Atherosclerosis of aorta: Secondary | ICD-10-CM | POA: Insufficient documentation

## 2017-11-21 DIAGNOSIS — Z170 Estrogen receptor positive status [ER+]: Secondary | ICD-10-CM | POA: Insufficient documentation

## 2017-11-21 DIAGNOSIS — K439 Ventral hernia without obstruction or gangrene: Secondary | ICD-10-CM | POA: Insufficient documentation

## 2017-11-21 DIAGNOSIS — C50411 Malignant neoplasm of upper-outer quadrant of right female breast: Secondary | ICD-10-CM | POA: Diagnosis not present

## 2017-11-21 LAB — CANCER ANTIGEN 27.29: CA 27.29: 13.6 U/mL (ref 0.0–38.6)

## 2017-11-21 MED ORDER — IOPAMIDOL (ISOVUE-300) INJECTION 61%
INTRAVENOUS | Status: AC
  Filled 2017-11-21: qty 100

## 2017-11-21 MED ORDER — IOPAMIDOL (ISOVUE-300) INJECTION 61%
100.0000 mL | Freq: Once | INTRAVENOUS | Status: AC | PRN
  Administered 2017-11-21: 100 mL via INTRAVENOUS

## 2017-11-21 NOTE — Progress Notes (Signed)
[facility]  [phone] [phone]  Clinic Follow up Note   Patient Care Team: Truitt Merle, MD as PCP - General (Hematology) 11/24/2017  SUMMARY OF ONCOLOGIC HISTORY:  Oncology History   Breast cancer of upper-outer quadrant of right female breast   Staging form: Breast, AJCC 7th Edition     Clinical: Stage IV (T2, N1, M1) - Signed by Truitt Merle, MD on 12/29/2014       Prognostic indicators: ER/PR/HER2NEU +      Pathologic: No stage assigned - Unsigned       Prognostic indicators: ER/PR/HER2NEU +        Breast cancer of upper-outer quadrant of right female breast ([place])   01/03/2014 Initial Diagnosis    Breast cancer of upper-outer quadrant of right female breast, IDA, grade 2, ER 90%+, PR 1%+, her2 (+)      01/20/2014 PET scan    Hypermetabolic mass in the posterior right breast (primary), 2 additional nodules in deep right breast (likely nodes) and 3 hyermetabolic portal nodes (lasrgest 2cm, SUV 13).       02/03/2014 - 05/20/2014 Neo-Adjuvant Chemotherapy    docetaxel, Taxotere, Carboplatin, Herceptin, Perjeta for 6 cycles       06/01/2014 PET scan    Minimal residual hypermetabolic activity within the residual mass inferolaterally in the right breast, likely surgical change. No  other hypermetabolic node or lesion.       06/10/2014 -  Anti-estrogen oral therapy    Anastrozole 1 mg once daily      06/10/2014 - 12/06/2016 Chemotherapy    Maintenance Herceptin every 3 weeks      06/22/2014 Surgery    right mastectomy with negative margines and axillary node dissection. [id number], complete patholigical resposne.       05/10/2015 Imaging    No convincing evidence of hypermetabolic metastatic disease.Right level 2 node is decreased in size and hypermetabolism. Favored to be reactive.      12/03/2016 PET scan    IMPRESSION: 1. No hypermetabolic recurrent or metastatic disease. 2. Increase in anal hypermetabolism, as detailed on prior  exams. 3. Hepatic steatosis. 4. Grossly similar abdominopelvic wall laxity and hernia containing nonobstructive large and small bowel.      05/05/2017 PET scan    PET Scan 05/05/17 IMPRESSION: 1. No recurrent malignancy identified. 2. Stable appearance of anterior abdominal wall laxity and hernias with herniation of much of the large and small bowel. 3.  Aortic Atherosclerosis (ICD10-I70.0). 4. Stable pneumobilia. 5. 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  Lumbar spondylosis      CURRENT THERAPY:  Anastrozole 1 mg once daily.   INTERVAL HISTORY:  Raymond returns for follow-up. She presents to the clinic today noting her colonoscopy was in 08/2017 which showed a polyp that was removed and her lesion was hemorrhoids. She notes she has been getting her dental work done lately and will complete that before getting her hernia repaired. She would like to alter her Ambien refilled and dose changed. She is will to cut her pill to get 7.5 mg. She had a mammogram in 07/2017 which was normal in [place]. She will occasionally do self breast exams. Overall she is doing well.     REVIEW OF SYSTEMS:   Constitutional: Denies fevers, chills or abnormal weight loss. No generalized pain.  Eyes: Denies blurriness of vision Ears, nose, mouth, throat, and face: Denies mucositis or sore throat Respiratory: Denies cough, dyspnea or wheezes Cardiovascular: Denies palpitation, chest discomfort or lower extremity  swelling Gastrointestinal:  Denies nausea, heartburn or change in bowel habits. Denies abdominal pain Skin: Denies abnormal skin rashes Immunological:  Lymphatics: Denies new lymphadenopathy or easy bruising Neurological:Denies numbness, tingling or new weaknesses Behavioral/Psych: Mood is stable, no new changes  All other systems were reviewed with the patient and are negative.  MEDICAL HISTORY:  Past Medical History:  Diagnosis Date  . Allergy   . Anemia ~ 04/2014  . Anxiety   . Arthritis     back  . Breast cancer ([place]) 12/16/13    Invasive ductal Carcinoma; 1/1 nodes positive / self palpated  . Diverticulitis    history of  . History of blood transfusion ~ 04/2014   "related to chemo"  . Inguinal hernia    Bil  . PONV (postoperative nausea and vomiting)    Pt denies  . Septic shock ([place])    from chemo in 2015  . Wears glasses    SURGICAL HISTORY: Past Surgical History:  Procedure Laterality Date  . AXILLARY LYMPH NODE BIOPSY Right 12/2013  . BREAST BIOPSY Right 12/2013  . CHOLECYSTECTOMY  2005  . COLON RESECTION  2004   "for abscess on my colon"  . HERNIA REPAIR  2005   umb  . MASTECTOMY W/ SENTINEL NODE BIOPSY Right 06/22/2014   Procedure: RIGHT TOTAL MASTECTOMY WITH SENTINEL LYMPH NODE BIOPSY;  Surgeon: Rolm Bookbinder, MD;  Location: [facility];  Service: General;  Laterality: Right;  . PORTACATH PLACEMENT Left 01/31/2014   Procedure: INSERTION PORT-A-CATH;  Surgeon: Rolm Bookbinder, MD;  Location: [facility];  Service: General;  Laterality: Left;   I have reviewed the social history and family history with the patient and they are unchanged from previous note.  ALLERGIES:  is allergic to ciprofloxacin.  MEDICATIONS:  Current Outpatient Medications  Medication Sig Dispense Refill  . acetaminophen (TYLENOL) 500 MG tablet Take 1,000 mg by mouth every 6 (six) hours as needed for mild pain.    Marland Kitchen anastrozole (ARIMIDEX) 1 MG tablet Take 1 tablet (1 mg total) by mouth daily. 90 tablet 3  . cetirizine (ZYRTEC) 10 MG tablet Take 10 mg by mouth daily.     . clonazePAM (KLONOPIN) 0.5 MG tablet Take 1 tablet (0.5 mg total) by mouth daily as needed for anxiety. 30 tablet 0  . Cyanocobalamin (VITAMIN B 12 PO) Take 1,000 mcg by mouth daily.    Marland Kitchen ibuprofen (ADVIL,MOTRIN) 800 MG tablet Take 1 tablet (800 mg total) by mouth every 8 (eight) hours as needed for moderate pain. 90 tablet 2  . lidocaine-prilocaine (EMLA) cream APPLY EXTERNALLY TO AFFECTED AREA(S) AS NEEDED 30 g 1  .  metoCLOPramide (REGLAN) 5 MG tablet Take 1 tablet (5 mg total) by mouth as directed. 30 mins before prep 2 tablet 0  . zolpidem (AMBIEN) 5 MG tablet Take 1.5 tablets (7.5 mg total) by mouth at bedtime as needed for sleep. 45 tablet 3   No current facility-administered medications for this visit.    Facility-Administered Medications Ordered in Other Visits  Medication Dose Route Frequency Provider Last Rate Last Dose  . sodium chloride 0.9 % injection 10 mL  10 mL Intracatheter PRN Truitt Merle, MD   10 mL at 09/15/15 1129    PHYSICAL EXAMINATION:  ECOG PERFORMANCE STATUS: 1  Vitals:   11/24/17 1010  BP: (!) 166/79  Pulse: (!) 101  Resp: 18  Temp: 99.1 F (37.3 C)  SpO2: 100%   Filed Weights   11/24/17 1010  Weight: 200 lb 4.8  oz (90.9 kg)    GENERAL:alert, no distress and comfortable SKIN: skin color, texture, turgor are normal, no rashes or significant lesions. OROPHARYNX:no exudate, no erythema and lips, buccal mucosa, and tongue normal  NECK: supple, thyroid normal size, non-tender, without nodularity LYMPH:  no palpable lymphadenopathy in the cervical, axillary or inguinal LUNGS: clear to auscultation and percussion with normal breathing effort HEART: regular rate & rhythm and no murmurs and no lower extremity edema ABDOMEN:abdomen soft, non-tender and normal bowel sounds, no organomegaly, (+) large abdominal wall hernia in the right lower quadrant.  Musculoskeletal:no cyanosis of digits and no clubbing  NEURO: alert & oriented x 3 with fluent speech, no focal motor/sensory deficits Right breast surgically absent,  no palpable lesions on chest wall or axilla node. Surgical scar is well healed. Left breast exam no palpable mass, skin change or nipple discharge.  LABORATORY DATA:  I have reviewed the data as listed CBC Latest Ref Rng & Units 11/20/2017 08/08/2017 05/07/2017  WBC 3.9 - 10.3 10e3/uL 8.4 7.8 8.9  Hemoglobin 11.6 - 15.9 g/dL 13.3 13.9 13.8  Hematocrit 34.8 - 46.6  % 41.2 42.3 41.3  Platelets 145 - 400 10e3/uL 230 253 254   CMP Latest Ref Rng & Units 11/20/2017 08/08/2017 05/07/2017  Glucose 70 - 140 mg/dl 102 97 93  BUN 7.0 - 26.0 mg/dL 19.5 17.8 16.0  Creatinine 0.6 - 1.1 mg/dL 0.8 0.8 0.8  Sodium 136 - 145 mEq/L 143 143 144  Potassium 3.5 - 5.1 mEq/L 3.8 4.1 4.3  Chloride 96 - 112 mEq/L - - -  CO2 22 - 29 mEq/L '28 27 25  '$ Calcium 8.4 - 10.4 mg/dL 9.8 10.0 10.0  Total Protein 6.4 - 8.3 g/dL 7.4 7.1 7.2  Total Bilirubin 0.20 - 1.20 mg/dL 0.36 0.41 0.27  Alkaline Phos 40 - 150 U/L 101 115 114  AST 5 - 34 U/L 39(H) 25 17  ALT 0 - 55 U/L 46 31 22   PROCEDURES  Colonoscopy by Dr. Ardis Hughs 09/03/17  IMPRESSION - One 5 mm polyp in the sigmoid colon, removed with a cold snare. Resected and retrieved. - Colo-colonic anastomosis in sigmoid region from 2004 diverticulitis surgery. - Internal and external hemorrhoids. The anorectum was otherwise normal. PET avid findings in this area may have been related to transiently inflammed hemorrhoid. Nothing neoplastic appearing currently.   Echo 03/18/2016 Study Conclusions - Left ventricle: The cavity size was normal. Wall thickness was   normal. Systolic function was normal. The estimated ejection   fraction was in the range of 60% to 65%. Wall motion was normal;   there were no regional wall motion abnormalities. Doppler  parameters are consistent with abnormal left ventricular relaxation (grade 1 diastolic dysfunction). - Impressions: GLS -18.2% (underestimated), Lateral s&' 10.9 cm/s. Impressions: - GLS -18.2% (underestimated), Lateral s&' 10.9 cm/s.   RADIOGRAPHIC STUDIES: I have personally reviewed the radiological images as listed and agreed with the findings in the report.  CT CAP W Contrast 11/21/17  IMPRESSION: 1. Stable exam. No new or progressive findings to suggest metastatic disease in the chest, abdomen, or pelvis. 2.  Aortic Atherosclerois (ICD10-170.0) 3. Marked laxity anterior abdominal  wall with complex right lower anterior abdominal wall hernia containing large segments of small bowel and colon.  PET Scan 05/05/17 IMPRESSION: 1. No recurrent malignancy identified. 2. Stable appearance of anterior abdominal wall laxity and hernias with herniation of much of the large and small bowel. 3.  Aortic Atherosclerosis (ICD10-I70.0). 4. Stable pneumobilia. 5. Right  leak unilateral pars defect at L5.  Lumbar spondylosis  PET/CT on 12/03/2016 IMPRESSION: 1. No hypermetabolic recurrent or metastatic disease. 2. Increase in anal hypermetabolism, as detailed on prior exams. 3. Hepatic steatosis. 4. Grossly similar abdominopelvic wall laxity and hernia containing nonobstructive large and small bowel.    ASSESSMENT AND PLAN 62 y.o.  postmenopausal woman, was found to have a 3.3cm right breast mass and a positive axillary lymph nodes, biopsy confirmed invasive ductal carcinoma, ER positive PR positive HER-2 positive.  Her initial PET scan also reviewed 3 hypermetabolic nodes in periportal, measuring up to 2 cm with SUV 14. The periportal node was not biopsied, but likely represented metastatic nodes. She had completed response to neoadjuvant chemotherapy and underwent right mastectomy.  1. Breast cancer of upper outer quadrant of right female breast, G2 IDC, with presumed metastasis to periportal nodes, ER+/PR+/HER2+ -Her case was reviewed in our tumor board previously. Her initial and post treatment PET scan was reviewed again. Due to the size and metabolic activity of those periportal lymph nodes at diagnosis, she likely has metastatic breast cancer, but she did not have biopsy to confirm it.  -She was on Herceptin for almost 3 years, I stopped Herceptin after multiple PET scan of NED, and will continue her anastrozole indefinitely.  -her last echo showed normal EF in 07/2016 she follows up with his cardiologist Dr. Haroldine Laws -I reviewed her surveillance PET scan from 05/05/2017,  which is negative for metastatic disease.  -We discussed her CT CAP from 11/21/17 showed no progression or metastasis. There was an incidental finding of an abdominal hernia. -It has almost been 4 years since her diagnosis. She plans to remove her port after 5 years, will continue to flush every 6 weeks.  -She will continue Anastrozole.  -Her 07/2017 mammogram was normal according to patient, will obtain her copy from [place].  -She is clinically doing well. I encouraged her to continue exercise and remain to be active. I advised her to keep her skin fold dry to prevent fungal infection. -F/u in 3 months    2. Hypermetabolic uptake at anus, found to be internal hemorrhoids.  -She has persistent hypermetabolic uptake at the anus from the past 4 PET scans -She is clinically asymptomatic, no pain, hematochezia or constipation. -rectal exam was negative for mass or bleeding. -She has not had colonoscopy. I have previously encouraged her to consider a colonoscopy repeatedly and she has not scheduled. -I encouraged her again on (08/08/17) to have a colonoscopy, but she is very reluctant to have one due to her difficulty to prep for colonoscopy. She is open for sigmoidoscopy at GI office, she does not want to return to her local GI, and I will refer her to [place]. She is agreeable with this plan and I referred her  -Her 09/03/17 colonoscopy showed internal  hemorrhoids and 1 benign polyp that was removed and the pathology showed a tubular adenoma.  No evidence of malignancy.   3. Anxiety and depression, insomnia  -Continue clonazepam as needed -She tried Zoloft and lexapro but could not tolerate.  -She is still on Ambien for insomnia, we discussed weaning her off Ambien, I encouraged her to establish her care with a primary care physician, and consider switch Ambien to other medication for insomnia.  -I refilled her Ambien and reduced this to '5mg'$  (08/08/17). Discussed that she could cut this in  half at home PRN. Encouraged her to begin taking this every other night to reduce her usage. -Will  slightly increase Ambien to 7.5 mg, refilled today (11/24/17). I have suggest she can try OCT melatonin or benadryl. I also reviewed sleep hygiene.   4. Vitamin D deficiency and bone health  -Her vitamin D level was 10 on 03/10/2015 -She received Vit D 50,000u weekly x6 weeks then 1000u daily  -I previously strongly encouraged her to continue taking calcium -Her DEXA scan showed normal bone density in 09/2015.  -I discussed that anastrozole will likely weaken her bones, and reviewed the treatment option for osteoporosis. She voiced good understanding. -She is due for bone density scan, I encouraged her to schedule, she prefers to do in [place]  5.) Hypertension  -Her BP is mildly elevated previously (08/08/17). Encouraged her to f/u w/ her PCP for this issue for ongoing management and continue taking her rx'd anti-hypertensives.   Plan  -Refill Ambien today, increase to 7.'5mg'$  at bedtime  -Port flush in 6 weeks -Lab, flush and f/u in 3 months  -continue anastrozole.    All questions were answered. The patient knows to call the clinic with any problems, questions or concerns. No barriers to learning was detected.  I spent 25 minutes counseling the patient face to face. The total time spent in the appointment was 30 minutes and more than 50% was on counseling and review of test results  This document serves as a record of services personally performed by Truitt Merle, MD. It was created on her behalf by Joslyn Devon, a trained medical scribe. The creation of this record is based on the scribe's personal observations and the provider's statements to them.    I have reviewed the above documentation for accuracy and completeness, and I agree with the above.   Truitt Merle 11/24/2017

## 2017-11-24 ENCOUNTER — Telehealth: Payer: Self-pay | Admitting: Hematology

## 2017-11-24 ENCOUNTER — Encounter: Payer: Self-pay | Admitting: Hematology

## 2017-11-24 ENCOUNTER — Ambulatory Visit (HOSPITAL_BASED_OUTPATIENT_CLINIC_OR_DEPARTMENT_OTHER): Payer: BLUE CROSS/BLUE SHIELD | Admitting: Hematology

## 2017-11-24 VITALS — BP 166/79 | HR 101 | Temp 99.1°F | Resp 18 | Ht 63.0 in | Wt 200.3 lb

## 2017-11-24 DIAGNOSIS — G4700 Insomnia, unspecified: Secondary | ICD-10-CM

## 2017-11-24 DIAGNOSIS — F329 Major depressive disorder, single episode, unspecified: Secondary | ICD-10-CM

## 2017-11-24 DIAGNOSIS — F419 Anxiety disorder, unspecified: Secondary | ICD-10-CM | POA: Diagnosis not present

## 2017-11-24 DIAGNOSIS — I10 Essential (primary) hypertension: Secondary | ICD-10-CM

## 2017-11-24 DIAGNOSIS — Z170 Estrogen receptor positive status [ER+]: Secondary | ICD-10-CM | POA: Diagnosis not present

## 2017-11-24 DIAGNOSIS — C50411 Malignant neoplasm of upper-outer quadrant of right female breast: Secondary | ICD-10-CM | POA: Diagnosis not present

## 2017-11-24 DIAGNOSIS — E669 Obesity, unspecified: Secondary | ICD-10-CM

## 2017-11-24 MED ORDER — ZOLPIDEM TARTRATE 5 MG PO TABS
7.5000 mg | ORAL_TABLET | Freq: Every evening | ORAL | 3 refills | Status: DC | PRN
Start: 2017-11-24 — End: 2018-03-05

## 2017-11-24 NOTE — Telephone Encounter (Signed)
Gave avs and calendar for January and March 2019 °

## 2018-03-03 NOTE — Progress Notes (Signed)
[place]  [phone] [phone]  Clinic Follow up Note   Patient Care Team: Truitt Merle, MD as PCP - General (Hematology)   Date of Service:  03/05/2018  SUMMARY OF ONCOLOGIC HISTORY:  Oncology History   Breast cancer of upper-outer quadrant of right female breast   Staging form: Breast, AJCC 7th Edition     Clinical: Stage IV (T2, N1, M1) - Signed by Truitt Merle, MD on 12/29/2014       Prognostic indicators: ER/PR/HER2NEU +      Pathologic: No stage assigned - Unsigned       Prognostic indicators: ER/PR/HER2NEU +        Breast cancer of upper-outer quadrant of right female breast ([place])   01/03/2014 Initial Diagnosis    Breast cancer of upper-outer quadrant of right female breast, IDA, grade 2, ER 90%+, PR 1%+, her2 (+)      01/20/2014 PET scan    Hypermetabolic mass in the posterior right breast (primary), 2 additional nodules in deep right breast (likely nodes) and 3 hyermetabolic portal nodes (lasrgest 2cm, SUV 13).       02/03/2014 - 05/20/2014 Neo-Adjuvant Chemotherapy    docetaxel, Taxotere, Carboplatin, Herceptin, Perjeta for 6 cycles       06/01/2014 PET scan    Minimal residual hypermetabolic activity within the residual mass inferolaterally in the right breast, likely surgical change. No  other hypermetabolic node or lesion.       06/10/2014 -  Anti-estrogen oral therapy    Anastrozole 1 mg once daily      06/10/2014 - 12/06/2016 Chemotherapy    Maintenance Herceptin every 3 weeks      06/22/2014 Surgery    right mastectomy with negative margines and axillary node dissection. ypT0N0, complete patholigical resposne.       05/10/2015 Imaging    No convincing evidence of hypermetabolic metastatic disease.Right level 2 node is decreased in size and hypermetabolism. Favored to be reactive.      12/03/2016 PET scan    IMPRESSION: 1. No hypermetabolic recurrent or metastatic disease. 2. Increase in anal hypermetabolism, as  detailed on prior exams. 3. Hepatic steatosis. 4. Grossly similar abdominopelvic wall laxity and hernia containing nonobstructive large and small bowel.      05/05/2017 PET scan    PET Scan 05/05/17 IMPRESSION: 1. No recurrent malignancy identified. 2. Stable appearance of anterior abdominal wall laxity and hernias with herniation of much of the large and small bowel. 3.  Aortic Atherosclerosis (ICD10-I70.0). 4. Stable pneumobilia. 5. 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  Lumbar spondylosis      CURRENT THERAPY:  Anastrozole 1 mg once daily.   INTERVAL HISTORY:  Madeline Davidson returns for follow-up. She presents to the clinic today notes she plans to see PCP to increase her lisinopril from '10mg'$ . She can check her BP at home. Her 7.5 gm Ambien is working well for her. She needs a refill as well as for Anastrozole. She notes she would like to get port removed, but due to her insurance costs she has to hold off on that and her bone density scan until she completes her dental work.    On review of symptoms, pt notes a sebaceous cyst under her breast and has been going down with a warm compress. She also notes a skin tag on her right neck. She denies new pain or abdominal issues.      REVIEW OF SYSTEMS:   Constitutional: Denies fevers, chills or abnormal weight  loss. No generalized pain.  Eyes: Denies blurriness of vision Ears, nose, mouth, throat, and face: Denies mucositis or sore throat Respiratory: Denies cough, dyspnea or wheezes Cardiovascular: Denies palpitation, chest discomfort or lower extremity swelling Gastrointestinal:  Denies nausea, heartburn or change in bowel habits. Denies abdominal pain Skin: Denies abnormal skin rashes (+) skin boil below inner lower left breast and skin tag on right neck  Lymphatics: Denies new lymphadenopathy or easy bruising Neurological:Denies numbness, tingling or new weaknesses Behavioral/Psych: Mood is stable, no new changes  All other systems  were reviewed with the patient and are negative.  MEDICAL HISTORY:  Past Medical History:  Diagnosis Date  . Allergy   . Anemia ~ 04/2014  . Anxiety   . Arthritis    back  . Breast cancer ([place]) 12/16/13    Invasive ductal Carcinoma; 1/1 nodes positive / self palpated  . Diverticulitis    history of  . History of blood transfusion ~ 04/2014   "related to chemo"  . Inguinal hernia    Bil  . PONV (postoperative nausea and vomiting)    Pt denies  . Septic shock ([place])    from chemo in 2015  . Wears glasses    SURGICAL HISTORY: Past Surgical History:  Procedure Laterality Date  . AXILLARY LYMPH NODE BIOPSY Right 12/2013  . BREAST BIOPSY Right 12/2013  . CHOLECYSTECTOMY  2005  . COLON RESECTION  2004   "for abscess on my colon"  . HERNIA REPAIR  2005   umb  . MASTECTOMY W/ SENTINEL NODE BIOPSY Right 06/22/2014   Procedure: RIGHT TOTAL MASTECTOMY WITH SENTINEL LYMPH NODE BIOPSY;  Surgeon: Rolm Bookbinder, MD;  Location: [facility];  Service: General;  Laterality: Right;  . PORTACATH PLACEMENT Left 01/31/2014   Procedure: INSERTION PORT-A-CATH;  Surgeon: Rolm Bookbinder, MD;  Location: [facility];  Service: General;  Laterality: Left;   I have reviewed the social history and family history with the patient and they are unchanged from previous note.  ALLERGIES:  is allergic to ciprofloxacin.  MEDICATIONS:  Current Outpatient Medications  Medication Sig Dispense Refill  . acetaminophen (TYLENOL) 500 MG tablet Take 1,000 mg by mouth every 6 (six) hours as needed for mild pain.    Marland Kitchen anastrozole (ARIMIDEX) 1 MG tablet Take 1 tablet (1 mg total) by mouth daily. 90 tablet 3  . cetirizine (ZYRTEC) 10 MG tablet Take 10 mg by mouth daily.     . clonazePAM (KLONOPIN) 0.5 MG tablet Take 1 tablet (0.5 mg total) by mouth daily as needed for anxiety. 30 tablet 0  . Cyanocobalamin (VITAMIN B 12 PO) Take 1,000 mcg by mouth once a week.     Marland Kitchen ibuprofen (ADVIL,MOTRIN) 800 MG tablet Take 1 tablet (800 mg  total) by mouth every 8 (eight) hours as needed for moderate pain. 90 tablet 2  . lidocaine-prilocaine (EMLA) cream APPLY EXTERNALLY TO AFFECTED AREA(S) AS NEEDED 30 g 1  . zolpidem (AMBIEN) 5 MG tablet Take 1.5 tablets (7.5 mg total) by mouth at bedtime as needed for sleep. 45 tablet 3   No current facility-administered medications for this visit.    Facility-Administered Medications Ordered in Other Visits  Medication Dose Route Frequency Provider Last Rate Last Dose  . sodium chloride 0.9 % injection 10 mL  10 mL Intracatheter PRN Truitt Merle, MD   10 mL at 09/15/15 1129    PHYSICAL EXAMINATION:  ECOG PERFORMANCE STATUS: 0  Vitals:   03/05/18 1014  BP: (!) 161/85  Pulse:  91  Resp: 18  Temp: 98.2 F (36.8 C)  SpO2: 99%   Filed Weights   03/05/18 1014  Weight: 199 lb 6.4 oz (90.4 kg)    GENERAL:alert, no distress and comfortable SKIN: skin color, texture, turgor are normal, no rashes or significant lesions. (+) 78m boil below left breast  OROPHARYNX:no exudate, no erythema and lips, buccal mucosa, and tongue normal  NECK: supple, thyroid normal size, non-tender, without nodularity LYMPH:  no palpable lymphadenopathy in the cervical, axillary or inguinal LUNGS: clear to auscultation and percussion with normal breathing effort HEART: regular rate & rhythm and no murmurs and no lower extremity edema ABDOMEN:abdomen soft, non-tender and normal bowel sounds, no organomegaly, (+) large abdominal wall hernia in the right lower quadrant.  Musculoskeletal:no cyanosis of digits and no clubbing  NEURO: alert & oriented x 3 with fluent speech, no focal motor/sensory deficits Right breast surgically absent,  no palpable lesions on chest wall or axilla node. Surgical scar is well healed. Left breast exam no palpable mass, skin change or nipple discharge.  LABORATORY DATA:  I have reviewed the data as listed CBC Latest Ref Rng & Units 03/05/2018 11/20/2017 08/08/2017  WBC 3.9 - 10.3 K/uL 7.1  8.4 7.8  Hemoglobin 11.6 - 15.9 g/dL 13.5 13.3 13.9  Hematocrit 34.8 - 46.6 % 41.2 41.2 42.3  Platelets 145 - 400 K/uL 230 230 253   CMP Latest Ref Rng & Units 03/05/2018 11/20/2017 08/08/2017  Glucose 70 - 140 mg/dL 111 102 97  BUN 7 - 26 mg/dL 17 19.5 17.8  Creatinine 0.60 - 1.10 mg/dL 0.83 0.8 0.8  Sodium 136 - 145 mmol/L 143 143 143  Potassium 3.5 - 5.1 mmol/L 4.0 3.8 4.1  Chloride 98 - 109 mmol/L 107 - -  CO2 22 - 29 mmol/L '26 28 27  '$ Calcium 8.4 - 10.4 mg/dL 9.6 9.8 10.0  Total Protein 6.4 - 8.3 g/dL 7.1 7.4 7.1  Total Bilirubin 0.2 - 1.2 mg/dL 0.5 0.36 0.41  Alkaline Phos 40 - 150 U/L 100 101 115  AST 5 - 34 U/L 35(H) 39(H) 25  ALT 0 - 55 U/L 41 46 31   PROCEDURES  Colonoscopy by Dr. JArdis Hughs9/12/18  IMPRESSION - One 5 mm polyp in the sigmoid colon, removed with a cold snare. Resected and retrieved. - Colo-colonic anastomosis in sigmoid region from 2004 diverticulitis surgery. - Internal and external hemorrhoids. The anorectum was otherwise normal. PET avid findings in this area may have been related to transiently inflammed hemorrhoid. Nothing neoplastic appearing currently.   Echo 03/18/2016 Study Conclusions - Left ventricle: The cavity size was normal. Wall thickness was   normal. Systolic function was normal. The estimated ejection   fraction was in the range of 60% to 65%. Wall motion was normal;   there were no regional wall motion abnormalities. Doppler  parameters are consistent with abnormal left ventricular relaxation (grade 1 diastolic dysfunction). - Impressions: GLS -18.2% (underestimated), Lateral s&' 10.9 cm/s. Impressions: - GLS -18.2% (underestimated), Lateral s&' 10.9 cm/s.   RADIOGRAPHIC STUDIES: I have personally reviewed the radiological images as listed and agreed with the findings in the report.  CT CAP W Contrast 11/21/17  IMPRESSION: 1. Stable exam. No new or progressive findings to suggest metastatic disease in the chest, abdomen, or  pelvis. 2.  Aortic Atherosclerois (ICD10-170.0) 3. Marked laxity anterior abdominal wall with complex right lower anterior abdominal wall hernia containing large segments of small bowel and colon.  PET Scan 05/05/17 IMPRESSION: 1. No  recurrent malignancy identified. 2. Stable appearance of anterior abdominal wall laxity and hernias with herniation of much of the large and small bowel. 3.  Aortic Atherosclerosis (ICD10-I70.0). 4. Stable pneumobilia. 5. 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  Lumbar spondylosis  PET/CT on 12/03/2016 IMPRESSION: 1. No hypermetabolic recurrent or metastatic disease. 2. Increase in anal hypermetabolism, as detailed on prior exams. 3. Hepatic steatosis. 4. Grossly similar abdominopelvic wall laxity and hernia containing nonobstructive large and small bowel.    ASSESSMENT AND PLAN 62 y.o.  postmenopausal woman, was found to have a 3.3cm right breast mass and a positive axillary lymph nodes, biopsy confirmed invasive ductal carcinoma, ER positive PR positive HER-2 positive.  Her initial PET scan also reviewed 3 hypermetabolic nodes in periportal, measuring up to 2 cm with SUV 14. The periportal node was not biopsied, but likely represented metastatic nodes. She had completed response to neoadjuvant chemotherapy and underwent right mastectomy.  1. Breast cancer of upper outer quadrant of right female breast, G2 IDC, with presumed metastasis to periportal nodes, ER+/PR+/HER2+ -Her case was reviewed in our tumor board previously. Her initial and post treatment PET scan was reviewed again. Due to the size and metabolic activity of those periportal lymph nodes at diagnosis, she likely has metastatic breast cancer, but she did not have biopsy to confirm it.  -She was on Herceptin for almost 3 years, I stopped Herceptin in 11/2016 after multiple PET scan showing NED, and will continue her anastrozole indefinitely.  -her 07/2016 echo showed normal EF. She follows up  with his cardiologist Dr. Haroldine Laws -I reviewed her surveillance PET scan from 05/05/2017, which is negative for metastatic disease.  -We discussed her CT CAP from 11/21/17 showed no progression or metastasis. There was an incidental finding of an abdominal hernia. -Labs reviewed with pt today  -It has been 4 years since her diagnosis. She plans to remove her port after 5 years, will continue to flush every 6-8 weeks. Based on her insurance she notes she is paying out of pocket for getting a flush. She plans to remove port after her dental procedures are complete. -Continue Anastrozole.  -Will repeat next CT scan in 10/2018 or early 2000 -Her 07/2017 Mammogram was completed in [place] was normal per pt. Will repeat in 07/2018.  -She is clinically doing well. I encouraged her to continue exercise and remain to be active.  -F/u in 4 months   2. Anxiety and depression, insomnia  -Continue clonazepam as needed -She previously tried Zoloft and lexapro but could not tolerate.  -Currently on Ambien to 7.5 mg since 11/24/17.  -I refilled Ambien today (03/05/18)  3. Vitamin D deficiency and bone health  -Her vitamin D level was 10 on 03/10/2015 -She received Vit D 50,000u weekly x6 weeks then 1000u daily  -I previously strongly encouraged her to continue taking calcium -Her DEXA scan showed normal bone density in 09/2015.  -I discussed that anastrozole will likely weaken her bones, and reviewed the treatment option for osteoporosis. She voiced good understanding. -She is due for bone density scan, I encouraged her to schedule, she prefers to do in [place]. She will schedule after dental procedure due to costs.   4.) Hypertension  -continue to f/u w/ her PCP for this issue for ongoing management and continue taking her rx'd anti-hypertensives.  -I recommend she see her PCP to increase her Lisinopril from '10mg'$ .   Plan  -Refill Ambien and anastrozole.  -Port flush in 7-8 weeks -Lab, flush and f/u  in 4 months    All questions were answered. The patient knows to call the clinic with any problems, questions or concerns. No barriers to learning was detected.  I spent 20 minutes counseling the patient face to face. The total time spent in the appointment was 30 minutes and more than 50% was on counseling and review of test results  This document serves as a record of services personally performed by Truitt Merle, MD. It was created on her behalf by Joslyn Devon, a trained medical scribe. The creation of this record is based on the scribe's personal observations and the provider's statements to them.    I have reviewed the above documentation for accuracy and completeness, and I agree with the above.   Truitt Merle 03/05/2018

## 2018-03-05 ENCOUNTER — Inpatient Hospital Stay: Payer: BLUE CROSS/BLUE SHIELD

## 2018-03-05 ENCOUNTER — Encounter: Payer: Self-pay | Admitting: Hematology

## 2018-03-05 ENCOUNTER — Inpatient Hospital Stay: Payer: BLUE CROSS/BLUE SHIELD | Attending: Hematology | Admitting: Hematology

## 2018-03-05 ENCOUNTER — Telehealth: Payer: Self-pay

## 2018-03-05 VITALS — BP 161/85 | HR 91 | Temp 98.2°F | Resp 18 | Ht 63.0 in | Wt 199.4 lb

## 2018-03-05 DIAGNOSIS — Z79811 Long term (current) use of aromatase inhibitors: Secondary | ICD-10-CM | POA: Diagnosis not present

## 2018-03-05 DIAGNOSIS — C50411 Malignant neoplasm of upper-outer quadrant of right female breast: Secondary | ICD-10-CM

## 2018-03-05 DIAGNOSIS — Z9221 Personal history of antineoplastic chemotherapy: Secondary | ICD-10-CM | POA: Insufficient documentation

## 2018-03-05 DIAGNOSIS — F329 Major depressive disorder, single episode, unspecified: Secondary | ICD-10-CM

## 2018-03-05 DIAGNOSIS — Z170 Estrogen receptor positive status [ER+]: Secondary | ICD-10-CM | POA: Diagnosis not present

## 2018-03-05 DIAGNOSIS — F419 Anxiety disorder, unspecified: Secondary | ICD-10-CM | POA: Insufficient documentation

## 2018-03-05 DIAGNOSIS — Z95828 Presence of other vascular implants and grafts: Secondary | ICD-10-CM

## 2018-03-05 DIAGNOSIS — I10 Essential (primary) hypertension: Secondary | ICD-10-CM | POA: Diagnosis not present

## 2018-03-05 DIAGNOSIS — E559 Vitamin D deficiency, unspecified: Secondary | ICD-10-CM | POA: Diagnosis not present

## 2018-03-05 DIAGNOSIS — Z79899 Other long term (current) drug therapy: Secondary | ICD-10-CM

## 2018-03-05 DIAGNOSIS — G4700 Insomnia, unspecified: Secondary | ICD-10-CM

## 2018-03-05 LAB — CBC WITH DIFFERENTIAL/PLATELET
BASOS ABS: 0 10*3/uL (ref 0.0–0.1)
Basophils Relative: 0 %
EOS PCT: 3 %
Eosinophils Absolute: 0.2 10*3/uL (ref 0.0–0.5)
HEMATOCRIT: 41.2 % (ref 34.8–46.6)
HEMOGLOBIN: 13.5 g/dL (ref 11.6–15.9)
LYMPHS ABS: 1.3 10*3/uL (ref 0.9–3.3)
LYMPHS PCT: 18 %
MCH: 29.4 pg (ref 25.1–34.0)
MCHC: 32.8 g/dL (ref 31.5–36.0)
MCV: 89.8 fL (ref 79.5–101.0)
Monocytes Absolute: 0.4 10*3/uL (ref 0.1–0.9)
Monocytes Relative: 6 %
NEUTROS PCT: 73 %
Neutro Abs: 5.2 10*3/uL (ref 1.5–6.5)
PLATELETS: 230 10*3/uL (ref 145–400)
RBC: 4.59 MIL/uL (ref 3.70–5.45)
RDW: 13.2 % (ref 11.2–14.5)
WBC: 7.1 10*3/uL (ref 3.9–10.3)

## 2018-03-05 LAB — COMPREHENSIVE METABOLIC PANEL
ALT: 41 U/L (ref 0–55)
AST: 35 U/L — AB (ref 5–34)
Albumin: 3.7 g/dL (ref 3.5–5.0)
Alkaline Phosphatase: 100 U/L (ref 40–150)
Anion gap: 10 (ref 3–11)
BILIRUBIN TOTAL: 0.5 mg/dL (ref 0.2–1.2)
BUN: 17 mg/dL (ref 7–26)
CHLORIDE: 107 mmol/L (ref 98–109)
CO2: 26 mmol/L (ref 22–29)
Calcium: 9.6 mg/dL (ref 8.4–10.4)
Creatinine, Ser: 0.83 mg/dL (ref 0.60–1.10)
Glucose, Bld: 111 mg/dL (ref 70–140)
Potassium: 4 mmol/L (ref 3.5–5.1)
Sodium: 143 mmol/L (ref 136–145)
Total Protein: 7.1 g/dL (ref 6.4–8.3)

## 2018-03-05 MED ORDER — ZOLPIDEM TARTRATE 5 MG PO TABS: 7.5000 mg | ORAL_TABLET | Freq: Every evening | ORAL | 3 refills | Status: DC | PRN

## 2018-03-05 MED ORDER — ANASTROZOLE 1 MG PO TABS
1.0000 mg | ORAL_TABLET | Freq: Every day | ORAL | 3 refills | Status: DC
Start: 2018-03-05 — End: 2019-01-01

## 2018-03-05 MED ORDER — SODIUM CHLORIDE 0.9% FLUSH
10.0000 mL | INTRAVENOUS | Status: DC | PRN
  Administered 2018-03-05: 10 mL via INTRAVENOUS
  Filled 2018-03-05: qty 10

## 2018-03-05 MED ORDER — HEPARIN SOD (PORK) LOCK FLUSH 100 UNIT/ML IV SOLN
500.0000 [IU] | Freq: Once | INTRAVENOUS | Status: AC | PRN
  Administered 2018-03-05: 500 [IU] via INTRAVENOUS
  Filled 2018-03-05: qty 5

## 2018-03-05 NOTE — Telephone Encounter (Signed)
Printed patient avs and calender of upcoming appointment. Per 3/14 los

## 2018-03-06 LAB — CANCER ANTIGEN 27.29: CAN 27.29: 13.8 U/mL (ref 0.0–38.6)

## 2018-03-16 ENCOUNTER — Telehealth: Payer: Self-pay | Admitting: *Deleted

## 2018-03-16 NOTE — Telephone Encounter (Signed)
TCT patient regarding recent lab results.  Spoke with patient and informed her that all her labs were normal, including tumor marker.  Pt voiced understanding and appreciated the call.

## 2018-05-01 ENCOUNTER — Inpatient Hospital Stay: Payer: BLUE CROSS/BLUE SHIELD | Attending: Hematology

## 2018-07-02 NOTE — Progress Notes (Signed)
[facility] [place]  [phone] [phone]  Clinic Follow up Note   Patient Care Team: Truitt Merle, MD as PCP - General (Hematology)   Date of Service:  07/03/2018  SUMMARY OF ONCOLOGIC HISTORY:  Oncology History   Breast cancer of upper-outer quadrant of right female breast   Staging form: Breast, AJCC 7th Edition     Clinical: Stage IV (T2, N1, M1) - Signed by Truitt Merle, MD on 12/29/2014       Prognostic indicators: ER/PR/HER2NEU +      Pathologic: No stage assigned - Unsigned       Prognostic indicators: ER/PR/HER2NEU +        Breast cancer of upper-outer quadrant of right female breast ([place])   01/03/2014 Initial Diagnosis    Breast cancer of upper-outer quadrant of right female breast, IDA, grade 2, ER 90%+, PR 1%+, her2 (+)      01/20/2014 PET scan    Hypermetabolic mass in the posterior right breast (primary), 2 additional nodules in deep right breast (likely nodes) and 3 hyermetabolic portal nodes (lasrgest 2cm, SUV 13).       02/03/2014 - 05/20/2014 Neo-Adjuvant Chemotherapy    docetaxel, Taxotere, Carboplatin, Herceptin, Perjeta for 6 cycles       06/01/2014 PET scan    Minimal residual hypermetabolic activity within the residual mass inferolaterally in the right breast, likely surgical change. No  other hypermetabolic node or lesion.       06/10/2014 -  Anti-estrogen oral therapy    Anastrozole 1 mg once daily      06/10/2014 - 12/06/2016 Chemotherapy    Maintenance Herceptin every 3 weeks      06/22/2014 Surgery    right mastectomy with negative margines and axillary node dissection. ypT0N0, complete patholigical resposne.       05/10/2015 Imaging    No convincing evidence of hypermetabolic metastatic disease.Right level 2 node is decreased in size and hypermetabolism. Favored to be reactive.      12/03/2016 PET scan    IMPRESSION: 1. No hypermetabolic recurrent or metastatic disease. 2. Increase in anal hypermetabolism, as  detailed on prior exams. 3. Hepatic steatosis. 4. Grossly similar abdominopelvic wall laxity and hernia containing nonobstructive large and small bowel.      05/05/2017 PET scan    PET Scan 05/05/17 IMPRESSION: 1. No recurrent malignancy identified. 2. Stable appearance of anterior abdominal wall laxity and hernias with herniation of much of the large and small bowel. 3.  Aortic Atherosclerosis (ICD10-I70.0). 4. Stable pneumobilia. 5. 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  Lumbar spondylosis      CURRENT THERAPY:  Anastrozole 1 mg once daily starting 06/10/14  INTERVAL HISTORY: Madeline Davidson returns for follow-up of her right breast cancer. She presents to the clinic today by herself. She notes she is doing well overall and still working full time. She notes she is now interested in getting hernia repaired and is looking for a referral. She notes her cyst has recently been seeping but overall smaller than before. This has been a recurrent cyst for her. She denies any new pain or symptoms. She has been taking fish oil which helps her joint stiffness.  She has been taking clorazapam which she takes as needed at home. Her date is old so she needs a new one. She notes she has a new PCP and her Liver enzyme was elevated and suspicious for fatty liver. She has a follow up with him next month. She notes she will  try to get her port removed as soon as possible as she pays out of pocket for her port flushes.     REVIEW OF SYSTEMS:   Constitutional: Denies fevers, chills or abnormal weight loss. No generalized pain.  Eyes: Denies blurriness of vision Ears, nose, mouth, throat, and face: Denies mucositis or sore throat Respiratory: Denies cough, dyspnea or wheezes Cardiovascular: Denies palpitation, chest discomfort or lower extremity swelling Gastrointestinal:  Denies nausea, heartburn or change in bowel habits. Denies abdominal pain Skin: Denies abnormal skin rashes (+) recurrent skin boil below inner  lower left breast, with occasional weepiness MSK: (+) improved joint stiffness Lymphatics: Denies new lymphadenopathy or easy bruising Neurological:Denies numbness, tingling or new weaknesses Behavioral/Psych: Mood is stable, no new changes  All other systems were reviewed with the patient and are negative.  MEDICAL HISTORY:  Past Medical History:  Diagnosis Date  . Allergy   . Anemia ~ 04/2014  . Anxiety   . Arthritis    back  . Breast cancer ([place]) 12/16/13    Invasive ductal Carcinoma; 1/1 nodes positive / self palpated  . Diverticulitis    history of  . History of blood transfusion ~ 04/2014   "related to chemo"  . Inguinal hernia    Bil  . PONV (postoperative nausea and vomiting)    Pt denies  . Septic shock ([place])    from chemo in 2015  . Wears glasses    SURGICAL HISTORY: Past Surgical History:  Procedure Laterality Date  . AXILLARY LYMPH NODE BIOPSY Right 12/2013  . BREAST BIOPSY Right 12/2013  . CHOLECYSTECTOMY  2005  . COLON RESECTION  2004   "for abscess on my colon"  . HERNIA REPAIR  2005   umb  . MASTECTOMY W/ SENTINEL NODE BIOPSY Right 06/22/2014   Procedure: RIGHT TOTAL MASTECTOMY WITH SENTINEL LYMPH NODE BIOPSY;  Surgeon: Rolm Bookbinder, MD;  Location: [facility];  Service: General;  Laterality: Right;  . PORTACATH PLACEMENT Left 01/31/2014   Procedure: INSERTION PORT-A-CATH;  Surgeon: Rolm Bookbinder, MD;  Location: [facility];  Service: General;  Laterality: Left;   I have reviewed the social history and family history with the patient and they are unchanged from previous note.  ALLERGIES:  is allergic to ciprofloxacin.  MEDICATIONS:  Current Outpatient Medications  Medication Sig Dispense Refill  . acetaminophen (TYLENOL) 500 MG tablet Take 1,000 mg by mouth every 6 (six) hours as needed for mild pain.    Marland Kitchen anastrozole (ARIMIDEX) 1 MG tablet Take 1 tablet (1 mg total) by mouth daily. 90 tablet 3  . cetirizine (ZYRTEC) 10 MG tablet Take 10 mg by mouth daily.      . clonazePAM (KLONOPIN) 0.5 MG tablet Take 1 tablet (0.5 mg total) by mouth daily as needed for anxiety. 15 tablet 0  . ibuprofen (ADVIL,MOTRIN) 800 MG tablet Take 1 tablet (800 mg total) by mouth every 8 (eight) hours as needed for moderate pain. 90 tablet 2  . lidocaine-prilocaine (EMLA) cream APPLY EXTERNALLY TO AFFECTED AREA(S) AS NEEDED 30 g 1  . Omega-3 Fatty Acids (FISH OIL) 500 MG CAPS Take 1,000 mg by mouth.    . zolpidem (AMBIEN) 5 MG tablet Take 1.5 tablets (7.5 mg total) by mouth at bedtime as needed for sleep. 45 tablet 3  . Cyanocobalamin (VITAMIN B 12 PO) Take 1,000 mcg by mouth once a week.      No current facility-administered medications for this visit.    Facility-Administered Medications Ordered in Other Visits  Medication Dose Route Frequency Provider Last Rate Last Dose  . sodium chloride 0.9 % injection 10 mL  10 mL Intracatheter PRN Truitt Merle, MD   10 mL at 09/15/15 1129    PHYSICAL EXAMINATION:  ECOG PERFORMANCE STATUS: 0  Vitals:   07/03/18 0834  BP: (!) 155/89  Pulse: 82  Resp: 18  Temp: 98.9 F (37.2 C)  SpO2: 100%   Filed Weights   07/03/18 0834  Weight: 200 lb (90.7 kg)    GENERAL:alert, no distress and comfortable SKIN: skin color, texture, turgor are normal, no rashes or significant lesions. (+) 64m boil below left breast is healed with skin hyperpigmentation OROPHARYNX:no exudate, no erythema and lips, buccal mucosa, and tongue normal  NECK: supple, thyroid normal size, non-tender, without nodularity LYMPH:  no palpable lymphadenopathy in the cervical, axillary or inguinal LUNGS: clear to auscultation and percussion with normal breathing effort HEART: regular rate & rhythm and no murmurs and no lower extremity edema ABDOMEN:abdomen soft, non-tender and normal bowel sounds, no organomegaly, (+) large abdominal wall hernia in the right lower quadrant.  Musculoskeletal:no cyanosis of digits and no clubbing  NEURO: alert & oriented x 3 with  fluent speech, no focal motor/sensory deficits BREAST: S/p right mastectomy: Right breast surgically absent,  no palpable lesions on chest wall or axilla node. Surgical scar is well healed. Left breast exam no palpable mass, skin change or nipple discharge.  LABORATORY DATA:  I have reviewed the data as listed CBC Latest Ref Rng & Units 07/03/2018 03/05/2018 11/20/2017  WBC 3.9 - 10.3 K/uL 6.3 7.1 8.4  Hemoglobin 11.6 - 15.9 g/dL 12.9 13.5 13.3  Hematocrit 34.8 - 46.6 % 38.6 41.2 41.2  Platelets 145 - 400 K/uL 206 230 230   CMP Latest Ref Rng & Units 07/03/2018 03/05/2018 11/20/2017  Glucose 70 - 99 mg/dL 108(H) 111 102  BUN 8 - 23 mg/dL 18 17 19.5  Creatinine 0.44 - 1.00 mg/dL 0.82 0.83 0.8  Sodium 135 - 145 mmol/L 145 143 143  Potassium 3.5 - 5.1 mmol/L 4.0 4.0 3.8  Chloride 98 - 111 mmol/L 108 107 -  CO2 22 - 32 mmol/L '27 26 28  '$ Calcium 8.9 - 10.3 mg/dL 9.4 9.6 9.8  Total Protein 6.5 - 8.1 g/dL 6.8 7.1 7.4  Total Bilirubin 0.3 - 1.2 mg/dL 0.5 0.5 0.36  Alkaline Phos 38 - 126 U/L 97 100 101  AST 15 - 41 U/L 29 35(H) 39(H)  ALT 0 - 44 U/L 37 41 46   PROCEDURES  Colonoscopy by Dr. JArdis Hughs9/12/18  IMPRESSION - One 5 mm polyp in the sigmoid colon, removed with a cold snare. Resected and retrieved. - Colo-colonic anastomosis in sigmoid region from 2004 diverticulitis surgery. - Internal and external hemorrhoids. The anorectum was otherwise normal. PET avid findings in this area may have been related to transiently inflamed hemorrhoid. Nothing neoplastic appearing currently.   Echo 03/18/2016 Study Conclusions - Left ventricle: The cavity size was normal. Wall thickness was   normal. Systolic function was normal. The estimated ejection   fraction was in the range of 60% to 65%. Wall motion was normal;   there were no regional wall motion abnormalities. Doppler  parameters are consistent with abnormal left ventricular relaxation (grade 1 diastolic dysfunction). - Impressions: GLS -18.2%  (underestimated), Lateral s&' 10.9 cm/s. Impressions: - GLS -18.2% (underestimated), Lateral s&' 10.9 cm/s.   RADIOGRAPHIC STUDIES: I have personally reviewed the radiological images as listed and agreed with the findings in the  report.  CT CAP W Contrast 11/21/17  IMPRESSION: 1. Stable exam. No new or progressive findings to suggest metastatic disease in the chest, abdomen, or pelvis. 2.  Aortic Atherosclerois (ICD10-170.0) 3. Marked laxity anterior abdominal wall with complex right lower anterior abdominal wall hernia containing large segments of small bowel and colon.  PET Scan 05/05/17 IMPRESSION: 1. No recurrent malignancy identified. 2. Stable appearance of anterior abdominal wall laxity and hernias with herniation of much of the large and small bowel. 3.  Aortic Atherosclerosis (ICD10-I70.0). 4. Stable pneumobilia. 5. 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  Lumbar spondylosis  PET/CT on 12/03/2016 IMPRESSION: 1. No hypermetabolic recurrent or metastatic disease. 2. Increase in anal hypermetabolism, as detailed on prior exams. 3. Hepatic steatosis. 4. Grossly similar abdominopelvic wall laxity and hernia containing nonobstructive large and small bowel.    ASSESSMENT AND PLAN 62 y.o.  postmenopausal woman, was found to have a 3.3cm right breast mass and a positive axillary lymph nodes, biopsy confirmed invasive ductal carcinoma, ER positive PR positive HER-2 positive.  Her initial PET scan also reviewed 3 hypermetabolic nodes in periportal, measuring up to 2 cm with SUV 14. The periportal node was not biopsied, but likely represented metastatic nodes. She had completed response to neoadjuvant chemotherapy and underwent right mastectomy.  1. Breast cancer of upper outer quadrant of right female breast, G2 IDC, with presumed metastasis to periportal nodes, ER+/PR+/HER2+ -Her case was reviewed in our tumor board previously. Her initial and post treatment PET scan was  reviewed again. Due to the size and metabolic activity of those periportal lymph nodes at diagnosis, she likely has metastatic breast cancer, but she did not have biopsy to confirm it.  -She was on Herceptin for almost 3 years, I stopped Herceptin in 11/2016 after multiple PET scan showing NED, and will continue her anastrozole indefinitely.  -I reviewed her surveillance PET scan from 05/05/2017, which is negative for metastatic disease.  -We discussed her CT CAP from 11/21/17 showed no progression or metastasis. There was an incidental finding of an abdominal hernia. -It has been 4 years since her diagnosis. Based on her insurance she notes she is paying out of pocket for getting a flush. I discussed if she does not get flushed there is risk for blood clot so the longest she can go with no flush is 6-8 weeks. She plans to remove port at same time as hernia repair.  -She is clinically doing well. Lab reviewed, her CBC and CMP are within normal limits. Her physical exam and her 07/2017 mammogram were unremarkable except abdominal hernia. There is no clinical concern for recurrence -Next mammogram in [place] in 07/2018  -Pt is now interested in getting her hernia repaired and would like a surgical referral. I will send referral today.  -Continue Anastrozole -Will repeat next CT scan at the end of this year  -F/u in 4 months   2. Anxiety and depression, insomnia  -She previously tried Zoloft and lexapro but could not tolerate.  -Currently on clonazepam as needed and Ambien to 7.5 mg -I refilled Ambien and clorazapam today (07/03/18)  3. Vitamin D deficiency and bone health  -Her vitamin D level was 10 on 03/10/2015 -She received Vit D 50,000u weekly x6 weeks now on 1000u daily  -I previously encouraged her to continue taking calcium -Her DEXA scan showed normal bone density in 09/2015.  -I discussed that anastrozole will likely weaken her bones, and reviewed the treatment option for osteoporosis.  She voiced good  understanding. -She is due for bone density scan, I encouraged her to schedule, she prefers to do in [place]. She will schedule for 07/2018  4. Hypertension   -continue to f/u w/ her PCP for this issue for ongoing management and continue taking her rx'd anti-hypertensives.  -I recommend she see her PCP to increase her Lisinopril from '10mg'$ .  -BP slightly improved to 155/89 today (07/03/18)  5. Abdominal hernia -will refer to general surgery   Plan  -I refilled her clorazapam #15 tablets and Ambien today  -Mammogram and DEXA in 07/2018 in [place]  -Lab, flush and CT CAP with contrast at [facility] in the morning and f/u with me in pm in 4 months  -Continue anastrozole    All questions were answered. The patient knows to call the clinic with any problems, questions or concerns. No barriers to learning was detected.  I spent 20 minutes counseling the patient face to face. The total time spent in the appointment was 30 minutes and more than 50% was on counseling and review of test results  I, Joslyn Devon, am acting as scribe for Truitt Merle, MD.   I have reviewed the above documentation for accuracy and completeness, and I agree with the above.    Truitt Merle 07/03/2018

## 2018-07-03 ENCOUNTER — Encounter: Payer: Self-pay | Admitting: Hematology

## 2018-07-03 ENCOUNTER — Inpatient Hospital Stay: Payer: BLUE CROSS/BLUE SHIELD

## 2018-07-03 ENCOUNTER — Inpatient Hospital Stay: Payer: BLUE CROSS/BLUE SHIELD | Attending: Hematology | Admitting: Hematology

## 2018-07-03 ENCOUNTER — Telehealth: Payer: Self-pay | Admitting: Hematology

## 2018-07-03 VITALS — BP 155/89 | HR 82 | Temp 98.9°F | Resp 18 | Ht 63.0 in | Wt 200.0 lb

## 2018-07-03 DIAGNOSIS — Z95828 Presence of other vascular implants and grafts: Secondary | ICD-10-CM

## 2018-07-03 DIAGNOSIS — I10 Essential (primary) hypertension: Secondary | ICD-10-CM | POA: Diagnosis not present

## 2018-07-03 DIAGNOSIS — G4700 Insomnia, unspecified: Secondary | ICD-10-CM | POA: Diagnosis not present

## 2018-07-03 DIAGNOSIS — K439 Ventral hernia without obstruction or gangrene: Secondary | ICD-10-CM | POA: Diagnosis not present

## 2018-07-03 DIAGNOSIS — Z79899 Other long term (current) drug therapy: Secondary | ICD-10-CM | POA: Diagnosis not present

## 2018-07-03 DIAGNOSIS — F329 Major depressive disorder, single episode, unspecified: Secondary | ICD-10-CM

## 2018-07-03 DIAGNOSIS — Z79811 Long term (current) use of aromatase inhibitors: Secondary | ICD-10-CM

## 2018-07-03 DIAGNOSIS — Z9221 Personal history of antineoplastic chemotherapy: Secondary | ICD-10-CM | POA: Insufficient documentation

## 2018-07-03 DIAGNOSIS — Z170 Estrogen receptor positive status [ER+]: Secondary | ICD-10-CM | POA: Diagnosis not present

## 2018-07-03 DIAGNOSIS — E559 Vitamin D deficiency, unspecified: Secondary | ICD-10-CM | POA: Diagnosis not present

## 2018-07-03 DIAGNOSIS — F419 Anxiety disorder, unspecified: Secondary | ICD-10-CM | POA: Diagnosis not present

## 2018-07-03 DIAGNOSIS — C50411 Malignant neoplasm of upper-outer quadrant of right female breast: Secondary | ICD-10-CM

## 2018-07-03 LAB — COMPREHENSIVE METABOLIC PANEL
ALK PHOS: 97 U/L (ref 38–126)
ALT: 37 U/L (ref 0–44)
AST: 29 U/L (ref 15–41)
Albumin: 3.8 g/dL (ref 3.5–5.0)
Anion gap: 10 (ref 5–15)
BILIRUBIN TOTAL: 0.5 mg/dL (ref 0.3–1.2)
BUN: 18 mg/dL (ref 8–23)
CALCIUM: 9.4 mg/dL (ref 8.9–10.3)
CO2: 27 mmol/L (ref 22–32)
CREATININE: 0.82 mg/dL (ref 0.44–1.00)
Chloride: 108 mmol/L (ref 98–111)
Glucose, Bld: 108 mg/dL — ABNORMAL HIGH (ref 70–99)
Potassium: 4 mmol/L (ref 3.5–5.1)
Sodium: 145 mmol/L (ref 135–145)
Total Protein: 6.8 g/dL (ref 6.5–8.1)

## 2018-07-03 LAB — CBC WITH DIFFERENTIAL/PLATELET
Basophils Absolute: 0.1 10*3/uL (ref 0.0–0.1)
Basophils Relative: 1 %
EOS PCT: 3 %
Eosinophils Absolute: 0.2 10*3/uL (ref 0.0–0.5)
HCT: 38.6 % (ref 34.8–46.6)
HEMOGLOBIN: 12.9 g/dL (ref 11.6–15.9)
LYMPHS ABS: 1.1 10*3/uL (ref 0.9–3.3)
LYMPHS PCT: 18 %
MCH: 29.2 pg (ref 25.1–34.0)
MCHC: 33.4 g/dL (ref 31.5–36.0)
MCV: 87.4 fL (ref 79.5–101.0)
Monocytes Absolute: 0.4 10*3/uL (ref 0.1–0.9)
Monocytes Relative: 6 %
Neutro Abs: 4.6 10*3/uL (ref 1.5–6.5)
Neutrophils Relative %: 72 %
Platelets: 206 10*3/uL (ref 145–400)
RBC: 4.42 MIL/uL (ref 3.70–5.45)
RDW: 13.7 % (ref 11.2–14.5)
WBC: 6.3 10*3/uL (ref 3.9–10.3)

## 2018-07-03 MED ORDER — SODIUM CHLORIDE 0.9% FLUSH
10.0000 mL | INTRAVENOUS | Status: DC | PRN
  Administered 2018-07-03: 10 mL via INTRAVENOUS
  Filled 2018-07-03: qty 10

## 2018-07-03 MED ORDER — ZOLPIDEM TARTRATE 5 MG PO TABS: 7.5000 mg | ORAL_TABLET | Freq: Every evening | ORAL | 3 refills | Status: DC | PRN

## 2018-07-03 MED ORDER — CLONAZEPAM 0.5 MG PO TABS: 0.5000 mg | ORAL_TABLET | Freq: Every day | ORAL | 0 refills | Status: AC | PRN

## 2018-07-03 MED ORDER — HEPARIN SOD (PORK) LOCK FLUSH 100 UNIT/ML IV SOLN
500.0000 [IU] | Freq: Once | INTRAVENOUS | Status: DC | PRN
  Filled 2018-07-03: qty 5

## 2018-07-03 NOTE — Telephone Encounter (Signed)
Appointments scheduled AVS/Calendar printed / contrast material provided per 7/12 los

## 2018-07-04 LAB — CANCER ANTIGEN 27.29: CAN 27.29: 7.3 U/mL (ref 0.0–38.6)

## 2018-10-23 ENCOUNTER — Telehealth: Payer: Self-pay | Admitting: Hematology

## 2018-10-23 NOTE — Telephone Encounter (Signed)
R/s appt per 11/1 sch message- pt is aware of appt ./

## 2018-10-23 NOTE — Telephone Encounter (Signed)
Called patient and unable to reach them per 10/31 sch message - unable to leave message.

## 2018-10-30 ENCOUNTER — Other Ambulatory Visit: Payer: BLUE CROSS/BLUE SHIELD

## 2018-10-30 ENCOUNTER — Ambulatory Visit: Payer: BLUE CROSS/BLUE SHIELD | Admitting: Hematology

## 2018-11-05 ENCOUNTER — Telehealth: Payer: Self-pay | Admitting: Hematology

## 2018-11-05 NOTE — Telephone Encounter (Signed)
Called regarding 12/9 °

## 2018-11-20 ENCOUNTER — Other Ambulatory Visit: Payer: BLUE CROSS/BLUE SHIELD

## 2018-11-20 ENCOUNTER — Ambulatory Visit (HOSPITAL_COMMUNITY): Payer: BLUE CROSS/BLUE SHIELD

## 2018-11-26 ENCOUNTER — Telehealth: Payer: Self-pay

## 2018-11-26 ENCOUNTER — Telehealth: Payer: Self-pay | Admitting: Hematology

## 2018-11-26 ENCOUNTER — Ambulatory Visit: Payer: BLUE CROSS/BLUE SHIELD | Admitting: Hematology

## 2018-11-26 NOTE — Telephone Encounter (Signed)
R/s appt per 12/5 sch message - pt is aware of appt date and time

## 2018-11-26 NOTE — Telephone Encounter (Signed)
Patient called stating she is sick with a sinus infection and terrible cough, taking 3 different kinds of medications from her PCP, scheduled for CT scan, advised her she should reschedule due to this illness and inability to lay still without coughing.  She will call to reschedule and we will coordinate all her other visits around this time.

## 2018-11-30 ENCOUNTER — Ambulatory Visit (HOSPITAL_COMMUNITY): Admission: RE | Admit: 2018-11-30 | Payer: BLUE CROSS/BLUE SHIELD | Source: Ambulatory Visit

## 2018-11-30 ENCOUNTER — Other Ambulatory Visit: Payer: BLUE CROSS/BLUE SHIELD

## 2018-11-30 ENCOUNTER — Ambulatory Visit: Payer: BLUE CROSS/BLUE SHIELD | Admitting: Hematology

## 2018-12-08 ENCOUNTER — Encounter: Payer: Self-pay | Admitting: Hematology

## 2018-12-08 ENCOUNTER — Ambulatory Visit (HOSPITAL_COMMUNITY)
Admission: RE | Admit: 2018-12-08 | Discharge: 2018-12-08 | Disposition: A | Discharge status: Home / Self Care | Payer: BLUE CROSS/BLUE SHIELD | Source: Ambulatory Visit | Attending: Hematology | Admitting: Hematology

## 2018-12-08 ENCOUNTER — Inpatient Hospital Stay: Payer: BLUE CROSS/BLUE SHIELD

## 2018-12-08 ENCOUNTER — Telehealth: Payer: Self-pay

## 2018-12-08 ENCOUNTER — Inpatient Hospital Stay: Payer: BLUE CROSS/BLUE SHIELD | Attending: Hematology

## 2018-12-08 ENCOUNTER — Inpatient Hospital Stay (HOSPITAL_BASED_OUTPATIENT_CLINIC_OR_DEPARTMENT_OTHER): Payer: BLUE CROSS/BLUE SHIELD | Admitting: Hematology

## 2018-12-08 VITALS — BP 137/88 | HR 109 | Temp 97.3°F | Resp 18 | Ht 63.0 in | Wt 201.0 lb

## 2018-12-08 DIAGNOSIS — Z9011 Acquired absence of right breast and nipple: Secondary | ICD-10-CM | POA: Diagnosis not present

## 2018-12-08 DIAGNOSIS — Z79899 Other long term (current) drug therapy: Secondary | ICD-10-CM | POA: Diagnosis not present

## 2018-12-08 DIAGNOSIS — Z170 Estrogen receptor positive status [ER+]: Secondary | ICD-10-CM

## 2018-12-08 DIAGNOSIS — C50411 Malignant neoplasm of upper-outer quadrant of right female breast: Secondary | ICD-10-CM | POA: Insufficient documentation

## 2018-12-08 DIAGNOSIS — Z9221 Personal history of antineoplastic chemotherapy: Secondary | ICD-10-CM | POA: Insufficient documentation

## 2018-12-08 DIAGNOSIS — F419 Anxiety disorder, unspecified: Secondary | ICD-10-CM

## 2018-12-08 DIAGNOSIS — I10 Essential (primary) hypertension: Secondary | ICD-10-CM

## 2018-12-08 DIAGNOSIS — Z79811 Long term (current) use of aromatase inhibitors: Secondary | ICD-10-CM | POA: Diagnosis not present

## 2018-12-08 LAB — COMPREHENSIVE METABOLIC PANEL
ALBUMIN: 3.7 g/dL (ref 3.5–5.0)
ALK PHOS: 95 U/L (ref 38–126)
ALT: 37 U/L (ref 0–44)
ANION GAP: 11 (ref 5–15)
AST: 38 U/L (ref 15–41)
BILIRUBIN TOTAL: 0.4 mg/dL (ref 0.3–1.2)
BUN: 13 mg/dL (ref 8–23)
CALCIUM: 9.9 mg/dL (ref 8.9–10.3)
CO2: 28 mmol/L (ref 22–32)
CREATININE: 0.8 mg/dL (ref 0.44–1.00)
Chloride: 105 mmol/L (ref 98–111)
GFR calc Af Amer: >60 mL/min (ref >60)
GFR calc non Af Amer: >60 mL/min (ref >60)
GLUCOSE: 98 mg/dL (ref 70–99)
Potassium: 4.3 mmol/L (ref 3.5–5.1)
Sodium: 144 mmol/L (ref 135–145)
Total Protein: 7.7 g/dL (ref 6.5–8.1)

## 2018-12-08 LAB — CBC WITH DIFFERENTIAL/PLATELET
Abs Immature Granulocytes: 0.05 10*3/uL (ref 0.00–0.07)
Basophils Absolute: 0.1 10*3/uL (ref 0.0–0.1)
Basophils Relative: 1 %
EOS ABS: 0.2 10*3/uL (ref 0.0–0.5)
EOS PCT: 2 %
HEMATOCRIT: 44.6 % (ref 36.0–46.0)
Hemoglobin: 14 g/dL (ref 12.0–15.0)
Immature Granulocytes: 1 %
Lymphocytes Relative: 15 %
Lymphs Abs: 1.4 10*3/uL (ref 0.7–4.0)
MCH: 28.6 pg (ref 26.0–34.0)
MCHC: 31.4 g/dL (ref 30.0–36.0)
MCV: 91 fL (ref 80.0–100.0)
MONO ABS: 0.5 10*3/uL (ref 0.1–1.0)
MONOS PCT: 5 %
NEUTROS PCT: 76 %
Neutro Abs: 6.8 10*3/uL (ref 1.7–7.7)
PLATELETS: 265 10*3/uL (ref 150–400)
RBC: 4.9 MIL/uL (ref 3.87–5.11)
RDW: 12.8 % (ref 11.5–15.5)
WBC: 8.9 10*3/uL (ref 4.0–10.5)
nRBC: 0 % (ref 0.0–0.2)

## 2018-12-08 MED ORDER — SODIUM CHLORIDE (PF) 0.9 % IJ SOLN
INTRAMUSCULAR | Status: AC
  Filled 2018-12-08: qty 50

## 2018-12-08 MED ORDER — SODIUM CHLORIDE 0.9% FLUSH
10.0000 mL | INTRAVENOUS | Status: AC | PRN
  Filled 2018-12-08: qty 10

## 2018-12-08 MED ORDER — HEPARIN SOD (PORK) LOCK FLUSH 100 UNIT/ML IV SOLN
500.0000 [IU] | Freq: Once | INTRAVENOUS | Status: AC | PRN
  Filled 2018-12-08: qty 5

## 2018-12-08 MED ORDER — IOHEXOL 300 MG/ML  SOLN
100.0000 mL | Freq: Once | INTRAMUSCULAR | Status: AC | PRN
  Administered 2018-12-08: 100 mL via INTRAVENOUS

## 2018-12-08 MED ORDER — ZOLPIDEM TARTRATE 5 MG PO TABS: 5.0000 mg | ORAL_TABLET | Freq: Every evening | ORAL | 3 refills | Status: DC | PRN

## 2018-12-08 NOTE — Telephone Encounter (Signed)
Printed avs and calender of upcoming appointment. Per 12/17 los 

## 2018-12-08 NOTE — Patient Instructions (Signed)

## 2018-12-08 NOTE — Progress Notes (Signed)
[facility]  [phone] Fax:([phone]  Clinic Follow up Note   Patient Care Team: Truitt Merle, MD as PCP - General (Hematology) 12/08/2018  CHIEF COMPLAIN: f/u breast cancer   SUMMARY OF ONCOLOGIC HISTORY: Oncology History   Breast cancer of upper-outer quadrant of right female breast   Staging form: Breast, AJCC 7th Edition     Clinical: Stage IV (T2, N1, M1) - Signed by Truitt Merle, MD on 12/29/2014       Prognostic indicators: ER/PR/HER2NEU +      Pathologic: No stage assigned - Unsigned       Prognostic indicators: ER/PR/HER2NEU +        Breast cancer of upper-outer quadrant of right female breast ([facility])   01/03/2014 Initial Diagnosis    Breast cancer of upper-outer quadrant of right female breast, IDA, grade 2, ER 90%+, PR 1%+, her2 (+)    01/20/2014 PET scan    Hypermetabolic mass in the posterior right breast (primary), 2 additional nodules in deep right breast (likely nodes) and 3 hyermetabolic portal nodes (lasrgest 2cm, SUV 13).     02/03/2014 - 05/20/2014 Neo-Adjuvant Chemotherapy    docetaxel, Taxotere, Carboplatin, Herceptin, Perjeta for 6 cycles     06/01/2014 PET scan    Minimal residual hypermetabolic activity within the residual mass inferolaterally in the right breast, likely surgical change. No  other hypermetabolic node or lesion.     06/10/2014 -  Anti-estrogen oral therapy    Anastrozole 1 mg once daily    06/10/2014 - 12/06/2016 Chemotherapy    Maintenance Herceptin every 3 weeks    06/22/2014 Surgery    right mastectomy with negative margines and axillary node dissection. ypT0N0, complete patholigical resposne.     05/10/2015 Imaging    No convincing evidence of hypermetabolic metastatic disease.Right level 2 node is decreased in size and hypermetabolism. Favored to be reactive.    12/03/2016 PET scan    IMPRESSION: 1. No hypermetabolic recurrent or metastatic disease. 2. Increase in anal hypermetabolism, as detailed on  prior exams. 3. Hepatic steatosis. 4. Grossly similar abdominopelvic wall laxity and hernia containing nonobstructive large and small bowel.    05/05/2017 PET scan    PET Scan 05/05/17 IMPRESSION: 1. No recurrent malignancy identified. 2. Stable appearance of anterior abdominal wall laxity and hernias with herniation of much of the large and small bowel. 3.  Aortic Atherosclerosis (ICD10-I70.0). 4. Stable pneumobilia. 5. 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  Lumbar spondylosis    CURRENT THERAPY: Anastrozole 1 mg daily  INTERVAL HISTORY: Madeline Davidson returns for follow-up.  She has had mild cough, with some sputum production, and some facial congestion for the past 10 days, no fever, dyspnea, or chest pain.  She was seen by her PCP, and completed a course of antibiotics.  Symptoms has improved, but not completely resolved, so she will start the second course antibiotics tomorrow.  She otherwise been doing well, has good appetite and energy level, continues working full-time, denies any other new pain, abdominal discomfort, or other symptoms.  Tolerating letrozole well, no significant hot flash, or other new side effects.  REVIEW OF SYSTEMS:   Constitutional: Denies fevers, chills or abnormal weight loss Eyes: Denies blurriness of vision Ears, nose, mouth, throat, and face: Denies mucositis or sore throat Respiratory: (+) cough and sputum production for the past 10 days  Cardiovascular: Denies palpitation, chest discomfort or lower extremity swelling Gastrointestinal:  Denies nausea, heartburn or change in bowel habits Skin: Denies abnormal skin  rashes Lymphatics: Denies new lymphadenopathy or easy bruising Neurological:Denies numbness, tingling or new weaknesses Behavioral/Psych: Mood is stable, no new changes  All other systems were reviewed with the patient and are negative.  MEDICAL HISTORY:  Past Medical History:  Diagnosis Date  . Allergy   . Anemia ~ 04/2014  . Anxiety   .  Arthritis    back  . Breast cancer ([facility]) 12/16/13    Invasive ductal Carcinoma; 1/1 nodes positive / self palpated  . Diverticulitis    history of  . History of blood transfusion ~ 04/2014   "related to chemo"  . Inguinal hernia    Bil  . PONV (postoperative nausea and vomiting)    Pt denies  . Septic shock ([facility])    from chemo in 2015  . Wears glasses     SURGICAL HISTORY: Past Surgical History:  Procedure Laterality Date  . AXILLARY LYMPH NODE BIOPSY Right 12/2013  . BREAST BIOPSY Right 12/2013  . CHOLECYSTECTOMY  2005  . COLON RESECTION  2004   "for abscess on my colon"  . HERNIA REPAIR  2005   umb  . MASTECTOMY W/ SENTINEL NODE BIOPSY Right 06/22/2014   Procedure: RIGHT TOTAL MASTECTOMY WITH SENTINEL LYMPH NODE BIOPSY;  Surgeon: Rolm Bookbinder, MD;  Location: [facility];  Service: General;  Laterality: Right;  . PORTACATH PLACEMENT Left 01/31/2014   Procedure: INSERTION PORT-A-CATH;  Surgeon: Rolm Bookbinder, MD;  Location: [facility];  Service: General;  Laterality: Left;    I have reviewed the social history and family history with the patient and they are unchanged from previous note.  ALLERGIES:  is allergic to ciprofloxacin.  MEDICATIONS:  Current Outpatient Medications  Medication Sig Dispense Refill  . anastrozole (ARIMIDEX) 1 MG tablet Take 1 tablet (1 mg total) by mouth daily. 90 tablet 3  . cetirizine (ZYRTEC) 10 MG tablet Take 10 mg by mouth daily.     . clonazePAM (KLONOPIN) 0.5 MG tablet Take 1 tablet (0.5 mg total) by mouth daily as needed for anxiety. 15 tablet 0  . Cyanocobalamin (VITAMIN B 12 PO) Take 1,000 mcg by mouth once a week.     Marland Kitchen lisinopril (PRINIVIL,ZESTRIL) 20 MG tablet Take 20 mg by mouth daily.    . Omega-3 Fatty Acids (FISH OIL) 500 MG CAPS Take 1,000 mg by mouth.    . zolpidem (AMBIEN) 5 MG tablet Take 1 tablet (5 mg total) by mouth at bedtime as needed for sleep. 30 tablet 3  . acetaminophen (TYLENOL) 500 MG tablet Take 1,000 mg by mouth every 6  (six) hours as needed for mild pain.    Marland Kitchen ibuprofen (ADVIL,MOTRIN) 800 MG tablet Take 1 tablet (800 mg total) by mouth every 8 (eight) hours as needed for moderate pain. (Patient not taking: Reported on 12/08/2018) 90 tablet 2   No current facility-administered medications for this visit.    Facility-Administered Medications Ordered in Other Visits  Medication Dose Route Frequency Provider Last Rate Last Dose  . heparin lock flush 100 unit/mL  500 Units Intravenous Once PRN Truitt Merle, MD      . sodium chloride (PF) 0.9 % injection           . sodium chloride 0.9 % injection 10 mL  10 mL Intracatheter PRN Truitt Merle, MD   10 mL at 09/15/15 1129  . sodium chloride flush (NS) 0.9 % injection 10 mL  10 mL Intravenous PRN Truitt Merle, MD        PHYSICAL EXAMINATION:  ECOG PERFORMANCE STATUS: 0 - Asymptomatic  Vitals:   12/08/18 1341  BP: 137/88  Pulse: (!) 109  Resp: 18  Temp: (!) 97.3 F (36.3 C)  SpO2: 100%   Filed Weights   12/08/18 1341  Weight: 201 lb (91.2 kg)    GENERAL:alert, no distress and comfortable SKIN: skin color, texture, turgor are normal, no rashes or significant lesions EYES: normal, Conjunctiva are pink and non-injected, sclera clear OROPHARYNX:no exudate, no erythema and lips, buccal mucosa, and tongue normal  NECK: supple, thyroid normal size, non-tender, without nodularity LYMPH:  no palpable lymphadenopathy in the cervical, axillary or inguinal LUNGS: clear to auscultation and percussion with normal breathing effort HEART: regular rate & rhythm and no murmurs and no lower extremity edema ABDOMEN:abdomen soft, non-tender and normal bowel sounds Musculoskeletal:no cyanosis of digits and no clubbing  NEURO: alert & oriented x 3 with fluent speech, no focal motor/sensory deficits Breasts: Breast inspection showed right breast is surgically absent, no palpable nodules on the chest wall.  Palpation of the left breast and axilla revealed no obvious mass that I could  appreciate.  There is a tiny white spot on the left nipple, no discharge or tenderness.  No palpable mass underneath the nipple.  LABORATORY DATA:  I have reviewed the data as listed CBC Latest Ref Rng & Units 12/08/2018 07/03/2018 03/05/2018  WBC 4.0 - 10.5 K/uL 8.9 6.3 7.1  Hemoglobin 12.0 - 15.0 g/dL 14.0 12.9 13.5  Hematocrit 36.0 - 46.0 % 44.6 38.6 41.2  Platelets 150 - 400 K/uL 265 206 230     CMP Latest Ref Rng & Units 12/08/2018 07/03/2018 03/05/2018  Glucose 70 - 99 mg/dL 98 108(H) 111  BUN 8 - 23 mg/dL '13 18 17  '$ Creatinine 0.44 - 1.00 mg/dL 0.80 0.82 0.83  Sodium 135 - 145 mmol/L 144 145 143  Potassium 3.5 - 5.1 mmol/L 4.3 4.0 4.0  Chloride 98 - 111 mmol/L 105 108 107  CO2 22 - 32 mmol/L '28 27 26  '$ Calcium 8.9 - 10.3 mg/dL 9.9 9.4 9.6  Total Protein 6.5 - 8.1 g/dL 7.7 6.8 7.1  Total Bilirubin 0.3 - 1.2 mg/dL 0.4 0.5 0.5  Alkaline Phos 38 - 126 U/L 95 97 100  AST 15 - 41 U/L 38 29 35(H)  ALT 0 - 44 U/L 37 37 41      RADIOGRAPHIC STUDIES: I have personally reviewed the radiological images as listed and agreed with the findings in the report. Ct Chest W Contrast  Result Date: 12/08/2018 CLINICAL DATA:  Restaging breast cancer. EXAM: CT CHEST, ABDOMEN, AND PELVIS WITH CONTRAST TECHNIQUE: Multidetector CT imaging of the chest, abdomen and pelvis was performed following the standard protocol during bolus administration of intravenous contrast. CONTRAST:  194m OMNIPAQUE IOHEXOL 300 MG/ML  SOLN COMPARISON:  PET-CT 05/05/2017 and CT scans 11/21/2017 FINDINGS: CT CHEST FINDINGS Cardiovascular: The heart is normal in size. No pericardial effusion. Mild tortuosity of the thoracic aorta and minimal calcifications at the aortic arch. No focal aneurysm or dissection. The branch vessels are patent. A few tiny scattered coronary artery calcifications. Mediastinum/Nodes: No mediastinal or hilar mass or lymphadenopathy. Small scattered sub 8 mm lymph nodes are noted. The esophagus is  unremarkable. Lungs/Pleura: No acute pulmonary findings. No worrisome pulmonary lesions or pulmonary nodules to suggest pulmonary metastatic disease. No pleural effusion or pleural nodules. Chest wall/musculoskeletal: Status post right mastectomy. No findings suspicious for recurrent chest wall tumor. No right-sided axillary adenopathy. The left breast is unremarkable. No left-sided  axillary adenopathy. No supraclavicular adenopathy. Stable multinodular thyroid gland. Degenerative changes involving the thoracic spine but no worrisome lytic or sclerotic bone lesions to suggest metastatic disease. CT ABDOMEN PELVIS FINDINGS Hepatobiliary: No focal hepatic lesions to suggest metastatic disease. Pneumobilia is again demonstrated and consistent with prior cholecystectomy and sphincterotomy. No significant common bile duct dilatation. Pancreas: No mass, inflammation or ductal dilatation. Spleen: Normal size.  No focal lesions. Adrenals/Urinary Tract: The adrenal glands and kidneys are unremarkable. No renal, ureteral or bladder calculi or mass. Stomach/Bowel: The stomach, duodenum, small bowel and colon are unremarkable. No acute inflammatory changes, mass lesions or obstructive findings. Vascular/Lymphatic: The aorta is normal in caliber. No dissection. The branch vessels are patent. The major venous structures are patent. No mesenteric or retroperitoneal mass or adenopathy. Small scattered lymph nodes are noted. Small epicardial lymph node on image number 45 is stable. Reproductive: The uterus and ovaries are unremarkable. Other: Stable large right-sided abdominal wall hernias containing small bowel and colon but no findings for obstruction or incarceration. Musculoskeletal: No significant bony findings. IMPRESSION: 1. Stable CT appearance of the chest, abdomen and pelvis. No findings for recurrent breast cancer, locoregional adenopathy or distant metastatic disease. 2. Stable surgical changes from a right mastectomy.  3. Status post cholecystectomy and sphincterotomy with pneumobilia. 4. Stable large anterior abdominal wall hernias containing small bowel and colon but no findings for incarceration or obstruction. Electronically Signed   By: Marijo Sanes M.D.   On: 12/08/2018 13:58   Ct Abdomen Pelvis W Contrast  Result Date: 12/08/2018 CLINICAL DATA:  Restaging breast cancer. EXAM: CT CHEST, ABDOMEN, AND PELVIS WITH CONTRAST TECHNIQUE: Multidetector CT imaging of the chest, abdomen and pelvis was performed following the standard protocol during bolus administration of intravenous contrast. CONTRAST:  168m OMNIPAQUE IOHEXOL 300 MG/ML  SOLN COMPARISON:  PET-CT 05/05/2017 and CT scans 11/21/2017 FINDINGS: CT CHEST FINDINGS Cardiovascular: The heart is normal in size. No pericardial effusion. Mild tortuosity of the thoracic aorta and minimal calcifications at the aortic arch. No focal aneurysm or dissection. The branch vessels are patent. A few tiny scattered coronary artery calcifications. Mediastinum/Nodes: No mediastinal or hilar mass or lymphadenopathy. Small scattered sub 8 mm lymph nodes are noted. The esophagus is unremarkable. Lungs/Pleura: No acute pulmonary findings. No worrisome pulmonary lesions or pulmonary nodules to suggest pulmonary metastatic disease. No pleural effusion or pleural nodules. Chest wall/musculoskeletal: Status post right mastectomy. No findings suspicious for recurrent chest wall tumor. No right-sided axillary adenopathy. The left breast is unremarkable. No left-sided axillary adenopathy. No supraclavicular adenopathy. Stable multinodular thyroid gland. Degenerative changes involving the thoracic spine but no worrisome lytic or sclerotic bone lesions to suggest metastatic disease. CT ABDOMEN PELVIS FINDINGS Hepatobiliary: No focal hepatic lesions to suggest metastatic disease. Pneumobilia is again demonstrated and consistent with prior cholecystectomy and sphincterotomy. No significant common  bile duct dilatation. Pancreas: No mass, inflammation or ductal dilatation. Spleen: Normal size.  No focal lesions. Adrenals/Urinary Tract: The adrenal glands and kidneys are unremarkable. No renal, ureteral or bladder calculi or mass. Stomach/Bowel: The stomach, duodenum, small bowel and colon are unremarkable. No acute inflammatory changes, mass lesions or obstructive findings. Vascular/Lymphatic: The aorta is normal in caliber. No dissection. The branch vessels are patent. The major venous structures are patent. No mesenteric or retroperitoneal mass or adenopathy. Small scattered lymph nodes are noted. Small epicardial lymph node on image number 45 is stable. Reproductive: The uterus and ovaries are unremarkable. Other: Stable large right-sided abdominal wall hernias containing small bowel and  colon but no findings for obstruction or incarceration. Musculoskeletal: No significant bony findings. IMPRESSION: 1. Stable CT appearance of the chest, abdomen and pelvis. No findings for recurrent breast cancer, locoregional adenopathy or distant metastatic disease. 2. Stable surgical changes from a right mastectomy. 3. Status post cholecystectomy and sphincterotomy with pneumobilia. 4. Stable large anterior abdominal wall hernias containing small bowel and colon but no findings for incarceration or obstruction. Electronically Signed   By: Marijo Sanes M.D.   On: 12/08/2018 13:58     ASSESSMENT & PLAN:  62 y.o. postmenopausal woman, with  1. Breast cancer of upper outer quadrant of right female breast, G2 IDC, with presumed metastasis to periportal nodes, ER+/PR+/HER2+, NED now  -she was found to have a 3.3cm right breast mass and a positive axillary lymph nodes, biopsy confirmed invasive ductal carcinoma, ER positive PR positive HER-2 positive.  Her initial PET scan also reviewed 3 hypermetabolic nodes in periportal, measuring up to 2 cm with SUV 14. The periportal node was not biopsied, but likely represented  metastatic nodes. She had completed response to neoadjuvant chemotherapy and underwent right mastectomy. -She has been on anastrozole since the surgery, tolerating well.  Due to the presumed metastatic disease, I will continue her anastrozole indefinitely, or at least for 10-15 years  -Maintenance Herceptin was continued after surgery, due to the no evidence of metastasis on the scan, I stopped after 3 years. -She is clinically doing very well, exam was unremarkable, lab reviewed, both CBC and CMP are within normal limits.  No clinical concern for recurrence -She had surveillance CT scan this morning, I reviewed the scan images myself, and agree with the radiologist interpretation.  There is no concern for disease recurrence.  I discussed with patient, she was quite pleased. -We will continue breast cancer surveillance.  Plan to see her back in 6 months, next restaging scan in 1 year   2. Anxiety and depression, insomnia  -She previously tried Zoloft and lexapro but could not tolerate.  -Currently on clonazepam as needed and Ambien to 5 mg, Ambien dose has been reduced lately. -I refilled Ambien  today.  3. Vitamin D deficiency and bone health  -Her vitamin D level was 10 on 03/10/2015 -She received Vit D 50,000u weekly x6 weeks now on 1000u daily  -I previously encouraged her to continue taking calcium -Her DEXA scan showed normal bone density in 09/2015.  -She is overdue for bone density scan, she agrees to repeat it next year  4. Hypertension   -continue to f/u w/ her PCP for this issue for ongoing management and continue taking her rx'd anti-hypertensives.  -I recommend she see her PCP to increase her Lisinopril from '10mg'$ .  -Stable  5. Abdominal hernia -She was evaluated by general surgeon, due to the large size of hernia, surgery was not recommended. -She knows to wear the hernia belt, and avoid lifting or carrying heavy bags.  Plan  -Labs and CT scan images reviewed with  patient, NED -Continue anastrozole, refilled ambien  -lab and f/u in 6 months    All questions were answered. The patient knows to call the clinic with any problems, questions or concerns. No barriers to learning was detected. I spent 20 minutes counseling the patient face to face. The total time spent in the appointment was 25 minutes and more than 50% was on counseling and review of test results     Truitt Merle, MD 12/08/18

## 2018-12-09 ENCOUNTER — Telehealth: Payer: Self-pay

## 2018-12-09 LAB — CANCER ANTIGEN 27.29: CAN 27.29: 10.7 U/mL (ref 0.0–38.6)

## 2018-12-09 NOTE — Telephone Encounter (Signed)
-----   Message from Truitt Merle, MD sent at 12/09/2018  7:20 AM EST ----- Please let pt know her tumor marker and CMP were all WNL, no concerns, thanks   Truitt Merle  12/09/2018

## 2018-12-09 NOTE — Telephone Encounter (Signed)
Spoke with patient per Dr. Burr Medico with lab results, tumor marker and CMP were all within normal limits, no concerns.  Patient verbalized an understanding.

## 2019-01-01 ENCOUNTER — Other Ambulatory Visit: Payer: Self-pay | Admitting: Hematology

## 2019-01-01 DIAGNOSIS — Z170 Estrogen receptor positive status [ER+]: Principal | ICD-10-CM

## 2019-01-01 DIAGNOSIS — C50411 Malignant neoplasm of upper-outer quadrant of right female breast: Secondary | ICD-10-CM

## 2019-03-17 ENCOUNTER — Telehealth: Payer: Self-pay

## 2019-03-17 ENCOUNTER — Encounter: Payer: Self-pay | Admitting: Hematology

## 2019-03-17 NOTE — Telephone Encounter (Signed)
Patient calls stating she would like a note for work to take her out of work due to her being immunocompromised.    [phone]

## 2019-03-17 NOTE — Telephone Encounter (Signed)
Faxed letter to fax number provided by the patient.

## 2019-03-31 ENCOUNTER — Other Ambulatory Visit: Payer: Self-pay | Admitting: Hematology

## 2019-04-01 ENCOUNTER — Other Ambulatory Visit: Payer: Self-pay | Admitting: Hematology

## 2019-04-01 MED ORDER — ZOLPIDEM TARTRATE 5 MG PO TABS: 5.0000 mg | ORAL_TABLET | Freq: Every evening | ORAL | 3 refills | Status: AC | PRN

## 2019-05-25 ENCOUNTER — Other Ambulatory Visit: Payer: Self-pay

## 2019-05-25 DIAGNOSIS — C50411 Malignant neoplasm of upper-outer quadrant of right female breast: Secondary | ICD-10-CM

## 2019-05-25 DIAGNOSIS — Z170 Estrogen receptor positive status [ER+]: Secondary | ICD-10-CM

## 2019-05-25 MED ORDER — ANASTROZOLE 1 MG PO TABS: 1.0000 mg | ORAL_TABLET | Freq: Every day | ORAL | 3 refills | Status: AC

## 2019-06-02 ENCOUNTER — Telehealth: Payer: Self-pay | Admitting: Hematology

## 2019-06-02 NOTE — Telephone Encounter (Signed)
Madeline Davidson, could you refer her to the following oncology practice, and let me know when referral was made and received. Thanks   [facility] [address] [place] [address] [phone]   Truitt Merle MD

## 2019-06-02 NOTE — Telephone Encounter (Signed)
Received call from scheduler stating that pt states she retired early due to [place] she knows her insurance won't cover her to go out of state & would like a referral to an Materials engineer in [place], [place].  Message to DR Burr Medico.

## 2019-06-02 NOTE — Telephone Encounter (Signed)
Called patient to see if patient was okay with her appt being a virtual visit.  Patient stated she retired early due to [place] and that her insurance will not cover out of state, and she wanted me to cancel her appts and to ask the md can she refer her to an oncology in [place], [place].  I called and spoke with the MD nurse and she is going to call the patient.

## 2019-06-09 ENCOUNTER — Other Ambulatory Visit: Payer: BLUE CROSS/BLUE SHIELD

## 2019-06-09 ENCOUNTER — Ambulatory Visit: Payer: BLUE CROSS/BLUE SHIELD | Admitting: Hematology

## 2019-08-18 ENCOUNTER — Telehealth: Payer: Self-pay | Admitting: Hematology

## 2019-08-18 NOTE — Telephone Encounter (Signed)
Faxed records to [facility] [phone]

## 2022-09-09 ENCOUNTER — Encounter: Payer: Self-pay | Admitting: Gastroenterology

## 2024-05-26 ENCOUNTER — Ambulatory Visit (INDEPENDENT_AMBULATORY_CARE_PROVIDER_SITE_OTHER): Admitting: Urology

## 2024-05-26 ENCOUNTER — Encounter: Payer: Self-pay | Admitting: Hematology

## 2024-05-26 ENCOUNTER — Encounter: Payer: Self-pay | Admitting: Urology

## 2024-05-26 VITALS — BP 174/105 | HR 98 | Ht 63.0 in | Wt 201.0 lb

## 2024-05-26 DIAGNOSIS — R319 Hematuria, unspecified: Secondary | ICD-10-CM | POA: Diagnosis not present

## 2024-05-26 LAB — MICROSCOPIC EXAMINATION
Bacteria, UA: NONE SEEN
WBC, UA: NONE SEEN /HPF (ref 0–5)

## 2024-05-26 LAB — URINALYSIS, ROUTINE W REFLEX MICROSCOPIC
Bilirubin, UA: NEGATIVE
Glucose, UA: NEGATIVE
Ketones, UA: NEGATIVE
Leukocytes,UA: NEGATIVE
Nitrite, UA: NEGATIVE
Protein,UA: NEGATIVE
Specific Gravity, UA: 1.01 (ref 1.005–1.030)
Urobilinogen, Ur: 0.2 mg/dL (ref 0.2–1.0)
pH, UA: 6 (ref 5.0–7.5)

## 2024-05-26 NOTE — Progress Notes (Signed)
 05/26/2024 1:38 PM   Kirke Pepper [facility] May 11, 1956 [id number]  Referring provider: Daphney Eans, FNP [address] [place],  [place] [address]  Gross hematuria   HPI: Ms Madeline Davidson is a 67yo here for evaluation of gross hematuria. She had an episode of gross painless hematuria 6 weeks ago. She was treated antibiotics. No culture results are available. 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 Those have since resolved. 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 / self palpated   Diverticulitis    history of   History of blood transfusion ~ 04/2014   "related to chemo"   Inguinal hernia    Bil   PONV (postoperative nausea and vomiting)    Pt denies   Septic shock (HCC)    from chemo in 2015   Wears glasses     Surgical History: Past Surgical History:  Procedure Laterality Date   AXILLARY LYMPH NODE BIOPSY Right 12/2013   BREAST BIOPSY Right 12/2013   CHOLECYSTECTOMY  2005   COLON RESECTION  2004   "for abscess on my colon"   HERNIA REPAIR  2005   umb   MASTECTOMY W/ SENTINEL NODE BIOPSY Right 06/22/2014   Procedure: RIGHT TOTAL MASTECTOMY WITH SENTINEL LYMPH NODE BIOPSY;  Surgeon: Enid Harry, MD;  Location: [facility] OR;  Service: General;  Laterality: Right;   PORTACATH PLACEMENT Left 01/31/2014   Procedure: INSERTION PORT-A-CATH;  Surgeon: Enid Harry, MD;  Location: [facility] OR;  Service: General;  Laterality: Left;    Home Medications:  Allergies as of 05/26/2024       Reactions   Ciprofloxacin Other (See Comments)   Patient had a headache after taking Cipro. Made her sinus infection symptoms worse.        Medication List        Accurate as of May 26, 2024  1:38 PM. If you have  any questions, ask your nurse or doctor.          acetaminophen  500 MG tablet Commonly known as: TYLENOL  Take 1,000 mg by mouth every 6 (six) hours as needed for mild pain.   anastrozole  1 MG tablet Commonly known as: ARIMIDEX  Take 1 tablet (1 mg total) by mouth daily.   cetirizine 10 MG tablet Commonly known as: ZYRTEC Take 10 mg by mouth daily.   clonazePAM  0.5 MG tablet Commonly known as: KLONOPIN  Take 1 tablet (0.5 mg total) by mouth daily as needed for anxiety.   Fish Oil 500 MG Caps Take 1,000 mg by mouth.   ibuprofen  800 MG tablet Commonly known as: ADVIL  Take 1 tablet (800 mg total) by mouth every 8 (eight) hours as needed for moderate pain.   lisinopril  20 MG tablet Commonly known as: ZESTRIL  Take 20 mg by mouth daily.   VITAMIN B 12 PO Take 1,000 mcg by mouth once a week.   zolpidem  5 MG tablet Commonly known as: AMBIEN  Take 1 tablet (5 mg total) by mouth at bedtime as needed for sleep.        Allergies:  Allergies  Allergen Reactions   Ciprofloxacin Other (See Comments)    Patient had a headache after taking  Cipro. Made her sinus infection symptoms worse.    Family History: Family History  Problem Relation Age of Onset   Cancer Father 43       colon cancer   Colon cancer Father    Hypertension Mother     Social History:  reports that she has never smoked. She has never used smokeless tobacco. She reports that she does not drink alcohol and does not use drugs.  ROS: All other review of systems were reviewed and are negative except what is noted above in HPI  Physical Exam: BP (!) 174/105   Pulse 98   Ht 5\' 3"  (1.6 m)   Wt 201 lb (91.2 kg)   BMI 35.61 kg/m   Constitutional:  Alert and oriented, No acute distress. HEENT: [place] AT, moist mucus membranes.  Trachea midline, no masses. Cardiovascular: No clubbing, cyanosis, or edema. Respiratory: Normal respiratory effort, no increased work of breathing. GI: Abdomen is soft, nontender,  nondistended, no abdominal masses GU: No CVA tenderness.  Lymph: No cervical or inguinal lymphadenopathy. Skin: No rashes, bruises or suspicious lesions. Neurologic: Grossly intact, no focal deficits, moving all 4 extremities. Psychiatric: Normal mood and affect.  Laboratory Data: Lab Results  Component Value Date   WBC 8.9 12/08/2018   HGB 14.0 12/08/2018   HCT 44.6 12/08/2018   MCV 91.0 12/08/2018   PLT 265 12/08/2018    Lab Results  Component Value Date   CREATININE 0.80 12/08/2018    No results found for: "PSA"  No results found for: "TESTOSTERONE"  No results found for: "HGBA1C"  Urinalysis    Component Value Date/Time   LABSPEC 1.005 05/12/2015 0931   PHURINE 5.0 05/12/2015 0931   GLUCOSEU Negative 05/12/2015 0931   HGBUR Negative 05/12/2015 0931   BILIRUBINUR Negative 05/12/2015 0931   KETONESUR Negative 05/12/2015 0931   PROTEINUR Negative 05/12/2015 0931   UROBILINOGEN 0.2 05/12/2015 0931   NITRITE Negative 05/12/2015 0931   LEUKOCYTESUR Small 05/12/2015 0931    Lab Results  Component Value Date   MUCUS Moderate 05/05/2014   BACTERIA Trace 05/12/2015    Pertinent Imaging:  No results found for this or any previous visit.  No results found for this or any previous visit.  No results found for this or any previous visit.  No results found for this or any previous visit.  No results found for this or any previous visit.  No results found for this or any previous visit.  No results found for this or any previous visit.  No results found for this or any previous visit.   Assessment & Plan:    1. Hematuria, unspecified type (Primary) Ct hematuria protocol Office cystoscopy - Urinalysis, Routine w reflex microscopic   No follow-ups on file.  Johnie Nailer, MD  [facility] [address]

## 2024-05-26 NOTE — Patient Instructions (Signed)
 Blood in the Pee (Hematuria) in Adults: What to Know  Hematuria is blood in the pee. You may be able to see blood in the pee. In some cases, a health care provider may find blood with a test.  Blood in the pee can be caused by infections of the kidney, bladder, or the urethra. The urethra is the tube that drains pee from the bladder.  Other causes may include: Kidney stones. Infection of the prostate. Cancer. Too much calcium in the pee. Conditions that are passed from parent to child. Too much exercise. Infections can be treated with medicine. A kidney stone will usually leave your body when you pee. If infections or kidney stones didn't cause the blood in the urine, then more tests may be needed. It is very important to tell your provider about any blood in your pee, even if you have no pain or the blood stops with no treatment. Blood in the pee can be a sign of a very serious problem, such as cancer. Follow these instructions at home: Medicines Take your medicines only as told. If you were given antibiotics, take them as told. Do not stop taking them even if you start to feel better. Eating and drinking Drink more fluids as told. Aim to drink 3-4 quarts (2.8-3.8 L) a day. Avoid caffeine, tea, and carbonated drinks. These can bother the bladder. Avoid alcohol if a female because it may irritate the prostate. General instructions If you have been diagnosed with a kidney stone, strain your pee to catch the stone if told by your provider. Empty your bladder often. Avoid holding pee for a long time. If you're female, make sure that: You wipe from front to back after using the bathroom. You use each piece of toilet paper only once. You pee before and after sex. It's up to you to get the results of any tests. Ask when your results will be ready and how to get them. You may need to call or meet with your provider to get your results. Keep all follow-up visits. Your provider will need to know  about any changes or any new symptoms. Contact a health care provider if: Your symptoms don't get better after 3 days. Your symptoms get worse. You have back pain or belly pain. You have a fever or chills. You throw up or feel like you may throw up. You throw up every time you take medicine. Get help right away if: You pass blood clots in your pee. You pass out. These symptoms may be an emergency. Call 911 right away. Do not wait to see if the symptoms will go away. Do not drive yourself to the hospital. This information is not intended to replace advice given to you by your health care provider. Make sure you discuss any questions you have with your health care provider. Document Revised: 09/25/2023 Document Reviewed: 09/04/2023 Elsevier Patient Education  2024 [employer].

## 2024-05-27 ENCOUNTER — Encounter: Payer: Self-pay | Admitting: Hematology

## 2024-05-28 LAB — CYTOLOGY, URINE

## 2024-06-01 ENCOUNTER — Ambulatory Visit: Payer: Self-pay

## 2024-06-09 ENCOUNTER — Ambulatory Visit (HOSPITAL_COMMUNITY)
Admission: RE | Admit: 2024-06-09 | Discharge: 2024-06-09 | Disposition: A | Discharge status: Home / Self Care | Source: Ambulatory Visit | Attending: Urology | Admitting: Urology

## 2024-06-09 DIAGNOSIS — R319 Hematuria, unspecified: Secondary | ICD-10-CM | POA: Insufficient documentation

## 2024-06-09 LAB — POCT I-STAT CREATININE: Creatinine, Ser: 0.8 mg/dL (ref 0.44–1.00)

## 2024-06-09 MED ORDER — IOHEXOL 300 MG/ML  SOLN
100.0000 mL | Freq: Once | INTRAMUSCULAR | Status: AC | PRN
  Administered 2024-06-09: 100 mL via INTRAVENOUS

## 2024-07-06 ENCOUNTER — Encounter: Payer: Self-pay | Admitting: Hematology

## 2024-07-07 ENCOUNTER — Encounter: Payer: Self-pay | Admitting: Urology

## 2024-07-07 ENCOUNTER — Ambulatory Visit (INDEPENDENT_AMBULATORY_CARE_PROVIDER_SITE_OTHER): Admitting: Urology

## 2024-07-07 VITALS — BP 154/85 | HR 102

## 2024-07-07 DIAGNOSIS — N210 Calculus in bladder: Secondary | ICD-10-CM | POA: Diagnosis not present

## 2024-07-07 DIAGNOSIS — R319 Hematuria, unspecified: Secondary | ICD-10-CM

## 2024-07-07 LAB — URINALYSIS, ROUTINE W REFLEX MICROSCOPIC
Bilirubin, UA: NEGATIVE
Glucose, UA: NEGATIVE
Ketones, UA: NEGATIVE
Leukocytes,UA: NEGATIVE
Nitrite, UA: NEGATIVE
RBC, UA: NEGATIVE
Specific Gravity, UA: 1.03 (ref 1.005–1.030)
Urobilinogen, Ur: 0.2 mg/dL (ref 0.2–1.0)
pH, UA: 6 (ref 5.0–7.5)

## 2024-07-07 MED ORDER — CEPHALEXIN 500 MG PO CAPS
500.0000 mg | ORAL_CAPSULE | Freq: Once | ORAL | Status: AC
  Administered 2024-07-07: 500 mg via ORAL

## 2024-07-07 NOTE — Progress Notes (Signed)
   07/07/24  CC: hematutia   HPI: Ms Madeline Davidson is a 68yo here for cystoscopy for gross hematuria. CT shows a 6mm right renal calculus Blood pressure (!) 154/85, pulse (!) 102. NED. A&Ox3.   No respiratory distress   Abd soft, NT, ND Normal external genitalia with patent urethral meatus  Cystoscopy Procedure Note  Patient identification was confirmed, informed consent was obtained, and patient was prepped using Betadine solution.  Lidocaine  jelly was administered per urethral meatus.    Procedure: - Flexible cystoscope introduced, without any difficulty.   - Thorough search of the bladder revealed:    normal urethral meatus    normal urothelium    1.5cm stone left posterior wall attached to the bladder wall     no ulcers     no tumors    no urethral polyps    no trabeculation  - Ureteral orifices were normal in position and appearance.  Post-Procedure: - Patient tolerated the procedure well  Assessment/ Plan: The risks/benefits/alternatives to cystolithalopaxy was explained to the patient and she understands and wishes to proceed with surgery   No follow-ups on file.  Belvie Clara, MD

## 2024-07-07 NOTE — Patient Instructions (Signed)
Bladder Stone  A bladder stone is a buildup of crystals made from the proteins and minerals found in urine. These substances build up when urine becomes too concentrated. Urine is concentrated when there is less water and more proteins and minerals in it. Bladder stones usually develop when a person has another medical condition that prevents the bladder from emptying completely. Crystals can form in the small amount of urine that is left in the bladder. Bladder stones that grow large can become painful and may block the flow of urine. What are the causes? This condition may be caused by: An enlarged prostate, which prevents the bladder from emptying well. An infection of a part of your urinary system (urinary tract infection, or UTI). This includes the: Kidneys. Bladder. Ureters. These are the tubes that carry urine to your bladder. Urethra. This is the tube that drains urine from your bladder. A weak spot in the bladder that creates a small pouch (bladder diverticulum). Nerve damage that may interfere with the signals from your brain to your bladder muscles (neurogenic bladder). This can result from conditions such as Parkinson's disease or spinal cord injuries. What increases the risk? This condition is more likely to develop in people who: Get frequent UTIs. Have another medical condition that affects the bladder. Have a history of bladder surgery. Have a spinal cord injury. Have an abnormal shape of the bladder (deformity). What are the signs or symptoms? Common symptoms of this condition include: Pain in the abdomen. A need to urinate more often. Difficulty or pain when urinating. Blood in the urine. Cloudy urine or urine that is dark in color. Pain in the penis or testicles in men. Small bladder stones do not always cause symptoms. How is this diagnosed? This condition may be diagnosed based on your symptoms, medical history, and physical exam. The physical exam will check for  tenderness in your abdomen. For men, an exam in the rectum may be done to check the prostate gland. You may have tests, such as: A urine test (urinalysis). A urine sample test to check for other infections (culture). Blood tests, including tests to look for a certain substance (creatinine). A creatinine level that is higher than normal could indicate a blockage. A procedure to check your bladder using a scope with a camera (cystoscopy). You may also have imaging studies, such as: CT scan or ultrasound of your abdomen and the area between your hip bones (pelvis or pelvic area). An X-ray of your urinary system. How is this treated? This condition may be treated with: Cystolitholapaxy. This procedure uses a laser, ultrasound, or other device to break the stone into smaller pieces. Fluids are used to flush the small pieces from the area. Surgery to remove the stone. A stent. This is a small mesh tube that is threaded into your ureter to make urine flow. Medicines to treat pain. Follow these instructions at home: Medicines Take over-the-counter and prescription medicines only as told by your health care provider. Ask your health care provider if the medicine prescribed to you: Requires you to avoid driving or using heavy machinery. Can cause constipation. You may need to take these actions to prevent or treat constipation: Take over-the-counter or prescription medicines. Eat foods that are high in fiber, such as beans, whole grains, and fresh fruits and vegetables. Limit foods that are high in fat and processed sugars, such as fried or sweet foods. Alcohol use Do not drink alcohol if: Your health care provider tells you not to  drink. You are pregnant, may be pregnant, or are planning to become pregnant. If you drink alcohol: Limit how much you drink to: 0-1 drink a day for women. 0-2 drinks a day for men. Be aware of how much alcohol is in your drink. In the [place] one drink equals one 12  oz bottle of beer (355 mL), one 5 oz glass of wine (148 mL), or one 1 oz glass of hard liquor (44 mL). Activity Rest as told by your health care provider. Return to your normal activities as told by your health care provider. Ask your health care provider what activities are safe for you. General instructions  Drink enough fluid to keep your urine pale yellow. Tell your health care provider about any unusual symptoms related to urinating. Early diagnosis of an enlarged prostate and other bladder conditions may reduce your risk of getting bladder stones. Do not use any products that contain nicotine or tobacco, such as cigarettes, e-cigarettes, or chewing tobacco. If you need help quitting, ask your health care provider. Do not use drugs. Where to find more information [name]): www.[facility].org Contact a health care provider if you: Have a fever. Feel nauseous or vomit. Are unable to urinate. Have a large amount of blood in your urine. Get help right away if you: Have severe back pain or pain in the lower part of your abdomen. Cannot eat or drink without vomiting. Vomit after taking your medicine. Summary A bladder stone is a buildup of crystals made from the proteins and minerals found in urine. These substances build up when urine becomes too concentrated. Bladder stones that grow large can become painful and may block the flow of urine. Bladder stones may be treated with a laser, a stent, surgery, or pain medicines. This information is not intended to replace advice given to you by your health care provider. Make sure you discuss any questions you have with your health care provider. Document Revised: 11/11/2022 Document Reviewed: 11/11/2022 Elsevier Patient Education  2024 [employer].

## 2024-07-08 ENCOUNTER — Other Ambulatory Visit: Payer: Self-pay

## 2024-07-08 DIAGNOSIS — N210 Calculus in bladder: Secondary | ICD-10-CM

## 2024-07-30 ENCOUNTER — Other Ambulatory Visit

## 2024-07-30 LAB — URINALYSIS, ROUTINE W REFLEX MICROSCOPIC
Bilirubin, UA: NEGATIVE
Glucose, UA: NEGATIVE
Ketones, UA: NEGATIVE
Leukocytes,UA: NEGATIVE
Nitrite, UA: NEGATIVE
Protein,UA: NEGATIVE
Specific Gravity, UA: 1.02 (ref 1.005–1.030)
Urobilinogen, Ur: 0.2 mg/dL (ref 0.2–1.0)
pH, UA: 5.5 (ref 5.0–7.5)

## 2024-07-30 LAB — MICROSCOPIC EXAMINATION
Bacteria, UA: NONE SEEN
RBC, Urine: >30 /HPF — AB (ref 0–2)

## 2024-08-04 NOTE — Patient Instructions (Signed)
 [facility]  08/04/2024     @[email or web address] @   Your procedure is scheduled on  08/09/2024.   Report to [facility] at  1130  A.M.   Call this number if you have problems the morning of surgery:  [phone]  If you experience any cold or flu symptoms such as cough, fever, chills, shortness of breath, etc. between now and your scheduled surgery, please notify us  at the above number.   Remember:  Do not eat after midnight.   You may drink clear liquids until 1130 am on 08/09/2024.       Clear liquids allowed are:                    Water , Juice (No red color; non-citric and without pulp; diabetics please choose diet or no sugar options), Carbonated beverages (diabetics please choose diet or no sugar options), Clear Tea (No creamer, milk, or cream, including half & half and powdered creamer), Black Coffee Only (No creamer, milk or cream, including half & half and powdered creamer), and Clear Sports drink (No red color; diabetics please choose diet or no sugar options)    Take these medicines the morning of surgery with A SIP OF WATER                               anastrozole , clonazepam , gabapentin.    Do not wear jewelry, make-up or nail polish, including gel polish,  artificial nails, or any other type of covering on natural nails (fingers and  toes).  Do not wear lotions, powders, or perfumes, or deodorant.  Do not shave 48 hours prior to surgery.  Men may shave face and neck.  Do not bring valuables to the hospital.  [facility] is not responsible for any belongings or valuables.  Contacts, dentures or bridgework may not be worn into surgery.  Leave your suitcase in the car.  After surgery it may be brought to your room.  For patients admitted to the hospital, discharge time will be determined by your treatment team.  Patients discharged the day of surgery will not be allowed to drive home and must have someone with them for 24 hours.    Special  instructions:   DO NOT smoke tobacco or vape for 24 hours before your procedure.  Please read over the following fact sheets that you were given. Coughing and Deep Breathing, Surgical Site Infection Prevention, Anesthesia Post-op Instructions, and Care and Recovery After Surgery       Cystoscopy A cystoscopy may be done to find or treat a condition in your lower urinary tract. Your lower urinary tract includes your bladder and urethra. The urethra is the part of your body that drains pee (urine) from your bladder. You may need this procedure if: You have: Urinary tract infections (UTIs) that keep coming back. Blood in your pee. Pain when you pee. A blockage in your urethra, such as a urinary stone. You can't control when you pee, or you have to pee a lot. There are cells that aren't normal in your pee sample. A problem is found in your bladder during a test. You need a biopsy. This is when a small piece of tissue is removed for testing. Tell a health care provider about: Any allergies you have. All medicines you take. These include vitamins, herbs, eye drops, and creams. Any problems you or family members have had with  anesthesia. Any bleeding problems you have. Any surgeries you've had. Any medical conditions you have. Whether you're pregnant or may be pregnant. What are the risks? Your health care provider will talk with you about risks. These may include: Infection. Bleeding. Allergic reactions to medicines. Damage to your urethra or bladder. What happens before the procedure? Medicines Ask about changing or stopping: Any medicines you take. Any vitamins, herbs, or supplements you take. Do not take aspirin or ibuprofen  unless you're told to. Tests You may have an exam or tests. These may include: Pee tests to check for signs of infection. X-rays of: Your bladder. Your urethra. Your kidneys. A CT scan of your belly or hips. General instructions Eat and drink only as  you've been told. For your safety, you may: Need to wash your skin with a soap that kills germs. Get antibiotics. Have hair removed at the procedure site. If you'll be going home right after the procedure, plan to have a responsible adult: Take you home from the hospital or clinic. You won't be allowed to drive. Stay with you for the time you're told. What happens during the procedure?  You may be given: A sedative to help you relax. Anesthesia to keep you from feeling pain. The opening of your urethra will be cleaned. A thin tube called a cystoscope will be put into your urethra. The tube has a light and camera on the end of it. The tube will be passed into your bladder. A germ-free (sterile) fluid will flow through the tube. The fluid will stretch your bladder. This helps your provider see the walls of your bladder more clearly. A biopsy may be taken. Stones may be removed. The tube will be taken out. Your bladder will be emptied. The procedure may vary among providers and hospitals. What can I expect after the procedure? It's common to have: Some soreness or pain in your belly and urethra. Mild pain or burning when you pee. The pain should stop a few minutes after you pee. This may last for up to a week. A small amount of blood in your pee for a few days. A feeling like you need to pee often. But when you do, you may only pee a little. Follow these instructions at home: Medicines Take your medicines only as told. If you were given antibiotics, take them as told. Do not stop taking them even if you start to feel better. General instructions If you were given a sedative, do not drive or use machines until you're told it's safe. A sedative can make you sleepy. Eat and drink as told. If a biopsy was taken, ask when your test results will be ready and how to get them. You may need to call or meet with your provider to get your results. Ask what things are safe for you to do at home.  Ask when you can go back to work or school. Contact a health care provider if: Your pain gets worse. Your pain doesn't get better with medicine. You have trouble peeing. You have more blood in your pee. You have a fever or chills. Get help right away if: You have blood clots in your pee. You can't pee. This information is not intended to replace advice given to you by your health care provider. Make sure you discuss any questions you have with your health care provider. Document Revised: 04/15/2023 Document Reviewed: 04/15/2023 Elsevier Patient Education  2024 [employer]General Anesthesia, Adult, Care After The following information offers  guidance on how to care for yourself after your procedure. Your health care provider may also give you more specific instructions. If you have problems or questions, contact your health care provider. What can I expect after the procedure? After the procedure, it is common for people to: Have pain or discomfort at the IV site. Have nausea or vomiting. Have a sore throat or hoarseness. Have trouble concentrating. Feel cold or chills. Feel weak, sleepy, or tired (fatigue). Have soreness and body aches. These can affect parts of the body that were not involved in surgery. Follow these instructions at home: For the time period you were told by your health care provider:  Rest. Do not participate in activities where you could fall or become injured. Do not drive or use machinery. Do not drink alcohol. Do not take sleeping pills or medicines that cause drowsiness. Do not make important decisions or sign legal documents. Do not take care of children on your own. General instructions Drink enough fluid to keep your urine pale yellow. If you have sleep apnea, surgery and certain medicines can increase your risk for breathing problems. Follow instructions from your health care provider about wearing your sleep device: Anytime you are sleeping,  including during daytime naps. While taking prescription pain medicines, sleeping medicines, or medicines that make you drowsy. Return to your normal activities as told by your health care provider. Ask your health care provider what activities are safe for you. Take over-the-counter and prescription medicines only as told by your health care provider. Do not use any products that contain nicotine or tobacco. These products include cigarettes, chewing tobacco, and vaping devices, such as e-cigarettes. These can delay incision healing after surgery. If you need help quitting, ask your health care provider. Contact a health care provider if: You have nausea or vomiting that does not get better with medicine. You vomit every time you eat or drink. You have pain that does not get better with medicine. You cannot urinate or have bloody urine. You develop a skin rash. You have a fever. Get help right away if: You have trouble breathing. You have chest pain. You vomit blood. These symptoms may be an emergency. Get help right away. Call 911. Do not wait to see if the symptoms will go away. Do not drive yourself to the hospital. Summary After the procedure, it is common to have a sore throat, hoarseness, nausea, vomiting, or to feel weak, sleepy, or fatigue. For the time period you were told by your health care provider, do not drive or use machinery. Get help right away if you have difficulty breathing, have chest pain, or vomit blood. These symptoms may be an emergency. This information is not intended to replace advice given to you by your health care provider. Make sure you discuss any questions you have with your health care provider. Document Revised: 03/08/2022 Document Reviewed: 03/08/2022 Elsevier Patient Education  2024 [employer].

## 2024-08-06 ENCOUNTER — Other Ambulatory Visit: Payer: Self-pay

## 2024-08-06 ENCOUNTER — Encounter (HOSPITAL_COMMUNITY)
Admission: RE | Admit: 2024-08-06 | Discharge: 2024-08-06 | Disposition: A | Discharge status: Home / Self Care | Source: Ambulatory Visit | Attending: Urology | Admitting: Urology

## 2024-08-06 ENCOUNTER — Encounter (HOSPITAL_COMMUNITY): Payer: Self-pay

## 2024-08-06 VITALS — Ht 63.0 in | Wt 201.1 lb

## 2024-08-06 VITALS — BP 160/91 | HR 88 | Resp 18 | Ht 63.0 in | Wt 201.1 lb

## 2024-08-06 DIAGNOSIS — R7303 Prediabetes: Secondary | ICD-10-CM | POA: Diagnosis not present

## 2024-08-06 DIAGNOSIS — D649 Anemia, unspecified: Secondary | ICD-10-CM | POA: Diagnosis not present

## 2024-08-06 DIAGNOSIS — E669 Obesity, unspecified: Secondary | ICD-10-CM | POA: Insufficient documentation

## 2024-08-06 DIAGNOSIS — I10 Essential (primary) hypertension: Secondary | ICD-10-CM | POA: Diagnosis not present

## 2024-08-06 DIAGNOSIS — Z6830 Body mass index (BMI) 30.0-30.9, adult: Secondary | ICD-10-CM | POA: Insufficient documentation

## 2024-08-06 DIAGNOSIS — Z01818 Encounter for other preprocedural examination: Secondary | ICD-10-CM | POA: Insufficient documentation

## 2024-08-06 HISTORY — DX: Type 2 diabetes mellitus without complications: E11.9

## 2024-08-06 LAB — CBC WITH DIFFERENTIAL/PLATELET
Abs Immature Granulocytes: 0.06 K/uL (ref 0.00–0.07)
Basophils Absolute: 0.1 K/uL (ref 0.0–0.1)
Basophils Relative: 1 %
Eosinophils Absolute: 0.2 K/uL (ref 0.0–0.5)
Eosinophils Relative: 3 %
HCT: 41.3 % (ref 36.0–46.0)
Hemoglobin: 13.5 g/dL (ref 12.0–15.0)
Immature Granulocytes: 1 %
Lymphocytes Relative: 21 %
Lymphs Abs: 1.3 K/uL (ref 0.7–4.0)
MCH: 29.4 pg (ref 26.0–34.0)
MCHC: 32.7 g/dL (ref 30.0–36.0)
MCV: 90 fL (ref 80.0–100.0)
Monocytes Absolute: 0.4 K/uL (ref 0.1–1.0)
Monocytes Relative: 6 %
Neutro Abs: 4.2 K/uL (ref 1.7–7.7)
Neutrophils Relative %: 68 %
Platelets: 208 K/uL (ref 150–400)
RBC: 4.59 MIL/uL (ref 3.87–5.11)
RDW: 13.1 % (ref 11.5–15.5)
WBC: 6.2 K/uL (ref 4.0–10.5)
nRBC: 0 % (ref 0.0–0.2)

## 2024-08-06 LAB — BASIC METABOLIC PANEL WITH GFR
Anion gap: 10 (ref 5–15)
BUN: 17 mg/dL (ref 8–23)
CO2: 23 mmol/L (ref 22–32)
Calcium: 9.5 mg/dL (ref 8.9–10.3)
Chloride: 105 mmol/L (ref 98–111)
Creatinine, Ser: 0.67 mg/dL (ref 0.44–1.00)
GFR, Estimated: >60 mL/min (ref >60)
Glucose, Bld: 103 mg/dL — ABNORMAL HIGH (ref 70–99)
Potassium: 4 mmol/L (ref 3.5–5.1)
Sodium: 138 mmol/L (ref 135–145)

## 2024-08-08 LAB — HEMOGLOBIN A1C
Hgb A1c MFr Bld: 5.8 % — ABNORMAL HIGH (ref 4.8–5.6)
Mean Plasma Glucose: 120 mg/dL

## 2024-08-09 ENCOUNTER — Encounter (HOSPITAL_COMMUNITY): Payer: Self-pay | Admitting: Urology

## 2024-08-09 ENCOUNTER — Ambulatory Visit (HOSPITAL_COMMUNITY): Admitting: Anesthesiology

## 2024-08-09 ENCOUNTER — Encounter (HOSPITAL_COMMUNITY)
Admission: RE | Disposition: A | Discharge status: Home / Self Care | Payer: Self-pay | Source: Home / Self Care | Attending: Urology

## 2024-08-09 ENCOUNTER — Ambulatory Visit (HOSPITAL_COMMUNITY)
Admission: RE | Admit: 2024-08-09 | Discharge: 2024-08-09 | Disposition: A | Discharge status: Home / Self Care | Attending: Urology | Admitting: Urology

## 2024-08-09 ENCOUNTER — Ambulatory Visit (HOSPITAL_BASED_OUTPATIENT_CLINIC_OR_DEPARTMENT_OTHER): Admitting: Anesthesiology

## 2024-08-09 DIAGNOSIS — N210 Calculus in bladder: Secondary | ICD-10-CM | POA: Diagnosis present

## 2024-08-09 DIAGNOSIS — Z8249 Family history of ischemic heart disease and other diseases of the circulatory system: Secondary | ICD-10-CM | POA: Insufficient documentation

## 2024-08-09 DIAGNOSIS — I10 Essential (primary) hypertension: Secondary | ICD-10-CM | POA: Insufficient documentation

## 2024-08-09 DIAGNOSIS — Z853 Personal history of malignant neoplasm of breast: Secondary | ICD-10-CM | POA: Insufficient documentation

## 2024-08-09 DIAGNOSIS — M1990 Unspecified osteoarthritis, unspecified site: Secondary | ICD-10-CM | POA: Insufficient documentation

## 2024-08-09 DIAGNOSIS — E119 Type 2 diabetes mellitus without complications: Secondary | ICD-10-CM | POA: Diagnosis not present

## 2024-08-09 DIAGNOSIS — F419 Anxiety disorder, unspecified: Secondary | ICD-10-CM | POA: Insufficient documentation

## 2024-08-09 HISTORY — PX: HOLMIUM LASER APPLICATION: SHX5852

## 2024-08-09 HISTORY — PX: CYSTOSCOPY WITH LITHOLAPAXY: SHX1425

## 2024-08-09 LAB — GLUCOSE, CAPILLARY: Glucose-Capillary: 95 mg/dL (ref 70–99)

## 2024-08-09 SURGERY — CYSTOSCOPY, WITH BLADDER CALCULUS LITHOLAPAXY
Anesthesia: General | Site: Bladder

## 2024-08-09 MED ORDER — PROPOFOL 500 MG/50ML IV EMUL
INTRAVENOUS | Status: DC | PRN
  Administered 2024-08-09: 150 mg via INTRAVENOUS

## 2024-08-09 MED ORDER — WATER FOR IRRIGATION, STERILE IR SOLN
Status: DC | PRN
Start: 2024-08-09 — End: 2024-08-09
  Administered 2024-08-09: 500 mL

## 2024-08-09 MED ORDER — ORAL CARE MOUTH RINSE: 15.0000 mL | Freq: Once | OROMUCOSAL | Status: AC

## 2024-08-09 MED ORDER — LACTATED RINGERS IV SOLN: INTRAVENOUS | Status: DC

## 2024-08-09 MED ORDER — CHLORHEXIDINE GLUCONATE 0.12 % MT SOLN: 15.0000 mL | Freq: Once | OROMUCOSAL | Status: AC

## 2024-08-09 MED ORDER — FENTANYL CITRATE (PF) 100 MCG/2ML IJ SOLN
INTRAMUSCULAR | Status: DC | PRN
  Administered 2024-08-09: 100 ug via INTRAVENOUS

## 2024-08-09 MED ORDER — SODIUM CHLORIDE 0.9 % IR SOLN
Status: DC | PRN
Start: 2024-08-09 — End: 2024-08-09
  Administered 2024-08-09: 3000 mL

## 2024-08-09 MED ORDER — FENTANYL CITRATE (PF) 100 MCG/2ML IJ SOLN
INTRAMUSCULAR | Status: AC
  Filled 2024-08-09: qty 2

## 2024-08-09 MED ORDER — OXYCODONE HCL 5 MG PO TABS: 5.0000 mg | ORAL_TABLET | Freq: Once | ORAL | Status: DC | PRN

## 2024-08-09 MED ORDER — TRAMADOL HCL 50 MG PO TABS: 50.0000 mg | ORAL_TABLET | Freq: Four times a day (QID) | ORAL | 0 refills | Status: AC | PRN

## 2024-08-09 MED ORDER — FENTANYL CITRATE PF 50 MCG/ML IJ SOSY: 25.0000 ug | PREFILLED_SYRINGE | INTRAMUSCULAR | Status: DC | PRN

## 2024-08-09 MED ORDER — ACETAMINOPHEN 500 MG PO TABS
1000.0000 mg | ORAL_TABLET | Freq: Once | ORAL | Status: AC
  Administered 2024-08-09: 1000 mg via ORAL

## 2024-08-09 MED ORDER — MIDAZOLAM HCL 2 MG/2ML IJ SOLN
INTRAMUSCULAR | Status: DC | PRN
  Administered 2024-08-09: 2 mg via INTRAVENOUS

## 2024-08-09 MED ORDER — CEFAZOLIN SODIUM-DEXTROSE 2-4 GM/100ML-% IV SOLN
INTRAVENOUS | Status: AC
  Filled 2024-08-09: qty 100

## 2024-08-09 MED ORDER — MIDAZOLAM HCL 2 MG/2ML IJ SOLN
INTRAMUSCULAR | Status: AC
  Filled 2024-08-09: qty 2

## 2024-08-09 MED ORDER — ACETAMINOPHEN 160 MG/5ML PO SOLN
960.0000 mg | Freq: Once | ORAL | Status: AC
  Filled 2024-08-09: qty 30

## 2024-08-09 MED ORDER — CEFAZOLIN SODIUM-DEXTROSE 2-4 GM/100ML-% IV SOLN
2.0000 g | INTRAVENOUS | Status: AC
  Administered 2024-08-09: 2 g via INTRAVENOUS

## 2024-08-09 MED ORDER — CHLORHEXIDINE GLUCONATE 0.12 % MT SOLN
OROMUCOSAL | Status: AC
  Administered 2024-08-09: 15 mL via OROMUCOSAL
  Filled 2024-08-09: qty 15

## 2024-08-09 MED ORDER — SCOPOLAMINE 1 MG/3DAYS TD PT72
MEDICATED_PATCH | TRANSDERMAL | Status: AC
  Filled 2024-08-09: qty 1

## 2024-08-09 MED ORDER — SODIUM CHLORIDE 0.9 % IV SOLN: 12.5000 mg | INTRAVENOUS | Status: DC | PRN

## 2024-08-09 MED ORDER — OXYCODONE HCL 5 MG/5ML PO SOLN: 5.0000 mg | Freq: Once | ORAL | Status: DC | PRN

## 2024-08-09 MED ORDER — SCOPOLAMINE 1 MG/3DAYS TD PT72
1.0000 | MEDICATED_PATCH | Freq: Once | TRANSDERMAL | Status: DC
  Administered 2024-08-09: 1.5 mg via TRANSDERMAL

## 2024-08-09 MED ORDER — LIDOCAINE 2% (20 MG/ML) 5 ML SYRINGE
INTRAMUSCULAR | Status: DC | PRN
Start: 2024-08-09 — End: 2024-08-09
  Administered 2024-08-09: 80 mg via INTRAVENOUS

## 2024-08-09 MED ORDER — ACETAMINOPHEN 500 MG PO TABS
ORAL_TABLET | ORAL | Status: DC
Start: 2024-08-09 — End: 2024-08-09
  Filled 2024-08-09: qty 2

## 2024-08-09 SURGICAL SUPPLY — 14 items
BAG DRAIN URO TABLE W/ADPT NS (BAG) ×1 IMPLANT
BAG HAMPER (MISCELLANEOUS) ×1 IMPLANT
BAG URINE DRAIN 2000ML AR STRL (UROLOGICAL SUPPLIES) ×1 IMPLANT
GLOVE BIO SURGEON STRL SZ8 (GLOVE) ×1 IMPLANT
GLOVE BIOGEL PI IND STRL 7.0 (GLOVE) ×2 IMPLANT
GOWN STRL REUS W/TWL LRG LVL3 (GOWN DISPOSABLE) ×1 IMPLANT
GOWN STRL REUS W/TWL XL LVL3 (GOWN DISPOSABLE) ×1 IMPLANT
KIT TURNOVER CYSTO (KITS) ×1 IMPLANT
PACK CYSTO (CUSTOM PROCEDURE TRAY) ×1 IMPLANT
PAD ARMBOARD POSITIONER FOAM (MISCELLANEOUS) ×1 IMPLANT
POSITIONER HEAD 8X9X4 ADT (SOFTGOODS) ×1 IMPLANT
SOL .9 NS 3000ML IRR UROMATIC (IV SOLUTION) ×1 IMPLANT
TRACTIP FLEXIVA PULS ID 200XHI (Laser) IMPLANT
WATER STERILE IRR 500ML POUR (IV SOLUTION) ×1 IMPLANT

## 2024-08-09 NOTE — Anesthesia Postprocedure Evaluation (Signed)
 Anesthesia Post Note  Patient: Madeline Davidson  Procedure(s) Performed: CYSTOSCOPY, WITH BLADDER CALCULUS LITHOLAPAXY (Bladder) HOLMIUM LASER APPLICATION (Bladder)  Patient location during evaluation: PACU Anesthesia Type: General Level of consciousness: awake and alert Pain management: pain level controlled Vital Signs Assessment: post-procedure vital signs reviewed and stable Respiratory status: spontaneous breathing, nonlabored ventilation, respiratory function stable and patient connected to nasal cannula oxygen Cardiovascular status: blood pressure returned to baseline and stable Postop Assessment: no apparent nausea or vomiting Anesthetic complications: no   There were no known notable events for this encounter.   Last Vitals:  Vitals:   08/09/24 1430 08/09/24 1436  BP: 124/86 125/81  Pulse: 88 77  Resp: 19 (!) 21  Temp:  36.6 C  SpO2: 97% 97%    Last Pain:  Vitals:   08/09/24 1436  PainSc: 0-No pain                 Jaaziel Peatross L Meital Riehl

## 2024-08-09 NOTE — Anesthesia Preprocedure Evaluation (Addendum)
 Anesthesia Evaluation  Patient identified by MRN, date of birth, ID band Patient awake    Reviewed: Allergy & Precautions, H&P , NPO status , Patient's Chart, lab work & pertinent test results  History of Anesthesia Complications (+) PONVNegative for: history of anesthetic complications  Airway Mallampati: II  TM Distance: >3 FB Neck ROM: Full    Dental no notable dental hx. (+) Teeth Intact, Dental Advisory Given, Chipped   Pulmonary neg pulmonary ROS   Pulmonary exam normal breath sounds clear to auscultation       Cardiovascular Exercise Tolerance: Good hypertension, Normal cardiovascular exam Rhythm:Regular Rate:Normal     Neuro/Psych   Anxiety     negative neurological ROS  negative psych ROS   GI/Hepatic negative GI ROS, Neg liver ROS,,,  Endo/Other  diabetes, Type 2    Renal/GU negative Renal ROS  Female GU complaint     Musculoskeletal  (+) Arthritis , Osteoarthritis,    Abdominal  (+) + obese  Peds  Hematology   Anesthesia Other Findings Breast cancer  Septic shock  Reproductive/Obstetrics negative OB ROS                              Anesthesia Physical Anesthesia Plan  ASA: 2  Anesthesia Plan: General   Post-op Pain Management: Dilaudid  IV   Induction: Intravenous  PONV Risk Score and Plan: Ondansetron , Dexamethasone , Midazolam  and Scopolamine  patch - Pre-op  Airway Management Planned: LMA  Additional Equipment: None  Intra-op Plan:   Post-operative Plan:   Informed Consent: I have reviewed the patients History and Physical, chart, labs and discussed the procedure including the risks, benefits and alternatives for the proposed anesthesia with the patient or authorized representative who has indicated his/her understanding and acceptance.     Dental advisory given  Plan Discussed with: CRNA  Anesthesia Plan Comments:          Anesthesia Quick  Evaluation

## 2024-08-09 NOTE — H&P (Signed)
 HPI: Madeline Davidson is a 67yo here for cystolithalopaxy for a bladder calculus. She had an episode of gross painless hematuria 6 weeks ago. She was treated antibiotics. No culture results are available. 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 Those have since resolved. 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 / self palpated   Diverticulitis      history of   History of blood transfusion ~ 04/2014    related to chemo   Inguinal hernia      Bil   PONV (postoperative nausea and vomiting)      Pt denies   Septic shock (HCC)      from chemo in 2015   Wears glasses            Surgical History:      Past Surgical History:  Procedure Laterality Date   AXILLARY LYMPH NODE BIOPSY Right 12/2013   BREAST BIOPSY Right 12/2013   CHOLECYSTECTOMY   2005   COLON RESECTION   2004    for abscess on my colon   HERNIA REPAIR   2005    umb   MASTECTOMY W/ SENTINEL NODE BIOPSY Right 06/22/2014    Procedure: RIGHT TOTAL MASTECTOMY WITH SENTINEL LYMPH NODE BIOPSY;  Surgeon: Donnice Bury, MD;  Location: [facility] OR;  Service: General;  Laterality: Right;   PORTACATH PLACEMENT Left 01/31/2014    Procedure: INSERTION PORT-A-CATH;  Surgeon: Donnice Bury, MD;  Location: [facility] OR;  Service: General;  Laterality: Left;          Home Medications:  Allergies as of 05/26/2024         Reactions    Ciprofloxacin Other (See Comments)    Patient had a headache after taking Cipro. Made her sinus infection symptoms worse.            Medication List           Accurate as of May 26, 2024  1:38 PM. If you have any questions, ask your nurse or doctor.              acetaminophen  500 MG tablet Commonly known as:  TYLENOL  Take 1,000 mg by mouth every 6 (six) hours as needed for mild pain.    anastrozole  1 MG tablet Commonly known as: ARIMIDEX  Take 1 tablet (1 mg total) by mouth daily.    cetirizine 10 MG tablet Commonly known as: ZYRTEC Take 10 mg by mouth daily.    clonazePAM  0.5 MG tablet Commonly known as: KLONOPIN  Take 1 tablet (0.5 mg total) by mouth daily as needed for anxiety.    Fish Oil 500 MG Caps Take 1,000 mg by mouth.    ibuprofen  800 MG tablet Commonly known as: ADVIL  Take 1 tablet (800 mg total) by mouth every 8 (eight) hours as needed for moderate pain.    lisinopril  20 MG tablet Commonly known as: ZESTRIL  Take 20 mg by mouth daily.    VITAMIN B 12 PO Take 1,000 mcg by mouth once a week.    zolpidem  5 MG tablet Commonly known as: AMBIEN  Take 1 tablet (5  mg total) by mouth at bedtime as needed for sleep.             Allergies:  Allergies       Allergies  Allergen Reactions   Ciprofloxacin Other (See Comments)      Patient had a headache after taking Cipro. Made her sinus infection symptoms worse.        Family History:      Family History  Problem Relation Age of Onset   Cancer Father 68        colon cancer   Colon cancer Father     Hypertension Mother            Social History:  reports that she has never smoked. She has never used smokeless tobacco. She reports that she does not drink alcohol and does not use drugs.   ROS: All other review of systems were reviewed and are negative except what is noted above in HPI   Physical Exam: BP (!) 174/105   Pulse 98   Ht 5' 3 (1.6 m)   Wt 201 lb (91.2 kg)   BMI 35.61 kg/m   Constitutional:  Alert and oriented, No acute distress. HEENT: [place] AT, moist mucus membranes.  Trachea midline, no masses. Cardiovascular: No clubbing, cyanosis, or edema. Respiratory: Normal respiratory effort, no increased work of breathing. GI: Abdomen is soft, nontender, nondistended, no abdominal masses GU: No CVA  tenderness.  Lymph: No cervical or inguinal lymphadenopathy. Skin: No rashes, bruises or suspicious lesions. Neurologic: Grossly intact, no focal deficits, moving all 4 extremities. Psychiatric: Normal mood and affect.   Laboratory Data: Recent Labs       Lab Results  Component Value Date    WBC 8.9 12/08/2018    HGB 14.0 12/08/2018    HCT 44.6 12/08/2018    MCV 91.0 12/08/2018    PLT 265 12/08/2018        Recent Labs       Lab Results  Component Value Date    CREATININE 0.80 12/08/2018        Recent Labs  No results found for: PSA     Recent Labs  No results found for: TESTOSTERONE     Recent Labs  No results found for: HGBA1C     Urinalysis Labs (Brief)          Component Value Date/Time    LABSPEC 1.005 05/12/2015 0931    PHURINE 5.0 05/12/2015 0931    GLUCOSEU Negative 05/12/2015 0931    HGBUR Negative 05/12/2015 0931    BILIRUBINUR Negative 05/12/2015 0931    KETONESUR Negative 05/12/2015 0931    PROTEINUR Negative 05/12/2015 0931    UROBILINOGEN 0.2 05/12/2015 0931    NITRITE Negative 05/12/2015 0931    LEUKOCYTESUR Small 05/12/2015 0931        Recent Labs       Lab Results  Component Value Date    MUCUS Moderate 05/05/2014    BACTERIA Trace 05/12/2015        Pertinent Imaging:   No results found for this or any previous visit.   No results found for this or any previous visit.   No results found for this or any previous visit.   No results found for this or any previous visit.   No results found for this or any previous visit.   No results found for this or any previous visit.   No results found for this or any previous visit.   No results  found for this or any previous visit.     Assessment & Plan:     Bladder calculus The risks/benefits/alternatives to cystolithalopaxy was explained to the patient and she understands and wishes to proceed with surgery

## 2024-08-09 NOTE — Transfer of Care (Signed)
 Immediate Anesthesia Transfer of Care Note  Patient: Madeline Davidson  Procedure(s) Performed: CYSTOSCOPY, WITH BLADDER CALCULUS LITHOLAPAXY (Bladder) HOLMIUM LASER APPLICATION (Bladder)  Patient Location: PACU  Anesthesia Type:General  Level of Consciousness: awake, alert , oriented, and patient cooperative  Airway & Oxygen Therapy: Patient Spontanous Breathing  Post-op Assessment: Report given to RN, Post -op Vital signs reviewed and stable, and Patient moving all extremities X 4  Post vital signs: Reviewed and stable  Last Vitals:  Vitals Value Taken Time  BP 124/77 1420  Temp    Pulse 91 08/09/24 14:20  Resp 19 08/09/24 14:20  SpO2 94 % 08/09/24 14:20  Vitals shown include unfiled device data.  Last Pain:  Vitals:   08/09/24 1246  PainSc: 0-No pain         Complications: No notable events documented.

## 2024-08-09 NOTE — Anesthesia Procedure Notes (Signed)
 Procedure Name: LMA Insertion Date/Time: 08/09/2024 1:54 PM  Performed by: Cordella Elvie HERO, CRNAPre-anesthesia Checklist: Patient identified, Emergency Drugs available, Suction available, Patient being monitored and Timeout performed Patient Re-evaluated:Patient Re-evaluated prior to induction Oxygen Delivery Method: Circle system utilized Preoxygenation: Pre-oxygenation with 100% oxygen Induction Type: IV induction LMA: LMA inserted LMA Size: 4.0 Number of attempts: 1 Placement Confirmation: positive ETCO2, CO2 detector and breath sounds checked- equal and bilateral Tube secured with: Tape Dental Injury: Teeth and Oropharynx as per pre-operative assessment

## 2024-08-09 NOTE — Op Note (Signed)
 Preoperative diagnosis: bladder calculus  Postoperative diagnosis: same  Procedure: 1 cystoscopy 2. cystolithalopaxy for a stone less than 2.5cm  Attending: Belvie Clara  Anesthesia: General  Estimated blood loss: Minimal  Drains: none  Specimens: 1. Bladder calculus  Antibiotics: ancef   Findings:  Ureteral orifices in normal anatomic location.  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 She developed gross hematuria and on office cystoscopy was found to have a bladder calculus.  After discussing treatment options, they decided proceed with cystolithalopaxy.  Procedure in detail: The patient was brought to the operating room and a brief timeout was done to ensure correct patient, correct procedure, correct site.  General anesthesia was administered patient was placed in dorsal lithotomy position.  Their genitalia was then prepped and draped in usual sterile fashion.  A rigid 22 French cystoscope was passed in the urethra and the bladder.  Bladder was inspected and we noted no masses or lesions.  the ureteral orifices were in the normal orthotopic locations. Using the 1000nm laser fiber the bladder calculus was fragmented and the fragments were then irrigated from the bladder. The stone fragments were the sent for analysis. The bladder was then drained and this concluded the procedure which was well tolerated by patient.  Complications: None  Condition: Stable, extubated, transferred to PACU  Plan: Patient is to be discharge home. She will followup in 2 weeks for stone analysis discussion

## 2024-08-10 ENCOUNTER — Encounter (HOSPITAL_COMMUNITY): Payer: Self-pay | Admitting: Urology

## 2024-08-24 ENCOUNTER — Ambulatory Visit: Admitting: Urology

## 2024-08-27 LAB — STONE ANALYSIS
Calcium Oxalate Monohydrate: 10 %
Calcium Phosphate (Carbonate): 10 %
Uric Acid Calculi: 80 %
Weight Calculi: 65 mg

## 2024-09-08 ENCOUNTER — Ambulatory Visit (INDEPENDENT_AMBULATORY_CARE_PROVIDER_SITE_OTHER): Admitting: Urology

## 2024-09-08 VITALS — BP 154/84 | HR 98

## 2024-09-08 DIAGNOSIS — N210 Calculus in bladder: Secondary | ICD-10-CM | POA: Diagnosis not present

## 2024-09-08 LAB — URINALYSIS, ROUTINE W REFLEX MICROSCOPIC
Bilirubin, UA: NEGATIVE
Glucose, UA: NEGATIVE
Ketones, UA: NEGATIVE
Nitrite, UA: NEGATIVE
Protein,UA: NEGATIVE
Specific Gravity, UA: 1.025 (ref 1.005–1.030)
Urobilinogen, Ur: 0.2 mg/dL (ref 0.2–1.0)
pH, UA: 6 (ref 5.0–7.5)

## 2024-09-08 LAB — MICROSCOPIC EXAMINATION
Bacteria, UA: NONE SEEN
RBC, Urine: >30 /HPF — AB (ref 0–2)

## 2024-09-08 NOTE — Progress Notes (Signed)
 09/08/2024 11:20 AM   Madeline Davidson [birth year] [id number]  Referring provider: Almeda Loa ORN, FNP [address] [place],  [place] [address]  Followup bladder stone   HPI: [name] is a 31bn here for followup for a bladder calculus. Stone composition 80% uric acid. She is doing well with no significant LUTS.    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 / self palpated   Diabetes mellitus without complication (HCC)    Diverticulitis    history of   History of blood transfusion ~ 04/2014   related to chemo   Inguinal hernia    Bil   Septic shock (HCC)    from chemo in 2015   Wears glasses     Surgical History: Past Surgical History:  Procedure Laterality Date   AXILLARY LYMPH NODE BIOPSY Right 12/23/2013   BREAST BIOPSY Right 12/23/2013   CHOLECYSTECTOMY  12/24/2003   COLON RESECTION  12/23/2002   for abscess on my colon   CYSTOSCOPY WITH LITHOLAPAXY N/A 08/09/2024   Procedure: CYSTOSCOPY, WITH BLADDER CALCULUS LITHOLAPAXY;  Surgeon: Sherrilee Belvie CROME, MD;  Location: [facility];  Service: Urology;  Laterality: N/A;   HERNIA REPAIR  12/24/2003   umb   HOLMIUM LASER APPLICATION N/A 08/09/2024   Procedure: HOLMIUM LASER APPLICATION;  Surgeon: Sherrilee Belvie CROME, MD;  Location: [facility];  Service: Urology;  Laterality: N/A;   MASTECTOMY W/ SENTINEL NODE BIOPSY Right 06/22/2014   Procedure: RIGHT TOTAL MASTECTOMY WITH SENTINEL LYMPH NODE BIOPSY;  Surgeon: Donnice Bury, MD;  Location: [facility];  Service: General;  Laterality: Right;   PORT-A-CATH REMOVAL     PORTACATH PLACEMENT Left 01/31/2014   Procedure: INSERTION PORT-A-CATH;  Surgeon: Donnice Bury, MD;  Location: [facility];  Service: General;  Laterality: Left;    Home Medications:  Allergies as of 09/08/2024       Reactions   Ciprofloxacin  Other (See Comments)   Patient had a headache after taking Cipro. Made her sinus infection symptoms worse.        Medication List        Accurate as of September 08, 2024 11:20 AM. If you have any questions, ask your nurse or doctor.          acetaminophen  500 MG tablet Commonly known as: TYLENOL  Take 1,000 mg by mouth every 6 (six) hours as needed for mild pain.   Align 10 MG Caps Take 10 mg by mouth as needed (diarrhea).   amLODipine 2.5 MG tablet Commonly known as: NORVASC Take 2.5 mg by mouth daily.   anastrozole  1 MG tablet Commonly known as: ARIMIDEX  Take 1 tablet (1 mg total) by mouth daily.   cetirizine 10 MG tablet Commonly known as: ZYRTEC Take 10 mg by mouth daily.   clonazePAM  0.5 MG tablet Commonly known as: KLONOPIN  Take 1 tablet (0.5 mg total) by mouth daily as needed for anxiety.   fexofenadine 30 MG disintegrating tablet Commonly known as: ALLEGRA ODT Take 30 mg by mouth daily.   Fish Oil 500 MG Caps Take 1,000 mg by mouth.   gabapentin 100 MG capsule Commonly known as: NEURONTIN Take 100 mg by mouth 2 (two) times daily.   ibuprofen  800 MG tablet Commonly known as: ADVIL  Take 1 tablet (800 mg total) by mouth every  8 (eight) hours as needed for moderate pain.   lisinopril  20 MG tablet Commonly known as: ZESTRIL  Take 40 mg by mouth daily.   metFORMIN 500 MG tablet Commonly known as: GLUCOPHAGE Take 500 mg by mouth 2 (two) times daily with a meal.   traMADol  50 MG tablet Commonly known as: Ultram  Take 1 tablet (50 mg total) by mouth every 6 (six) hours as needed.   VITAMIN B 12 PO Take 1,000 mcg by mouth once a week.   Vitamin D  (Ergocalciferol ) 1.25 MG (50000 UNIT) Caps capsule Commonly known as: DRISDOL  Take 50,000 Units by mouth every 7 (seven) days.   zolpidem  5 MG tablet Commonly known as: AMBIEN  Take 1 tablet (5 mg total) by mouth at bedtime as needed for sleep.        Allergies:  Allergies  Allergen Reactions    Ciprofloxacin Other (See Comments)    Patient had a headache after taking Cipro. Made her sinus infection symptoms worse.    Family History: Family History  Problem Relation Age of Onset   Cancer Father 36       colon cancer   Colon cancer Father    Hypertension Mother     Social History:  reports that she has never smoked. She has never used smokeless tobacco. She reports that she does not drink alcohol and does not use drugs.  ROS: All other review of systems were reviewed and are negative except what is noted above in HPI  Physical Exam: BP (!) 154/84   Pulse 98   Constitutional:  Alert and oriented, No acute distress. HEENT: [place] AT, moist mucus membranes.  Trachea midline, no masses. Cardiovascular: No clubbing, cyanosis, or edema. Respiratory: Normal respiratory effort, no increased work of breathing. GI: Abdomen is soft, nontender, nondistended, no abdominal masses GU: No CVA tenderness.  Lymph: No cervical or inguinal lymphadenopathy. Skin: No rashes, bruises or suspicious lesions. Neurologic: Grossly intact, no focal deficits, moving all 4 extremities. Psychiatric: Normal mood and affect.  Laboratory Data: Lab Results  Component Value Date   WBC 6.2 08/06/2024   HGB 13.5 08/06/2024   HCT 41.3 08/06/2024   MCV 90.0 08/06/2024   PLT 208 08/06/2024    Lab Results  Component Value Date   CREATININE 0.67 08/06/2024    No results found for: PSA  No results found for: TESTOSTERONE  Lab Results  Component Value Date   HGBA1C 5.8 (H) 08/06/2024    Urinalysis    Component Value Date/Time   APPEARANCEUR Clear 07/30/2024 0830   LABSPEC 1.005 05/12/2015 0931   PHURINE 5.0 05/12/2015 0931   GLUCOSEU Negative 07/30/2024 0830   GLUCOSEU Negative 05/12/2015 0931   HGBUR Negative 05/12/2015 0931   BILIRUBINUR Negative 07/30/2024 0830   BILIRUBINUR Negative 05/12/2015 0931   KETONESUR Negative 05/12/2015 0931   PROTEINUR Negative 07/30/2024 0830    PROTEINUR Negative 05/12/2015 0931   UROBILINOGEN 0.2 05/12/2015 0931   NITRITE Negative 07/30/2024 0830   NITRITE Negative 05/12/2015 0931   LEUKOCYTESUR Negative 07/30/2024 0830   LEUKOCYTESUR Small 05/12/2015 0931    Lab Results  Component Value Date   LABMICR See below: 07/30/2024   WBCUA 0-5 07/30/2024   LABEPIT 0-10 07/30/2024   MUCUS Moderate 05/05/2014   BACTERIA None seen 07/30/2024    Pertinent Imaging: *** No results found for this or any previous visit.  No results found for this or any previous visit.  No results found for this or any previous visit.  No results found  for this or any previous visit.  No results found for this or any previous visit.  No results found for this or any previous visit.  Results for orders placed in visit on 05/26/24  CT HEMATURIA WORKUP  Narrative CLINICAL DATA:  Gross hematuria for 6 weeks. History of right breast cancer.  EXAM: CT ABDOMEN AND PELVIS WITHOUT AND WITH CONTRAST  TECHNIQUE: Multidetector CT imaging of the abdomen and pelvis was performed following the standard protocol before and following the bolus administration of intravenous contrast.  RADIATION DOSE REDUCTION: This exam was performed according to the departmental dose-optimization program which includes automated exposure control, adjustment of the mA and/or kV according to patient size and/or use of iterative reconstruction technique.  CONTRAST:  OMNIPAQUE  IOHEXOL  300 MG/ML  SOLN  COMPARISON:  12/08/2018.  FINDINGS: Lower chest: Clear lung bases. Normal heart size without pericardial or pleural effusion.  Hepatobiliary: Irregular hepatic capsule. No suspicious liver lesion. Cholecystectomy. Pneumobilia. No biliary duct dilatation.  Pancreas: Normal, without mass or ductal dilatation.  Spleen: Normal in size, without focal abnormality.  Adrenals/Urinary Tract: Normal adrenal glands. Right renal collecting system calculi of up to the  6 mm. No hydroureter or ureteric calculi. No bladder calculi.  Bilateral too small to characterize renal lesions of maximally 2-3 mm are most likely cysts and do not warrant specific imaging follow-up. Good renal collecting system opacification on delayed images. Portions of the ureters are incompletely opacified on delayed images. No filling defects in the opacified collecting systems or ureters. No enhancing bladder mass or filling defect on delayed images.  Stomach/Bowel: Normal stomach, without wall thickening. Again identified are complex abdominopelvic wall hernias. The largest hernia is in the right pelvis and contains a large portion of the right-sided colon and small bowel. Smaller upper abdominal right paramidline hernias contain fat and nonobstructive transverse colon.  Vascular/Lymphatic: Aortic atherosclerosis. No abdominopelvic adenopathy.  Reproductive: Normal uterus and adnexa.  Other: No significant free fluid. No free intraperitoneal air. A 4 mm right abdominal peritoneal/omental nodule on 39/6 is present relatively similar in 2019, favoring a benign etiology.  Musculoskeletal: Bilateral hip osteoarthritis.  IMPRESSION: 1. Right nephrolithiasis.  No other explanation for hematuria. 2. Irregular hepatic capsule, suspicious for cirrhosis. 3. Right mastectomy, without metastatic disease. 4. Complex abdominopelvic wall hernias containing nonobstructive large and small bowel, relatively similar to 2019. 5.  Aortic Atherosclerosis (ICD10-I70.0).   Electronically Signed By: Rockey Kilts M.D. On: 06/17/2024 12:56  No results found for this or any previous visit.   Assessment & Plan:    1. Calculus of bladder (Primary) Folowup 3-4 months for cystoscopy.  - Urinalysis, Routine w reflex microscopic   No follow-ups on file.  Belvie Clara, MD  [facility] [place]

## 2024-09-14 ENCOUNTER — Encounter: Payer: Self-pay | Admitting: Urology

## 2024-09-14 NOTE — Patient Instructions (Signed)
Bladder Stone  A bladder stone is a buildup of crystals made from the proteins and minerals found in urine. These substances build up when urine becomes too concentrated. Urine is concentrated when there is less water and more proteins and minerals in it. Bladder stones usually develop when a person has another medical condition that prevents the bladder from emptying completely. Crystals can form in the small amount of urine that is left in the bladder. Bladder stones that grow large can become painful and may block the flow of urine. What are the causes? This condition may be caused by: An enlarged prostate, which prevents the bladder from emptying well. An infection of a part of your urinary system (urinary tract infection, or UTI). This includes the: Kidneys. Bladder. Ureters. These are the tubes that carry urine to your bladder. Urethra. This is the tube that drains urine from your bladder. A weak spot in the bladder that creates a small pouch (bladder diverticulum). Nerve damage that may interfere with the signals from your brain to your bladder muscles (neurogenic bladder). This can result from conditions such as Parkinson's disease or spinal cord injuries. What increases the risk? This condition is more likely to develop in people who: Get frequent UTIs. Have another medical condition that affects the bladder. Have a history of bladder surgery. Have a spinal cord injury. Have an abnormal shape of the bladder (deformity). What are the signs or symptoms? Common symptoms of this condition include: Pain in the abdomen. A need to urinate more often. Difficulty or pain when urinating. Blood in the urine. Cloudy urine or urine that is dark in color. Pain in the penis or testicles in men. Small bladder stones do not always cause symptoms. How is this diagnosed? This condition may be diagnosed based on your symptoms, medical history, and physical exam. The physical exam will check for  tenderness in your abdomen. For men, an exam in the rectum may be done to check the prostate gland. You may have tests, such as: A urine test (urinalysis). A urine sample test to check for other infections (culture). Blood tests, including tests to look for a certain substance (creatinine). A creatinine level that is higher than normal could indicate a blockage. A procedure to check your bladder using a scope with a camera (cystoscopy). You may also have imaging studies, such as: CT scan or ultrasound of your abdomen and the area between your hip bones (pelvis or pelvic area). An X-ray of your urinary system. How is this treated? This condition may be treated with: Cystolitholapaxy. This procedure uses a laser, ultrasound, or other device to break the stone into smaller pieces. Fluids are used to flush the small pieces from the area. Surgery to remove the stone. A stent. This is a small mesh tube that is threaded into your ureter to make urine flow. Medicines to treat pain. Follow these instructions at home: Medicines Take over-the-counter and prescription medicines only as told by your health care provider. Ask your health care provider if the medicine prescribed to you: Requires you to avoid driving or using heavy machinery. Can cause constipation. You may need to take these actions to prevent or treat constipation: Take over-the-counter or prescription medicines. Eat foods that are high in fiber, such as beans, whole grains, and fresh fruits and vegetables. Limit foods that are high in fat and processed sugars, such as fried or sweet foods. Alcohol use Do not drink alcohol if: Your health care provider tells you not to  drink. You are pregnant, may be pregnant, or are planning to become pregnant. If you drink alcohol: Limit how much you drink to: 0-1 drink a day for women. 0-2 drinks a day for men. Be aware of how much alcohol is in your drink. In the [place] one drink equals one 12  oz bottle of beer (355 mL), one 5 oz glass of wine (148 mL), or one 1 oz glass of hard liquor (44 mL). Activity Rest as told by your health care provider. Return to your normal activities as told by your health care provider. Ask your health care provider what activities are safe for you. General instructions  Drink enough fluid to keep your urine pale yellow. Tell your health care provider about any unusual symptoms related to urinating. Early diagnosis of an enlarged prostate and other bladder conditions may reduce your risk of getting bladder stones. Do not use any products that contain nicotine or tobacco, such as cigarettes, e-cigarettes, or chewing tobacco. If you need help quitting, ask your health care provider. Do not use drugs. Where to find more information [name]): www.[facility].org Contact a health care provider if you: Have a fever. Feel nauseous or vomit. Are unable to urinate. Have a large amount of blood in your urine. Get help right away if you: Have severe back pain or pain in the lower part of your abdomen. Cannot eat or drink without vomiting. Vomit after taking your medicine. Summary A bladder stone is a buildup of crystals made from the proteins and minerals found in urine. These substances build up when urine becomes too concentrated. Bladder stones that grow large can become painful and may block the flow of urine. Bladder stones may be treated with a laser, a stent, surgery, or pain medicines. This information is not intended to replace advice given to you by your health care provider. Make sure you discuss any questions you have with your health care provider. Document Revised: 11/11/2022 Document Reviewed: 11/11/2022 Elsevier Patient Education  2024 [employer].

## 2024-12-08 ENCOUNTER — Ambulatory Visit (INDEPENDENT_AMBULATORY_CARE_PROVIDER_SITE_OTHER): Admitting: Urology

## 2024-12-08 VITALS — BP 155/76 | HR 109

## 2024-12-08 DIAGNOSIS — N210 Calculus in bladder: Secondary | ICD-10-CM | POA: Diagnosis not present

## 2024-12-08 DIAGNOSIS — R319 Hematuria, unspecified: Secondary | ICD-10-CM

## 2024-12-08 LAB — URINALYSIS, ROUTINE W REFLEX MICROSCOPIC
Bilirubin, UA: NEGATIVE
Glucose, UA: NEGATIVE
Ketones, UA: NEGATIVE
Leukocytes,UA: NEGATIVE
Nitrite, UA: NEGATIVE
Protein,UA: NEGATIVE
Specific Gravity, UA: 1.03 (ref 1.005–1.030)
Urobilinogen, Ur: 0.2 mg/dL (ref 0.2–1.0)
pH, UA: 5.5 (ref 5.0–7.5)

## 2024-12-08 LAB — MICROSCOPIC EXAMINATION
Bacteria, UA: NONE SEEN
WBC, UA: NONE SEEN /HPF (ref 0–5)

## 2024-12-08 MED ORDER — CEPHALEXIN 500 MG PO CAPS
500.0000 mg | ORAL_CAPSULE | Freq: Once | ORAL | Status: AC
  Administered 2024-12-08: 15:00:00 500 mg via ORAL

## 2024-12-08 NOTE — Progress Notes (Signed)
" ° °  12/08/2024  CC: bladder calculus  HPI:  Blood pressure (!) 155/76, pulse (!) 109. NED. A&Ox3.   No respiratory distress   Abd soft, NT, ND Normal external genitalia with patent urethral meatus  Cystoscopy Procedure Note  Patient identification was confirmed, informed consent was obtained, and patient was prepped using Betadine solution.  Lidocaine  jelly was administered per urethral meatus.    Procedure: - Flexible cystoscope introduced, without any difficulty.   - Thorough search of the bladder revealed:    normal urethral meatus    normal urothelium    no stones    no ulcers     no tumors    no urethral polyps    no trabeculation  - Ureteral orifices were normal in position and appearance.  Post-Procedure: - Patient tolerated the procedure well  Assessment/ Plan: Followup 6 months for cystoscopy   No follow-ups on file.  Belvie Clara, MD  "

## 2024-12-27 ENCOUNTER — Encounter: Payer: Self-pay | Admitting: Urology

## 2025-06-29 ENCOUNTER — Other Ambulatory Visit: Admitting: Urology
